# Patient Record
Sex: Male | Born: 1950 | Race: White | Hispanic: No | Marital: Married | Smoking: Former smoker
Health system: Southern US, Community
[De-identification: ages and names within clinical notes are randomized; demographics above are authoritative.]

## PROBLEM LIST (undated history)

## (undated) DIAGNOSIS — K4020 Bilateral inguinal hernia, without obstruction or gangrene, not specified as recurrent: Secondary | ICD-10-CM

## (undated) DIAGNOSIS — I10 Essential (primary) hypertension: Secondary | ICD-10-CM

## (undated) HISTORY — PX: APPENDECTOMY: SHX54

## (undated) HISTORY — PX: DENTAL SURGERY: SHX609

## (undated) HISTORY — DX: Essential (primary) hypertension: I10

---

## 2002-04-02 ENCOUNTER — Emergency Department (HOSPITAL_COMMUNITY): Admission: EM | Admit: 2002-04-02 | Discharge: 2002-04-02 | Payer: Self-pay | Admitting: Emergency Medicine

## 2006-08-28 ENCOUNTER — Ambulatory Visit: Payer: Self-pay | Admitting: Sports Medicine

## 2010-01-25 ENCOUNTER — Ambulatory Visit: Payer: Self-pay | Admitting: Sports Medicine

## 2010-01-25 DIAGNOSIS — K4020 Bilateral inguinal hernia, without obstruction or gangrene, not specified as recurrent: Secondary | ICD-10-CM

## 2010-01-25 DIAGNOSIS — N529 Male erectile dysfunction, unspecified: Secondary | ICD-10-CM

## 2010-10-10 NOTE — Letter (Signed)
Summary: Referral for Dr Ezzard Standing  Referral for Dr Ezzard Standing   Imported By: Marily Memos 01/26/2010 09:05:02  _____________________________________________________________________  External Attachment:    Type:   Image     Comment:   External Document

## 2010-10-10 NOTE — Letter (Signed)
Summary: *Referral Letter  [facility]  [address]   [place], [place] [address]   Phone: [phone]  Fax: [phone]    01/25/2010 Dr. Ovidio Kin [facility] fax: [phone]   Dear Onalee Hua:  Thank you in advance for agreeing to see my patient:  Bryan Harmon [address] [place], [place]  [address]  Phone: [phone]  Reason for Referral: pleasant gentleman with chronic bilateral inguinal hernias that are starting to bother him with exercise.  Runs marathons.  Procedures Requested: I think he is a good surgical candidate and wish your opinion.  Current Medical Problems: 1)  ERECTILE DYSFUNCTION, ORGANIC (ICD-607.84) 2)  INGUINAL HERNIA, RIGHT (ICD-550.90) 3)  INGUINAL HERNIA, LEFT (ICD-550.90)   Current Medications: 1)  VIAGRA 50 MG TABS (SILDENAFIL CITRATE) 1 by mouth   Thank you again for agreeing to see our patient; please contact us if you have any further questions or need additional information.  Sincerely,  Vincent Gros MD

## 2010-10-10 NOTE — Assessment & Plan Note (Signed)
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 for achilles tendon probs and did well  has had bilateral hernias at least 10 years hernias bother him w some activity running now less than in past - was over 50 mpw now 20 to 30 mpw still doing marathons  no pain w tennis  biking no real pain  also concerned about ED tuis has ben worse in recent years no medical problems that affect this no prescription meds  Allergies (verified): No Known Drug Allergies  Physical Exam  General:  Well-developed,well-nourished,in no acute distress; alert,appropriate and cooperative throughout examination Abdomen:  he has bilat inguinal hernias these bulge to level of inguinal ring they reduce spontaneously with lying down but are fairly prominent  testicles normal and no swelling into scrotum Msk:  Both Achilles tendons are now without nodules   Impression & Recommendations:  Problem # 1:  INGUINAL HERNIA, RIGHT (ICD-550.90) not painful but now problematic with increased running and sports activity  Problem # 2:  INGUINAL HERNIA, LEFT (ICD-550.90) this one is larger and more symptoms if too much activity  will refer for surgical eval to Dr Ezzard Standing  Problem # 3:  ERECTILE DYSFUNCTION, ORGANIC (ICD-607.84)  His updated medication list for this problem includes:    Viagra 50 Mg Tabs (Sildenafil citrate) .Marland Kitchen... 1 by mouth   I think this is consistent w aging probably not related to his hernia issues  OK to try viagra  Complete Medication List: 1)  Viagra 50 Mg Tabs (Sildenafil citrate) .Marland Kitchen.. 1 by mouth Prescriptions: VIAGRA 50 MG TABS (SILDENAFIL CITRATE) 1 by mouth  #6 x 11   Entered and Authorized  by:   Enid Baas MD   Signed by:   Enid Baas MD on 01/25/2010   Method used:   Electronically to        [facility] [address] (retail)       [address]       [place], [place]  [address]       Ph: [phone] or [phone]       Fax: [phone]   RxID:   [id number]   Appended Document: Lyndal Rainbow with Dr Ezzard Standing is 6/22 @ 10:55,must arrive by 10:30 [facility]  [phone] Mssg left for pt

## 2011-02-03 ENCOUNTER — Emergency Department (HOSPITAL_COMMUNITY)
Admission: EM | Admit: 2011-02-03 | Discharge: 2011-02-03 | Discharge status: Against Medical Advice | Payer: 59 | Attending: Emergency Medicine | Admitting: Emergency Medicine

## 2011-02-03 DIAGNOSIS — S81009A Unspecified open wound, unspecified knee, initial encounter: Secondary | ICD-10-CM | POA: Insufficient documentation

## 2011-02-03 DIAGNOSIS — W298XXA Contact with other powered powered hand tools and household machinery, initial encounter: Secondary | ICD-10-CM | POA: Insufficient documentation

## 2011-02-03 DIAGNOSIS — S91009A Unspecified open wound, unspecified ankle, initial encounter: Secondary | ICD-10-CM | POA: Insufficient documentation

## 2011-04-15 ENCOUNTER — Other Ambulatory Visit: Payer: Self-pay | Admitting: Sports Medicine

## 2012-01-23 ENCOUNTER — Other Ambulatory Visit: Payer: Self-pay | Admitting: Sports Medicine

## 2012-02-27 ENCOUNTER — Encounter: Payer: Self-pay | Admitting: Family Medicine

## 2012-02-27 ENCOUNTER — Ambulatory Visit (INDEPENDENT_AMBULATORY_CARE_PROVIDER_SITE_OTHER): Payer: 59 | Admitting: Family Medicine

## 2012-02-27 VITALS — BP 220/114 | HR 52 | Ht 70.5 in | Wt 198.0 lb

## 2012-02-27 DIAGNOSIS — I10 Essential (primary) hypertension: Secondary | ICD-10-CM

## 2012-02-27 DIAGNOSIS — K4090 Unilateral inguinal hernia, without obstruction or gangrene, not specified as recurrent: Secondary | ICD-10-CM

## 2012-02-27 DIAGNOSIS — N529 Male erectile dysfunction, unspecified: Secondary | ICD-10-CM

## 2012-02-27 MED ORDER — SILDENAFIL CITRATE 100 MG PO TABS: 50.0000 mg | ORAL_TABLET | ORAL | Status: DC | PRN

## 2012-02-27 NOTE — Progress Notes (Signed)
Chief Complaint  Patient presents with  . Groin Pain    patient states that he has an inguinal hernia and does not it repaired just looking for an alternative.   HPI: He was seeing Dr. Electa Sniff, diagnosed with bilateral inguinal hernias and was referred to Dr. Ezzard Standing.  Saw him about 2 years ago.  Surgery was recommended, but he declined due to the use of mesh. He doesn't want mesh in his body.  He continues to run, and plays tennis. He is an Midwife. Has some discomfort with standing, and after eating.  No pain during or after running.  Never has "pain" just discomfort. He denies nausea, vomiting, bowel changes, urinary complaints.  ED--trouble maintaining erections.  He has taken Viagra in the past, and is asking for a refill. He states that the 50mg  wasn't all that effective, and asks for the 100mg  tablet.  He has a BP monitor at home, recalls it being "a little high", 145 for average systolic blood pressure when he has checked it.  History reviewed. No pertinent past medical history.  Past Surgical History  Procedure Date  . Appendectomy age 61    History   Social History  . Marital Status: Married    Spouse Name: N/A    Number of Children: 1  . Years of Education: N/A   Occupational History  . physical chemist [employer]   Social History Main Topics  . Smoking status: Former Smoker    Quit date: 09/10/1996  . Smokeless tobacco: Never Used  . Alcohol Use: Yes     1 drink per week.  . Drug Use: No  . Sexually Active: Not on file   Other Topics Concern  . Not on file   Social History Narrative   Married.  Son lives in [place].    Family History  Problem Relation Age of Onset  . Hypertension Maternal Grandmother   . Cancer Neg Hx   . Heart disease Neg Hx     Current outpatient prescriptions:sildenafil (VIAGRA) 100 MG tablet, Take 0.5-1 tablets (50-100 mg total) by mouth as needed for erectile dysfunction., Disp: 6 tablet, Rfl: 5;  DISCONTD:  VIAGRA 50 MG tablet, TAKE ONE TABLET BY MOUTH AS NEEDED, Disp: 6 tablet, Rfl: 5  No Known Allergies  ROS:  Rare headaches.  Denies dizziness, URI symptoms, fever, cough, shortness of breath, edema, nausea, vomiting, diarrhea, skin rash.  Denies chest pain, palpitations.  PHYSICAL EXAM: BP 220/114  Pulse 52  Ht 5' 10.5" (1.791 m)  Wt 198 lb (89.812 kg)  BMI 28.01 kg/m2 220/114 on repeat by MD, RA Well developed, pleasant male, in no distress HEENT:  PERRL, EOMI, conjunctiva clear.   Neck: no lymphadenopathy, thyromegaly or mass.  No carotid bruit Heart: regular rate and rhythm without murmur Lungs: clear bilaterally Back: no spine or CVA tenderness Abdomen: soft, nontender, normal bowel sounds, no organomegaly or mass Extremities: no edema Skin: no rash GU exam: testicles normal, no mass.  In supine position, no swelling/mass. When standing, bilateral hernias, obvious with just standing, easily reducible when supine. Nontender  ASSESSMENT/PLAN: 1. Inguinal hernia without mention of obstruction or gangrene, unilateral or unspecified, (not specified as recurrent)    2. Essential hypertension, benign  CBC with Differential, Lipid panel, Comprehensive metabolic panel, PSA  3. ERECTILE DYSFUNCTION, ORGANIC  sildenafil (VIAGRA) 100 MG tablet   Hernias--discussed that surgery is elective at this point, given lack of strangulation or significant symptoms, but is recommended to be done.  He is concerned about the use of mesh--we discussed reasons for using, and need to discuss his concerns with surgeon.  Advised that there really is no alternative treatment.  Educated regarding signs/symptoms of emergency/strangulation.  High blood pressure. Much higher today than what he reports at home. Advised to check blood pressures at home, write down on a piece of paper, and bring paper AND blood pressure monitor to follow-up visit.   Low sodium diet handout given.  ED--discussed potential increase in  BP with physical/sexual activity, and to monitor his BP prior to use, avoid if BP is as high as it is today. May try at just 1/2 tablet, increasing to full tablet only if poor response to 50mg   Encouraged him to return in 2-3 weeks to f/u on blood pressure, and to get fasting labs prior.  It is recommended that he set up CPE, given his lack of medical care in recent years.

## 2012-02-27 NOTE — Patient Instructions (Addendum)
Check blood pressures at home, write down on a piece of paper, and bring paper AND blood pressure monitor to follow-up visit.   Low sodium diet  2 Gram Low Sodium Diet A 2 gram sodium diet restricts the amount of sodium in the diet to no more than 2 g or 2000 mg daily. Limiting the amount of sodium is often used to help lower blood pressure. It is important if you have heart, liver, or kidney problems. Many foods contain sodium for flavor and sometimes as a preservative. When the amount of sodium in a diet needs to be low, it is important to know what to look for when choosing foods and drinks. The following includes some information and guidelines to help make it easier for you to adapt to a low sodium diet. QUICK TIPS  Do not add salt to food.   Avoid convenience items and fast food.   Choose unsalted snack foods.   Buy lower sodium products, often labeled as "lower sodium" or "no salt added."   Check food labels to learn how much sodium is in 1 serving.   When eating at a restaurant, ask that your food be prepared with less salt or none, if possible.  READING FOOD LABELS FOR SODIUM INFORMATION The nutrition facts label is a good place to find how much sodium is in foods. Look for products with no more than 500 to 600 mg of sodium per meal and no more than 150 mg per serving. Remember that 2 g = 2000 mg. The food label may also list foods as:  Sodium-free: Less than 5 mg in a serving.   Very low sodium: 35 mg or less in a serving.   Low-sodium: 140 mg or less in a serving.   Light in sodium: 50% less sodium in a serving. For example, if a food that usually has 300 mg of sodium is changed to become light in sodium, it will have 150 mg of sodium.   Reduced sodium: 25% less sodium in a serving. For example, if a food that usually has 400 mg of sodium is changed to reduced sodium, it will have 300 mg of sodium.  CHOOSING FOODS Grains  Avoid: Salted crackers and snack items. Some  cereals, including instant hot cereals. Bread stuffing and biscuit mixes. Seasoned rice or pasta mixes.   Choose: Unsalted snack items. Low-sodium cereals, oats, puffed wheat and rice, shredded wheat. English muffins and bread. Pasta.  Meats  Avoid: Salted, canned, smoked, spiced, pickled meats, including fish and poultry. Bacon, ham, sausage, cold cuts, hot dogs, anchovies.   Choose: Low-sodium canned tuna and salmon. Fresh or frozen meat, poultry, and fish.  Dairy  Avoid: Processed cheese and spreads. Cottage cheese. Buttermilk and condensed milk. Regular cheese.   Choose: Milk. Low-sodium cottage cheese. Yogurt. Sour cream. Low-sodium cheese.  Fruits and Vegetables  Avoid: Regular canned vegetables. Regular canned tomato sauce and paste. Frozen vegetables in sauces. Olives. Rosita Fire. Relishes. Sauerkraut.   Choose: Low-sodium canned vegetables. Low-sodium tomato sauce and paste. Frozen or fresh vegetables. Fresh and frozen fruit.  Condiments  Avoid: Canned and packaged gravies. Worcestershire sauce. Tartar sauce. Barbecue sauce. Soy sauce. Steak sauce. Ketchup. Onion, garlic, and table salt. Meat flavorings and tenderizers.   Choose: Fresh and dried herbs and spices. Low-sodium varieties of mustard and ketchup. Lemon juice. Tabasco sauce. Horseradish.  SAMPLE 2 GRAM SODIUM MEAL PLAN Breakfast / Sodium (mg)  1 cup low-fat milk / 143 mg   2 slices whole-wheat  toast / 270 mg   1 tbs heart-healthy margarine / 153 mg   1 hard-boiled egg / 139 mg   1 small orange / 0 mg  Lunch / Sodium (mg)  1 cup raw carrots / 76 mg    cup hummus / 298 mg   1 cup low-fat milk / 143 mg    cup red grapes / 2 mg   1 whole-wheat pita bread / 356 mg  Dinner / Sodium (mg)  1 cup whole-wheat pasta / 2 mg   1 cup low-sodium tomato sauce / 73 mg   3 oz lean ground beef / 57 mg   1 small side salad (1 cup raw spinach leaves,  cup cucumber,  cup yellow bell pepper) with 1 tsp olive oil and  1 tsp red wine vinegar / 25 mg  Snack / Sodium (mg)  1 container low-fat vanilla yogurt / 107 mg   3 graham cracker squares / 127 mg  Nutrient Analysis  Calories: 2033   Protein: 77 g   Carbohydrate: 282 g   Fat: 72 g   Sodium: 1971 mg  Document Released: 08/27/2005 Document Revised: 08/16/2011 Document Reviewed: 11/28/2009 [facility] Patient Information 2012 [place], [place].  Do not use the Viagra if your blood pressure remains as high as it was at visit today--okay if 140's systolic.

## 2012-03-25 ENCOUNTER — Other Ambulatory Visit: Payer: 59

## 2012-03-25 DIAGNOSIS — I10 Essential (primary) hypertension: Secondary | ICD-10-CM

## 2012-03-26 LAB — COMPREHENSIVE METABOLIC PANEL
ALT: 11 U/L (ref 0–53)
BUN: 17 mg/dL (ref 6–23)
CO2: 28 mEq/L (ref 19–32)
Creat: 1.11 mg/dL (ref 0.50–1.35)
Glucose, Bld: 82 mg/dL (ref 70–99)
Total Bilirubin: 1.2 mg/dL (ref 0.3–1.2)

## 2012-03-26 LAB — CBC WITH DIFFERENTIAL/PLATELET
Basophils Absolute: 0 10*3/uL (ref 0.0–0.1)
HCT: 42.3 % (ref 39.0–52.0)
Hemoglobin: 14.4 g/dL (ref 13.0–17.0)
Lymphocytes Relative: 26 % (ref 12–46)
Monocytes Absolute: 0.3 10*3/uL (ref 0.1–1.0)
Neutro Abs: 2.8 10*3/uL (ref 1.7–7.7)
RDW: 13.4 % (ref 11.5–15.5)
WBC: 4.4 10*3/uL (ref 4.0–10.5)

## 2012-03-26 LAB — PSA: PSA: 0.52 ng/mL (ref <=4.00)

## 2012-03-26 LAB — LIPID PANEL
Cholesterol: 178 mg/dL (ref 0–200)
HDL: 69 mg/dL (ref >39)
LDL Cholesterol: 97 mg/dL (ref 0–99)
Triglycerides: 61 mg/dL (ref <150)

## 2012-04-02 ENCOUNTER — Ambulatory Visit (INDEPENDENT_AMBULATORY_CARE_PROVIDER_SITE_OTHER): Payer: 59 | Admitting: Family Medicine

## 2012-04-02 ENCOUNTER — Encounter: Payer: Self-pay | Admitting: Family Medicine

## 2012-04-02 VITALS — BP 180/116 | HR 60 | Ht 70.5 in | Wt 199.0 lb

## 2012-04-02 DIAGNOSIS — I10 Essential (primary) hypertension: Secondary | ICD-10-CM

## 2012-04-02 MED ORDER — AMLODIPINE BESYLATE 10 MG PO TABS: 10.0000 mg | ORAL_TABLET | Freq: Every day | ORAL | Status: DC

## 2012-04-02 NOTE — Progress Notes (Signed)
Chief Complaint  Patient presents with  . Follow-up    hypertension and labs results.   HPI:  Patient presents to follow up on his elevated blood pressure.  He has been monitoring, and finds that blood pressures will sometimes be "low" 145-150/95-100, after exercise--he runs 3 miles and plays tennis daily, and that's when the blood pressure is the lowest.  In the mornings, BP runs 175/110.  BP is even higher at work, or with any stress. Occasionally gets headaches.  Denies chest pain, pressure or shortness of breath. Denies edema or any neurologic symptoms.   Past Medical History  Diagnosis Date  . Inguinal hernia, left   . Hypertension    Past Surgical History  Procedure Date  . Appendectomy age 20   History   Social History  . Marital Status: Married    Spouse Name: N/A    Number of Children: 1  . Years of Education: N/A   Occupational History  . physical chemist [employer]   Social History Main Topics  . Smoking status: Former Smoker    Quit date: 09/10/1996  . Smokeless tobacco: Never Used  . Alcohol Use: Yes     1 drink per week.  . Drug Use: No  . Sexually Active: Not on file   Other Topics Concern  . Not on file   Social History Narrative   Married.  Son lives in [place]. Ultra-marathoner   Family History  Problem Relation Age of Onset  . Hypertension Maternal Grandmother   . Cancer Neg Hx   . Heart disease Neg Hx    Med:  Viagra prn  No Known Allergies  ROS:  Denies fevers, URI symptoms, edema, chest pain, palpitations, GI complaints, skin lesions, bleeding/bruising, dyspnea or other concerns  PHYSICAL EXAM: BP 180/116  Pulse 60  Ht 5' 10.5" (1.791 m)  Wt 199 lb (90.266 kg)  BMI 28.15 kg/m2 180/116 by MD on RA Well developed, pleasant male, in no distress Neck: no lymphadenopathy, thyromegaly or carotid bruit Heart: regular rate and rhythm without murmur Lungs: clear bilaterally Extremities: no edema, 2+ pulses Skin: no rash Neuro:  alert and oriented.  Cranial nerves grossly intact.  Normal strength, gait  Labs:   Chemistry      Component Value Date/Time   NA 141 03/25/2012 0840   K 4.4 03/25/2012 0840   CL 105 03/25/2012 0840   CO2 28 03/25/2012 0840   BUN 17 03/25/2012 0840   CREATININE 1.11 03/25/2012 0840      Component Value Date/Time   CALCIUM 9.2 03/25/2012 0840   ALKPHOS 60 03/25/2012 0840   AST 20 03/25/2012 0840   ALT 11 03/25/2012 0840   BILITOT 1.2 03/25/2012 0840     Lab Results  Component Value Date   CHOL 178 03/25/2012   HDL 69 03/25/2012   LDLCALC 97 03/25/2012   TRIG 61 03/25/2012   CHOLHDL 2.6 03/25/2012   Lab Results  Component Value Date   PSA 0.52 03/25/2012   Lab Results  Component Value Date   WBC 4.4 03/25/2012   HGB 14.4 03/25/2012   HCT 42.3 03/25/2012   MCV 96.4 03/25/2012   PLT 239 03/25/2012   EKG:  Sinus bradycardia at 54; LVH per voltage.  No acute abnormalities  ASSESSMENT/PLAN: 1. Essential hypertension, benign  amLODipine (NORVASC) 10 MG tablet, PR ELECTROCARDIOGRAM, COMPLETE   Patient was very hesitant about starting medications, and had a lot of questions. Discussed the pathophysiology of HTN and the complications. Discussed  different classes of anti-hypertensives.  I suggested ACEI and amlodipine as possible choices (avoiding beta blocker and other ca channel blockers due to slowing of pulse; diuretics also an option, but definitely won't be enough).  He didn't feel as comfortable about ACEI, so recommended starting with amlodipine.  Given his hesitancy, will start low, and increase (as expected will be needed) if not reaching goal on lower dose.  Start--Amlodipine 10 mg.  Start at 1/2 tablet for 1-2 weeks.  Increase to full tablet if BP remains greater than 140/90.  Continue low sodium diet, regular exercise.  25 minutes, more than 1/2 spent counseling  F/u in 4-6 weeks with BP log, sooner prn

## 2012-04-02 NOTE — Patient Instructions (Addendum)
Amlodipine 10 mg.  Start at 1/2 tablet for 1-2 weeks.  Increase to full tablet if BP remains greater than 140/90.  You will need to take this blood pressure medication daily, long-term.  Continue to follow low sodium diet and get regular exercise. Please keep a log/record of your blood pressure, pulse (and date checked), and any comments (ie--if after running, if stressed, if had a headache or any pain, etc.) and bring this paper to your next visit.  Hypertension As your heart beats, it forces blood through your arteries. This force is your blood pressure. If the pressure is too high, it is called hypertension (HTN) or high blood pressure. HTN is dangerous because you may have it and not know it. High blood pressure may mean that your heart has to work harder to pump blood. Your arteries may be narrow or stiff. The extra work puts you at risk for heart disease, stroke, and other problems.  Blood pressure consists of two numbers, a higher number over a lower, 110/72, for example. It is stated as "110 over 72." The ideal is below 120 for the top number (systolic) and under 80 for the bottom (diastolic). Write down your blood pressure today. You should pay close attention to your blood pressure if you have certain conditions such as:  Heart failure.   Prior heart attack.   Diabetes   Chronic kidney disease.   Prior stroke.   Multiple risk factors for heart disease.  To see if you have HTN, your blood pressure should be measured while you are seated with your arm held at the level of the heart. It should be measured at least twice. A one-time elevated blood pressure reading (especially in the Emergency Department) does not mean that you need treatment. There may be conditions in which the blood pressure is different between your right and left arms. It is important to see your caregiver soon for a recheck. Most people have essential hypertension which means that there is not a specific cause. This  type of high blood pressure may be lowered by changing lifestyle factors such as:  Stress.   Smoking.   Lack of exercise.   Excessive weight.   Drug/tobacco/alcohol use.   Eating less salt.  Most people do not have symptoms from high blood pressure until it has caused damage to the body. Effective treatment can often prevent, delay or reduce that damage. TREATMENT  When a cause has been identified, treatment for high blood pressure is directed at the cause. There are a large number of medications to treat HTN. These fall into several categories, and your caregiver will help you select the medicines that are best for you. Medications may have side effects. You should review side effects with your caregiver. If your blood pressure stays high after you have made lifestyle changes or started on medicines,   Your medication(s) may need to be changed.   Other problems may need to be addressed.   Be certain you understand your prescriptions, and know how and when to take your medicine.   Be sure to follow up with your caregiver within the time frame advised (usually within two weeks) to have your blood pressure rechecked and to review your medications.   If you are taking more than one medicine to lower your blood pressure, make sure you know how and at what times they should be taken. Taking two medicines at the same time can result in blood pressure that is too low.  SEEK IMMEDIATE MEDICAL CARE IF:  You develop a severe headache, blurred or changing vision, or confusion.   You have unusual weakness or numbness, or a faint feeling.   You have severe chest or abdominal pain, vomiting, or breathing problems.  MAKE SURE YOU:   Understand these instructions.   Will watch your condition.   Will get help right away if you are not doing well or get worse.  Document Released: 08/27/2005 Document Revised: 08/16/2011 Document Reviewed: 04/16/2008 [facility] Patient Information 2012  [place], [place].     Chemistry      Component Value Date/Time   NA 141 03/25/2012 0840   K 4.4 03/25/2012 0840   CL 105 03/25/2012 0840   CO2 28 03/25/2012 0840   BUN 17 03/25/2012 0840   CREATININE 1.11 03/25/2012 0840      Component Value Date/Time   CALCIUM 9.2 03/25/2012 0840   ALKPHOS 60 03/25/2012 0840   AST 20 03/25/2012 0840   ALT 11 03/25/2012 0840   BILITOT 1.2 03/25/2012 0840    Glucose (sugar) 82 (normal is <100)  Lab Results  Component Value Date   CHOL 178 03/25/2012   HDL 69 03/25/2012   LDLCALC 97 03/25/2012   TRIG 61 03/25/2012   CHOLHDL 2.6 03/25/2012   Lab Results  Component Value Date   PSA 0.52 03/25/2012   Lab Results  Component Value Date   WBC 4.4 03/25/2012   HGB 14.4 03/25/2012   HCT 42.3 03/25/2012   MCV 96.4 03/25/2012   PLT 239 03/25/2012

## 2012-10-08 ENCOUNTER — Emergency Department (HOSPITAL_COMMUNITY): Payer: 59

## 2012-10-08 ENCOUNTER — Encounter (HOSPITAL_COMMUNITY): Payer: Self-pay

## 2012-10-08 ENCOUNTER — Emergency Department (HOSPITAL_COMMUNITY)
Admission: EM | Admit: 2012-10-08 | Discharge: 2012-10-08 | Disposition: A | Discharge status: Home / Self Care | Payer: 59 | Attending: Emergency Medicine | Admitting: Emergency Medicine

## 2012-10-08 DIAGNOSIS — R040 Epistaxis: Secondary | ICD-10-CM

## 2012-10-08 DIAGNOSIS — Z8719 Personal history of other diseases of the digestive system: Secondary | ICD-10-CM | POA: Insufficient documentation

## 2012-10-08 DIAGNOSIS — R55 Syncope and collapse: Secondary | ICD-10-CM

## 2012-10-08 DIAGNOSIS — Z87891 Personal history of nicotine dependence: Secondary | ICD-10-CM | POA: Insufficient documentation

## 2012-10-08 DIAGNOSIS — I10 Essential (primary) hypertension: Secondary | ICD-10-CM

## 2012-10-08 LAB — PROTIME-INR
INR: 0.98 (ref 0.00–1.49)
Prothrombin Time: 12.9 seconds (ref 11.6–15.2)

## 2012-10-08 LAB — CBC WITH DIFFERENTIAL/PLATELET
Hemoglobin: 15.1 g/dL (ref 13.0–17.0)
Lymphocytes Relative: 21 % (ref 12–46)
Lymphs Abs: 1.4 10*3/uL (ref 0.7–4.0)
MCV: 93.3 fL (ref 78.0–100.0)
Neutrophils Relative %: 71 % (ref 43–77)
Platelets: 232 10*3/uL (ref 150–400)
RBC: 4.49 MIL/uL (ref 4.22–5.81)
WBC: 6.8 10*3/uL (ref 4.0–10.5)

## 2012-10-08 LAB — COMPREHENSIVE METABOLIC PANEL
Albumin: 3.9 g/dL (ref 3.5–5.2)
BUN: 20 mg/dL (ref 6–23)
Creatinine, Ser: 1.11 mg/dL (ref 0.50–1.35)
Total Protein: 7 g/dL (ref 6.0–8.3)

## 2012-10-08 LAB — TROPONIN I: Troponin I: <0.3 ng/mL (ref <0.30)

## 2012-10-08 MED ORDER — CEPHALEXIN 500 MG PO CAPS: 500.0000 mg | ORAL_CAPSULE | Freq: Four times a day (QID) | ORAL | Status: DC

## 2012-10-08 MED ORDER — PHENYLEPHRINE HCL 0.5 % NA SOLN
1.0000 [drp] | Freq: Once | NASAL | Status: DC
  Filled 2012-10-08: qty 15

## 2012-10-08 MED ORDER — METOPROLOL TARTRATE 1 MG/ML IV SOLN
10.0000 mg | Freq: Once | INTRAVENOUS | Status: AC
  Administered 2012-10-08: 5 mg via INTRAVENOUS
  Filled 2012-10-08: qty 10

## 2012-10-08 NOTE — ED Notes (Addendum)
Pt here for nose bleed for past two hours. Denies being on any blood thinners. States he thinks he sneezed too much. Pt states he dizzy. Has a hx of high BP

## 2012-10-08 NOTE — ED Notes (Signed)
Lopressor was not administered due to the patient's HR dropping below 60.

## 2012-10-08 NOTE — ED Provider Notes (Signed)
History     CSN: [id number]  Arrival date & time 10/08/12  1726   First MD Initiated Contact with Patient 10/08/12 1740      Chief Complaint  Patient presents with  . Epistaxis    (Consider location/radiation/quality/duration/timing/severity/associated sxs/prior treatment) The history is provided by the patient, medical records and the spouse.    Bryan Harmon is a 62 y.o. male  with a hx of hypertension presents to the Emergency Department complaining of gradual, persistent, progressively worsening epistaxis onset 2 hours prior to arrival.  Patient states his nose began bleeding approximately 2 hours before presenting to the emergency department and he could not get it to stop.  Patient has a history of hypertension for which he does not take medications. States he is a runner and in good health.  Associated symptoms include syncopal episode.  Nothing makes it better and nothing makes it worse.  Pt denies fever, chills, headache, neck pain, chest pain or shortness of breath, abdominal pain, nausea, vomiting, diarrhea, weakness, dizziness, dysuria, hematuria.  HPI limited slightly by pt acuity.     Past Medical History  Diagnosis Date  . Inguinal hernia, left   . Hypertension     Past Surgical History  Procedure Date  . Appendectomy age 89    Family History  Problem Relation Age of Onset  . Hypertension Maternal Grandmother   . Cancer Neg Hx   . Heart disease Neg Hx     History  Substance Use Topics  . Smoking status: Former Smoker    Quit date: 09/10/1996  . Smokeless tobacco: Never Used  . Alcohol Use: Yes     Comment: 1 drink per week.      Review of Systems  Constitutional: Negative for fever, diaphoresis, appetite change, fatigue and unexpected weight change.  HENT: Positive for nosebleeds. Negative for mouth sores and neck stiffness.   Eyes: Negative for visual disturbance.  Respiratory: Negative for cough, chest tightness, shortness of breath and  wheezing.   Cardiovascular: Negative for chest pain.  Gastrointestinal: Negative for nausea, vomiting, abdominal pain, diarrhea and constipation.  Genitourinary: Negative for dysuria, urgency, frequency and hematuria.  Musculoskeletal: Negative for back pain.  Skin: Negative for rash.  Neurological: Positive for syncope. Negative for light-headedness and headaches.  Hematological: Does not bruise/bleed easily.  Psychiatric/Behavioral: Negative for sleep disturbance. The patient is not nervous/anxious.   All other systems reviewed and are negative.    Allergies  Review of patient's allergies indicates no known allergies.  Home Medications   No current outpatient prescriptions on file.  BP 177/104  Pulse 55  Temp 97.7 F (36.5 C) (Oral)  Resp 14  SpO2 96%  Physical Exam  Nursing note and vitals reviewed. Constitutional: He is oriented to person, place, and time. He appears well-developed and well-nourished. He appears distressed.  HENT:  Head: Normocephalic and atraumatic.  Nose: Epistaxis is observed.  No foreign bodies. Right sinus exhibits no maxillary sinus tenderness and no frontal sinus tenderness. Left sinus exhibits no maxillary sinus tenderness and no frontal sinus tenderness.  Mouth/Throat: Uvula is midline, oropharynx is clear and moist and mucous membranes are normal. No oropharyngeal exudate.       Pt with R nare septal bleed, likely arterial in nature Posterior oropharynx with blood  Eyes: Conjunctivae normal and EOM are normal. Pupils are equal, round, and reactive to light. No scleral icterus.  Neck: Normal range of motion and full passive range of motion without pain. Neck supple. No spinous  process tenderness and no muscular tenderness present. Normal range of motion present.  Cardiovascular: Normal rate, normal heart sounds and intact distal pulses.  An irregular rhythm present.  Pulses:      Radial pulses are 2+ on the right side, and 2+ on the left side.        Dorsalis pedis pulses are 2+ on the right side, and 2+ on the left side.       Posterior tibial pulses are 2+ on the right side, and 2+ on the left side.  Pulmonary/Chest: Effort normal and breath sounds normal. No respiratory distress. He has no wheezes.  Abdominal: Soft. Bowel sounds are normal. He exhibits no mass. There is no tenderness. There is no rebound and no guarding.  Musculoskeletal: Normal range of motion. He exhibits no edema and no tenderness.  Lymphadenopathy:    He has no cervical adenopathy.  Neurological: He is alert and oriented to person, place, and time. He exhibits normal muscle tone. Coordination normal.       Speech is clear and goal oriented Moves extremities without ataxia  Skin: Skin is warm. He is diaphoretic. There is pallor.  Psychiatric: He has a normal mood and affect.    ED Course  EPISTAXIS MANAGEMENT Date/Time: 10/08/2012 6:00 PM Performed by: Dierdre Forth Authorized by: Dierdre Forth Consent: Verbal consent obtained. Risks and benefits: risks, benefits and alternatives were discussed Consent given by: patient Patient understanding: patient states understanding of the procedure being performed Patient consent: the patient's understanding of the procedure matches consent given Procedure consent: procedure consent matches procedure scheduled Relevant documents: relevant documents present and verified Test results: test results available and properly labeled Required items: required blood products, implants, devices, and special equipment available Patient identity confirmed: verbally with patient Time out: Immediately prior to procedure a "time out" was called to verify the correct patient, procedure, equipment, support staff and site/side marked as required. Preparation: Patient was prepped and draped in the usual sterile fashion. Patient sedated: no Treatment site: right anterior Repair method: anterior pack Post-procedure  assessment: bleeding stopped Treatment complexity: complex Recurrence of recent bleed: none. Patient tolerance: Patient tolerated the procedure well with no immediate complications. Comments: Hemostasis initially attempted by Dr Glynn Octave with silver nitrate.  When this was unsuccessful, A rapid rhino was inserted into the R nare and inflated.  Pt tolerated the procedure without immediate complications and no evidence of re-bleed.     (including critical care time)  Labs Reviewed  COMPREHENSIVE METABOLIC PANEL - Abnormal; Notable for the following:    Glucose, Bld 147 (*)     GFR calc non Af Amer 70 (*)     GFR calc Af Amer 82 (*)     All other components within normal limits  CBC WITH DIFFERENTIAL  TROPONIN I  PROTIME-INR   No results found.  ECG:  Date: 10/08/2012  Rate: 69  Rhythm: normal sinus rhythm  QRS Axis: left  Intervals: normal  ST/T Wave abnormalities: nonspecific ST changes  Conduction Disutrbances:none  Narrative Interpretation: 1mm ST elevation in V2, no other ST elevation, depression, or inverted T waves; no old ECG  Old EKG Reviewed: none available   1. Epistaxis   2. Syncopal episodes   3. HTN (hypertension)       MDM  Delia Chimes presents with epistaxis. While in triage patient had a witnessed full syncopal episode with bladder incontinence.  Pt moved into a room, ECG obtained. ECG nonischemic.  Patient with a right nare  septal bleed.  Nitrate attempted without control of bleeding. Rapid Rhino placed with control bleeding.  Patient with second vasovagal episode and near syncope with bradycardia and hypertension. Patient placed in Trendelenburg and blood pressure and heart rate recovered.  Pt Hypertensive to 190s/120s, but vagals to 70s/40s.  Attempted to give labetalol but pt became bradycardic before administration.  Concern for end organ damage 2/2 to HTN,but pt continues to deny CP, changes in vision, shortness of breath.    Troponin  negative, CMP unremarkable, PT/INR within normal limits and CBC unremarkable. Patient does not take any sort of anticoagulation or ASA.  ECG without ischemic changes. Repeat ECG after 2nd near syncope in the department is unchanged and remains without evidence of ST elevation or cardiac ischemia.   7:21PM Pt well appearing, VSS, no diaphoresis.  No further bleeding from the nare or into the pharynx.  I do not believe this is a posterior bleed. Pt remains hypertensive.  Pt requesting discharge home for f/u with PCP.  I discussed the risks of returning home including further syncopal episodes, myocardial infarctions and even death.  Patient is alert and oriented x3 in makes the decisions. Patient states he will followup with primary care physician tomorrow. We'll leave Rhino in place for 24 hours and will give prophylactic abx.  Patient discharged AMA.  Dr. Glynn Octave was consulted, evaluated this patient with me and agrees with the plan.     Date: 10/08/2012 Patient: Bryan Harmon Admitted: 10/08/2012  5:32 PM Attending Provider: Glynn Octave, MD  Delia Chimes or his authorized caregiver has made the decision for the patient to leave the emergency department against the advice of Glynn Octave, MD and [facility] Kierrah Kilbride PA-C.  He or his authorized caregiver has been informed and understands the inherent risks, including death.  He or his authorized caregiver has decided to accept the responsibility for this decision. Delia Chimes and all necessary parties have been advised that he may return for further evaluation or treatment. His condition at time of discharge was Stable.  Delia Chimes had current vital signs as follows:  Blood pressure 183/113, pulse 77, temperature 97.7 F (36.5 C), temperature source Oral, resp. rate 17, SpO2 97.00%.   Delia Chimes or his authorized caregiver has signed the Leaving Against Medical Advice form prior to leaving the  department.  Lavelle Berland, Dahlia Client 10/08/2012   1. Medications: keflex, usual home medications 2. Treatment: rest, drink plenty of fluids, take medications as prescribed 3. Follow Up: Please followup with your primary doctor for discussion of your diagnoses and further evaluation after today's visit; your Rhino will need to be taken out tomorrow as 24 hours. He may return here to the emergency department for this or see her primary care physician; regardless you must followup with your primary doctor about your blood pressure; return to the emergency department if you have another episode  Where you pass out or your nose begins to bleed again.    Dierdre Forth, PA-C 10/08/12 1944  Dierdre Forth, PA-C 10/08/12 2337

## 2012-10-08 NOTE — ED Provider Notes (Signed)
This chart was scribed for Glynn Octave, MD by Shari Heritage, ED Scribe. The patient was seen in room A04C/A04C. Patient's care was started at 1726.  Bryan Harmon is a 61 y.o. male complaining of epistaxis onset 3-4 hours ago. While being triaged, patient had a syncopal episode and bladder incontinence. Patient has a history of hypertension, but denies using anti-coagulants at this time.  Physical Exam: Dried blood bilateral nares. Oozing from R nasal septum. Blood in oropharynx.   Patient had syncopal episode in triage and again in the exam room with transieny bradycardia and hypotension.  Suspect vasovagal response.  EKG nonischemic. Epistaxis controlled with cautery and anterior packing.  Hemoglobin stable.  Blood pressure remains uncontrolled in ED.  Lopressor not given 2/2 bradycardia.  Given multiple syncopal episodes with bradycardia and uncontrolled blood pressure, admission recommended.  Patient refuses and insists on going home.  He is capable of making his own decisions. He understands risk of arrhythmia, MI, stroke, kidney failure, recurrent syncope and death. He will leave against medical advice.  I personally performed the services described in this documentation, which was scribed in my presence. The recorded information has been reviewed and is accurate.    Glynn Octave, MD 10/09/12 [phone]

## 2012-10-08 NOTE — ED Notes (Signed)
Patient became pale, diaphoretic and complained of feeling nauseated. Head of bed was lowered, cool rag applied and oxygen blow by with a NRB. Patient responded well.

## 2012-10-08 NOTE — ED Notes (Signed)
Pt's rhino rocket seemed more saturated than it did a few minutes ago.  I asked ED PA Dahlia Client to come evaluate and she okayed the pt to go home.  Instructed the pt to come back if blood starts dripping around the package.  Pt verbalized understanding.

## 2012-10-08 NOTE — ED Notes (Signed)
Pt sitting in triage chair stating "I'm going to pass out". Pressure applied to nose to stop bleeding and pt loses consciousness briefly. This RN stayed at patient's side to keep him from falling from the chair until other staff were available to assist. Stretcher brought from room for patient. Dr. Manus Gunning up to triage to briefly assess pt as well.

## 2012-10-08 NOTE — ED Notes (Signed)
Patient arrived in room from triage area on stretcher. Patient presented with a nose bleed that started spontaneously after he sneezed. He had a vagal response where he became diaphoretic, pale and passed out. He also became incontinent of urine. Patient was immediately placed on EKG monitor, BP and Spo2 applied. Patient is very hypertensive and he states he has very high BP and low HR but has never been seen by a Physician.

## 2012-10-09 ENCOUNTER — Emergency Department (HOSPITAL_COMMUNITY)
Admission: EM | Admit: 2012-10-09 | Discharge: 2012-10-09 | Disposition: A | Discharge status: Home / Self Care | Payer: 59 | Attending: Emergency Medicine | Admitting: Emergency Medicine

## 2012-10-09 ENCOUNTER — Encounter: Payer: Self-pay | Admitting: Family Medicine

## 2012-10-09 ENCOUNTER — Ambulatory Visit (INDEPENDENT_AMBULATORY_CARE_PROVIDER_SITE_OTHER): Payer: 59 | Admitting: Family Medicine

## 2012-10-09 ENCOUNTER — Encounter (HOSPITAL_COMMUNITY): Payer: Self-pay

## 2012-10-09 VITALS — BP 150/118 | HR 80 | Temp 98.4°F | Ht 70.5 in | Wt 196.0 lb

## 2012-10-09 DIAGNOSIS — R55 Syncope and collapse: Secondary | ICD-10-CM

## 2012-10-09 DIAGNOSIS — Z79899 Other long term (current) drug therapy: Secondary | ICD-10-CM | POA: Insufficient documentation

## 2012-10-09 DIAGNOSIS — Z8719 Personal history of other diseases of the digestive system: Secondary | ICD-10-CM | POA: Insufficient documentation

## 2012-10-09 DIAGNOSIS — R040 Epistaxis: Secondary | ICD-10-CM

## 2012-10-09 DIAGNOSIS — I10 Essential (primary) hypertension: Secondary | ICD-10-CM | POA: Insufficient documentation

## 2012-10-09 DIAGNOSIS — Z87891 Personal history of nicotine dependence: Secondary | ICD-10-CM | POA: Insufficient documentation

## 2012-10-09 LAB — POCT HEMOGLOBIN: Hemoglobin: 15 g/dL (ref 14.1–18.1)

## 2012-10-09 NOTE — ED Provider Notes (Signed)
Medical screening examination/treatment/procedure(s) were conducted as a shared visit with non-physician practitioner(s) and myself.  I personally evaluated the patient during the encounter  Patient left AMA. See my additional note.  Glynn Octave, MD 10/09/12 [phone]

## 2012-10-09 NOTE — ED Notes (Signed)
Pt presents for nasal packing to be removed.  Pt reports balloon was inserted here to R nare yesterday here.  Pt denies any bleeding since packing inserted, reports a grayish drainage from nose.

## 2012-10-09 NOTE — ED Notes (Signed)
No bleeding after Rhino rocket removed.

## 2012-10-09 NOTE — Progress Notes (Signed)
Chief Complaint  Patient presents with  . Follow-up    hospital follow up.   Didn't sleep at all due to discomfort of nasal packing.  He feels weak.  Denies feeling light-headed or dizzy.  Eating and drinking okay today.  Denies any further nasal bleeding. No nausea or vomiting.  Feels a little dehydrated.  Has a slight headache today related to dehydration, and not sleeping well.    BP 159/99 at home this morning.  Took amlodipine last night and this morning.  Originally rx'd amlodipine back in July .  Admits to taking only 1 pill when it was first prescribed, stopped because he was feeling fine, never had side effects.  Started it again after BP was very in in ER.  He had 2 syncopal events while in ER--ER doctors wanted to keep him for observation, but he left AMA.  He reports being anxious, and in distress related to being supine with blood draining down the back of his throat while in ER last night. He was told to f/u with PCP on his blood pressure and for removal of nasal packing.   Past Medical History  Diagnosis Date  . Inguinal hernia, left   . Hypertension    Past Surgical History  Procedure Date  . Appendectomy age 5   History   Social History  . Marital Status: Married    Spouse Name: N/A    Number of Children: 1  . Years of Education: N/A   Occupational History  . physical chemist [employer]   Social History Main Topics  . Smoking status: Former Smoker    Quit date: 09/10/1996  . Smokeless tobacco: Never Used  . Alcohol Use: Yes     Comment: 1 drink per week.  . Drug Use: No  . Sexually Active: Not on file   Other Topics Concern  . Not on file   Social History Narrative   Married.  Son lives in [place]. Ultra-marathoner   Meds: amlodipine x 2 doses (last night and this morning only)  No Known Allergies  ROS:  Denies fevers, sinus pain, sore throat.  No numbness/tingling/weakness, no fevers, no edema. +discomfort from nasal packing, feeling a little  weak.  No chest pain, neurologic symptoms, vertigo, lightheadedness.  No nausea or vomiting.  PHYSICAL EXAM: BP 150/118  Pulse 80  Temp 98.4 F (36.9 C) (Oral)  Ht 5' 10.5" (1.791 m)  Wt 196 lb (88.905 kg)  BMI 27.73 kg/m2 156/96 Well developed male, with packing in R nares, catheter taped to face.  He is in no distress, although needed to lie down a couple of times during the visit (not feeling well). HEENT:  Conjunctiva clear.  R nares with packing and catheter taped to face.  No bleeding noted. OP clear Neck: no lymphadenopathy or mass Heart: regular rate and rhythm without murmur Lungs: clear bilaterally Extremities: no edema  ASSESSMENT/PLAN:  1. Essential hypertension, benign  Ambulatory referral to Cardiology  2. Epistaxis  Hemoglobin  3. Syncope  Ambulatory referral to Cardiology    Refer to cardiology--echocardiogram recommended.  Expect BP to gradually come down with amlodipine over the  Next 1-2 weeks.  Reinforced need for compliance with medication.  Reviewed risks of untreated hypertension at length.  We called over to [facility] who reported they wouldn't remove the rhino packing before 5-7 days.  We also checked with Dr. Allayne Stack office, who wouldn't remove prior to 3 days.  I advised pt of these recommendations, and pt  was quite upset--wants it removed today.  We discussed reasons for leaving in for another few days, which would also allow his BP to come down some.  Discussed risks of rebleeding.  Advised that I would not remove packing, due the fact that I wouldn't be able to stop it if it started bleeding again.  Advised that if he isn't willing to wait to see ENT, he could go back to ER for packing removal. I suggested waiting another 2 days.    (he ended up going back to ER later in the day to have packing removed--see other note).  F/u here in 2 weeks on blood pressure

## 2012-10-09 NOTE — ED Provider Notes (Signed)
History     CSN: [id number]  Arrival date & time 10/09/12  1302   First MD Initiated Contact with Patient 10/09/12 1330      No chief complaint on file.   (Consider location/radiation/quality/duration/timing/severity/associated sxs/prior treatment) HPI Bryan Harmon is a 62 y.o. male who presents with a complaint of packing removal. States was seen yesterday for nose bleeds. States had packing placed. Was told needs to come out today. Pt states went to his PCP, was told that they cannot remove packing. Pt with elevated BP yesterday and what seems a vasovagal episode yesterday. Was asked to be admitted to obs but left AMA. Pt went and followed up with PCP today, BP better. Pt having no symptoms. Denies chest pain, SOB, headache.    Past Medical History  Diagnosis Date  . Inguinal hernia, left   . Hypertension     Past Surgical History  Procedure Date  . Appendectomy age 17    Family History  Problem Relation Age of Onset  . Hypertension Maternal Grandmother   . Cancer Neg Hx   . Heart disease Neg Hx     History  Substance Use Topics  . Smoking status: Former Smoker    Quit date: 09/10/1996  . Smokeless tobacco: Never Used  . Alcohol Use: Yes     Comment: 1 drink per week.      Review of Systems  Constitutional: Negative for fever and chills.  HENT: Positive for nosebleeds.   Respiratory: Negative.   Cardiovascular: Negative.   Gastrointestinal: Negative.   Neurological: Negative for dizziness, weakness, light-headedness and headaches.    Allergies  Review of patient's allergies indicates no known allergies.  Home Medications   Current Outpatient Rx  Name  Route  Sig  Dispense  Refill  . AMLODIPINE BESYLATE 10 MG PO TABS   Oral   Take 10 mg by mouth daily.         . CEPHALEXIN 500 MG PO CAPS   Oral   Take 1 capsule (500 mg total) by mouth 4 (four) times daily.   40 capsule   0     BP 148/96  Pulse 74  Temp 98.5 F (36.9 C) (Oral)   Resp 18  SpO2 100%  Physical Exam  Nursing note reviewed. Constitutional: He appears well-developed and well-nourished. No distress.  HENT:       Packing in right nostril. Hemostatic.   Eyes: Conjunctivae normal are normal.  Neck: Neck supple.  Cardiovascular: Normal rate, regular rhythm and normal heart sounds.   Pulmonary/Chest: Effort normal and breath sounds normal. No respiratory distress. He has no wheezes. He has no rales.  Abdominal: Bowel sounds are normal. He exhibits no distension. There is no tenderness. There is no rebound.  Musculoskeletal: He exhibits no edema.  Neurological: He is alert.  Skin: Skin is warm and dry.    ED Course  Procedures (including critical care time)  Labs Reviewed - No data to display Dg Chest Portable 1 View  10/08/2012  *RADIOLOGY REPORT*  Clinical Data: 62 year old male with syncope, epistaxis and hypertension.  PORTABLE CHEST - 1 VIEW  Comparison: None  Findings: Cardiomegaly is identified. This is a low-volume film. There is no evidence of focal airspace disease, pulmonary edema, suspicious pulmonary nodule/mass, pleural effusion, or pneumothorax. No acute bony abnormalities are identified.  IMPRESSION: Cardiomegaly without evidence of acute cardiopulmonary disease.   Original Report Authenticated By: Harmon Pier, M.D.      1. Nasal bleeding  MDM  Pt here for nasal packing removal. He has no other complaints. No headache. No dizziness. No pain anywhere. I removed nasal packing. Pt monitored for an hour. No bleeding. Pt d/c home with pcp and ENT follow up. He does not want any more work up for his htn or yesterday's syncopal episodes, and states it was because he saw blood. Pts bp today is 148/96, will follow up with pcp.         Lottie Mussel, PA 10/09/12 1558

## 2012-10-09 NOTE — Patient Instructions (Addendum)
Continue to take your amlodipine 10mg  every day, once daily. Please monitor your blood pressures at home, write down, and bring your monitor and your list of blood pressures to your next visit. Go to the ER if you develop chest pain, severe headache with any neurologic symptoms (trouble concentrating, talking, weakness) or any further fainting spells.  We are referring you to cardiology for further evaluation, and for echocardiogram.  Per ENT's I spoke with today, it is recommended that the packing stay in at least 3-5 days.  You can go to ER over the weekend (vs waiting to see ENT doctor next week) to have it removed.  That might also give time for the blood pressure medications to take effect before packing is removed.

## 2012-10-10 NOTE — ED Provider Notes (Signed)
Medical screening examination/treatment/procedure(s) were performed by non-physician practitioner and as supervising physician I was immediately available for consultation/collaboration.   Kaiyu Mirabal, MD 10/10/12 0909 

## 2012-10-20 ENCOUNTER — Ambulatory Visit: Payer: 59 | Admitting: Cardiology

## 2012-11-24 ENCOUNTER — Ambulatory Visit (INDEPENDENT_AMBULATORY_CARE_PROVIDER_SITE_OTHER): Payer: 59 | Admitting: Family Medicine

## 2012-11-24 ENCOUNTER — Encounter: Payer: Self-pay | Admitting: Family Medicine

## 2012-11-24 VITALS — BP 138/100 | HR 60 | Temp 97.9°F | Resp 16 | Ht 70.0 in | Wt 202.0 lb

## 2012-11-24 DIAGNOSIS — B351 Tinea unguium: Secondary | ICD-10-CM

## 2012-11-24 DIAGNOSIS — N529 Male erectile dysfunction, unspecified: Secondary | ICD-10-CM

## 2012-11-24 DIAGNOSIS — K4020 Bilateral inguinal hernia, without obstruction or gangrene, not specified as recurrent: Secondary | ICD-10-CM

## 2012-11-24 DIAGNOSIS — I10 Essential (primary) hypertension: Secondary | ICD-10-CM

## 2012-11-24 MED ORDER — SILDENAFIL CITRATE 100 MG PO TABS: 50.0000 mg | ORAL_TABLET | Freq: Every day | ORAL | Status: DC | PRN

## 2012-11-24 NOTE — Progress Notes (Signed)
A 62 year old man from [place] who comes in to review several of his concerns. For small he's been diagnosed with high blood pressure. He never took his prescribed medication however. He has a history of high salt intake and understands that this contributes his blood pressure. He does not want to take any medication at this time. He has had nosebleeds and headache in the past. We're long discussion about the risks of not treating his blood pressure even though he is a very active man and he still declines treatment.  He also has bilateral hernia diagnosed for over 10 years. He's been a long-distance runner has given this up and still plays tennis. He has discomfort when he moves but when he lies down flat the pain was away and the swelling as well. He does not want to have surgery and does have a truss which he has not been using. He wanted to know if there is anything else he could do for this. I explained that surgery was the only remedy that gave permanent resolution at that he could try the thoracic and to see how well at work.  He also complains about thickened crusty nails in his toes. This is been going on for over a year. I said that he would have to take medicine to eliminate the fungus it causes this and he declines to take any medication at this time.  He also complains about persistent gas in his stomach. I suggest he try beano.  In addition he should consider colonoscopy. He declines interventions at this time.  Finally patient has complaints about erectile dysfunction. He would like a refill on his Viagra which is work well for him.  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 He quit cigarettes over 15 years ago. He's very active playing tennis and hopes to resume his long distance running (over 30 miles at a time) in the future. 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  Objective: The pressure is 140/110 in the right arm. Chest:  Clear Heart: Regular no murmur Groin: Bilateral inguinal hernia which is reducible and nontender Extremities: Onychomycosis in large toenails  Assessment: BP 62-year-old man who is very reticent about medical care. The feeling is that I can develop a relationship with him over time the medial to make some progress on the blood pressure and the hernia. At this time is not rate entertain medical care.  Plan:  Erectile dysfunction - Plan: sildenafil (VIAGRA) 100 MG tablet  Follow up recommended.  Patient will consider making appointment.

## 2012-11-24 NOTE — Patient Instructions (Signed)
Ringworm, Nail A fungal infection of the nail (tinea unguium/onychomycosis) is common. It is common as the visible part of the nail is composed of dead cells which have no blood supply to help prevent infection. It occurs because fungi are everywhere and will pick any opportunity to grow on any dead material. Because nails are very slow growing they require up to 2 years of treatment with anti-fungal medications. The entire nail back to the base is infected. This includes approximately  of the nail which you cannot see. If your caregiver has prescribed a medication by mouth, take it every day and as directed. No progress will be seen for at least 6 to 9 months. Do not be disappointed! Because fungi live on dead cells with little or no exposure to blood supply, medication delivery to the infection is slow; thus the cure is slow. It is also why you can observe no progress in the first 6 months. The nail becoming cured is the base of the nail, as it has the blood supply. Topical medication such as creams and ointments are usually not effective. Important in successful treatment of nail fungus is closely following the medication regimen that your doctor prescribes. Sometimes you and your caregiver may elect to speed up this process by surgical removal of all the nails. Even this may still require 6 to 9 months of additional oral medications. See your caregiver as directed. Remember there will be no visible improvement for at least 6 months. See your caregiver sooner if other signs of infection (redness and swelling) develop. Document Released: 08/24/2000 Document Revised: 11/19/2011 Document Reviewed: 11/02/2008 [facility] Patient Information 2013 [place], [place]. Hernia A hernia occurs when an internal organ pushes out through a weak spot in the abdominal wall. Hernias most commonly occur in the groin and around the navel. Hernias often can be pushed back into place (reduced). Most hernias tend to get worse over  time. Some abdominal hernias can get stuck in the opening (irreducible or incarcerated hernia) and cannot be reduced. An irreducible abdominal hernia which is tightly squeezed into the opening is at risk for impaired blood supply (strangulated hernia). A strangulated hernia is a medical emergency. Because of the risk for an irreducible or strangulated hernia, surgery may be recommended to repair a hernia. CAUSES   Heavy lifting.  Prolonged coughing.  Straining to have a bowel movement.  A cut (incision) made during an abdominal surgery. HOME CARE INSTRUCTIONS   Bed rest is not required. You may continue your normal activities.  Avoid lifting more than 10 pounds (4.5 kg) or straining.  Cough gently. If you are a smoker it is best to stop. Even the best hernia repair can break down with the continual strain of coughing. Even if you do not have your hernia repaired, a cough will continue to aggravate the problem.  Do not wear anything tight over your hernia. Do not try to keep it in with an outside bandage or truss. These can damage abdominal contents if they are trapped within the hernia sac.  Eat a normal diet.  Avoid constipation. Straining over long periods of time will increase hernia size and encourage breakdown of repairs. If you cannot do this with diet alone, stool softeners may be used. SEEK IMMEDIATE MEDICAL CARE IF:   You have a fever.  You develop increasing abdominal pain.  You feel nauseous or vomit.  Your hernia is stuck outside the abdomen, looks discolored, feels hard, or is tender.  You have any  changes in your bowel habits or in the hernia that are unusual for you.  You have increased pain or swelling around the hernia.  You cannot push the hernia back in place by applying gentle pressure while lying down. MAKE SURE YOU:   Understand these instructions.  Will watch your condition.  Will get help right away if you are not doing well or get worse. Document  Released: 08/27/2005 Document Revised: 11/19/2011 Document Reviewed: 04/15/2008 [facility] Patient Information 2013 [place], [place]. Hypertension As your heart beats, it forces blood through your arteries. This force is your blood pressure. If the pressure is too high, it is called hypertension (HTN) or high blood pressure. HTN is dangerous because you may have it and not know it. High blood pressure may mean that your heart has to work harder to pump blood. Your arteries may be narrow or stiff. The extra work puts you at risk for heart disease, stroke, and other problems.  Blood pressure consists of two numbers, a higher number over a lower, 110/72, for example. It is stated as "110 over 72." The ideal is below 120 for the top number (systolic) and under 80 for the bottom (diastolic). Write down your blood pressure today. You should pay close attention to your blood pressure if you have certain conditions such as:  Heart failure.  Prior heart attack.  Diabetes  Chronic kidney disease.  Prior stroke.  Multiple risk factors for heart disease. To see if you have HTN, your blood pressure should be measured while you are seated with your arm held at the level of the heart. It should be measured at least twice. A one-time elevated blood pressure reading (especially in the Emergency Department) does not mean that you need treatment. There may be conditions in which the blood pressure is different between your right and left arms. It is important to see your caregiver soon for a recheck. Most people have essential hypertension which means that there is not a specific cause. This type of high blood pressure may be lowered by changing lifestyle factors such as:  Stress.  Smoking.  Lack of exercise.  Excessive weight.  Drug/tobacco/alcohol use.  Eating less salt. Most people do not have symptoms from high blood pressure until it has caused damage to the body. Effective treatment can often  prevent, delay or reduce that damage. TREATMENT  When a cause has been identified, treatment for high blood pressure is directed at the cause. There are a large number of medications to treat HTN. These fall into several categories, and your caregiver will help you select the medicines that are best for you. Medications may have side effects. You should review side effects with your caregiver. If your blood pressure stays high after you have made lifestyle changes or started on medicines,   Your medication(s) may need to be changed.  Other problems may need to be addressed.  Be certain you understand your prescriptions, and know how and when to take your medicine.  Be sure to follow up with your caregiver within the time frame advised (usually within two weeks) to have your blood pressure rechecked and to review your medications.  If you are taking more than one medicine to lower your blood pressure, make sure you know how and at what times they should be taken. Taking two medicines at the same time can result in blood pressure that is too low. SEEK IMMEDIATE MEDICAL CARE IF:  You develop a severe headache, blurred or changing vision,  or confusion.  You have unusual weakness or numbness, or a faint feeling.  You have severe chest or abdominal pain, vomiting, or breathing problems. MAKE SURE YOU:   Understand these instructions.  Will watch your condition.  Will get help right away if you are not doing well or get worse. Document Released: 08/27/2005 Document Revised: 11/19/2011 Document Reviewed: 04/16/2008 [facility] Patient Information 2013 [place], [place].

## 2013-01-15 ENCOUNTER — Telehealth: Payer: Self-pay

## 2013-01-15 NOTE — Telephone Encounter (Signed)
Pt stated was seen by Dr Milus Glazier a while back and given a prescription which he did not go and fill.  Pt now has went and no RX at pharmacy. Pt did not know name of prescription.  Please call pt back to discuss. [phone]

## 2013-01-16 ENCOUNTER — Other Ambulatory Visit: Payer: Self-pay | Admitting: Family Medicine

## 2013-01-16 DIAGNOSIS — N529 Male erectile dysfunction, unspecified: Secondary | ICD-10-CM

## 2013-01-16 MED ORDER — SILDENAFIL CITRATE 100 MG PO TABS: 50.0000 mg | ORAL_TABLET | Freq: Every day | ORAL | Status: DC | PRN

## 2013-01-16 NOTE — Telephone Encounter (Signed)
Perhaps he is referring to the Viagra. He is referring to this medication, wants you to send in for him, please advise.

## 2013-01-16 NOTE — Telephone Encounter (Signed)
Viagra Rx called in

## 2013-01-23 ENCOUNTER — Encounter (INDEPENDENT_AMBULATORY_CARE_PROVIDER_SITE_OTHER): Payer: Self-pay | Admitting: Surgery

## 2013-01-23 ENCOUNTER — Ambulatory Visit (INDEPENDENT_AMBULATORY_CARE_PROVIDER_SITE_OTHER): Payer: 59 | Admitting: Surgery

## 2013-01-23 VITALS — BP 136/86 | HR 59 | Temp 96.5°F | Ht 71.5 in | Wt 201.6 lb

## 2013-01-23 DIAGNOSIS — K4020 Bilateral inguinal hernia, without obstruction or gangrene, not specified as recurrent: Secondary | ICD-10-CM

## 2013-01-23 NOTE — Progress Notes (Signed)
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  He actually runs some but has had these easily visible inguinal hernias for several years. On exam that he would probably be direct hernias since they had not at this point descended into his scrotum. They're soft and reducible but easily visible. In addition he has a supraumbilical ventral hernia.  He has been talking with his sister and [place] and she was recommending a more conventional repair without mesh.  He apparently found me on the Internet and wanted me to be familiar with this case in case he needs to have repair. At the present I think he would like to watch these. I gave him booklets on open and laparoscopic inguinal hernia repairs. I would recommend a laparoscopic hernia repair for the direct defects and possibly a supraumbilical open repair for that hernia.  Past Medical History  Diagnosis Date  . Inguinal hernia, left   . Hypertension     Past Surgical History  Procedure Laterality Date  . Appendectomy  age 9    Current Outpatient Prescriptions  Medication Sig Dispense Refill  . sildenafil (VIAGRA) 100 MG tablet Take 0.5-1 tablets (50-100 mg total) by mouth daily as needed for erectile dysfunction.  10 tablet  6   No current facility-administered medications for this visit.   Review of patient's allergies indicates no known allergies. Family History  Problem Relation Age of Onset  . Hypertension Maternal Grandmother   . Cancer Neg Hx   . Heart disease Neg Hx    Social History:   reports that he quit smoking about 16 years ago. He has never used smokeless tobacco. He reports that  drinks alcohol. He reports that he does not use illicit drugs.   REVIEW OF SYSTEMS - PERTINENT POSITIVES ONLY: Noncontributory  Physical Exam:   Blood pressure 136/86, pulse  59, temperature 96.5 F (35.8 C), temperature source Temporal, height 5' 11.5" (1.816 m), weight 201 lb 9.6 oz (91.445 kg), SpO2 98.00%. Body mass index is 27.73 kg/(m^2).  Gen:  WDWN White male NAD  Neurological: Alert and oriented to person, place, and time. Motor and sensory function is grossly intact  Head: Normocephalic and atraumatic.  Eyes: Conjunctivae are normal. Pupils are equal, round, and reactive to light. No scleral icterus.  Neck: Normal range of motion. Neck supple. No tracheal deviation or thyromegaly present.  Abdomen:  Easily visible hernias as noted. Testes descended and unremarkable. Supraumbilical defect.  LABORATORY RESULTS: No results found for this or any previous visit (from the past 48 hour(s)).  RADIOLOGY RESULTS: No results found.  Problem List: Patient Active Problem List   Diagnosis Date Noted  . Essential hypertension, benign 02/27/2012  . Inguinal hernia without mention of obstruction or gangrene, unilateral or unspecified, (not specified as recurrent) 01/25/2010  . 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 Patient will want to observe these and will, back if they began bothering him more and he is more symptomatic.    Matt B. Daphine Deutscher, MD, [facility] [phone] beeper [phone]  01/23/2013 9:46 AM

## 2013-01-23 NOTE — Patient Instructions (Signed)

## 2013-02-18 ENCOUNTER — Telehealth: Payer: Self-pay

## 2013-02-18 NOTE — Telephone Encounter (Signed)
Patient states he has some questions about another provider. Patient would not get specific. [phone]

## 2013-02-19 NOTE — Telephone Encounter (Signed)
Left for message to return call.

## 2013-02-22 NOTE — Telephone Encounter (Signed)
Left message for return call.

## 2013-02-23 ENCOUNTER — Telehealth: Payer: Self-pay

## 2013-02-23 ENCOUNTER — Other Ambulatory Visit: Payer: Self-pay | Admitting: Family Medicine

## 2013-02-23 DIAGNOSIS — N529 Male erectile dysfunction, unspecified: Secondary | ICD-10-CM

## 2013-02-23 MED ORDER — SILDENAFIL CITRATE 100 MG PO TABS: 50.0000 mg | ORAL_TABLET | Freq: Every day | ORAL | Status: DC | PRN

## 2013-02-23 NOTE — Telephone Encounter (Signed)
Spoke with patient and he would like for you to call him regarding surgeons.  Apparently it is a continuation of the last conversation you had with him?

## 2013-02-23 NOTE — Telephone Encounter (Signed)
PATIENT WOULD LIKE FOR DR L TO CALL HIM I COULD NOT UNDERSTAND HIM SO I DONT KNOW WHY [phone]

## 2013-02-24 NOTE — Telephone Encounter (Signed)
Bryan Harmon contacted pt yesterday --- he had no further questions.

## 2014-04-30 ENCOUNTER — Ambulatory Visit (INDEPENDENT_AMBULATORY_CARE_PROVIDER_SITE_OTHER): Payer: 59 | Admitting: Surgery

## 2014-04-30 ENCOUNTER — Encounter (INDEPENDENT_AMBULATORY_CARE_PROVIDER_SITE_OTHER): Payer: Self-pay | Admitting: Surgery

## 2014-04-30 VITALS — BP 144/86 | HR 66 | Temp 97.6°F | Resp 16 | Ht 72.0 in | Wt 201.0 lb

## 2014-04-30 DIAGNOSIS — K4020 Bilateral inguinal hernia, without obstruction or gangrene, not specified as recurrent: Secondary | ICD-10-CM

## 2014-04-30 NOTE — Progress Notes (Signed)
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  He actually runs some but has had these easily visible inguinal hernias for several years. On exam that he would probably be direct hernias since they had not at this point descended into his scrotum. They're soft and reducible but easily visible. In addition he has a supraumbilical ventral hernia.  He has been talking with his sister and [place] and she was recommending a more conventional repair without mesh.  He apparently found me on the Internet and wanted me to be familiar with this case in case he needs to have repair. At the present I think he would like to watch these. I gave him booklets on open and laparoscopic inguinal hernia repairs. I would recommend a laparoscopic hernia repair for the direct defects and possibly a supraumbilical open repair for that hernia.  These have become more symtomatic and he would like to have them repaired.    Past Medical History  Diagnosis Date  . Inguinal hernia, left   . Hypertension     Past Surgical History  Procedure Laterality Date  . Appendectomy  age 32    Current Outpatient Prescriptions  Medication Sig Dispense Refill  . sildenafil (VIAGRA) 100 MG tablet Take 0.5-1 tablets (50-100 mg total) by mouth daily as needed for erectile dysfunction.  10 tablet  6   No current facility-administered medications for this visit.   Review of patient's allergies indicates no known allergies. Family History  Problem Relation Age of Onset  . Hypertension Maternal Grandmother   . Cancer Neg Hx   . Heart disease Neg Hx    Social History:   reports that he quit smoking about 17 years ago. He has never used smokeless tobacco. He reports that he drinks alcohol. He reports that he does not use illicit drugs.   REVIEW OF SYSTEMS - PERTINENT  POSITIVES ONLY: Noncontributory  Physical Exam:   Blood pressure 144/86, pulse 66, temperature 97.6 F (36.4 C), temperature source Temporal, resp. rate 16, height 6' (1.829 m), weight 201 lb (91.173 kg). Body mass index is 27.25 kg/(m^2).  Gen:  WDWN White male NAD  Neurological: Alert and oriented to person, place, and time. Motor and sensory function is grossly intact  Head: Normocephalic and atraumatic.  Eyes: Conjunctivae are normal. Pupils are equal, round, and reactive to light. No scleral icterus.  Neck: Normal range of motion. Neck supple. No tracheal deviation or thyromegaly present.  Abdomen:  Easily visible hernias as noted. Testes descended and unremarkable. Supraumbilical defect.  LABORATORY RESULTS: No results found for this or any previous visit (from the past 48 hour(s)).  RADIOLOGY RESULTS: No results found.  Problem List: Patient Active Problem List   Diagnosis Date Noted  . Essential hypertension, benign 02/27/2012  . Bilateral inguinal hernia 01/25/2010  . 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 Patient is now symptomatic and he wants them repaired. He had to cancel trip to [place] because he was having pain after playing tennis. I have explained laparoscopic and open inguinal hernia repair with him. He would also like a supraumbilical hernia repaired as well. We'll try to accomplish this laparoscopically. We'll go ahead and treat is scheduled   Matt B. Daphine Deutscher, MD, [facility] [phone] beeper [phone]  04/30/2014 3:27 PM

## 2014-06-10 ENCOUNTER — Ambulatory Visit (INDEPENDENT_AMBULATORY_CARE_PROVIDER_SITE_OTHER): Payer: 59 | Admitting: Surgery

## 2014-06-16 ENCOUNTER — Other Ambulatory Visit: Payer: Self-pay | Admitting: Family Medicine

## 2014-06-22 ENCOUNTER — Ambulatory Visit (INDEPENDENT_AMBULATORY_CARE_PROVIDER_SITE_OTHER): Payer: 59 | Admitting: Family Medicine

## 2014-06-22 VITALS — BP 138/86 | HR 63 | Temp 98.0°F | Resp 16 | Ht 71.25 in | Wt 198.2 lb

## 2014-06-22 DIAGNOSIS — K6289 Other specified diseases of anus and rectum: Secondary | ICD-10-CM

## 2014-06-22 DIAGNOSIS — N521 Erectile dysfunction due to diseases classified elsewhere: Secondary | ICD-10-CM

## 2014-06-22 DIAGNOSIS — K4020 Bilateral inguinal hernia, without obstruction or gangrene, not specified as recurrent: Secondary | ICD-10-CM

## 2014-06-22 MED ORDER — SILDENAFIL CITRATE 100 MG PO TABS: 50.0000 mg | ORAL_TABLET | Freq: Every day | ORAL | Status: DC | PRN

## 2014-06-22 NOTE — Patient Instructions (Signed)
Use Lotrimin for the itchy perianal area and call if this does not solve the problem.

## 2014-06-22 NOTE — Progress Notes (Signed)
This chart was scribed for Elvina SidleKurt Naiah Donahoe, MD by Marica OtterNusrat Rahman, ED Scribe at [facility]. This patient was seen in room Room 5 and the patient's care was started at 9:07 AM.  Patient ID: Bryan Harmon MRN: [id number], DOB: [birth year], 63 y.o. Date of Encounter: 06/22/2014, 9:06 AM  Primary Physician: Elvina SidleLAUENSTEIN,Ciclaly Mulcahey, MD  Chief Complaint  Patient presents with  . Surgery Discussion    Pt. states he wants to discuss his surgery for his hernia.      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 Pt reports he wants details regarding what to expect from his surgery. Pt reports that the last time he had surgery was when he was 63 years old and he had appendectomy with complications.    Med Refill: Pt also needs a refill of his sildenafil.   Rectal Itching: Pt also complains of rectal itching that is worse at night.   Past Medical History  Diagnosis Date  . Inguinal hernia, left   . Hypertension      Home Meds: Prior to Admission medications   Medication Sig Start Date End Date Taking? Authorizing Provider  sildenafil (VIAGRA) 100 MG tablet Take 0.5-1 tablets (50-100 mg total) by mouth daily as needed for erectile dysfunction. 02/23/13  Yes Elvina SidleKurt Shaylynn Nulty, MD    Allergies: No Known Allergies  History   Social History  . Marital Status: Married    Spouse Name: N/A    Number of Children: 1  . Years of Education: N/A   Occupational History  . physical chemist [employer]   Social History Main Topics  . Smoking status: Former Smoker    Quit date: 09/10/1996  . Smokeless tobacco: Never Used  . Alcohol Use: Yes     Comment: 1 drink per week.  . Drug Use: No  . Sexual Activity: Not on file   Other Topics Concern  . Not on file   Social History Narrative   Married.  [place] in [place]. Ultra-marathoner     Review  of Systems: Constitutional: negative for chills, fever, night sweats, weight changes, or fatigue  HEENT: negative for vision changes, hearing loss, congestion, rhinorrhea, ST, epistaxis, or sinus pressure Cardiovascular: negative for chest pain or palpitations Respiratory: negative for hemoptysis, wheezing, shortness of breath, or cough Abdominal: negative for abdominal pain, nausea, vomiting, diarrhea, or constipation Dermatological: negative for rash Neurologic: negative for headache, dizziness, or syncope Skin: Rectal itching  All other systems reviewed and are otherwise negative with the exception to those above and in the HPI.   Physical Exam: Blood pressure 138/86, pulse 63, temperature 98 F (36.7 C), temperature source Oral, resp. rate 16, height 5' 11.25" (1.81 m), weight 198 lb 3.2 oz (89.903 kg), SpO2 98.00%., Body mass index is 27.44 kg/(m^2). General: Well developed, well nourished, in no acute distress. Head: Normocephalic, atraumatic, eyes without discharge, sclera non-icteric, nares are without discharge. Bilateral auditory canals clear, TM's are without perforation, pearly grey and translucent with reflective cone of light bilaterally. Oral cavity moist, posterior pharynx without exudate, erythema, peritonsillar abscess, or post nasal drip.  Neck: Supple. No thyromegaly. Full ROM. No lymphadenopathy. Lungs: Clear bilaterally to auscultation without wheezes, rales, or rhonchi. Breathing is unlabored. Heart: RRR with S1 S2. No murmurs, rubs, or gallops appreciated. Abdomen: Soft, non-tender, non-distended with normoactive bowel sunds. No hepatomegaly. No rebound/guarding. No  obvious abdominal masses. Msk:  Strength and tone normal for age. Extremities/Skin: Warm and dry. No clubbing or cyanosis. No edema. No rashes or suspicious lesions. Neuro: Alert and oriented X 3. Moves all extremities spontaneously. Gait is normal. CNII-XII grossly in tact. Psych:  Responds to questions  appropriately with a normal affect.   Labs:   ASSESSMENT AND PLAN:  DIAGNOSTIC STUDIES: Oxygen Saturation is 98% on RA, nl by my interpretation.    COORDINATION OF CARE: 9:10 AM-Discussed treatment plan which includes meds refill and topical cream (Lotrimen) for the itching with pt at bedside and pt agreed to plan. Pt was offered cortisone as well, however, he states he does not wish to use any topical creams with hormones in them, including cortisone.    63 y.o. year old male with Erectile dysfunction due to diseases classified elsewhere - Plan: sildenafil (VIAGRA) 100 MG tablet  Bilateral inguinal hernia without obstruction or gangrene, recurrence not specified  Proctitis     Signed, Elvina Sidle, MD 06/22/2014 9:06 AM

## 2014-07-08 ENCOUNTER — Encounter (HOSPITAL_COMMUNITY): Payer: Self-pay | Admitting: Pharmacy Technician

## 2014-07-08 ENCOUNTER — Other Ambulatory Visit (HOSPITAL_COMMUNITY): Payer: Self-pay | Admitting: *Deleted

## 2014-07-08 NOTE — Patient Instructions (Addendum)
Bryan Harmon  07/08/2014                           YOUR PROCEDURE IS SCHEDULED ON: 07/21/14                ENTER FROM [address] - GO TO PARKING DECK               LOOK FOR [employer]  / [employer]                              FOLLOW  SIGNS TO SHORT STAY CENTER                 ARRIVE AT SHORT STAY AT:  6:30 am               CALL THIS NUMBER IF ANY PROBLEMS THE DAY OF SURGERY :               [phone]                                REMEMBER:   Do not eat food or drink liquids AFTER MIDNIGHT                  Take these medicines the morning of surgery with               A SIPS OF WATER :   NONE            USE FLEET ENEMA THE NIGHT BEFORE SURGERY            STOP ASPIRIN AND HERBAL MEDS 5 DAYS PREOP      Do not wear jewelry, make-up   Do not wear lotions, powders, or perfumes.   Do not shave legs or underarms 12 hrs. before surgery (men may shave face)  Do not bring valuables to the hospital.  Contacts, dentures or bridgework may not be worn into surgery.  Leave suitcase in the car. After surgery it may be brought to your room.  For patients admitted to the hospital more than one night, checkout time is            11:00 AM                                                       The day of discharge.   Patients discharged the day of surgery will not be allowed to drive home.            If going home same day of surgery, must have someone stay with you              FIRST 24 hrs at home and arrange for some one to drive you              home from hospital.   ________________________________________________________________________  [facility] - PREPARING FOR SURGERY  Before surgery, you can play an important role.  Because skin is not sterile, your skin needs to be as free of germs as possible.  You can reduce the number of germs on your skin by washing  with CHG (chlorahexidine gluconate) soap before surgery.  CHG is an antiseptic cleaner which kills germs and bonds with the skin to continue killing germs even after washing. Please DO NOT use if you have an allergy to CHG or antibacterial soaps.  If your skin becomes reddened/irritated stop using the CHG and inform your nurse when you arrive at Short Stay. Do not shave (including legs and underarms) for at least 48 hours prior to the first CHG shower.  You may shave your face. Please follow these instructions carefully:   1.  Shower with CHG Soap the night before surgery and the  morning of Surgery.   2.  If you choose to wash your hair, wash your hair first as usual with your  normal  Shampoo.   3.  After you shampoo, rinse your hair and body thoroughly to remove the  shampoo.                                         4.  Use CHG as you would any other liquid soap.  You can apply chg directly  to the skin and wash . Gently wash with scrungie or clean wascloth    5.  Apply the CHG Soap to your body ONLY FROM THE NECK DOWN.   Do not use on open                           Wound or open sores. Avoid contact with eyes, ears mouth and genitals (private parts).                        Genitals (private parts) with your normal soap.              6.  Wash thoroughly, paying special attention to the area where your surgery  will be performed.   7.  Thoroughly rinse your body with warm water from the neck down.   8.  DO NOT shower/wash with your normal soap after using and rinsing off  the CHG Soap .                9.  Pat yourself dry with a clean towel.             10.  Wear clean pajamas.             11.  Place clean sheets on your bed the night of your first shower and do not  sleep with pets.  Day of Surgery : Do not apply any lotions/deodorants the morning of surgery.  Please wear clean clothes to the hospital/surgery center.  FAILURE TO FOLLOW THESE INSTRUCTIONS MAY RESULT IN THE  CANCELLATION OF YOUR SURGERY    PATIENT SIGNATURE_________________________________  ______________________________________________________________________

## 2014-07-09 ENCOUNTER — Encounter (HOSPITAL_COMMUNITY)
Admission: RE | Admit: 2014-07-09 | Discharge: 2014-07-09 | Disposition: A | Discharge status: Home / Self Care | Payer: 59 | Source: Ambulatory Visit | Attending: Surgery | Admitting: Surgery

## 2014-07-09 ENCOUNTER — Encounter (HOSPITAL_COMMUNITY): Payer: Self-pay

## 2014-07-09 DIAGNOSIS — I10 Essential (primary) hypertension: Secondary | ICD-10-CM | POA: Insufficient documentation

## 2014-07-09 DIAGNOSIS — Z01810 Encounter for preprocedural cardiovascular examination: Secondary | ICD-10-CM | POA: Diagnosis present

## 2014-07-09 HISTORY — DX: Bilateral inguinal hernia, without obstruction or gangrene, not specified as recurrent: K40.20

## 2014-07-09 NOTE — Progress Notes (Signed)
PT arrived at PST - BP 193/120 - pt states BP has been elevated in past but he did not want to take any medications and at times BP is normal. BP rechecked after 40 min interview - 196/121 - Dr. Council Mechanic notified. Instructed to tell pt he needs evaluation by primary MD and will not be able to have surgery if BP is not better controlled. I spoke with Marcelino Duster at Dr. Norva Riffle office. She will make appointment for pt and notify him. Pt informed and did not want to have any further preop work up done until he talks with Dr. Daphine Deutscher about possibly not needing the surgery. Pt states he will refuse to take any medications to treat BP. Lab and EKG cancelled until definitive action is obtained concerning pt's surgery status.

## 2014-07-21 ENCOUNTER — Ambulatory Visit (HOSPITAL_COMMUNITY): Admission: RE | Admit: 2014-07-21 | Payer: 59 | Source: Ambulatory Visit | Admitting: Surgery

## 2014-07-21 ENCOUNTER — Encounter (HOSPITAL_COMMUNITY): Admission: RE | Payer: Self-pay | Source: Ambulatory Visit

## 2014-07-21 SURGERY — REPAIR, HERNIA, INGUINAL, BILATERAL, LAPAROSCOPIC
Anesthesia: General | Laterality: Bilateral

## 2014-08-20 ENCOUNTER — Telehealth: Payer: Self-pay

## 2014-08-20 NOTE — Telephone Encounter (Signed)
Dr.Lauenstein, Pt would like to have something called in for his blood pressure, he has not taken blood pressure medication before.  Best# [phone]

## 2014-08-20 NOTE — Telephone Encounter (Signed)
Pt advised to make an appt with Dr. Milus Glazier. Transferred pt to scheduling.

## 2014-08-26 ENCOUNTER — Ambulatory Visit (INDEPENDENT_AMBULATORY_CARE_PROVIDER_SITE_OTHER): Payer: 59 | Admitting: Family Medicine

## 2014-08-26 ENCOUNTER — Encounter: Payer: Self-pay | Admitting: Family Medicine

## 2014-08-26 VITALS — BP 182/106 | HR 63 | Temp 98.5°F | Resp 16 | Ht 69.5 in | Wt 195.0 lb

## 2014-08-26 DIAGNOSIS — K6289 Other specified diseases of anus and rectum: Secondary | ICD-10-CM

## 2014-08-26 DIAGNOSIS — N529 Male erectile dysfunction, unspecified: Secondary | ICD-10-CM

## 2014-08-26 DIAGNOSIS — K4020 Bilateral inguinal hernia, without obstruction or gangrene, not specified as recurrent: Secondary | ICD-10-CM

## 2014-08-26 DIAGNOSIS — I10 Essential (primary) hypertension: Secondary | ICD-10-CM

## 2014-08-26 MED ORDER — TADALAFIL 20 MG PO TABS: 10.0000 mg | ORAL_TABLET | ORAL | Status: DC | PRN

## 2014-08-26 MED ORDER — CLOTRIMAZOLE-BETAMETHASONE 1-0.05 % EX CREA: 1.0000 "application " | TOPICAL_CREAM | Freq: Two times a day (BID) | CUTANEOUS | Status: DC

## 2014-08-26 MED ORDER — AMLODIPINE BESYLATE 10 MG PO TABS: 10.0000 mg | ORAL_TABLET | Freq: Every day | ORAL | Status: DC

## 2014-08-26 NOTE — Progress Notes (Signed)
63 yo with lifelong hypertension, resistant to taking medications, wants bilateral hernia repair.  He was denied surgery because of hypertension.  I saw patient 05/04/2014 but did not charge him.  He came in complaining of hernia but did not want to pay.  I then referred him to the surgeon and recommended he not travel until hernia repaired.  Objective:  NAD Neck supple, no bruit Chest: clear Heart: reg without murmur gallop Ext:  No edema. Red, excoriated perianal area.  This chart was scribed in my presence and reviewed by me personally.    ICD-9-CM ICD-10-CM   1. Essential hypertension 401.9 I10 amLODipine (NORVASC) 10 MG tablet  2. Bilateral inguinal hernia without obstruction or gangrene, recurrence not specified 550.92 K40.20   3. Proctitis 569.49 K62.89 clotrimazole-betamethasone (LOTRISONE) cream  4. Erectile dysfunction, unspecified erectile dysfunction type 607.84 N52.9 tadalafil (CIALIS) 20 MG tablet     Signed, Elvina Sidle, MD

## 2015-02-25 ENCOUNTER — Other Ambulatory Visit: Payer: Self-pay | Admitting: Family Medicine

## 2015-02-27 ENCOUNTER — Other Ambulatory Visit: Payer: Self-pay | Admitting: Family Medicine

## 2015-02-28 NOTE — Telephone Encounter (Signed)
Can we refill? 

## 2015-09-21 ENCOUNTER — Telehealth: Payer: Self-pay | Admitting: Family Medicine

## 2015-09-21 NOTE — Telephone Encounter (Signed)
Spoke with patient and he stated that he will call after he sees his Careers adviser.  He stated that if he needs medicine, he just calls Dr. Milus Glazier and they call his medicine in.

## 2015-11-23 NOTE — Patient Instructions (Addendum)
Bryan Harmon  11/23/2015   Your procedure is scheduled on: 12/07/2015   Report to [facility] Main  Entrance take [place]  elevators to 3rd floor to  Short Stay Center at    0700 AM.  Call this number if you have problems the morning of surgery [phone]   Remember: ONLY 1 PERSON MAY GO WITH YOU TO SHORT STAY TO GET  READY MORNING OF YOUR SURGERY.  Do not eat food or drink liquids :After Midnight.     Take these medicines the morning of surgery with A SIP OF WATER:                                 You may not have any metal on your body including hair pins and              piercings  Do not wear jewelry,  lotions, powders or perfumes, deodorant                        Men may shave face and neck.   Do not bring valuables to the hospital. [facility] IS NOT             RESPONSIBLE   FOR VALUABLES.  Contacts, dentures or bridgework may not be worn into surgery.       Patients discharged the day of surgery will not be allowed to drive home.  Name and phone number of your driver:  Special Instructions: coughing and deep breathing exercises, leg exercises               Please read over the following fact sheets you were given: _____________________________________________________________________             [facility] - Preparing for Surgery Before surgery, you can play an important role.  Because skin is not sterile, your skin needs to be as free of germs as possible.  You can reduce the number of germs on your skin by washing with CHG (chlorahexidine gluconate) soap before surgery.  CHG is an antiseptic cleaner which kills germs and bonds with the skin to continue killing germs even after washing. Please DO NOT use if you have an allergy to CHG or antibacterial soaps.  If your skin becomes reddened/irritated stop using the CHG and inform your nurse when you arrive at Short Stay. Do not shave (including legs and underarms) for at least 48 hours prior to  the first CHG shower.  You may shave your face/neck. Please follow these instructions carefully:  1.  Shower with CHG Soap the night before surgery and the  morning of Surgery.  2.  If you choose to wash your hair, wash your hair first as usual with your  normal  shampoo.  3.  After you shampoo, rinse your hair and body thoroughly to remove the  shampoo.                           4.  Use CHG as you would any other liquid soap.  You can apply chg directly  to the skin and wash                       Gently with a scrungie or clean washcloth.  5.  Apply the CHG Soap to your body ONLY FROM THE NECK DOWN.   Do not use on face/ open                           Wound or open sores. Avoid contact with eyes, ears mouth and genitals (private parts).                       Wash face,  Genitals (private parts) with your normal soap.             6.  Wash thoroughly, paying special attention to the area where your surgery  will be performed.  7.  Thoroughly rinse your body with warm water from the neck down.  8.  DO NOT shower/wash with your normal soap after using and rinsing off  the CHG Soap.                9.  Pat yourself dry with a clean towel.            10.  Wear clean pajamas.            11.  Place clean sheets on your bed the night of your first shower and do not  sleep with pets. Day of Surgery : Do not apply any lotions/deodorants the morning of surgery.  Please wear clean clothes to the hospital/surgery center.  FAILURE TO FOLLOW THESE INSTRUCTIONS MAY RESULT IN THE CANCELLATION OF YOUR SURGERY PATIENT SIGNATURE_________________________________  NURSE SIGNATURE__________________________________  ________________________________________________________________________

## 2015-11-24 ENCOUNTER — Encounter (HOSPITAL_COMMUNITY)
Admission: RE | Admit: 2015-11-24 | Discharge: 2015-11-24 | Disposition: A | Discharge status: Home / Self Care | Payer: BLUE CROSS/BLUE SHIELD | Source: Ambulatory Visit | Attending: Surgery | Admitting: Surgery

## 2015-11-24 ENCOUNTER — Encounter (HOSPITAL_COMMUNITY): Payer: Self-pay

## 2015-11-24 DIAGNOSIS — I10 Essential (primary) hypertension: Secondary | ICD-10-CM | POA: Diagnosis not present

## 2015-11-24 DIAGNOSIS — Z01812 Encounter for preprocedural laboratory examination: Secondary | ICD-10-CM | POA: Insufficient documentation

## 2015-11-24 DIAGNOSIS — R9431 Abnormal electrocardiogram [ECG] [EKG]: Secondary | ICD-10-CM | POA: Diagnosis not present

## 2015-11-24 DIAGNOSIS — K4020 Bilateral inguinal hernia, without obstruction or gangrene, not specified as recurrent: Secondary | ICD-10-CM | POA: Insufficient documentation

## 2015-11-24 DIAGNOSIS — Z01818 Encounter for other preprocedural examination: Secondary | ICD-10-CM | POA: Insufficient documentation

## 2015-11-24 LAB — BASIC METABOLIC PANEL
Anion gap: 8 (ref 5–15)
BUN: 21 mg/dL — AB (ref 6–20)
CHLORIDE: 104 mmol/L (ref 101–111)
CO2: 28 mmol/L (ref 22–32)
CREATININE: 1.2 mg/dL (ref 0.61–1.24)
Calcium: 9 mg/dL (ref 8.9–10.3)
GFR calc Af Amer: >60 mL/min (ref >60)
GFR calc non Af Amer: >60 mL/min (ref >60)
Glucose, Bld: 102 mg/dL — ABNORMAL HIGH (ref 65–99)
Potassium: 4 mmol/L (ref 3.5–5.1)
SODIUM: 140 mmol/L (ref 135–145)

## 2015-11-24 LAB — CBC
HCT: 43.6 % (ref 39.0–52.0)
Hemoglobin: 14.9 g/dL (ref 13.0–17.0)
MCH: 33.1 pg (ref 26.0–34.0)
MCHC: 34.2 g/dL (ref 30.0–36.0)
MCV: 96.9 fL (ref 78.0–100.0)
PLATELETS: 232 10*3/uL (ref 150–400)
RBC: 4.5 MIL/uL (ref 4.22–5.81)
RDW: 12.3 % (ref 11.5–15.5)
WBC: 4.5 10*3/uL (ref 4.0–10.5)

## 2015-11-24 NOTE — Progress Notes (Signed)
Dr Daphine Deutscher called back and I reported information enclosed in progress note that Dr Eilene Ghazi ( anesthesia) stated and Dr Daphine Deutscher stated he would arrange for patient to see PCP regarding blood pressure issues.

## 2015-11-24 NOTE — Progress Notes (Signed)
Spoke with Arva Chafe, CMA at office ( Triage) and she printed off the progress note and will give to Dr Daphine Deutscher regarding patient's blood pressure .  Dr Daphine Deutscher is in office today.

## 2015-11-24 NOTE — Progress Notes (Signed)
BMP done 11/24/15 faxed via EPIC to Dr Daphine Deutscher.

## 2015-11-24 NOTE — Progress Notes (Signed)
At time of preop appointment blood pressure- 174/113  initially in left arm.  Patient states he does note take any medications.  He has been on blood pressure medication in the past but it made him feel bad.  He states he runs 50 miles per week and runs in marathons.  Voices no complaints.  Recheck of blood pressure at 0900am was in left arm- 182/122 and in right arm was 173/104.  Dr Eilene Ghazi ( anesthesia ) made aware and that EKG shows Normal Sinus Rhythm.  Rate of 64.  Dr Okey Dupre stated patient needs to be on blood pressure medication prior to surgery.  I told Dr Okey Dupre I would inform Dr Luretha Murphy.

## 2015-11-25 NOTE — Progress Notes (Signed)
Final EKG done 11/24/15- EPIC

## 2015-11-30 ENCOUNTER — Ambulatory Visit: Payer: Self-pay | Admitting: Surgery

## 2015-12-02 NOTE — Progress Notes (Signed)
Called patient to see if he had been placed on any blood pressure medications by PCP.  Patient stated that Dr Daphine Deutscher had placed him on hydrochlorothiazide 50 mg daily   On 11/24/2015.  Patient reported his blood pressure has improved to 130s systolic per patient report.  Called office of [facility] and spoke with Burnie ( Triage Nurse) at office and she reported that in noted on chart and confirmed that Dr Daphine Deutscher placed patient on hydrochlorothiazide 50 mg daily on 11/24/2015.  I put medication in medications in EPIC.  I instructed patient to not take am of surgery on 12/07/2015.  Patient voiced understanding.

## 2015-12-06 NOTE — Progress Notes (Signed)
Patient placed on blood pressure medication after preop appointment which is diuretic.  Spoke with Dr Leta Jungling and made him aware patient was not on any medications prior to preop.  Dr Leta Jungling gave order to recheck BMP on am of surgery.

## 2015-12-07 ENCOUNTER — Encounter (HOSPITAL_COMMUNITY): Payer: Self-pay | Admitting: *Deleted

## 2015-12-07 ENCOUNTER — Ambulatory Visit (HOSPITAL_COMMUNITY): Payer: BLUE CROSS/BLUE SHIELD | Admitting: Anesthesiology

## 2015-12-07 ENCOUNTER — Ambulatory Visit (HOSPITAL_COMMUNITY)
Admission: RE | Admit: 2015-12-07 | Discharge: 2015-12-07 | Disposition: A | Discharge status: Home / Self Care | Payer: BLUE CROSS/BLUE SHIELD | Source: Ambulatory Visit | Attending: Surgery | Admitting: Surgery

## 2015-12-07 ENCOUNTER — Encounter (HOSPITAL_COMMUNITY)
Admission: RE | Disposition: A | Discharge status: Home / Self Care | Payer: Self-pay | Source: Ambulatory Visit | Attending: Surgery

## 2015-12-07 DIAGNOSIS — I10 Essential (primary) hypertension: Secondary | ICD-10-CM | POA: Diagnosis not present

## 2015-12-07 DIAGNOSIS — K4020 Bilateral inguinal hernia, without obstruction or gangrene, not specified as recurrent: Secondary | ICD-10-CM | POA: Insufficient documentation

## 2015-12-07 DIAGNOSIS — Z9049 Acquired absence of other specified parts of digestive tract: Secondary | ICD-10-CM | POA: Diagnosis not present

## 2015-12-07 DIAGNOSIS — Z87891 Personal history of nicotine dependence: Secondary | ICD-10-CM | POA: Diagnosis not present

## 2015-12-07 DIAGNOSIS — Z79899 Other long term (current) drug therapy: Secondary | ICD-10-CM | POA: Insufficient documentation

## 2015-12-07 DIAGNOSIS — N529 Male erectile dysfunction, unspecified: Secondary | ICD-10-CM | POA: Diagnosis not present

## 2015-12-07 HISTORY — PX: INGUINAL HERNIA REPAIR: SHX194

## 2015-12-07 LAB — BASIC METABOLIC PANEL
Anion gap: 10 (ref 5–15)
BUN: 23 mg/dL — AB (ref 6–20)
CALCIUM: 9.4 mg/dL (ref 8.9–10.3)
CHLORIDE: 101 mmol/L (ref 101–111)
CO2: 28 mmol/L (ref 22–32)
CREATININE: 1.39 mg/dL — AB (ref 0.61–1.24)
GFR calc non Af Amer: 52 mL/min — ABNORMAL LOW (ref >60)
Glucose, Bld: 109 mg/dL — ABNORMAL HIGH (ref 65–99)
Potassium: 3.4 mmol/L — ABNORMAL LOW (ref 3.5–5.1)
SODIUM: 139 mmol/L (ref 135–145)

## 2015-12-07 SURGERY — REPAIR, HERNIA, INGUINAL, BILATERAL, LAPAROSCOPIC
Anesthesia: General | Laterality: Bilateral

## 2015-12-07 MED ORDER — LACTATED RINGERS IV SOLN: INTRAVENOUS | Status: DC

## 2015-12-07 MED ORDER — CEFAZOLIN SODIUM-DEXTROSE 2-3 GM-% IV SOLR
INTRAVENOUS | Status: AC
  Filled 2015-12-07: qty 50

## 2015-12-07 MED ORDER — EPHEDRINE SULFATE 50 MG/ML IJ SOLN
INTRAMUSCULAR | Status: DC | PRN
  Administered 2015-12-07 (×2): 5 mg via INTRAVENOUS

## 2015-12-07 MED ORDER — PROPOFOL 10 MG/ML IV BOLUS
INTRAVENOUS | Status: DC | PRN
  Administered 2015-12-07: 170 mg via INTRAVENOUS
  Administered 2015-12-07: 30 mg via INTRAVENOUS

## 2015-12-07 MED ORDER — HEPARIN SODIUM (PORCINE) 5000 UNIT/ML IJ SOLN
5000.0000 [IU] | Freq: Once | INTRAMUSCULAR | Status: AC
  Administered 2015-12-07: 5000 [IU] via SUBCUTANEOUS
  Filled 2015-12-07: qty 1

## 2015-12-07 MED ORDER — LIDOCAINE HCL (CARDIAC) 20 MG/ML IV SOLN
INTRAVENOUS | Status: AC
  Filled 2015-12-07: qty 5

## 2015-12-07 MED ORDER — SODIUM CHLORIDE 0.9 % IV SOLN: 250.0000 mL | INTRAVENOUS | Status: DC | PRN

## 2015-12-07 MED ORDER — SUGAMMADEX SODIUM 200 MG/2ML IV SOLN
INTRAVENOUS | Status: AC
  Filled 2015-12-07: qty 2

## 2015-12-07 MED ORDER — DEXAMETHASONE SODIUM PHOSPHATE 10 MG/ML IJ SOLN
INTRAMUSCULAR | Status: DC | PRN
  Administered 2015-12-07: 10 mg via INTRAVENOUS

## 2015-12-07 MED ORDER — CEFAZOLIN SODIUM-DEXTROSE 2-4 GM/100ML-% IV SOLN
2.0000 g | INTRAVENOUS | Status: AC
  Administered 2015-12-07: 2 g via INTRAVENOUS
  Filled 2015-12-07: qty 100

## 2015-12-07 MED ORDER — HYDROMORPHONE HCL 1 MG/ML IJ SOLN: 0.2500 mg | INTRAMUSCULAR | Status: DC | PRN

## 2015-12-07 MED ORDER — MIDAZOLAM HCL 2 MG/2ML IJ SOLN
INTRAMUSCULAR | Status: AC
  Filled 2015-12-07: qty 2

## 2015-12-07 MED ORDER — KETOROLAC TROMETHAMINE 15 MG/ML IJ SOLN
INTRAMUSCULAR | Status: AC
  Filled 2015-12-07: qty 1

## 2015-12-07 MED ORDER — ONDANSETRON HCL 4 MG/2ML IJ SOLN
INTRAMUSCULAR | Status: AC
  Filled 2015-12-07: qty 2

## 2015-12-07 MED ORDER — HYDROMORPHONE HCL 2 MG/ML IJ SOLN
INTRAMUSCULAR | Status: AC
  Filled 2015-12-07: qty 1

## 2015-12-07 MED ORDER — ACETAMINOPHEN 325 MG PO TABS: 650.0000 mg | ORAL_TABLET | ORAL | Status: DC | PRN

## 2015-12-07 MED ORDER — PROPOFOL 10 MG/ML IV BOLUS
INTRAVENOUS | Status: AC
  Filled 2015-12-07: qty 20

## 2015-12-07 MED ORDER — SUCCINYLCHOLINE CHLORIDE 20 MG/ML IJ SOLN
INTRAMUSCULAR | Status: DC | PRN
  Administered 2015-12-07: 100 mg via INTRAVENOUS

## 2015-12-07 MED ORDER — OXYCODONE HCL 5 MG PO TABS: 5.0000 mg | ORAL_TABLET | ORAL | Status: DC | PRN

## 2015-12-07 MED ORDER — HYDROMORPHONE HCL 1 MG/ML IJ SOLN
INTRAMUSCULAR | Status: DC | PRN
  Administered 2015-12-07 (×4): 0.5 mg via INTRAVENOUS

## 2015-12-07 MED ORDER — MIDAZOLAM HCL 5 MG/5ML IJ SOLN
INTRAMUSCULAR | Status: DC | PRN
  Administered 2015-12-07: 2 mg via INTRAVENOUS

## 2015-12-07 MED ORDER — LABETALOL HCL 5 MG/ML IV SOLN
5.0000 mg | INTRAVENOUS | Status: DC | PRN
  Administered 2015-12-07 (×2): 5 mg via INTRAVENOUS
  Filled 2015-12-07 (×3): qty 4

## 2015-12-07 MED ORDER — ROCURONIUM BROMIDE 100 MG/10ML IV SOLN
INTRAVENOUS | Status: DC | PRN
  Administered 2015-12-07: 5 mg via INTRAVENOUS
  Administered 2015-12-07: 20 mg via INTRAVENOUS
  Administered 2015-12-07 (×2): 10 mg via INTRAVENOUS
  Administered 2015-12-07: 45 mg via INTRAVENOUS

## 2015-12-07 MED ORDER — SODIUM CHLORIDE 0.9% FLUSH: 3.0000 mL | Freq: Two times a day (BID) | INTRAVENOUS | Status: DC

## 2015-12-07 MED ORDER — CHLORHEXIDINE GLUCONATE 4 % EX LIQD: 1.0000 "application " | Freq: Once | CUTANEOUS | Status: DC

## 2015-12-07 MED ORDER — FENTANYL CITRATE (PF) 100 MCG/2ML IJ SOLN
INTRAMUSCULAR | Status: DC | PRN
  Administered 2015-12-07 (×3): 50 ug via INTRAVENOUS
  Administered 2015-12-07: 100 ug via INTRAVENOUS

## 2015-12-07 MED ORDER — HYDROCODONE-ACETAMINOPHEN 5-325 MG PO TABS: 1.0000 | ORAL_TABLET | ORAL | Status: DC | PRN

## 2015-12-07 MED ORDER — SODIUM CHLORIDE 0.9% FLUSH: 3.0000 mL | INTRAVENOUS | Status: DC | PRN

## 2015-12-07 MED ORDER — BUPIVACAINE LIPOSOME 1.3 % IJ SUSP
INTRAMUSCULAR | Status: DC | PRN
  Administered 2015-12-07: 20 mL

## 2015-12-07 MED ORDER — ONDANSETRON HCL 4 MG/2ML IJ SOLN
INTRAMUSCULAR | Status: DC | PRN
  Administered 2015-12-07: 4 mg via INTRAVENOUS

## 2015-12-07 MED ORDER — FENTANYL CITRATE (PF) 250 MCG/5ML IJ SOLN
INTRAMUSCULAR | Status: AC
  Filled 2015-12-07: qty 5

## 2015-12-07 MED ORDER — ACETAMINOPHEN 650 MG RE SUPP
650.0000 mg | RECTAL | Status: DC | PRN
  Filled 2015-12-07: qty 1

## 2015-12-07 MED ORDER — KETOROLAC TROMETHAMINE 15 MG/ML IJ SOLN
15.0000 mg | Freq: Once | INTRAMUSCULAR | Status: AC
  Administered 2015-12-07: 15 mg via INTRAVENOUS

## 2015-12-07 MED ORDER — SUGAMMADEX SODIUM 200 MG/2ML IV SOLN
INTRAVENOUS | Status: DC | PRN
  Administered 2015-12-07: 180 mg via INTRAVENOUS

## 2015-12-07 MED ORDER — BUPIVACAINE LIPOSOME 1.3 % IJ SUSP
20.0000 mL | Freq: Once | INTRAMUSCULAR | Status: DC
  Filled 2015-12-07: qty 20

## 2015-12-07 MED ORDER — DEXAMETHASONE SODIUM PHOSPHATE 10 MG/ML IJ SOLN
INTRAMUSCULAR | Status: AC
  Filled 2015-12-07: qty 1

## 2015-12-07 MED ORDER — BUPIVACAINE-EPINEPHRINE (PF) 0.25% -1:200000 IJ SOLN
INTRAMUSCULAR | Status: AC
  Filled 2015-12-07: qty 30

## 2015-12-07 MED ORDER — EPHEDRINE SULFATE 50 MG/ML IJ SOLN
INTRAMUSCULAR | Status: AC
  Filled 2015-12-07: qty 1

## 2015-12-07 MED ORDER — LACTATED RINGERS IV SOLN
INTRAVENOUS | Status: DC | PRN
  Administered 2015-12-07 (×2): via INTRAVENOUS

## 2015-12-07 MED ORDER — ROCURONIUM BROMIDE 100 MG/10ML IV SOLN
INTRAVENOUS | Status: AC
  Filled 2015-12-07: qty 1

## 2015-12-07 SURGICAL SUPPLY — 42 items
APL SKNCLS STERI-STRIP NONHPOA (GAUZE/BANDAGES/DRESSINGS) ×1
BENZOIN TINCTURE PRP APPL 2/3 (GAUZE/BANDAGES/DRESSINGS) ×3 IMPLANT
CABLE HIGH FREQUENCY MONO STRZ (ELECTRODE) ×3 IMPLANT
CLOSURE WOUND 1/2 X4 (GAUZE/BANDAGES/DRESSINGS)
COVER SURGICAL LIGHT HANDLE (MISCELLANEOUS) ×2 IMPLANT
DECANTER SPIKE VIAL GLASS SM (MISCELLANEOUS) ×1 IMPLANT
DEVICE SECURE STRAP 25 ABSORB (INSTRUMENTS) ×2 IMPLANT
DISSECT BALLN SPACEMKR + OVL (BALLOONS) ×3
DISSECTOR BALLN SPACEMKR + OVL (BALLOONS) ×1 IMPLANT
DISSECTOR BLUNT TIP ENDO 5MM (MISCELLANEOUS) ×2 IMPLANT
DRAPE LAPAROSCOPIC ABDOMINAL (DRAPES) ×3 IMPLANT
ELECT PENCIL ROCKER SW 15FT (MISCELLANEOUS) ×1 IMPLANT
ELECT REM PT RETURN 9FT ADLT (ELECTROSURGICAL) ×3
ELECTRODE REM PT RTRN 9FT ADLT (ELECTROSURGICAL) ×1 IMPLANT
GLOVE BIOGEL M 8.0 STRL (GLOVE) ×19 IMPLANT
GOWN SPEC L4 XLG W/TWL (GOWN DISPOSABLE) ×3 IMPLANT
GOWN STRL REUS W/TWL XL LVL3 (GOWN DISPOSABLE) ×9 IMPLANT
KIT BASIN OR (CUSTOM PROCEDURE TRAY) ×3 IMPLANT
LIQUID BAND (GAUZE/BANDAGES/DRESSINGS) ×3 IMPLANT
MARKER SKIN DUAL TIP RULER LAB (MISCELLANEOUS) ×3 IMPLANT
MESH 3DMAX 4X6 LT LRG (Mesh General) ×2 IMPLANT
MESH 3DMAX 4X6 RT LRG (Mesh General) ×2 IMPLANT
PAD POSITIONING PINK XL (MISCELLANEOUS) IMPLANT
POSITIONER SURGICAL ARM (MISCELLANEOUS) IMPLANT
SCISSORS LAP 5X35 DISP (ENDOMECHANICALS) ×2 IMPLANT
SET IRRIG TUBING LAPAROSCOPIC (IRRIGATION / IRRIGATOR) IMPLANT
SLEEVE XCEL OPT CAN 5 100 (ENDOMECHANICALS) ×3 IMPLANT
SOLUTION ANTI FOG 6CC (MISCELLANEOUS) ×3 IMPLANT
STRIP CLOSURE SKIN 1/2X4 (GAUZE/BANDAGES/DRESSINGS) IMPLANT
SUT ETHILON 3 0 PS 1 (SUTURE) ×2 IMPLANT
SUT VIC AB 4-0 SH 18 (SUTURE) ×3 IMPLANT
TACKER 5MM HERNIA 3.5CML NAB (ENDOMECHANICALS) ×1 IMPLANT
TAPE CLOTH 4X10 WHT NS (GAUZE/BANDAGES/DRESSINGS) IMPLANT
TOWEL OR NON WOVEN STRL DISP B (DISPOSABLE) ×3 IMPLANT
TRAY FOLEY W/METER SILVER 14FR (SET/KITS/TRAYS/PACK) ×1 IMPLANT
TRAY FOLEY W/METER SILVER 16FR (SET/KITS/TRAYS/PACK) ×3 IMPLANT
TRAY LAPAROSCOPIC (CUSTOM PROCEDURE TRAY) ×3 IMPLANT
TROCAR BALLN 12MMX100 BLUNT (TROCAR) ×2 IMPLANT
TROCAR BLADELESS OPT 5 100 (ENDOMECHANICALS) ×2 IMPLANT
TROCAR BLADELESS OPT 5 75 (ENDOMECHANICALS) ×3 IMPLANT
TROCAR XCEL NON-BLD 11X100MML (ENDOMECHANICALS) ×2 IMPLANT
TUBING INSUF HEATED (TUBING) ×3 IMPLANT

## 2015-12-07 NOTE — Anesthesia Postprocedure Evaluation (Signed)
Anesthesia Post Note  Patient: Bryan Harmon  Procedure(s) Performed: Procedure(s) (LRB): LAPAROSCOPIC BILATERAL INGUINAL HERNIA REPAIR WITH MESH (Bilateral)  Patient location during evaluation: PACU Anesthesia Type: General Level of consciousness: awake and alert Pain management: pain level controlled Vital Signs Assessment: post-procedure vital signs reviewed and stable Respiratory status: spontaneous breathing, nonlabored ventilation, respiratory function stable and patient connected to nasal cannula oxygen Cardiovascular status: blood pressure returned to baseline and stable Postop Assessment: no signs of nausea or vomiting Anesthetic complications: no    Last Vitals:  Filed Vitals:   12/07/15 1301 12/07/15 1340  BP: 155/102 152/86  Pulse: 70 65  Temp: 36.5 C   Resp: 16     Last Pain:  Filed Vitals:   12/07/15 1341  PainSc: 0-No pain                 [name]

## 2015-12-07 NOTE — Anesthesia Procedure Notes (Signed)
Procedure Name: Intubation Date/Time: 12/07/2015 9:06 AM Performed by: Enriqueta Shutter D Pre-anesthesia Checklist: Patient identified, Emergency Drugs available, Suction available and Patient being monitored Patient Re-evaluated:Patient Re-evaluated prior to inductionOxygen Delivery Method: Circle System Utilized Preoxygenation: Pre-oxygenation with 100% oxygen Intubation Type: IV induction Ventilation: Mask ventilation without difficulty Laryngoscope Size: Mac and 4 Tube type: Oral Tube size: 7.5 mm Number of attempts: 1 Airway Equipment and Method: Stylet and Oral airway Placement Confirmation: ETT inserted through vocal cords under direct vision,  positive ETCO2 and breath sounds checked- equal and bilateral Secured at: 22 cm Tube secured with: Tape Dental Injury: Teeth and Oropharynx as per pre-operative assessment

## 2015-12-07 NOTE — Discharge Instructions (Signed)
Inguinal Hernia, Adult °Muscles help keep everything in the body in its proper place. But if a weak spot in the muscles develops, something can poke through. That is called a hernia. When this happens in the lower part of the belly (abdomen), it is called an inguinal hernia. (It takes its name from a part of the body in this region called the inguinal canal.) A weak spot in the wall of muscles lets some fat or part of the small intestine bulge through. An inguinal hernia can develop at any age. Men get them more often than women. °CAUSES  °In adults, an inguinal hernia develops over time. °· It can be triggered by: °¨ Suddenly straining the muscles of the lower abdomen. °¨ Lifting heavy objects. °¨ Straining to have a bowel movement. Difficult bowel movements (constipation) can lead to this. °¨ Constant coughing. This may be caused by smoking or lung disease. °¨ Being overweight. °¨ Being pregnant. °¨ Working at a job that requires long periods of standing or heavy lifting. °¨ Having had an inguinal hernia before. °One type can be an emergency situation. It is called a strangulated inguinal hernia. It develops if part of the small intestine slips through the weak spot and cannot get back into the abdomen. The blood supply can be cut off. If that happens, part of the intestine may die. This situation requires emergency surgery. °SYMPTOMS  °Often, a small inguinal hernia has no symptoms. It is found when a healthcare provider does a physical exam. Larger hernias usually have symptoms.  °· In adults, symptoms may include: °¨ A lump in the groin. This is easier to see when the person is standing. It might disappear when lying down. °¨ In men, a lump in the scrotum. °¨ Pain or burning in the groin. This occurs especially when lifting, straining or coughing. °¨ A dull ache or feeling of pressure in the groin. °· Signs of a strangulated hernia can include: °¨ A bulge in the groin that becomes very painful and tender to the  touch. °¨ A bulge that turns red or purple. °¨ Fever, nausea and vomiting. °¨ Inability to have a bowel movement or to pass gas. °DIAGNOSIS  °To decide if you have an inguinal hernia, a healthcare provider will probably do a physical examination. °· This will include asking questions about any symptoms you have noticed. °· The healthcare provider might feel the groin area and ask you to cough. If an inguinal hernia is felt, the healthcare provider may try to slide it back into the abdomen. °· Usually no other tests are needed. °TREATMENT  °Treatments can vary. The size of the hernia makes a difference. Options include: °· Watchful waiting. This is often suggested if the hernia is small and you have had no symptoms. °¨ No medical procedure will be done unless symptoms develop. °¨ You will need to watch closely for symptoms. If any occur, contact your healthcare provider right away. °· Surgery. This is used if the hernia is larger or you have symptoms. °¨ Open surgery. This is usually an outpatient procedure (you will not stay overnight in a hospital). An cut (incision) is made through the skin in the groin. The hernia is put back inside the abdomen. The weak area in the muscles is then repaired by herniorrhaphy or hernioplasty. Herniorrhaphy: in this type of surgery, the weak muscles are sewn back together. Hernioplasty: a patch or mesh is used to close the weak area in the abdominal wall. °¨ Laparoscopy.   In this procedure, a surgeon makes small incisions. A thin tube with a tiny video camera (called a laparoscope) is put into the abdomen. The surgeon repairs the hernia with mesh by looking with the video camera and using two long instruments. °HOME CARE INSTRUCTIONS  °· After surgery to repair an inguinal hernia: °¨ You will need to take pain medicine prescribed by your healthcare provider. Follow all directions carefully. °¨ You will need to take care of the wound from the incision. °¨ Your activity will be  restricted for awhile. This will probably include no heavy lifting for several weeks. You also should not do anything too active for a few weeks. When you can return to work will depend on the type of job that you have. °· During "watchful waiting" periods, you should: °¨ Maintain a healthy weight. °¨ Eat a diet high in fiber (fruits, vegetables and whole grains). °¨ Drink plenty of fluids to avoid constipation. This means drinking enough water and other liquids to keep your urine clear or pale yellow. °¨ Do not lift heavy objects. °¨ Do not stand for long periods of time. °¨ Quit smoking. This should keep you from developing a frequent cough. °SEEK MEDICAL CARE IF:  °· A bulge develops in your groin area. °· You feel pain, a burning sensation or pressure in the groin. This might be worse if you are lifting or straining. °· You develop a fever of more than 100.5° F (38.1° C). °SEEK IMMEDIATE MEDICAL CARE IF:  °· Pain in the groin increases suddenly. °· A bulge in the groin gets bigger suddenly and does not go down. °· For men, there is sudden pain in the scrotum. Or, the size of the scrotum increases. °· A bulge in the groin area becomes red or purple and is painful to touch. °· You have nausea or vomiting that does not go away. °· You feel your heart beating much faster than normal. °· You cannot have a bowel movement or pass gas. °· You develop a fever of more than 102.0° F (38.9° C). °  °This information is not intended to replace advice given to you by your health care provider. Make sure you discuss any questions you have with your health care provider. °  °Document Released: 01/13/2009 Document Revised: 11/19/2011 Document Reviewed: 02/28/2015 °Elsevier Interactive Patient Education ©2016 [employer] ° ° °General Anesthesia, Adult, Care After °Refer to this sheet in the next few weeks. These instructions provide you with information on caring for yourself after your procedure. Your health care provider may  also give you more specific instructions. Your treatment has been planned according to current medical practices, but problems sometimes occur. Call your health care provider if you have any problems or questions after your procedure. °WHAT TO EXPECT AFTER THE PROCEDURE °After the procedure, it is typical to experience: °· Sleepiness. °· Nausea and vomiting. °HOME CARE INSTRUCTIONS °· For the first 24 hours after general anesthesia: °¨ Have a responsible person with you. °¨ Do not drive a car. If you are alone, do not take public transportation. °¨ Do not drink alcohol. °¨ Do not take medicine that has not been prescribed by your health care provider. °¨ Do not sign important papers or make important decisions. °¨ You may resume a normal diet and activities as directed by your health care provider. °· Change bandages (dressings) as directed. °· If you have questions or problems that seem related to general anesthesia, call the hospital and ask for   the anesthetist or anesthesiologist on call. °SEEK MEDICAL CARE IF: °· You have nausea and vomiting that continue the day after anesthesia. °· You develop a rash. °SEEK IMMEDIATE MEDICAL CARE IF:  °· You have difficulty breathing. °· You have chest pain. °· You have any allergic problems. °  °This information is not intended to replace advice given to you by your health care provider. Make sure you discuss any questions you have with your health care provider. °  °Document Released: 12/03/2000 Document Revised: 09/17/2014 Document Reviewed: 12/26/2011 °Elsevier Interactive Patient Education ©2016 [employer] ° °

## 2015-12-07 NOTE — Anesthesia Preprocedure Evaluation (Addendum)
Anesthesia Evaluation  Patient identified by MRN, date of birth, ID band Patient awake    Reviewed: Allergy & Precautions, NPO status , Patient's Chart, lab work & pertinent test results  Airway Mallampati: II  TM Distance: >3 FB Neck ROM: Full    Dental  (+) Edentulous Upper, Edentulous Lower   Pulmonary former smoker,    Pulmonary exam normal breath sounds clear to auscultation       Cardiovascular Exercise Tolerance: Good hypertension, Pt. on medications Normal cardiovascular exam Rhythm:Regular Rate:Normal  Great exercise tolerance. Marathon runner and biker.   Neuro/Psych negative neurological ROS  negative psych ROS   GI/Hepatic negative GI ROS, Neg liver ROS,   Endo/Other  negative endocrine ROS  Renal/GU Cr 1.39 K 3.4  negative genitourinary   Musculoskeletal negative musculoskeletal ROS (+)   Abdominal   Peds negative pediatric ROS (+)  Hematology negative hematology ROS (+)   Anesthesia Other Findings   Reproductive/Obstetrics negative OB ROS                           Anesthesia Physical Anesthesia Plan  ASA: II  Anesthesia Plan: General   Post-op Pain Management:    Induction: Intravenous  Airway Management Planned: Oral ETT  Additional Equipment:   Intra-op Plan:   Post-operative Plan: Extubation in OR  Informed Consent: I have reviewed the patients History and Physical, chart, labs and discussed the procedure including the risks, benefits and alternatives for the proposed anesthesia with the patient or authorized representative who has indicated his/her understanding and acceptance.   Dental advisory given  Plan Discussed with: CRNA  Anesthesia Plan Comments:         Anesthesia Quick Evaluation

## 2015-12-07 NOTE — Interval H&P Note (Signed)
History and Physical Interval Note:  12/07/2015 8:28 AM  Bryan Harmon  has presented today for surgery, with the diagnosis of Bilateral inguinal ernia   The various methods of treatment have been discussed with the patient and family. After consideration of risks, benefits and other options for treatment, the patient has consented to  Procedure(s): LAPAROSCOPIC BILATERAL INGUINAL HERNIA REPAIR (Bilateral) as a surgical intervention .  The patient's history has been reviewed, patient examined, no change in status, stable for surgery.  I have reviewed the patient's chart and labs.  Questions were answered to the patient's satisfaction.     Tieisha Darden B

## 2015-12-07 NOTE — Transfer of Care (Signed)
Immediate Anesthesia Transfer of Care Note  Patient: Bryan Harmon  Procedure(s) Performed: Procedure(s): LAPAROSCOPIC BILATERAL INGUINAL HERNIA REPAIR WITH MESH (Bilateral)  Patient Location: PACU  Anesthesia Type:General  Level of Consciousness: awake, alert  and oriented  Airway & Oxygen Therapy: Patient Spontanous Breathing and Patient connected to face mask oxygen  Post-op Assessment: Report given to RN and Post -op Vital signs reviewed and stable  Post vital signs: Reviewed and stable  Last Vitals:  Filed Vitals:   12/07/15 0656  BP: 163/92  Pulse: 49  Temp: 36.4 C  Resp: 16    Complications: No apparent anesthesia complications

## 2015-12-07 NOTE — Brief Op Note (Signed)
12/07/2015  11:32 AM  PATIENT:  Bryan Harmon  65 y.o. male  PRE-OPERATIVE DIAGNOSIS:  Bilateral inguinal ernia   POST-OPERATIVE DIAGNOSIS:  bilateral inguinal hernia  PROCEDURE:  Procedure(s): LAPAROSCOPIC BILATERAL INGUINAL HERNIA REPAIR WITH MESH (Bilateral)  SURGEON:  Surgeon(s) and Role:    * Luretha Murphy, MD - Primary  PHYSICIAN ASSISTANT:   ASSISTANTS: Madelin Rear, MD,   ANESTHESIA:   general  EBL:  Total I/O In: 2000 [I.V.:2000] Out: 200 [Urine:150; Blood:50]  BLOOD ADMINISTERED:none  DRAINS: none   LOCAL MEDICATIONS USED:  BUPIVICAINE   SPECIMEN:  No Specimen  DISPOSITION OF SPECIMEN:  N/A  COUNTS:  YES  TOURNIQUET:  * No tourniquets in log *  DICTATION: .Other Dictation: Dictation Number [id number]  PLAN OF CARE: Discharge to home after PACU  PATIENT DISPOSITION:  PACU - hemodynamically stable.   Delay start of Pharmacological VTE agent (>24hrs) due to surgical blood loss or risk of bleeding: not applicable

## 2015-12-07 NOTE — H&P (Signed)
Chief Complaint:  Bilateral inguinal herniae  History of Present Illness:  Bryan Harmon is an 65 y.o. male who has been seen several times in the office most recently in [place] with prominent bilateral inguinal herniae.  He is aware of the procedure and risks and wants to proceed.  He was started on HCTZ because of his blood pressure which has been elevated in the past.    Past Medical History  Diagnosis Date  . Hypertension   . Inguinal hernia, bilateral     Past Surgical History  Procedure Laterality Date  . Appendectomy  age 79  . Dental surgery      No current facility-administered medications for this encounter.   Current Outpatient Prescriptions  Medication Sig Dispense Refill  . hydrochlorothiazide (HYDRODIURIL) 50 MG tablet Take 50 mg by mouth daily.    Marland Kitchen amLODipine (NORVASC) 10 MG tablet Take 1 tablet (10 mg total) by mouth daily. (Patient not taking: Reported on 11/24/2015) 90 tablet 3  . clotrimazole-betamethasone (LOTRISONE) cream APPLY 1 APPLICATION TOPICALLY 2 (TWO) TIMES DAILY. (Patient not taking: Reported on 11/24/2015) 30 g 2  . tadalafil (CIALIS) 20 MG tablet Take 0.5-1 tablets (10-20 mg total) by mouth every other day as needed for erectile dysfunction. (Patient not taking: Reported on 11/24/2015) 5 tablet 11   Review of patient's allergies indicates no known allergies. Family History  Problem Relation Age of Onset  . Hypertension Maternal Grandmother   . Cancer Neg Hx   . Heart disease Neg Hx    Social History:   reports that he quit smoking about 19 years ago. He has never used smokeless tobacco. He reports that he drinks alcohol. He reports that he does not use illicit drugs.   REVIEW OF SYSTEMS : Negative except for see problem list  Physical Exam:   There were no vitals taken for this visit. There is no weight on file to calculate BMI.  Gen:  WDWN WF NAD  Neurological: Alert and oriented to person, place, and time. Motor and sensory function  is grossly intact  Head: Normocephalic and atraumatic.  Eyes: Conjunctivae are normal. Pupils are equal, round, and reactive to light. No scleral icterus.  Neck: Normal range of motion. Neck supple. No tracheal deviation or thyromegaly present.  Cardiovascular:  SR without murmurs or gallops.  No carotid bruits Breast:  Not examined Respiratory: Effort normal.  No respiratory distress. No chest wall tenderness. Breath sounds normal.  No wheezes, rales or rhonchi.  Abdomen:  Soft and nontender GU:  Prominent bilateral inguinal herniae Musculoskeletal: Normal range of motion. Extremities are nontender. No cyanosis, edema or clubbing noted Lymphadenopathy: No cervical, preauricular, postauricular or axillary adenopathy is present Skin: Skin is warm and dry. No rash noted. No diaphoresis. No erythema. No pallor. Pscyh: Normal mood and affect. Behavior is normal. Judgment and thought content normal.   LABORATORY RESULTS: No results found for this or any previous visit (from the past 48 hour(s)).   RADIOLOGY RESULTS: No results found.  Problem List: Patient Active Problem List   Diagnosis Date Noted  . Essential hypertension, benign 02/27/2012  . Bilateral inguinal hernia 01/25/2010  . 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      Matt B. Daphine Deutscher, MD, [facility] [phone] beeper [phone]  12/07/2015 6:34 AM

## 2015-12-07 NOTE — Progress Notes (Signed)
Shirt stay room requested, room 6 assigned, pt ETA 15 minutes

## 2015-12-08 NOTE — Op Note (Signed)
NAMETABIUS, TORAIN NO.:  [id number]  MEDICAL RECORD NO.:  [id number]  LOCATION:  [facility]                         FACILITY:  [facility]  PHYSICIAN:  Thornton Park. Daphine Deutscher, MD  DATE OF BIRTH:  Mar 23, 1951  DATE OF PROCEDURE: DATE OF DISCHARGE:  12/07/2015                              OPERATIVE REPORT   PREOPERATIVE DIAGNOSIS:  Prominent bilateral inguinal hernias.  POSTOPERATIVE DIAGNOSIS:  Prominent bilateral direct inguinal hernias.  PROCEDURE:  Placement of TAPP balloon with conversion to TAPP, transabdominal bilateral inguinal hernia repairs with large 3DMax mesh placements and peritoneal closure.  SURGEON:  Thornton Park. Daphine Deutscher, MD.  ASSISTANT:  Roslyn Smiling.  ANESTHESIA:  General endotracheal.  DESCRIPTION OF PROCEDURE:  The patient was taken to room #6 and a Foley was placed and he was prepped with PCMX and draped sterilely after intubation.  Time-out was performed.  Transverse incision was made below the umbilicus and sliding off to the left of midline.  A pocket was made into the anterior rectus sheath along the midline with the muscles retracted laterally.  The balloon dissector was passed down to the pubis and with a 0-degree scope in place, insufflation occurred.  Deployment of the balloon appeared to be going well, although, we were able to fill it up and move it around little bit more.  We removed the balloon.  The balloon appeared to have enter the peritoneal cavity through the linea semilunaris creating a flap.  We elected at this point to convert the midline to a straight in 11-mm balloon tipped trocar and had two 5 mm placed laterally.  Flap creation was enhanced on the left side and carried to the midline and then the dissection was carried down to the pubis with the sac of a large direct hernia dissected free as well.  The epigastric vessels were seen and a small possible left indirect inguinal hernia was dissected free as well.  This exposed the  inguinal structures and the Cooper's ligament, pubis laterally.  On the right side, the flap was created incising the peritoneum and then again dissecting toward the pubis and dissecting out a fairly prominent right inguinal hernia, which was direct and again exposing the pubis and carrying this over to the midline.  When this flap had been adequately established laterally, 3DMax mesh was placed first on the left and secured with the Securestrap stapler medially.  The AM was placed there on the pubis on the left and tacked anteriorly.  The right side was placed as well and the medial portions identified and again were placed with the Securestrap.  The peritoneal flaps were then closed over the mesh with the Securestrap and good coverage was present.  We looked at this transabdominally as we had throughout the case and there was no bleeding noted and the mesh was adequately covered.  No mesh was exposed.  I surveyed the abdomen, I did not see anything amiss in the right or left upper quadrant, although the left lateral diaphragm was noted to maybe be a little attenuated.  Exparel was injected into the ports over the midline in the right side and then the abdomen was deflated.  The incisions were approximated with 4-0 Vicryl and  LiquiBand.  The patient tolerated the procedure well, was taken to the recovery room in satisfactory condition.  FINAL DIAGNOSIS:  Bilateral direct inguinal hernias, status post transabdominal preperitoneal procedure.     Thornton Park Daphine Deutscher, MD     MBM/MEDQ  D:  12/07/2015  T:  12/08/2015  Job:  [id number]

## 2016-06-18 ENCOUNTER — Ambulatory Visit: Payer: BLUE CROSS/BLUE SHIELD | Admitting: Physician Assistant

## 2016-06-20 ENCOUNTER — Telehealth: Payer: Self-pay | Admitting: Emergency Medicine

## 2016-06-20 NOTE — Telephone Encounter (Signed)
Pt called and asked if you would be willing to see him as a new patient? Please advise thanks.

## 2016-06-21 ENCOUNTER — Ambulatory Visit: Payer: Self-pay | Admitting: Family Medicine

## 2016-06-22 NOTE — Telephone Encounter (Signed)
Unfortunately, I'm not able to accept any more new patients at this time.  I'm sorry! Thank you!  

## 2016-06-25 ENCOUNTER — Ambulatory Visit: Payer: Self-pay

## 2016-06-25 NOTE — Telephone Encounter (Signed)
Called pt back and left VM letting him know Dr Macario Golds is not able to accept anymore new patients at this time. Thanks.

## 2016-06-28 ENCOUNTER — Ambulatory Visit: Payer: BLUE CROSS/BLUE SHIELD | Admitting: Cardiology

## 2016-07-02 ENCOUNTER — Ambulatory Visit (INDEPENDENT_AMBULATORY_CARE_PROVIDER_SITE_OTHER): Payer: BLUE CROSS/BLUE SHIELD | Admitting: Family Medicine

## 2016-07-02 ENCOUNTER — Emergency Department (HOSPITAL_COMMUNITY)
Admission: EM | Admit: 2016-07-02 | Discharge: 2016-07-03 | Disposition: A | Discharge status: Home / Self Care | Payer: BLUE CROSS/BLUE SHIELD | Attending: Dermatology | Admitting: Dermatology

## 2016-07-02 ENCOUNTER — Ambulatory Visit (INDEPENDENT_AMBULATORY_CARE_PROVIDER_SITE_OTHER): Payer: BLUE CROSS/BLUE SHIELD

## 2016-07-02 ENCOUNTER — Encounter (HOSPITAL_COMMUNITY): Payer: Self-pay

## 2016-07-02 VITALS — BP 118/70 | HR 86 | Temp 97.3°F | Resp 20 | Ht 72.0 in | Wt 194.0 lb

## 2016-07-02 DIAGNOSIS — Z87891 Personal history of nicotine dependence: Secondary | ICD-10-CM | POA: Diagnosis not present

## 2016-07-02 DIAGNOSIS — R0609 Other forms of dyspnea: Secondary | ICD-10-CM | POA: Diagnosis not present

## 2016-07-02 DIAGNOSIS — I498 Other specified cardiac arrhythmias: Secondary | ICD-10-CM

## 2016-07-02 DIAGNOSIS — Z9889 Other specified postprocedural states: Secondary | ICD-10-CM

## 2016-07-02 DIAGNOSIS — R0602 Shortness of breath: Secondary | ICD-10-CM | POA: Insufficient documentation

## 2016-07-02 DIAGNOSIS — R5383 Other fatigue: Secondary | ICD-10-CM

## 2016-07-02 DIAGNOSIS — Z8719 Personal history of other diseases of the digestive system: Secondary | ICD-10-CM | POA: Diagnosis not present

## 2016-07-02 DIAGNOSIS — Z5321 Procedure and treatment not carried out due to patient leaving prior to being seen by health care provider: Secondary | ICD-10-CM | POA: Diagnosis not present

## 2016-07-02 DIAGNOSIS — R609 Edema, unspecified: Secondary | ICD-10-CM

## 2016-07-02 DIAGNOSIS — I10 Essential (primary) hypertension: Secondary | ICD-10-CM | POA: Insufficient documentation

## 2016-07-02 DIAGNOSIS — R9431 Abnormal electrocardiogram [ECG] [EKG]: Secondary | ICD-10-CM

## 2016-07-02 LAB — POCT CBC
Granulocyte percent: 71.6 %G (ref 37–80)
HCT, POC: 40.5 % — AB (ref 43.5–53.7)
Hemoglobin: 14.4 g/dL (ref 14.1–18.1)
LYMPH, POC: 1.3 (ref 0.6–3.4)
MCH, POC: 33.8 pg — AB (ref 27–31.2)
MCHC: 35.7 g/dL — AB (ref 31.8–35.4)
MCV: 94.9 fL (ref 80–97)
MID (CBC): 0.4 (ref 0–0.9)
MPV: 7.1 fL (ref 0–99.8)
PLATELET COUNT, POC: 253 10*3/uL (ref 142–424)
POC Granulocyte: 4.2 (ref 2–6.9)
POC LYMPH %: 21.2 % (ref 10–50)
POC MID %: 7.2 %M (ref 0–12)
RBC: 4.26 M/uL — AB (ref 4.69–6.13)
RDW, POC: 12.9 %
WBC: 5.9 10*3/uL (ref 4.6–10.2)

## 2016-07-02 LAB — BRAIN NATRIURETIC PEPTIDE: B Natriuretic Peptide: 926.8 pg/mL — ABNORMAL HIGH (ref 0.0–100.0)

## 2016-07-02 LAB — CBC
HCT: 42.3 % (ref 39.0–52.0)
Hemoglobin: 14.6 g/dL (ref 13.0–17.0)
MCH: 32.7 pg (ref 26.0–34.0)
MCHC: 34.5 g/dL (ref 30.0–36.0)
MCV: 94.8 fL (ref 78.0–100.0)
PLATELETS: 292 10*3/uL (ref 150–400)
RBC: 4.46 MIL/uL (ref 4.22–5.81)
RDW: 12.6 % (ref 11.5–15.5)
WBC: 5.9 10*3/uL (ref 4.0–10.5)

## 2016-07-02 LAB — BASIC METABOLIC PANEL
Anion gap: 7 (ref 5–15)
BUN: 21 mg/dL — AB (ref 6–20)
CHLORIDE: 107 mmol/L (ref 101–111)
CO2: 25 mmol/L (ref 22–32)
CREATININE: 1.38 mg/dL — AB (ref 0.61–1.24)
Calcium: 9.3 mg/dL (ref 8.9–10.3)
GFR calc Af Amer: >60 mL/min (ref >60)
GFR calc non Af Amer: 53 mL/min — ABNORMAL LOW (ref >60)
Glucose, Bld: 109 mg/dL — ABNORMAL HIGH (ref 65–99)
Potassium: 4.1 mmol/L (ref 3.5–5.1)
Sodium: 139 mmol/L (ref 135–145)

## 2016-07-02 LAB — I-STAT TROPONIN, ED: Troponin i, poc: 0.04 ng/mL (ref 0.00–0.08)

## 2016-07-02 LAB — GLUCOSE, POCT (MANUAL RESULT ENTRY): POC Glucose: 95 mg/dl (ref 70–99)

## 2016-07-02 NOTE — ED Notes (Signed)
Pt walking around ER in waiting room and approached me twice about leaving, first time I told him he had been sent by his dr and  That he had heart issues and he could die, witnessed by Angeline registration, this time pt states he will go to his cards dr on wed, againg I  Strongly encouraged him to stay, pt wanted hid paper from dr office and states he is going to leave. Witnessed by [place]  [facility] sec

## 2016-07-02 NOTE — Progress Notes (Addendum)
Subjective:  By signing my name below, I, Stann Ore, attest that this documentation has been prepared under the direction and in the presence of Meredith Staggers, MD. Electronically Signed: Stann Ore, Scribe. 07/02/2016 , 10:27 AM .  Patient was seen in Room 4 .   Patient ID: Bryan Harmon, male    DOB: 1950/11/15, 65 y.o.   MRN: [id number] Chief Complaint  Patient presents with  . Breathing Problem    SOB x 3 months   HPI Bryan Harmon is a 65 y.o. male H/o HTN, previous patient of Dr. Milus Glazier. He was admitted in March of this year for inguinal hernia repair. Here for difficulty breathing for the past 3 months. He was an ultra marathon runner.   Patient complains that he feels fatigue and shortness of breath that started about 3 months ago. He informs having hernia repair about 6 months ago; last visit with surgeon was 3 weeks ago and informed him about the shortness of breath. He was referred to cardiologist, Dr. [name], visit in 2 days. He stopped taking BP medication after his surgery.   Prior to the shortness of breath, he denies much swelling in his legs. He took a long distance travel on Sept 6th to [place], and his shortness of breath worsened while in [place]. He isn't ale to run anymore, as going up stairs makes his short of breath. He mentions legs feeling weak after not being able to run. At night, he has a difficult time sleeping; best position is sleeping on his stomach. He denies history of asthma, or allergies. He denies any use of inhalers in the past. He denies chest pain or chest tightness. He does report having some palpitations recently. He mentions taking viagra for some relief.   Patient Active Problem List   Diagnosis Date Noted  . Essential hypertension, benign 02/27/2012  . Bilateral inguinal hernia 01/25/2010  . 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 Hypertension   . Inguinal hernia, bilateral      Past Surgical History:  Procedure Laterality Date  . APPENDECTOMY  age 61  . DENTAL SURGERY    . INGUINAL HERNIA REPAIR Bilateral 12/07/2015   Procedure: LAPAROSCOPIC BILATERAL INGUINAL HERNIA REPAIR WITH MESH;  Surgeon: Luretha Murphy, MD;  Location: [facility];  Service: General;  Laterality: Bilateral;   No Known Allergies Prior to Admission medications   Medication Sig Start Date End Date Taking? Authorizing Provider  amLODipine (NORVASC) 10 MG tablet Take 1 tablet (10 mg total) by mouth daily. Patient not taking: Reported on 07/02/2016 08/26/14   Elvina Sidle, MD  clotrimazole-betamethasone (LOTRISONE) cream APPLY 1 APPLICATION TOPICALLY 2 (TWO) TIMES DAILY. Patient not taking: Reported on 07/02/2016 02/28/15   Elvina Sidle, MD  hydrochlorothiazide (HYDRODIURIL) 50 MG tablet Take 50 mg by mouth daily. 11/24/15   Historical Provider, MD  HYDROcodone-acetaminophen (NORCO) 5-325 MG tablet Take 1 tablet by mouth every 4 (four) hours as needed for moderate pain. Patient not taking: Reported on 07/02/2016 12/07/15   Luretha Murphy, MD  tadalafil (CIALIS) 20 MG tablet Take 0.5-1 tablets (10-20 mg total) by mouth every other day as needed for erectile dysfunction. Patient not taking: Reported on 07/02/2016 08/26/14   Elvina Sidle, MD   Social History   Social History  . Marital status: Married    Spouse name: N/A  . Number of children: 1  . Years of education: N/A   Occupational History  . physical chemist [employer]  Social History Main Topics  . Smoking status: Former Smoker    Quit date: 09/10/1996  . Smokeless tobacco: Never Used  . Alcohol use Yes     Comment: occasional   . Drug use: No  . Sexual activity: Not on file   Other Topics Concern  . Not on file   Social History Narrative   Married.  Son lives in [place]. Ultra-marathoner   Review of Systems  Constitutional: Positive for fatigue. Negative for unexpected weight change.  Eyes: Negative for visual  disturbance.  Respiratory: Positive for shortness of breath and wheezing. Negative for cough and chest tightness.   Cardiovascular: Positive for palpitations. Negative for chest pain and leg swelling.  Gastrointestinal: Negative for abdominal pain and blood in stool.  Musculoskeletal: Positive for myalgias.  Allergic/Immunologic: Negative for environmental allergies.  Neurological: Negative for dizziness, light-headedness and headaches.       Objective:   Physical Exam  Constitutional: He is oriented to person, place, and time. He appears well-developed and well-nourished.  HENT:  Head: Normocephalic and atraumatic.  Eyes: EOM are normal. Pupils are equal, round, and reactive to light.  Neck: No JVD present. Carotid bruit is not present.  Cardiovascular: Normal heart sounds.  An irregularly irregular rhythm present. Tachycardia present.   No murmur heard. Slight tachycardia with episodes of irregular irregular rhythm, alternating with regular rhythm  Pulmonary/Chest: Effort normal. He has no wheezes. He has rales in the right lower field and the left lower field.  Musculoskeletal: He exhibits no edema.  Calves are non tender without swelling, negative homans   Neurological: He is alert and oriented to person, place, and time.  Skin: Skin is warm and dry.  Psychiatric: He has a normal mood and affect.  Vitals reviewed.   Vitals:   07/02/16 0917  BP: 118/70  Pulse: 86  Resp: 20  Temp: 97.3 F (36.3 C)  TempSrc: Oral  SpO2: 99%  Weight: 194 lb (88 kg)  Height: 6' (1.829 m)   Dg Chest 2 View  Result Date: 07/02/2016 CLINICAL DATA:  Worsening dyspnea on exertion for 3 months. Fatigue. Hypertension. EXAM: CHEST  2 VIEW COMPARISON:  10/08/2012 FINDINGS: Moderate cardiomegaly and ectasia the thoracic aorta are unchanged. New mild diffuse interstitial prominence with Kerley B-lines seen, suspicious for mild interstitial edema. No evidence of pulmonary consolidation or pleural  effusion. Mild pulmonary hyperinflation again demonstrated. IMPRESSION: Probable mild diffuse pulmonary interstitial edema. No evidence of pulmonary consolidation or pleural effusion. Stable cardiomegaly and aortic ectasia. Electronically Signed   By: Myles Rosenthal M.D.   On: 07/02/2016 10:40   EKG: appears to have a junctional rhythm, with frequent PVC's including trigeminy; alternating with bigeminy at times, he does have some T-wave inversion and ST depression on the lateral leads  Results for orders placed or performed in visit on 07/02/16  POCT CBC  Result Value Ref Range   WBC 5.9 4.6 - 10.2 K/uL   Lymph, poc 1.3 0.6 - 3.4   POC LYMPH PERCENT 21.2 10 - 50 %L   MID (cbc) 0.4 0 - 0.9   POC MID % 7.2 0 - 12 %M   POC Granulocyte 4.2 2 - 6.9   Granulocyte percent 71.6 37 - 80 %G   RBC 4.26 (A) 4.69 - 6.13 M/uL   Hemoglobin 14.4 14.1 - 18.1 g/dL   HCT, POC 16.1 (A) 09.6 - 53.7 %   MCV 94.9 80 - 97 fL   MCH, POC 33.8 (A) 27 - 31.2  pg   MCHC 35.7 (A) 31.8 - 35.4 g/dL   RDW, POC 16.112.9 %   Platelet Count, POC 253 142 - 424 K/uL   MPV 7.1 0 - 99.8 fL  POCT glucose (manual entry)  Result Value Ref Range   POC Glucose 95 70 - 99 mg/dl   Cardiologist was paged and discussed patient's EKG, chest x-ray and symptoms. Over 40 minutes total time with patient with coordination of care and history/planning.    Assessment & Plan:  Bryan Harmon is a 65 y.o. male Dyspnea on exertion - Plan: EKG 12-Lead, DG Chest 2 View, CANCELED: D-dimer, quantitative (not at [facility]), CANCELED: Brain natriuretic peptide  Other fatigue - Plan: POCT CBC, POCT glucose (manual entry), DG Chest 2 View  History of hernia surgery  Nonspecific abnormal electrocardiogram (ECG) (EKG)  Junctional rhythm  Edema, unspecified type  Previous endurance athlete with 3 month history of dyspnea even with walking. Episodic palpitations but denies chest pain. Significant change in his EKG versus previous reading with now  possible junctional rhythm versus A. fib and some ST/T-wave changes. At times appears to have bigeminal/trigeminal rhythm.   - Discussed with cardiologist, and based on his current symptoms as well as some signs of failure on chest x-ray, will have him evaluated through emergency room for probable admission.  - As his symptoms have been present for 3 months, and actually somewhat subjectively improved today, sent by private vehicle, but 911 precautions were discussed.     -Initially ordered BNP and d-dimer (recent plane travel and surgery 6 months ago, but did have shortness of breath prior to plane travel), but this will be checked in the hospital so canceled outpatient.  No orders of the defined types were placed in this encounter.  Patient Instructions    I discussed your EKG with a cardiologist, and based on the heart rhythms seen as well as your chest xray, recommend evaluation through the emergency room for probable admission. After leaving our office, go to [facility] emergency room. If you have any new or worsening symptoms prior to arrival to ER, pull over and call 911.   IF you received an x-ray today, you will receive an invoice from [facility]. Please contact [facility] at [phone] with questions or concerns regarding your invoice.   IF you received labwork today, you will receive an invoice from [facility]. Please contact [facility] at [phone] with questions or concerns regarding your invoice.   Our billing staff will not be able to assist you with questions regarding bills from these companies.  You will be contacted with the lab results as soon as they are available. The fastest way to get your results is to activate your My Chart account. Instructions are located on the last page of this paperwork. If you have not heard from us regarding the results in 2 weeks, please contact this office.        I personally performed the  services described in this documentation, which was scribed in my presence. The recorded information has been reviewed and considered, and addended by me as needed.   Signed,   Meredith StaggersJeffrey Sangita Zani, MD [facility].  07/02/16 11:24 AM

## 2016-07-02 NOTE — Patient Instructions (Addendum)
  I discussed your EKG with a cardiologist, and based on the heart rhythms seen as well as your chest xray, recommend evaluation through the emergency room for probable admission. After leaving our office, go to [facility] emergency room. If you have any new or worsening symptoms prior to arrival to ER, pull over and call 911.   IF you received an x-ray today, you will receive an invoice from [facility]. Please contact [facility] at [phone] with questions or concerns regarding your invoice.   IF you received labwork today, you will receive an invoice from [facility]. Please contact [facility] at [phone] with questions or concerns regarding your invoice.   Our billing staff will not be able to assist you with questions regarding bills from these companies.  You will be contacted with the lab results as soon as they are available. The fastest way to get your results is to activate your My Chart account. Instructions are located on the last page of this paperwork. If you have not heard from [place] regarding the results in 2 weeks, please contact this office.

## 2016-07-02 NOTE — Progress Notes (Signed)
95

## 2016-07-04 ENCOUNTER — Other Ambulatory Visit (HOSPITAL_COMMUNITY): Payer: Self-pay | Admitting: *Deleted

## 2016-07-04 ENCOUNTER — Encounter (HOSPITAL_COMMUNITY): Payer: Self-pay

## 2016-07-04 ENCOUNTER — Ambulatory Visit (HOSPITAL_BASED_OUTPATIENT_CLINIC_OR_DEPARTMENT_OTHER)
Admission: RE | Admit: 2016-07-04 | Discharge: 2016-07-04 | Disposition: A | Discharge status: Home / Self Care | Payer: BLUE CROSS/BLUE SHIELD | Source: Ambulatory Visit | Attending: Cardiology | Admitting: Cardiology

## 2016-07-04 ENCOUNTER — Ambulatory Visit (HOSPITAL_COMMUNITY)
Admission: RE | Admit: 2016-07-04 | Discharge: 2016-07-04 | Disposition: A | Discharge status: Home / Self Care | Payer: BLUE CROSS/BLUE SHIELD | Source: Ambulatory Visit | Attending: Cardiology | Admitting: Cardiology

## 2016-07-04 ENCOUNTER — Encounter (HOSPITAL_COMMUNITY): Payer: Self-pay | Admitting: *Deleted

## 2016-07-04 ENCOUNTER — Ambulatory Visit: Payer: BLUE CROSS/BLUE SHIELD | Admitting: Physician Assistant

## 2016-07-04 VITALS — BP 142/90 | HR 91 | Wt 195.0 lb

## 2016-07-04 DIAGNOSIS — R0600 Dyspnea, unspecified: Secondary | ICD-10-CM

## 2016-07-04 DIAGNOSIS — Z7982 Long term (current) use of aspirin: Secondary | ICD-10-CM | POA: Insufficient documentation

## 2016-07-04 DIAGNOSIS — I351 Nonrheumatic aortic (valve) insufficiency: Secondary | ICD-10-CM | POA: Diagnosis not present

## 2016-07-04 DIAGNOSIS — I5023 Acute on chronic systolic (congestive) heart failure: Secondary | ICD-10-CM | POA: Diagnosis not present

## 2016-07-04 DIAGNOSIS — Z87891 Personal history of nicotine dependence: Secondary | ICD-10-CM | POA: Insufficient documentation

## 2016-07-04 DIAGNOSIS — N529 Male erectile dysfunction, unspecified: Secondary | ICD-10-CM | POA: Insufficient documentation

## 2016-07-04 DIAGNOSIS — I493 Ventricular premature depolarization: Secondary | ICD-10-CM | POA: Insufficient documentation

## 2016-07-04 DIAGNOSIS — I5021 Acute systolic (congestive) heart failure: Secondary | ICD-10-CM | POA: Diagnosis not present

## 2016-07-04 DIAGNOSIS — I071 Rheumatic tricuspid insufficiency: Secondary | ICD-10-CM | POA: Diagnosis not present

## 2016-07-04 DIAGNOSIS — I429 Cardiomyopathy, unspecified: Secondary | ICD-10-CM | POA: Diagnosis not present

## 2016-07-04 DIAGNOSIS — I340 Nonrheumatic mitral (valve) insufficiency: Secondary | ICD-10-CM | POA: Insufficient documentation

## 2016-07-04 DIAGNOSIS — I110 Hypertensive heart disease with heart failure: Secondary | ICD-10-CM | POA: Diagnosis not present

## 2016-07-04 DIAGNOSIS — I272 Pulmonary hypertension, unspecified: Secondary | ICD-10-CM | POA: Diagnosis not present

## 2016-07-04 LAB — ECHOCARDIOGRAM COMPLETE
Ao-asc: 38 cm
E decel time: 137 ms
E/e' ratio: 18.6
FS: 5 % — AB (ref 28–44)
IVS/LV PW RATIO, ED: 1.15
LA ID, A-P, ES: 62 mm
LA diam end sys: 62 mm
LA diam index: 2.94 cm/m2
LA vol A4C: 184 mL
LA vol index: 82 mL/m2
LA vol: 173 mL
LV E/e' medial: 18.6
LV E/e'average: 18.6
LV PW d: 13 mm — AB (ref 0.6–1.1)
LVOT SV: 53 mL
LVOT VTI: 12.8 cm
LVOT area: 4.15 cm2
LVOT diameter: 23 mm
LVOT peak vel: 80.1 cm/s
Lateral S' vel: 10.1 cm/s
MV Dec: 137
MV Peak grad: 3 mmHg
MV pk E vel: 93 m/s
PISA EROA: 0.19 cm2
RV sys press: 76 mmHg
Reg peak vel: 389 cm/s
TAPSE: 18.6 mm
TDI e' medial: 5
TR max vel: 389 cm/s
VTI: 187 cm
Weight: 3120 [oz_av]

## 2016-07-04 LAB — D-DIMER, QUANTITATIVE: D-Dimer, Quant: 1.28 ug/mL-FEU — ABNORMAL HIGH (ref 0.00–0.50)

## 2016-07-04 MED ORDER — FUROSEMIDE 40 MG PO TABS: 40.0000 mg | ORAL_TABLET | Freq: Every day | ORAL | 3 refills | Status: DC

## 2016-07-04 MED ORDER — POTASSIUM CHLORIDE CRYS ER 20 MEQ PO TBCR: 20.0000 meq | EXTENDED_RELEASE_TABLET | Freq: Every day | ORAL | 3 refills | Status: DC

## 2016-07-04 MED ORDER — ASPIRIN EC 81 MG PO TBEC: 81.0000 mg | DELAYED_RELEASE_TABLET | Freq: Every day | ORAL | 3 refills | Status: DC

## 2016-07-04 MED ORDER — SACUBITRIL-VALSARTAN 24-26 MG PO TABS: 1.0000 | ORAL_TABLET | Freq: Two times a day (BID) | ORAL | 3 refills | Status: DC

## 2016-07-04 NOTE — Progress Notes (Signed)
  Echocardiogram 2D Echocardiogram has been performed.  Janalyn Harder 07/04/2016, 12:20 PM

## 2016-07-04 NOTE — Patient Instructions (Addendum)
Start Furosemide 40 mg daily  Start K-dur 20 meq daily  Start Aspirin 81 mg daily  Lab today  Your physician has recommended that you wear a holter monitor. Holter monitors are medical devices that record the heart's electrical activity. Doctors most often use these monitors to diagnose arrhythmias. Arrhythmias are problems with the speed or rhythm of the heartbeat. The monitor is a small, portable device. You can wear one while you do your normal daily activities. This is usually used to diagnose what is causing palpitations/syncope (passing out).  Your physician has requested that you have an echocardiogram. Echocardiography is a painless test that uses sound waves to create images of your heart. It provides your doctor with information about the size and shape of your heart and how well your heart's chambers and valves are working. This procedure takes approximately one hour. There are no restrictions for this procedure.  Your physician recommends that you schedule a follow-up appointment in: Friday

## 2016-07-04 NOTE — Progress Notes (Signed)
PCP: Dr. Neva Seat Cardiology: Dr [name]  65 yo with history of HTN presents for cardiology evaluation due to exertional dyspnea and PVCs.  Patient has no prior cardiac history.  He used to be an Museum/gallery curator until about 3 years ago, he stopped due to symptomatic inguinal hernias.  In 3/17, he had bilateral inguinal hernia repairs. Around 6/17, he began to develop exertional dyspnea.  There was no inciting event.  This has become gradually progressive and is now quite significant.  He has had a normal appetite but has early satiety.  He has actually lost about 10 lbs.  He took a trip to [place] in 9/17 and had a really hard time walking around and keeping up.  He has noted dyspnea walking up stairs and hills.  Much less stamina when he tries to run.  Now, rather than jogging, he has to jog a short distance then walk.  He is very short of breath with jogging.  He has to catch his breath between sentences.  No chest pain or pressure.  He has frequent PVCs by ECGs but does not feel palpitations.  No lightheadedness or syncope.   He saw his PCP yesterday and CXR showed pulmonary edema.  He was sent to the ER, but he left the ER it appears before full evaluation.  BNP elevated.   I had him do an echo right after today's visit.  I reviewed it, EF appears to be in the 15-20% range with global hypokinesis and moderate to possibly severe mitral regurgitation.  He had difficulty with lying flat for the echo.   I talked with him about hospital admission for workup and diuresis.  He does not want to come into the hospital today but is willing to come back for RHC/LHC tomorrow.   ECG: 2 ECGs done yesterday, both showed NSR with PVCs and LVH.   Labs (10/17): K 4.1, creatinine 1.38, hgb 14.4, BNP 927  PMH: 1. H/o inguinal hernia repair 3/17.  2. HTN 3. 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  Quit smoking 23  years ago.  Ultra-marathoner until 3 years ago.   FH: No heart problems that he knows of.   ROS: All systems reviewed and negative except as per HPI.   Current Outpatient Prescriptions  Medication Sig Dispense Refill  . acetaminophen (TYLENOL) 500 MG tablet Take 500 mg by mouth every 6 (six) hours as needed (for pain. (SELDOM)).    Marland Kitchen aspirin EC 81 MG tablet Take 1 tablet (81 mg total) by mouth daily. 90 tablet 3  . furosemide (LASIX) 40 MG tablet Take 1 tablet (40 mg total) by mouth daily. 30 tablet 3  . potassium chloride SA (K-DUR,KLOR-CON) 20 MEQ tablet Take 1 tablet (20 mEq total) by mouth daily. 30 tablet 3  . sacubitril-valsartan (ENTRESTO) 24-26 MG Take 1 tablet by mouth 2 (two) times daily. 60 tablet 3   No current facility-administered medications for this encounter.    BP (!) 142/90   Pulse 91   Wt 195 lb (88.5 kg)   SpO2 98%   BMI 26.45 kg/m  General: mildly short of breath with talking Neck: JVP 10-12 cm, no thyromegaly or thyroid nodule.  Lungs: Clear to auscultation bilaterally with normal respiratory effort. CV: Nondisplaced PMI.  Heart with occasional irregularity c/w PVCs, 2/6 HSM at apex.  1+ ankle edema.  No carotid bruit.  Normal pedal pulses.  Abdomen: Soft,  nontender, no hepatosplenomegaly, no distention.  Skin: Intact without lesions or rashes.  Neurologic: Alert and oriented x 3.  Psych: Normal affect. Extremities: No clubbing or cyanosis.  HEENT: Normal.   Assessment/Plan: 1. Acute on chronic systolic CHF: Echo was reviewed in the office today, EF appears to be in the 15-20% range with global hypokinesis and moderate to possibly severe MR.  He is volume overloaded on exam with NYHA class III symptoms.  Etiology of his CHF is not clear => no definite inciting event.  No exertional chest pain.  He needs diagnostic workup and diuresis.  I recommended admission to the hospital today but he wants to go home.  He agrees to start Lasix + Entresto and return  tomorrow for cath.  - I will plan on doing right and left heart cath tomorrow for diagnostics purposes (assess for CAD as cause of cardiomyopathy, assess filling pressures and cardiac output).  I discussed risks/benefits and he agrees to proceed. He agrees to come into the hospital if RHC or LHC findings suggest need for hospitalization.  - Start Lasix 40 mg daily with KCl 20 daily.  - Start Entresto 24/26 bid.  - Start ASA 81 mg daily as we have not ruled out CAD.  - Will not start beta blocker yet with significant volume overload/pulmonary edema. - If coronary angiography does not show coronary disease, he will need cardiac MRI to assess for infiltrative disease as well as workup with SPEP/UPEP, etc, for nonischemic cardiomyopathy.  Would also consider PVC-mediated CMP given frequent PVCs.  - Elevated D dimer noted => sent off before echo was done.  I think that his problem is acute systolic CHF rather than a PE.  2. PVCs: Frequent PVCs.  He does not feel them.  It is possible that he has a PVC-mediated CMP, but PVCs also could just be a product of the cardiomyopathy itself.  He needs to get on a beta blocker once his volume overload is controlled. He will eventually need 48 hour holter to quantify PVCs.   Anika Shore 07/04/2016     

## 2016-07-05 ENCOUNTER — Encounter (HOSPITAL_COMMUNITY)
Admission: RE | Disposition: A | Discharge status: Home / Self Care | Payer: Self-pay | Source: Ambulatory Visit | Attending: Cardiology

## 2016-07-05 ENCOUNTER — Ambulatory Visit (HOSPITAL_COMMUNITY)
Admission: RE | Admit: 2016-07-05 | Discharge: 2016-07-05 | Disposition: A | Discharge status: Home / Self Care | Payer: BLUE CROSS/BLUE SHIELD | Source: Ambulatory Visit | Attending: Cardiology | Admitting: Cardiology

## 2016-07-05 DIAGNOSIS — I493 Ventricular premature depolarization: Secondary | ICD-10-CM | POA: Diagnosis not present

## 2016-07-05 DIAGNOSIS — I428 Other cardiomyopathies: Secondary | ICD-10-CM | POA: Diagnosis not present

## 2016-07-05 DIAGNOSIS — Z87891 Personal history of nicotine dependence: Secondary | ICD-10-CM | POA: Diagnosis not present

## 2016-07-05 DIAGNOSIS — I110 Hypertensive heart disease with heart failure: Secondary | ICD-10-CM | POA: Diagnosis not present

## 2016-07-05 DIAGNOSIS — I2510 Atherosclerotic heart disease of native coronary artery without angina pectoris: Secondary | ICD-10-CM | POA: Diagnosis not present

## 2016-07-05 DIAGNOSIS — I5021 Acute systolic (congestive) heart failure: Secondary | ICD-10-CM

## 2016-07-05 DIAGNOSIS — I5023 Acute on chronic systolic (congestive) heart failure: Secondary | ICD-10-CM | POA: Diagnosis not present

## 2016-07-05 DIAGNOSIS — Z7982 Long term (current) use of aspirin: Secondary | ICD-10-CM | POA: Insufficient documentation

## 2016-07-05 DIAGNOSIS — I429 Cardiomyopathy, unspecified: Secondary | ICD-10-CM | POA: Diagnosis not present

## 2016-07-05 DIAGNOSIS — N529 Male erectile dysfunction, unspecified: Secondary | ICD-10-CM | POA: Insufficient documentation

## 2016-07-05 HISTORY — PX: CARDIAC CATHETERIZATION: SHX172

## 2016-07-05 LAB — POCT I-STAT 3, VENOUS BLOOD GAS (G3P V)
Acid-base deficit: 1 mmol/L (ref 0.0–2.0)
Acid-base deficit: 2 mmol/L (ref 0.0–2.0)
BICARBONATE: 22 mmol/L (ref 20.0–28.0)
BICARBONATE: 22.9 mmol/L (ref 20.0–28.0)
O2 SAT: 61 %
O2 Saturation: 63 %
PCO2 VEN: 35.6 mmHg — AB (ref 44.0–60.0)
TCO2: 23 mmol/L (ref 0–100)
TCO2: 24 mmol/L (ref 0–100)
pCO2, Ven: 33.8 mmHg — ABNORMAL LOW (ref 44.0–60.0)
pH, Ven: 7.417 (ref 7.250–7.430)
pH, Ven: 7.421 (ref 7.250–7.430)
pO2, Ven: 31 mmHg — CL (ref 32.0–45.0)
pO2, Ven: 32 mmHg (ref 32.0–45.0)

## 2016-07-05 LAB — BASIC METABOLIC PANEL
Anion gap: 8 (ref 5–15)
BUN: 15 mg/dL (ref 6–20)
CALCIUM: 8.8 mg/dL — AB (ref 8.9–10.3)
CO2: 25 mmol/L (ref 22–32)
CREATININE: 1.33 mg/dL — AB (ref 0.61–1.24)
Chloride: 106 mmol/L (ref 101–111)
GFR, EST NON AFRICAN AMERICAN: 55 mL/min — AB (ref >60)
GLUCOSE: 114 mg/dL — AB (ref 65–99)
Potassium: 3.5 mmol/L (ref 3.5–5.1)
SODIUM: 139 mmol/L (ref 135–145)

## 2016-07-05 LAB — CBC
HCT: 43.9 % (ref 39.0–52.0)
HEMOGLOBIN: 15.3 g/dL (ref 13.0–17.0)
MCH: 33 pg (ref 26.0–34.0)
MCHC: 34.9 g/dL (ref 30.0–36.0)
MCV: 94.8 fL (ref 78.0–100.0)
PLATELETS: 259 10*3/uL (ref 150–400)
RBC: 4.63 MIL/uL (ref 4.22–5.81)
RDW: 12.6 % (ref 11.5–15.5)
WBC: 6.1 10*3/uL (ref 4.0–10.5)

## 2016-07-05 LAB — PROTIME-INR
INR: 1.1
Prothrombin Time: 14.2 seconds (ref 11.4–15.2)

## 2016-07-05 SURGERY — RIGHT/LEFT HEART CATH AND CORONARY ANGIOGRAPHY

## 2016-07-05 MED ORDER — MIDAZOLAM HCL 2 MG/2ML IJ SOLN
INTRAMUSCULAR | Status: AC
  Filled 2016-07-05: qty 2

## 2016-07-05 MED ORDER — LIDOCAINE HCL (PF) 1 % IJ SOLN
INTRAMUSCULAR | Status: AC
  Filled 2016-07-05: qty 30

## 2016-07-05 MED ORDER — FENTANYL CITRATE (PF) 100 MCG/2ML IJ SOLN
INTRAMUSCULAR | Status: AC
  Filled 2016-07-05: qty 2

## 2016-07-05 MED ORDER — MIDAZOLAM HCL 2 MG/2ML IJ SOLN
INTRAMUSCULAR | Status: DC | PRN
  Administered 2016-07-05: 1 mg via INTRAVENOUS

## 2016-07-05 MED ORDER — IOPAMIDOL (ISOVUE-370) INJECTION 76%
INTRAVENOUS | Status: AC
  Filled 2016-07-05: qty 100

## 2016-07-05 MED ORDER — HEPARIN SODIUM (PORCINE) 1000 UNIT/ML IJ SOLN
INTRAMUSCULAR | Status: AC
  Filled 2016-07-05: qty 1

## 2016-07-05 MED ORDER — LIDOCAINE HCL (PF) 1 % IJ SOLN
INTRAMUSCULAR | Status: DC | PRN
  Administered 2016-07-05: 4 mL

## 2016-07-05 MED ORDER — HEPARIN (PORCINE) IN NACL 2-0.9 UNIT/ML-% IJ SOLN
INTRAMUSCULAR | Status: AC
Start: 2016-07-05 — End: 2016-07-05
  Filled 2016-07-05: qty 1000

## 2016-07-05 MED ORDER — ANGIOPLASTY BOOK
Freq: Once | Status: DC
  Filled 2016-07-05: qty 1

## 2016-07-05 MED ORDER — SODIUM CHLORIDE 0.9 % IV SOLN: 250.0000 mL | INTRAVENOUS | Status: DC | PRN

## 2016-07-05 MED ORDER — IOPAMIDOL (ISOVUE-370) INJECTION 76%
INTRAVENOUS | Status: AC
  Filled 2016-07-05: qty 50

## 2016-07-05 MED ORDER — HEPARIN (PORCINE) IN NACL 2-0.9 UNIT/ML-% IJ SOLN
INTRAMUSCULAR | Status: DC | PRN
  Administered 2016-07-05: 1000 mL

## 2016-07-05 MED ORDER — ONDANSETRON HCL 4 MG/2ML IJ SOLN: 4.0000 mg | Freq: Four times a day (QID) | INTRAMUSCULAR | Status: DC | PRN

## 2016-07-05 MED ORDER — SODIUM CHLORIDE 0.9% FLUSH: 3.0000 mL | INTRAVENOUS | Status: DC | PRN

## 2016-07-05 MED ORDER — ACETAMINOPHEN 325 MG PO TABS: 650.0000 mg | ORAL_TABLET | ORAL | Status: DC | PRN

## 2016-07-05 MED ORDER — VERAPAMIL HCL 2.5 MG/ML IV SOLN
INTRAVENOUS | Status: DC | PRN
  Administered 2016-07-05: 10 mL via INTRA_ARTERIAL

## 2016-07-05 MED ORDER — SODIUM CHLORIDE 0.9% FLUSH: 3.0000 mL | Freq: Two times a day (BID) | INTRAVENOUS | Status: DC

## 2016-07-05 MED ORDER — VERAPAMIL HCL 2.5 MG/ML IV SOLN
INTRAVENOUS | Status: AC
  Filled 2016-07-05: qty 2

## 2016-07-05 MED ORDER — ASPIRIN 81 MG PO CHEW: 81.0000 mg | CHEWABLE_TABLET | ORAL | Status: DC

## 2016-07-05 MED ORDER — SODIUM CHLORIDE 0.9 % WEIGHT BASED INFUSION: 1.0000 mL/kg/h | INTRAVENOUS | Status: AC

## 2016-07-05 MED ORDER — FENTANYL CITRATE (PF) 100 MCG/2ML IJ SOLN
INTRAMUSCULAR | Status: DC | PRN
  Administered 2016-07-05: 25 ug via INTRAVENOUS

## 2016-07-05 MED ORDER — SODIUM CHLORIDE 0.9 % IV SOLN: INTRAVENOUS | Status: DC

## 2016-07-05 MED ORDER — SODIUM CHLORIDE 0.9 % IV SOLN
INTRAVENOUS | Status: DC
  Administered 2016-07-05: 08:00:00 via INTRAVENOUS

## 2016-07-05 MED ORDER — HEPARIN SODIUM (PORCINE) 1000 UNIT/ML IJ SOLN
INTRAMUSCULAR | Status: DC | PRN
  Administered 2016-07-05: 4500 [IU] via INTRAVENOUS

## 2016-07-05 SURGICAL SUPPLY — 16 items
CATH BALLN WEDGE 5F 110CM (CATHETERS) ×2 IMPLANT
CATH INFINITI 5 FR 3DRC (CATHETERS) ×2 IMPLANT
CATH INFINITI 5 FR JL3.5 (CATHETERS) ×2 IMPLANT
CATH INFINITI 5FR AL1 (CATHETERS) ×2 IMPLANT
CATH INFINITI JR4 5F (CATHETERS) ×2 IMPLANT
CATH LAUNCHER 5F RADR (CATHETERS) IMPLANT
CATHETER LAUNCHER 5F RADR (CATHETERS) ×3
DEVICE RAD COMP TR BAND LRG (VASCULAR PRODUCTS) ×2 IMPLANT
GLIDESHEATH SLEND SS 6F .021 (SHEATH) ×2 IMPLANT
KIT HEART LEFT (KITS) ×3 IMPLANT
PACK CARDIAC CATHETERIZATION (CUSTOM PROCEDURE TRAY) ×3 IMPLANT
SHEATH FAST CATH BRACH 5F 5CM (SHEATH) ×2 IMPLANT
TRANSDUCER W/STOPCOCK (MISCELLANEOUS) ×4 IMPLANT
TUBING CIL FLEX 10 FLL-RA (TUBING) ×3 IMPLANT
WIRE HI TORQ VERSACORE-J 145CM (WIRE) ×2 IMPLANT
WIRE SAFE-T 1.5MM-J .035X260CM (WIRE) ×2 IMPLANT

## 2016-07-05 NOTE — Interval H&P Note (Signed)
History and Physical Interval Note:  07/05/2016 9:29 AM  Bryan Harmon  has presented today for surgery, with the diagnosis of HF  The various methods of treatment have been discussed with the patient and family. After consideration of risks, benefits and other options for treatment, the patient has consented to  Procedure(s): Right/Left Heart Cath and Coronary Angiography (N/A) as a surgical intervention .  The patient's history has been reviewed, patient examined, no change in status, stable for surgery.  I have reviewed the patient's chart and labs.  Questions were answered to the patient's satisfaction.     Stuart Guillen [employer]

## 2016-07-05 NOTE — H&P (View-Only) (Signed)
PCP: Dr. Neva Seat Cardiology: Dr [name]  65 yo with history of HTN presents for cardiology evaluation due to exertional dyspnea and PVCs.  Patient has no prior cardiac history.  He used to be an Museum/gallery curator until about 3 years ago, he stopped due to symptomatic inguinal hernias.  In 3/17, he had bilateral inguinal hernia repairs. Around 6/17, he began to develop exertional dyspnea.  There was no inciting event.  This has become gradually progressive and is now quite significant.  He has had a normal appetite but has early satiety.  He has actually lost about 10 lbs.  He took a trip to [place] in 9/17 and had a really hard time walking around and keeping up.  He has noted dyspnea walking up stairs and hills.  Much less stamina when he tries to run.  Now, rather than jogging, he has to jog a short distance then walk.  He is very short of breath with jogging.  He has to catch his breath between sentences.  No chest pain or pressure.  He has frequent PVCs by ECGs but does not feel palpitations.  No lightheadedness or syncope.   He saw his PCP yesterday and CXR showed pulmonary edema.  He was sent to the ER, but he left the ER it appears before full evaluation.  BNP elevated.   I had him do an echo right after today's visit.  I reviewed it, EF appears to be in the 15-20% range with global hypokinesis and moderate to possibly severe mitral regurgitation.  He had difficulty with lying flat for the echo.   I talked with him about hospital admission for workup and diuresis.  He does not want to come into the hospital today but is willing to come back for RHC/LHC tomorrow.   ECG: 2 ECGs done yesterday, both showed NSR with PVCs and LVH.   Labs (10/17): K 4.1, creatinine 1.38, hgb 14.4, BNP 927  PMH: 1. H/o inguinal hernia repair 3/17.  2. HTN 3. 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  Quit smoking 23  years ago.  Ultra-marathoner until 3 years ago.   FH: No heart problems that he knows of.   ROS: All systems reviewed and negative except as per HPI.   Current Outpatient Prescriptions  Medication Sig Dispense Refill  . acetaminophen (TYLENOL) 500 MG tablet Take 500 mg by mouth every 6 (six) hours as needed (for pain. (SELDOM)).    Marland Kitchen aspirin EC 81 MG tablet Take 1 tablet (81 mg total) by mouth daily. 90 tablet 3  . furosemide (LASIX) 40 MG tablet Take 1 tablet (40 mg total) by mouth daily. 30 tablet 3  . potassium chloride SA (K-DUR,KLOR-CON) 20 MEQ tablet Take 1 tablet (20 mEq total) by mouth daily. 30 tablet 3  . sacubitril-valsartan (ENTRESTO) 24-26 MG Take 1 tablet by mouth 2 (two) times daily. 60 tablet 3   No current facility-administered medications for this encounter.    BP (!) 142/90   Pulse 91   Wt 195 lb (88.5 kg)   SpO2 98%   BMI 26.45 kg/m  General: mildly short of breath with talking Neck: JVP 10-12 cm, no thyromegaly or thyroid nodule.  Lungs: Clear to auscultation bilaterally with normal respiratory effort. CV: Nondisplaced PMI.  Heart with occasional irregularity c/w PVCs, 2/6 HSM at apex.  1+ ankle edema.  No carotid bruit.  Normal pedal pulses.  Abdomen: Soft,  nontender, no hepatosplenomegaly, no distention.  Skin: Intact without lesions or rashes.  Neurologic: Alert and oriented x 3.  Psych: Normal affect. Extremities: No clubbing or cyanosis.  HEENT: Normal.   Assessment/Plan: 1. Acute on chronic systolic CHF: Echo was reviewed in the office today, EF appears to be in the 15-20% range with global hypokinesis and moderate to possibly severe MR.  He is volume overloaded on exam with NYHA class III symptoms.  Etiology of his CHF is not clear => no definite inciting event.  No exertional chest pain.  He needs diagnostic workup and diuresis.  I recommended admission to the hospital today but he wants to go home.  He agrees to start Lasix + Entresto and return  tomorrow for cath.  - I will plan on doing right and left heart cath tomorrow for diagnostics purposes (assess for CAD as cause of cardiomyopathy, assess filling pressures and cardiac output).  I discussed risks/benefits and he agrees to proceed. He agrees to come into the hospital if RHC or LHC findings suggest need for hospitalization.  - Start Lasix 40 mg daily with KCl 20 daily.  - Start Entresto 24/26 bid.  - Start ASA 81 mg daily as we have not ruled out CAD.  - Will not start beta blocker yet with significant volume overload/pulmonary edema. - If coronary angiography does not show coronary disease, he will need cardiac MRI to assess for infiltrative disease as well as workup with SPEP/UPEP, etc, for nonischemic cardiomyopathy.  Would also consider PVC-mediated CMP given frequent PVCs.  - Elevated D dimer noted => sent off before echo was done.  I think that his problem is acute systolic CHF rather than a PE.  2. PVCs: Frequent PVCs.  He does not feel them.  It is possible that he has a PVC-mediated CMP, but PVCs also could just be a product of the cardiomyopathy itself.  He needs to get on a beta blocker once his volume overload is controlled. He will eventually need 48 hour holter to quantify PVCs.   Marca AnconaDalton Tagen Brethauer 07/04/2016

## 2016-07-05 NOTE — Discharge Instructions (Signed)
Radial Site Care °Refer to this sheet in the next few weeks. These instructions provide you with information about caring for yourself after your procedure. Your health care provider may also give you more specific instructions. Your treatment has been planned according to current medical practices, but problems sometimes occur. Call your health care provider if you have any problems or questions after your procedure. °WHAT TO EXPECT AFTER THE PROCEDURE °After your procedure, it is typical to have the following: °· Bruising at the radial site that usually fades within 1-2 weeks. °· Blood collecting in the tissue (hematoma) that may be painful to the touch. It should usually decrease in size and tenderness within 1-2 weeks. °HOME CARE INSTRUCTIONS °· Take medicines only as directed by your health care provider. °· You may shower 24-48 hours after the procedure or as directed by your health care provider. Remove the bandage (dressing) and gently wash the site with plain soap and water. Pat the area dry with a clean towel. Do not rub the site, because this may cause bleeding. °· Do not take baths, swim, or use a hot tub until your health care provider approves. °· Check your insertion site every day for redness, swelling, or drainage. °· Do not apply powder or lotion to the site. °· Do not flex or bend the affected arm for 24 hours or as directed by your health care provider. °· Do not push or pull heavy objects with the affected arm for 24 hours or as directed by your health care provider. °· Do not lift over 10 lb (4.5 kg) for 5 days after your procedure or as directed by your health care provider. °· Ask your health care provider when it is okay to: °¨ Return to work or school. °¨ Resume usual physical activities or sports. °¨ Resume sexual activity. °· Do not drive home if you are discharged the same day as the procedure. Have someone else drive you. °· You may drive 24 hours after the procedure unless otherwise  instructed by your health care provider. °· Do not operate machinery or power tools for 24 hours after the procedure. °· If your procedure was done as an outpatient procedure, which means that you went home the same day as your procedure, a responsible adult should be with you for the first 24 hours after you arrive home. °· Keep all follow-up visits as directed by your health care provider. This is important. °SEEK MEDICAL CARE IF: °· You have a fever. °· You have chills. °· You have increased bleeding from the radial site. Hold pressure on the site. °SEEK IMMEDIATE MEDICAL CARE IF: °· You have unusual pain at the radial site. °· You have redness, warmth, or swelling at the radial site. °· You have drainage (other than a small amount of blood on the dressing) from the radial site. °· The radial site is bleeding, and the bleeding does not stop after 30 minutes of holding steady pressure on the site. °· Your arm or hand becomes pale, cool, tingly, or numb. °  °This information is not intended to replace advice given to you by your health care provider. Make sure you discuss any questions you have with your health care provider. °  °Document Released: 09/29/2010 Document Revised: 09/17/2014 Document Reviewed: 03/15/2014 °Elsevier Interactive Patient Education ©2016 [employer] Inc. ° °

## 2016-07-05 NOTE — Progress Notes (Signed)
Pt complained of being dizzy. Pt diaphoretic and pale.  Lips white.  BP 86/60.  Pt head down/feet up. Color improving. BP improving, Pt states he feels much better. See VS. Dr. [name] notified. IVF rate increase to 150cc hr.

## 2016-07-06 ENCOUNTER — Encounter (HOSPITAL_COMMUNITY): Payer: Self-pay | Admitting: Cardiology

## 2016-07-06 ENCOUNTER — Encounter (HOSPITAL_COMMUNITY): Payer: BLUE CROSS/BLUE SHIELD

## 2016-07-09 ENCOUNTER — Telehealth (HOSPITAL_COMMUNITY): Payer: Self-pay | Admitting: *Deleted

## 2016-07-09 NOTE — Telephone Encounter (Signed)
Pre cert with [employer] pending for [facility].  Requires peer to peer review with Dr.McLean [phone].  Provider must call to discuss exam.  Member ID #[id number]  Message routed to Dr.McLean

## 2016-07-11 ENCOUNTER — Encounter (HOSPITAL_COMMUNITY): Payer: Self-pay

## 2016-07-11 ENCOUNTER — Ambulatory Visit (HOSPITAL_COMMUNITY)
Admit: 2016-07-11 | Discharge: 2016-07-11 | Disposition: A | Discharge status: Home / Self Care | Payer: BLUE CROSS/BLUE SHIELD | Attending: Cardiology | Admitting: Cardiology

## 2016-07-11 VITALS — BP 138/92 | HR 76 | Wt 186.2 lb

## 2016-07-11 DIAGNOSIS — K4020 Bilateral inguinal hernia, without obstruction or gangrene, not specified as recurrent: Secondary | ICD-10-CM | POA: Diagnosis not present

## 2016-07-11 DIAGNOSIS — Z87891 Personal history of nicotine dependence: Secondary | ICD-10-CM | POA: Insufficient documentation

## 2016-07-11 DIAGNOSIS — I110 Hypertensive heart disease with heart failure: Secondary | ICD-10-CM | POA: Diagnosis not present

## 2016-07-11 DIAGNOSIS — I10 Essential (primary) hypertension: Secondary | ICD-10-CM

## 2016-07-11 DIAGNOSIS — I5022 Chronic systolic (congestive) heart failure: Secondary | ICD-10-CM | POA: Insufficient documentation

## 2016-07-11 DIAGNOSIS — I429 Cardiomyopathy, unspecified: Secondary | ICD-10-CM | POA: Insufficient documentation

## 2016-07-11 DIAGNOSIS — I493 Ventricular premature depolarization: Secondary | ICD-10-CM | POA: Diagnosis not present

## 2016-07-11 DIAGNOSIS — N529 Male erectile dysfunction, unspecified: Secondary | ICD-10-CM | POA: Insufficient documentation

## 2016-07-11 LAB — BASIC METABOLIC PANEL
Anion gap: 8 (ref 5–15)
BUN: 21 mg/dL — AB (ref 6–20)
CALCIUM: 9.2 mg/dL (ref 8.9–10.3)
CO2: 26 mmol/L (ref 22–32)
CREATININE: 1.35 mg/dL — AB (ref 0.61–1.24)
Chloride: 103 mmol/L (ref 101–111)
GFR calc Af Amer: >60 mL/min (ref >60)
GFR, EST NON AFRICAN AMERICAN: 54 mL/min — AB (ref >60)
GLUCOSE: 109 mg/dL — AB (ref 65–99)
Potassium: 4.5 mmol/L (ref 3.5–5.1)
Sodium: 137 mmol/L (ref 135–145)

## 2016-07-11 LAB — BRAIN NATRIURETIC PEPTIDE: B Natriuretic Peptide: 446.7 pg/mL — ABNORMAL HIGH (ref 0.0–100.0)

## 2016-07-11 LAB — DIGOXIN LEVEL: Digoxin Level: 0.3 ng/mL — ABNORMAL LOW (ref 0.8–2.0)

## 2016-07-11 MED ORDER — FUROSEMIDE 20 MG PO TABS: 20.0000 mg | ORAL_TABLET | Freq: Every day | ORAL | 3 refills | Status: DC

## 2016-07-11 MED ORDER — DIGOXIN 125 MCG PO TABS: 0.1250 mg | ORAL_TABLET | Freq: Every day | ORAL | 3 refills | Status: DC

## 2016-07-11 MED ORDER — SPIRONOLACTONE 25 MG PO TABS: 12.5000 mg | ORAL_TABLET | Freq: Every day | ORAL | 3 refills | Status: DC

## 2016-07-11 MED ORDER — CARVEDILOL 3.125 MG PO TABS: 3.1250 mg | ORAL_TABLET | Freq: Two times a day (BID) | ORAL | 3 refills | Status: DC

## 2016-07-11 MED ORDER — POTASSIUM CHLORIDE CRYS ER 10 MEQ PO TBCR: 10.0000 meq | EXTENDED_RELEASE_TABLET | Freq: Every day | ORAL | 3 refills | Status: DC

## 2016-07-11 NOTE — Patient Instructions (Signed)
TAKE Carvedilol 3.125mg  tablet twice daily.  TAKE Spironolactone 12.5 mg (1/2 tablet) daily.  DECREASE Lasix to 20mg  daily.  DECREASE Potassium to daily.  Routine lab work today. Will notify you of abnormal results  Repeat Labs (bmet) in 10 days.  Follow up with Dr.McLean in 1 month.

## 2016-07-11 NOTE — Telephone Encounter (Signed)
Pre cert approved by peer to peer review. Approval #[id number].  Message sent to [facility] to schedule appt.

## 2016-07-12 LAB — PROTEIN ELECTROPHORESIS, SERUM
A/G RATIO SPE: 1.4 (ref 0.7–1.7)
ALBUMIN ELP: 3.7 g/dL (ref 2.9–4.4)
ALPHA-1-GLOBULIN: 0.3 g/dL (ref 0.0–0.4)
ALPHA-2-GLOBULIN: 0.6 g/dL (ref 0.4–1.0)
Beta Globulin: 1.2 g/dL (ref 0.7–1.3)
GLOBULIN, TOTAL: 2.6 g/dL (ref 2.2–3.9)
Gamma Globulin: 0.5 g/dL (ref 0.4–1.8)
TOTAL PROTEIN ELP: 6.3 g/dL (ref 6.0–8.5)

## 2016-07-12 NOTE — Progress Notes (Signed)
PCP: Dr. Neva SeatGreene Cardiology: Dr Shirlee LatchMcLean  65 yo with history of HTN and nonischemic cardiomyopathy presents for cardiology followup.  Patient has no prior cardiac history.  He used to be an Museum/gallery curatorultra-marathoner until about 3 years ago, he stopped due to symptomatic inguinal hernias.  In 3/17, he had bilateral inguinal hernia repairs. Around 6/17, he began to develop exertional dyspnea.  There was no inciting event.  This became gradually progressive.  He had a normal appetite but has early satiety.  [place] took a [place] to [place] in 9[place]17 and had a really hard time walking around and keeping up.  He noted dyspnea walking up stairs and hills.  No chest pain or pressure.  He has frequent PVCs by ECGs but does not feel palpitations.  No lightheadedness or syncope.   I had him get an echocardiogram.  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  He then had LHC/RHC done, showing decreased cardiac output and no significant CAD.   With medication adjustment, he is feeling much better. No further dyspnea.  No lightheadedness.    Labs (10/17): K 4.1, creatinine 1.38 => 1.33, hgb 14.4, BNP 927  PMH: 1. H/o inguinal hernia repair 3/17.  2. HTN 3. Erectile dysfunction 4. Chronic systolic CHF: Nonischemic cardiomyopathy.  - Echo (10/17): EF 20-25%, increased apical trabeculations possibly consistent with noncompaction, diffuse hypokinesis, mildly dilated RV, moderate to severe MR.  - RHC/LHC (10/17): 20% distal LM, mean RA 1, PA 40/18, mean PCWP 18, CI 1.9, PVR 2.5 WU.  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  Quit smoking 23 years ago.  Ultra-marathoner until 3 years ago.   FH: No heart problems that he knows of.   ROS: All systems reviewed and negative except as per HPI.   Current Outpatient Prescriptions  Medication Sig Dispense Refill  . aspirin EC 81 MG tablet Take 1 tablet (81 mg total) by mouth daily.  90 tablet 3  . furosemide (LASIX) 20 MG tablet Take 1 tablet (20 mg total) by mouth daily. 30 tablet 3  . potassium chloride SA (K-DUR,KLOR-CON) 10 MEQ tablet Take 1 tablet (10 mEq total) by mouth daily. 30 tablet 3  . sacubitril-valsartan (ENTRESTO) 24-26 MG Take 1 tablet by mouth 2 (two) times daily. 60 tablet 3  . acetaminophen (TYLENOL) 500 MG tablet Take 500 mg by mouth every 6 (six) hours as needed (for pain. (SELDOM)).    . carvedilol (COREG) 3.125 MG tablet Take 1 tablet (3.125 mg total) by mouth 2 (two) times daily with a meal. 60 tablet 3  . digoxin (LANOXIN) 0.125 MG tablet Take 1 tablet (0.125 mg total) by mouth daily. 30 tablet 3  . spironolactone (ALDACTONE) 25 MG tablet Take 0.5 tablets (12.5 mg total) by mouth daily. 15 tablet 3   No current facility-administered medications for this encounter.    BP (!) 138/92   Pulse 76   Wt 186 lb 4 oz (84.5 kg)   SpO2 100%   BMI 25.26 kg/m  General: NAD Neck: JVP 7 cm, no thyromegaly or thyroid nodule.  Lungs: Clear to auscultation bilaterally with normal respiratory effort. CV: Nondisplaced PMI.  Heart regular S1S2, 1/6 HSM at apex.  No edema.  No carotid bruit.  Normal pedal pulses.  Abdomen: Soft, nontender, no hepatosplenomegaly, no distention.  Skin: Intact without lesions or rashes.  Neurologic: Alert and oriented x 3.  Psych: Normal affect. Extremities: No clubbing or cyanosis.  HEENT: Normal.   Assessment/Plan: 1. Chronic systolic CHF: Nonischemic cardiomyopathy. 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  Etiology of his CHF is not clear => no definite inciting event.  He has a history of HTN but doubt this was the only trigger.  Echo is somewhat suggestive of noncompaction.  This will need to be confirmed by cMRI.  Need to quantify PVCs. No family history of CMP.  Cannot rule out viral myocarditis. - Need 48 hour holter, SPEP, and cardiac MRI to work up  cardiomyopathy.  - Can decrease Lasix to 20 mg daily with KCl 10 daily.  - Continue Entresto 24/26 bid.  - Continue digoxin 0.125 mg daily.  Check level.  - Needs to take Coreg 3.125 mg bid rather than once daily.  - Add spironolactone 12.5 daily with BMET 10 days.   - BMET/BNP today.  - Repeat echo in 6 months (4/17) to look for recovery. QRS not wide enough for CRT benefit.  2. PVCs: Frequent PVCs.  He does not feel them.  It is possible that he has a PVC-mediated CMP, but PVCs also could just be a product of the cardiomyopathy itself. He will need 48 hour holter to quantify PVCs.   Followup in 1 month.   Marca Ancona 07/12/2016

## 2016-07-12 NOTE — Addendum Note (Signed)
Encounter addended by: Laurey Morale, MD on: 07/12/2016 12:28 AM<BR>    Actions taken: Sign clinical note, Visit diagnoses modified, LOS modified

## 2016-07-23 ENCOUNTER — Ambulatory Visit (HOSPITAL_COMMUNITY)
Admission: RE | Admit: 2016-07-23 | Discharge: 2016-07-23 | Disposition: A | Discharge status: Home / Self Care | Payer: BLUE CROSS/BLUE SHIELD | Source: Ambulatory Visit | Attending: Cardiology | Admitting: Cardiology

## 2016-07-23 DIAGNOSIS — I10 Essential (primary) hypertension: Secondary | ICD-10-CM | POA: Insufficient documentation

## 2016-07-23 LAB — BASIC METABOLIC PANEL
Anion gap: 6 (ref 5–15)
BUN: 17 mg/dL (ref 6–20)
CHLORIDE: 108 mmol/L (ref 101–111)
CO2: 24 mmol/L (ref 22–32)
CREATININE: 1.22 mg/dL (ref 0.61–1.24)
Calcium: 8.9 mg/dL (ref 8.9–10.3)
Glucose, Bld: 97 mg/dL (ref 65–99)
POTASSIUM: 4.2 mmol/L (ref 3.5–5.1)
SODIUM: 138 mmol/L (ref 135–145)

## 2016-07-30 ENCOUNTER — Ambulatory Visit (HOSPITAL_COMMUNITY): Admission: RE | Admit: 2016-07-30 | Payer: BLUE CROSS/BLUE SHIELD | Source: Ambulatory Visit

## 2016-08-20 ENCOUNTER — Telehealth (HOSPITAL_COMMUNITY): Payer: Self-pay | Admitting: Pharmacist

## 2016-08-20 ENCOUNTER — Encounter (HOSPITAL_COMMUNITY): Payer: Self-pay

## 2016-08-20 ENCOUNTER — Ambulatory Visit (HOSPITAL_COMMUNITY)
Admission: RE | Admit: 2016-08-20 | Discharge: 2016-08-20 | Disposition: A | Discharge status: Home / Self Care | Payer: BLUE CROSS/BLUE SHIELD | Source: Ambulatory Visit | Attending: Cardiology | Admitting: Cardiology

## 2016-08-20 VITALS — BP 166/100 | HR 62 | Wt 194.5 lb

## 2016-08-20 DIAGNOSIS — I429 Cardiomyopathy, unspecified: Secondary | ICD-10-CM | POA: Insufficient documentation

## 2016-08-20 DIAGNOSIS — I10 Essential (primary) hypertension: Secondary | ICD-10-CM

## 2016-08-20 DIAGNOSIS — I5022 Chronic systolic (congestive) heart failure: Secondary | ICD-10-CM | POA: Diagnosis not present

## 2016-08-20 DIAGNOSIS — Z7982 Long term (current) use of aspirin: Secondary | ICD-10-CM | POA: Insufficient documentation

## 2016-08-20 DIAGNOSIS — I493 Ventricular premature depolarization: Secondary | ICD-10-CM | POA: Diagnosis not present

## 2016-08-20 DIAGNOSIS — I110 Hypertensive heart disease with heart failure: Secondary | ICD-10-CM | POA: Insufficient documentation

## 2016-08-20 DIAGNOSIS — N529 Male erectile dysfunction, unspecified: Secondary | ICD-10-CM | POA: Insufficient documentation

## 2016-08-20 LAB — BASIC METABOLIC PANEL
Anion gap: 6 (ref 5–15)
BUN: 23 mg/dL — AB (ref 6–20)
CALCIUM: 9.4 mg/dL (ref 8.9–10.3)
CHLORIDE: 103 mmol/L (ref 101–111)
CO2: 28 mmol/L (ref 22–32)
CREATININE: 1.19 mg/dL (ref 0.61–1.24)
GFR calc non Af Amer: >60 mL/min (ref >60)
Glucose, Bld: 98 mg/dL (ref 65–99)
Potassium: 4.5 mmol/L (ref 3.5–5.1)
SODIUM: 137 mmol/L (ref 135–145)

## 2016-08-20 LAB — BRAIN NATRIURETIC PEPTIDE: B NATRIURETIC PEPTIDE 5: 310.7 pg/mL — AB (ref 0.0–100.0)

## 2016-08-20 MED ORDER — CARVEDILOL 6.25 MG PO TABS: 6.2500 mg | ORAL_TABLET | Freq: Two times a day (BID) | ORAL | 6 refills | Status: DC

## 2016-08-20 MED ORDER — SPIRONOLACTONE 25 MG PO TABS: 25.0000 mg | ORAL_TABLET | Freq: Every day | ORAL | 3 refills | Status: DC

## 2016-08-20 NOTE — Patient Instructions (Signed)
INCREASE Carvedilol (Coreg) to 6.25 mg tablet twice daily. Can "double up" on your current 3.125 mg tablets: Take 2 tablets twice daily. New Rx has been sent to your pharmacy electronically for 6.25 mg tablets: Take 1 tablet twice daily.  INCREASE Spironolactone 25 mg tablet: Take 1 whole tablet once daily.  Routine lab work today. Will notify you of abnormal results, otherwise no news is good news!  Follow up with Dr. [name] in 6 weeks.  Do the following things EVERYDAY: 1) Weigh yourself in the morning before breakfast. Write it down and keep it in a log. 2) Take your medicines as prescribed 3) Eat low salt foods-Limit salt (sodium) to 2000 mg per day.  4) Stay as active as you can everyday 5) Limit all fluids for the day to less than 2 liters

## 2016-08-20 NOTE — Telephone Encounter (Signed)
Entresto 24-26 mg BID PA approved by [employer].   Rigel Filsinger K. Bonnye Fava, PharmD, BCPS, CPP Clinical Pharmacist Pager: [phone] Phone: [phone] 08/20/2016 12:06 PM

## 2016-08-20 NOTE — Progress Notes (Signed)
PCP: Dr. Neva Seat Cardiology: Dr [name]  65 yo with history of HTN and nonischemic cardiomyopathy presents for cardiology followup.  Patient has no prior cardiac history.  He used to be an Museum/gallery curator until about 3 years ago, he stopped due to symptomatic inguinal hernias.  In 3/17, he had bilateral inguinal hernia repairs. Around 6/17, he began to develop exertional dyspnea.  There was no inciting event.  This became gradually progressive.  He took a trip to [place] in 9/17 and had a really hard time walking around and keeping up.  He noted dyspnea walking up stairs and hills.  No chest pain or pressure.  He had frequent PVCs by ECGs but does not feel palpitations.  No lightheadedness or syncope.   I had him get an echocardiogram.  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  He then had LHC/RHC done, showing decreased cardiac output and no significant CAD.   With medication adjustment, he is feeling much better. No further dyspnea.  No lightheadedness.  [place]  BP still running high.  Says it is usually around 140/90 when he checks at home.  He says he feels better with a "higher blood pressure."  He is going to be going out to [facility] for a month or so to visit grandchildren.   Labs (10/17): K 4.1, creatinine 1.38 => 1.33, hgb 14.4, BNP 927 Labs (11/17): K 4.2, creatinine 1.22, BNP 447, digoxin 0.3, SPEP negative.   PMH: 1. H/o inguinal hernia repair 3/17.  2. HTN 3. Erectile dysfunction 4. Chronic systolic CHF: Nonischemic cardiomyopathy.  - Echo (10/17): EF 20-25%, increased apical trabeculations possibly consistent with noncompaction, diffuse hypokinesis, mildly dilated RV, moderate to severe MR.  - RHC/LHC (10/17): 20% distal LM, mean RA 1, PA 40/18, mean PCWP 18, CI 1.9, PVR 2.5 WU. 5. PVCs: Frequent.   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  Quit smoking 23 years ago.  Ultra-marathoner until 3 years ago.   FH: No heart problems that he knows of.   ROS: All systems reviewed and negative except as per HPI.   Current Outpatient Prescriptions  Medication Sig Dispense Refill  . acetaminophen (TYLENOL) 500 MG tablet Take 500 mg by mouth every 6 (six) hours as needed (for pain. (SELDOM)).    Marland Kitchen aspirin EC 81 MG tablet Take 1 tablet (81 mg total) by mouth daily. 90 tablet 3  . carvedilol (COREG) 6.25 MG tablet Take 1 tablet (6.25 mg total) by mouth 2 (two) times daily with a meal. 60 tablet 6  . digoxin (LANOXIN) 0.125 MG tablet Take 1 tablet (0.125 mg total) by mouth daily. 30 tablet 3  . furosemide (LASIX) 20 MG tablet Take 1 tablet (20 mg total) by mouth daily. 30 tablet 3  . potassium chloride SA (K-DUR,KLOR-CON) 10 MEQ tablet Take 1 tablet (10 mEq total) by mouth daily. 30 tablet 3  . sacubitril-valsartan (ENTRESTO) 24-26 MG Take 1 tablet by mouth 2 (two) times daily. 60 tablet 3  . spironolactone (ALDACTONE) 25 MG tablet Take 1 tablet (25 mg total) by mouth daily. 30 tablet 3   No current facility-administered medications for this encounter.    BP (!) 166/100   Pulse 62   Wt 194 lb 8 oz (88.2 kg)   SpO2 100%   BMI 26.38 kg/m  General: NAD Neck: JVP 7 cm, no thyromegaly  or thyroid nodule.  Lungs: Clear to auscultation bilaterally with normal respiratory effort. CV: Nondisplaced PMI.  Heart regular S1S2, 1/6 HSM at apex.  No edema.  No carotid bruit.  Normal pedal pulses.  Abdomen: Soft, nontender, no hepatosplenomegaly, no distention.  Skin: Intact without lesions or rashes.  Neurologic: Alert and oriented x 3.  Psych: Normal affect. Extremities: No clubbing or cyanosis.  HEENT: Normal.   Assessment/Plan: 1. Chronic systolic CHF: Nonischemic cardiomyopathy. 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  Etiology of his CHF is not clear =>  no definite inciting event.  He has a history of HTN but doubt this was the only trigger.  Echo is somewhat suggestive of noncompaction.  This will need to be confirmed by cMRI.  Need to quantify PVCs. No family history of CMP.  Cannot rule out viral myocarditis. SPEP negative.  Currently, NYHA class II symptoms and not volume overloaded.  Unfortunately, now that he feels better he is more reluctant to titrate his cardiac medications or do further testing.  - I want him to have 48 hour holter to quantify PVCs but he does not want to do this before his trip to [place].  - I would like him to get a cardiac MRI to assess for infiltrative disease.  Will try to arrange this after he gets back from [place].  - Continue current Lasix and KCl.  BMET/BNP today.   - Continue Entresto 24/26 bid.  - Continue digoxin 0.125 mg daily, recent level ok.   - Increase Coreg to 6.25 mg bid.   - Increase spironolactone to 25 mg daily.    - Repeat echo in 6 months (4/17) to look for recovery. QRS not wide enough for CRT benefit.  2. PVCs: Frequent PVCs.  He does not feel them.  It is possible that he has a PVC-mediated CMP, but PVCs also could just be a product of the cardiomyopathy itself. He will need 48 hour holter to quantify PVC, he does not want to wear at this time.  3. HTN: BP high.  Tried to convince him of the risk of running high blood pressure.  He feels better when BP > 140/90 and reluctant to lower.  He did agree to a mild increase in Coreg and increase in spironolactone.   Followup in 6 wks.   Marca AnconaDalton Davaris Youtsey 08/20/2016

## 2016-08-22 ENCOUNTER — Telehealth: Payer: Self-pay

## 2016-08-22 NOTE — Telephone Encounter (Signed)
Prior [place] for [employer] 24-26 obtained from [place]. Local pharmacy notified.

## 2016-08-30 ENCOUNTER — Telehealth: Payer: Self-pay | Admitting: Physician Assistant

## 2016-08-30 ENCOUNTER — Telehealth (HOSPITAL_COMMUNITY): Payer: Self-pay | Admitting: *Deleted

## 2016-08-30 NOTE — Telephone Encounter (Signed)
Patient called to see if he should continue his cardiac medications because he has a fever of 101.0. Nurse advised patient that he needed to continue all of his medications as prescribed unless a provider or nurse tells him to stop them. Patient advised that he needed to contact his PCP about his fever. Patient verbalized understanding of plan.

## 2016-08-30 NOTE — Telephone Encounter (Signed)
Patient called in saying he is running a temp of 101.  I instructed to him to call him PCP regarding temp.  No further questions at this time.

## 2016-08-30 NOTE — Telephone Encounter (Signed)
Pt has question about his medication asking to speak with a nurse

## 2016-10-01 ENCOUNTER — Encounter (HOSPITAL_COMMUNITY): Payer: Self-pay

## 2016-10-01 ENCOUNTER — Ambulatory Visit (HOSPITAL_COMMUNITY)
Admission: RE | Admit: 2016-10-01 | Discharge: 2016-10-01 | Disposition: A | Discharge status: Home / Self Care | Payer: BLUE CROSS/BLUE SHIELD | Source: Ambulatory Visit | Attending: Cardiology | Admitting: Cardiology

## 2016-10-01 VITALS — BP 150/88 | HR 74 | Wt 197.5 lb

## 2016-10-01 DIAGNOSIS — I5022 Chronic systolic (congestive) heart failure: Secondary | ICD-10-CM | POA: Diagnosis not present

## 2016-10-01 DIAGNOSIS — I110 Hypertensive heart disease with heart failure: Secondary | ICD-10-CM | POA: Insufficient documentation

## 2016-10-01 DIAGNOSIS — I255 Ischemic cardiomyopathy: Secondary | ICD-10-CM | POA: Diagnosis present

## 2016-10-01 DIAGNOSIS — I493 Ventricular premature depolarization: Secondary | ICD-10-CM

## 2016-10-01 DIAGNOSIS — I429 Cardiomyopathy, unspecified: Secondary | ICD-10-CM | POA: Diagnosis present

## 2016-10-01 NOTE — Patient Instructions (Signed)
Follow up in March with Echo

## 2016-10-01 NOTE — Progress Notes (Signed)
PCP: Dr. Neva Seat Cardiology: Dr [name]  66 yo with history of HTN and nonischemic cardiomyopathy presents for cardiology followup.  Patient has no prior cardiac history.  He used to be an Museum/gallery curator until about 2014, he stopped due to symptomatic inguinal hernias.  In 3/17, he had bilateral inguinal hernia repairs. Around 6/17, he began to develop exertional dyspnea.  There was no inciting event.  This became gradually progressive.  He took a trip to [place] in 9/17 and had a really hard time walking around and keeping up.  He noted dyspnea walking up stairs and hills.  No chest pain or pressure.  He had frequent PVCs by ECGs but does not feel palpitations.  No lightheadedness or syncope.   I had him get an echocardiogram.  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  He then had LHC/RHC done, showing decreased cardiac output and no significant CAD.   He was started on cardiac meds and Lasix.  He felt much better with medical treatment.  He recently took a trip to [place] to visit his grandchildren.  While he was there, he had the flu.  He had an episode of lightheadedness/presyncope that he attributed to his BP-active meds. He also felt like the medications were making him feel anxious.  Therefore, about 1 month ago, he stopped all his medications except for aspirin 81 daily and he continues to take Lasix 20 mg every other day.  He is back to jogging 3-10 miles daily, jogs relatively slowly but denies any exertional dyspnea. No chest pain, no orthopnea/PND.  No lightheadedness since stopping all his meds.  No palpitations.  Weight has been stable.   Labs (10/17): K 4.1, creatinine 1.38 => 1.33, hgb 14.4, BNP 927 Labs (11/17): K 4.2, creatinine 1.22, BNP 447, digoxin 0.3, SPEP negative.  Labs (12/17): K 4.5, creatinine 1.19, BNP 311  PMH: 1. H/o inguinal hernia repair 3/17.  2. HTN 3. Erectile dysfunction 4. Chronic systolic CHF: Nonischemic cardiomyopathy.  - Echo  (10/17): EF 20-25%, increased apical trabeculations possibly consistent with noncompaction, diffuse hypokinesis, mildly dilated RV, moderate to severe MR.  - RHC/LHC (10/17): 20% distal LM, mean RA 1, PA 40/18, mean PCWP 18, CI 1.9, PVR 2.5 WU. 5. PVCs: Frequent.   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  Quit smoking 23 years ago.  Ultra-marathoner until 3 years ago.   FH: No heart problems that he knows of.   ROS: All systems reviewed and negative except as per HPI.   Current Outpatient Prescriptions  Medication Sig Dispense Refill  . acetaminophen (TYLENOL) 500 MG tablet Take 500 mg by mouth every 6 (six) hours as needed (for pain. (SELDOM)).    Marland Kitchen aspirin EC 81 MG tablet Take 1 tablet (81 mg total) by mouth daily. 90 tablet 3  . carvedilol (COREG) 6.25 MG tablet Take 1 tablet (6.25 mg total) by mouth 2 (two) times daily with a meal. 60 tablet 6  . digoxin (LANOXIN) 0.125 MG tablet Take 1 tablet (0.125 mg total) by mouth daily. 30 tablet 3  . furosemide (LASIX) 20 MG tablet Take 1 tablet (20 mg total) by mouth daily. 30 tablet 3  . potassium chloride SA (K-DUR,KLOR-CON) 10 MEQ tablet Take 1 tablet (10 mEq total) by mouth daily. 30 tablet 3  . sacubitril-valsartan (ENTRESTO) 24-26 MG Take 1 tablet by mouth 2 (two) times daily. 60 tablet 3  . spironolactone (ALDACTONE) 25 MG  tablet Take 1 tablet (25 mg total) by mouth daily. 30 tablet 3   No current facility-administered medications for this encounter.    BP (!) 150/88   Pulse 74   Wt 197 lb 8 oz (89.6 kg)   SpO2 100%   BMI 26.79 kg/m  General: NAD Neck: JVP 7 cm, no thyromegaly or thyroid nodule.  Lungs: Clear to auscultation bilaterally with normal respiratory effort. CV: Nondisplaced PMI.  Heart regular S1S2, no murmur.  No edema.  No carotid bruit.  Normal pedal pulses.  Abdomen: Soft, nontender, no hepatosplenomegaly, no distention.  Skin: Intact  without lesions or rashes.  Neurologic: Alert and oriented x 3.  Psych: Normal affect. Extremities: No clubbing or cyanosis.  HEENT: Normal.   Assessment/Plan: 1. Chronic systolic CHF: Nonischemic cardiomyopathy. 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  Etiology of his CHF is not clear => no definite inciting event.  He has a history of HTN but doubt this was the only trigger.  Echo is somewhat suggestive of noncompaction.  This would ideally be confirmed by cMRI, but he does not want an MRI (concerned about side effects).  Would ideally quantify his PVCs to see how likely it is that these are contributing to his cardiomyopathy, but he does not want to wear a holter. No family history of CMP.  Cannot rule out viral myocarditis. SPEP negative.  Currently, NYHA class I-II symptoms and not volume overloaded.  He is back to jogging on most days and feels good. Unfortunately, he has stopped all his cardiac meds except ASA 81 and Lasix 20 mg every other day. He looks euvolemic.  - Continue Lasix 20 mg every other day.  - We had a long discussion about his cardiac meds today, he is very resistant to taking medications.  His baseline BP is high.  We discussed that though he may be well-compensated today, if his EF remains low and he does not take evidence-based medications, his chances of death and CHF decompensation are considerably higher.  He has agreed to a repeat echo, if this shows persistently low EF, I will recommend again that he restart his cardiac meds and he says that he would be more inclined to do so if he sees a persistently low EF.  - I want him to have 48 hour holter to quantify PVCs ideally but he wants to hold off.  - I recommended a cardiac MRI to assess for infiltrative disease/myocarditis.  He is leery about getting an MRI done so will do an echo again as above. - QRS not wide enough for CRT benefit.  2. PVCs:  Frequent PVCs.  He does not feel them.  It is possible that he has a PVC-mediated CMP, but PVCs also could just be a product of the cardiomyopathy itself. He will need 48 hour holter to quantify PVCs, he does not want to wear at this time.  3. HTN: BP high.  Tried to convince him of the risk of running high blood pressure.  He says that he feels better when BP > 140/90 and is reluctant to lower it.  As above, I think he will be more amenable to restarting cardiac meds if we demonstrate by echo that EF remains depressed.   He will followup in early March with echo (after he returns from another trip to [place]).   Marca Ancona 10/01/2016

## 2016-11-26 ENCOUNTER — Telehealth (HOSPITAL_COMMUNITY): Payer: Self-pay

## 2016-11-26 NOTE — Telephone Encounter (Signed)
Patient called CHF clinic triage to cancel his apt with our office tomorrow afternoon as he is still out of town. Was due for echo and apt with Dr. [name]. Will cancel apt and advised patient to call to reschedule once he is back in town. Made patient aware of cancellation policy and advised that Dr. [name] is booked and out of town next month.  Patient has no complaints at this time and feels well.  Ave Filter, RN

## 2016-11-27 ENCOUNTER — Ambulatory Visit (HOSPITAL_COMMUNITY): Payer: BLUE CROSS/BLUE SHIELD

## 2016-11-27 ENCOUNTER — Inpatient Hospital Stay (HOSPITAL_COMMUNITY): Admission: RE | Admit: 2016-11-27 | Payer: BLUE CROSS/BLUE SHIELD | Source: Ambulatory Visit

## 2016-12-03 ENCOUNTER — Encounter: Payer: Self-pay | Admitting: Family Medicine

## 2016-12-03 ENCOUNTER — Ambulatory Visit (INDEPENDENT_AMBULATORY_CARE_PROVIDER_SITE_OTHER): Payer: BLUE CROSS/BLUE SHIELD | Admitting: Family Medicine

## 2016-12-03 VITALS — BP 142/94 | HR 98 | Temp 98.5°F | Resp 16 | Ht 69.5 in | Wt 207.6 lb

## 2016-12-03 DIAGNOSIS — R109 Unspecified abdominal pain: Secondary | ICD-10-CM | POA: Diagnosis not present

## 2016-12-03 DIAGNOSIS — Z8711 Personal history of peptic ulcer disease: Secondary | ICD-10-CM | POA: Diagnosis not present

## 2016-12-03 DIAGNOSIS — R000 Tachycardia, unspecified: Secondary | ICD-10-CM

## 2016-12-03 DIAGNOSIS — Z9119 Patient's noncompliance with other medical treatment and regimen: Secondary | ICD-10-CM | POA: Diagnosis not present

## 2016-12-03 DIAGNOSIS — I499 Cardiac arrhythmia, unspecified: Secondary | ICD-10-CM

## 2016-12-03 DIAGNOSIS — Z91199 Patient's noncompliance with other medical treatment and regimen due to unspecified reason: Secondary | ICD-10-CM

## 2016-12-03 DIAGNOSIS — R5383 Other fatigue: Secondary | ICD-10-CM | POA: Diagnosis not present

## 2016-12-03 LAB — CBC WITH DIFFERENTIAL/PLATELET
BASOS ABS: 0.1 10*3/uL (ref 0.0–0.2)
Basos: 1 %
EOS (ABSOLUTE): 0.2 10*3/uL (ref 0.0–0.4)
Eos: 3 %
HEMATOCRIT: 41.2 % (ref 37.5–51.0)
HEMOGLOBIN: 14.5 g/dL (ref 13.0–17.7)
LYMPHS: 15 %
Lymphocytes Absolute: 1.1 10*3/uL (ref 0.7–3.1)
MCH: 33.2 pg — ABNORMAL HIGH (ref 26.6–33.0)
MCHC: 35.2 g/dL (ref 31.5–35.7)
MCV: 94 fL (ref 79–97)
MONOCYTES: 7 %
Monocytes Absolute: 0.5 10*3/uL (ref 0.1–0.9)
NEUTROS ABS: 5.3 10*3/uL (ref 1.4–7.0)
NEUTROS PCT: 74 %
Platelets: 298 10*3/uL (ref 150–379)
RBC: 4.37 x10E6/uL (ref 4.14–5.80)
RDW: 12.7 % (ref 12.3–15.4)
WBC: 7.2 10*3/uL (ref 3.4–10.8)

## 2016-12-03 LAB — IFOBT (OCCULT BLOOD): IFOBT: NEGATIVE

## 2016-12-03 NOTE — Progress Notes (Signed)
By signing my name below, I, Mesha Guinyard, attest that this documentation has been prepared under the direction and in the presence of Meredith Staggers, MD.  Electronically Signed: Arvilla Market, Medical Scribe. 12/03/16. 12:48 PM.  Subjective:    Patient ID: Bryan Harmon, male    DOB: 1951-02-24, 66 y.o.   MRN: [id number]  HPI Chief Complaint  Patient presents with  . Stomach pain    x 2 wks, "pressure in stomach"    HPI Comments: Bryan Harmon is a 66 y.o. male with a PMHx of  HTN, appendectomy as a child and inguinal hernia repairs who presents to the Primary Care at [facility] and [facility] complaining of abdominal pain onset 1 month. He has also been evaluated by Dr. [name] with cardiology in Nov, Dec 2017 Jan 2018 for exertional dyspnea. Echo EF 20-25% with moderate to severe mitral regurg. He had a heart catheterization with decreased carsdiac output, without significant CAD, chronic systolic CHF - nonischemic. 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 He was recommended a cardiac MRI and holter monitor Jan 22nd, 2018 but that was declined. BP was 140/90 at that visit. Per cardiology reluctant to start medications at that Jan visit.  Describes pain as a radiating dull burning sensation (heat sensation inside that radiates from this lower abdominal to his chest) with pressure and associated sxs of weakness/fatigue (onset 1 month), fatigue that feels like heaviness in his shoulders and when he climbs the stairs "it goes to his head".  He has also had possible dark stool a week ago, but not recently. Pt had a bleeding stomach ulcer 32 years ago that caused blood in his stool and he mentions his sxs feel similar. Pt took lasix a few time a couple of days ago without relief of his sxs. Pt discontinued ASA, and all of his heart medications for 3 months ago now. Pt started taking Vitmin B6 500 mg 3x for 1 day, 4 days ago.  Pt's last alcoholic drink was a week ago, and he used to drink 1-2 beers a day. Pt does not smoke. Denies SOB, chest pain, N/V, and palpitations.  Pt notes he is nervous now and that is reason for fast heartbeat.  Pt would like to get his EKG done at his cardiologist. He doesn't want to get sent to the ED since he's done that in the past and reportedly didn't do anything for the 4 hours he was there and left him exhausted.  Denies chest pains, but does later in visit state the discomfort in his upper stomach moves up his chest to shoulders, then head.   Wt Readings from Last 3 Encounters:  12/03/16 207 lb 9.6 oz (94.2 kg)  10/01/16 197 lb 8 oz (89.6 kg)  08/20/16 194 lb 8 oz (88.2 kg)    Patient Active Problem List   Diagnosis Date Noted  . Essential hypertension, benign 02/27/2012  . Bilateral inguinal hernia 01/25/2010  . 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 Hypertension   . Inguinal hernia, bilateral    Past Surgical History:  Procedure Laterality Date  . APPENDECTOMY  age 89  . CARDIAC CATHETERIZATION N/A 07/05/2016   Procedure: Right/Left Heart Cath and Coronary Angiography;  Surgeon: Laurey Morale, MD;  Location: [facility] INVASIVE CV LAB;  Service: Cardiovascular;  Laterality: N/A;  . DENTAL SURGERY    . INGUINAL HERNIA REPAIR Bilateral 12/07/2015  Procedure: LAPAROSCOPIC BILATERAL INGUINAL HERNIA REPAIR WITH MESH;  Surgeon: Luretha Murphy, MD;  Location: [facility] ORS;  Service: General;  Laterality: Bilateral;   No Known Allergies Prior to Admission medications   Medication Sig Start Date End Date Taking? Authorizing Provider  acetaminophen (TYLENOL) 500 MG tablet Take 500 mg by mouth every 6 (six) hours as needed (for pain. (SELDOM)).    Historical Provider, MD  aspirin EC 81 MG tablet Take 1 tablet (81 mg total) by mouth daily. 07/04/16   Laurey Morale, MD  carvedilol (COREG) 6.25 MG tablet Take 1 tablet (6.25 mg total) by mouth 2  (two) times daily with a meal. 08/20/16   Laurey Morale, MD  digoxin (LANOXIN) 0.125 MG tablet Take 1 tablet (0.125 mg total) by mouth daily. 07/11/16   Laurey Morale, MD  furosemide (LASIX) 20 MG tablet Take 1 tablet (20 mg total) by mouth daily. 07/11/16 10/09/16  Laurey Morale, MD  potassium chloride SA (K-DUR,KLOR-CON) 10 MEQ tablet Take 1 tablet (10 mEq total) by mouth daily. 07/11/16   Laurey Morale, MD  sacubitril-valsartan (ENTRESTO) 24-26 MG Take 1 tablet by mouth 2 (two) times daily. 07/04/16   Laurey Morale, MD  spironolactone (ALDACTONE) 25 MG tablet Take 1 tablet (25 mg total) by mouth daily. 08/20/16   Laurey Morale, MD   Social History   Social History  . Marital status: Married    Spouse name: N/A  . Number of children: 1  . Years of education: N/A   Occupational History  . physical chemist [employer]   Social History Main Topics  . Smoking status: Former Smoker    Quit date: 09/10/1996  . Smokeless tobacco: Never Used  . Alcohol use Yes     Comment: occasional   . Drug use: No  . Sexual activity: Not on file   Other Topics Concern  . Not on file   Social History Narrative   Married.  Son lives in [place]. Ultra-marathoner   Review of Systems  Constitutional: Positive for fatigue.  Respiratory: Negative for shortness of breath.   Cardiovascular: Negative for chest pain and palpitations.  Gastrointestinal: Positive for abdominal pain and blood in stool. Negative for nausea and vomiting.  Neurological: Positive for weakness.   Objective:  Physical Exam  Constitutional: He appears well-developed and well-nourished. No distress.  HENT:  Head: Normocephalic and atraumatic.  Eyes: Conjunctivae are normal.  Neck: Neck supple.  Cardiovascular: An irregular rhythm present. Tachycardia present.  Exam reveals no gallop and no friction rub.   No murmur heard. Pulmonary/Chest: Effort normal and breath sounds normal. No respiratory distress. He has no  wheezes. He has no rales.  Abdominal: There is tenderness (discomfort, but no upper abdominal pain) in the epigastric area.  Genitourinary:  Genitourinary Comments: Brown stool, nl tone  Musculoskeletal: He exhibits edema (pedal, 2+ extending to the mid tibia bilaterally).  Neurological: He is alert.  Skin: Skin is warm and dry.  Psychiatric: He has a normal mood and affect. His behavior is normal.  Nursing note and vitals reviewed.   During exam he appears to speak in shortened sentences but decline SOB and states it's from discussing his sxs  Vitals:   12/03/16 1151 12/03/16 1155 12/03/16 1235  BP: (!) 126/105 (!) 130/100 (!) 142/94  Pulse: 98    Resp: 16    Temp: 98.5 F (36.9 C)    TempSrc: Oral    SpO2: 97%    Weight: 207  lb 9.6 oz (94.2 kg)    Height: 5' 9.5" (1.765 m)     Body mass index is 30.22 kg/m.   Results for orders placed or performed in visit on 12/03/16  CBC with Differential/Platelet  Result Value Ref Range   WBC 7.2 3.4 - 10.8 x10E3/uL   RBC 4.37 4.14 - 5.80 x10E6/uL   Hemoglobin 14.5 13.0 - 17.7 g/dL   Hematocrit 16.1 09.6 - 51.0 %   MCV 94 79 - 97 fL   MCH 33.2 (H) 26.6 - 33.0 pg   MCHC 35.2 31.5 - 35.7 g/dL   RDW 04.5 40.9 - 81.1 %   Platelets 298 150 - 379 x10E3/uL   Neutrophils 74 Not Estab. %   Lymphs 15 Not Estab. %   Monocytes 7 Not Estab. %   Eos 3 Not Estab. %   Basos 1 Not Estab. %   Neutrophils Absolute 5.3 1.4 - 7.0 x10E3/uL   Lymphocytes Absolute 1.1 0.7 - 3.1 x10E3/uL   Monocytes Absolute 0.5 0.1 - 0.9 x10E3/uL   EOS (ABSOLUTE) 0.2 0.0 - 0.4 x10E3/uL   Basophils Absolute 0.1 0.0 - 0.2 x10E3/uL  IFOBT POC (occult bld, rslt in office)  Result Value Ref Range   IFOBT Negative     Assessment & Plan:  Bryan Harmon is a 66 y.o. male Abdominal pain, unspecified abdominal location - Plan: CBC with Differential/Platelet, IFOBT POC (occult bld, rslt in office), CANCELED: POCT CBC, CANCELED: CBC with  Differential/Platelet  Irregular heart beat - Plan: CANCELED: POCT CBC  Tachycardia - Plan: CANCELED: POCT CBC  Other fatigue - Plan: CANCELED: POCT CBC  History of peptic ulcer - Plan: IFOBT POC (occult bld, rslt in office)  History of nonadherence to medical treatment  1 month history of abdominal discomfort with associated proximal radiation and fatigue. Denies chest pain, denies dyspnea, but does appear to speak in shortened sentences during office visit.  O2 sat and blood pressure were ok, with elevated BP on a few readings.   - fast heart rate with irregular irregular rhythm - concerning for possible Afib, vs frequent PVC's. Movement of sensation through chest to shoulders -concerning for possible cardiac source. EKG, further workup was refused in office.   - abdominal exam with only minimal epigastric discomfort.   -fatigue, heart exam and perceived dyspnea concerning with his prior history of CHF and now off meds for 3 months. Risks of undiagnosed afib or other possible unstable arhythmia were discussed, including possible complications of potential CVA, ischemic colitis, syncope, death. understanding expressed. Patient signed AMA paperwork regarding these risks and expressed understanding of risks and potential bad outcomes. He would like to meet with cardiologist in 3 days.   8:41 PM -reassuring CBC and heme negative stool on stat labs.  Discussed with patient at home. Less likley peptic ulcer disease, but can start PPI (prilosec otc qd) for now. Recheck in next week if abdominal symptoms not improved.   -ER/911/RTC precautions discussed if any worsening symptoms or if he changes his mind about workup such as EKG. Understanding expressed.      No orders of the defined types were placed in this encounter.  Patient Instructions   I will check your blood count and will have that resolved later this afternoon. I will also check to see if there is blood in the stool, that can be seen  sometimes with ulcers. If blood count or blood in the stool is abnormal, would recommend further evaluation at that time. I will  call you with those results.  As we discussed, I am very concerned about your fast heart rate and irregular heart rate as well as fatigue as those symptoms may be form your heart, not stomach. I am not able to tell you about heart rhythms further without an EKG. If you are having a heart arrhythmia such as atrial fibrillation, that can cause your blood pressure to drop so low that you pass out or possible death. Additionally it can cause stroke from blood clots to the brain, and even possible lack of blood flow to other areas, including intestines. I would recommend EKG and other evaluation of fatigue and heart rate, so if you change your mind, please return here, emergency room, or any medical provider. Also please follow up with your cardiologist as soon as possible, as I am concerned your congestive heart failure may also be contributing to your symptoms, especially off the medications.  Return to the clinic, call 911, or go to the nearest emergency room if any of your symptoms worsen or new symptoms occur.   IF you received an x-ray today, you will receive an invoice from [facility] Radiology. Please contact [facility] Radiology at [phone] with questions or concerns regarding your invoice.   IF you received labwork today, you will receive an invoice from [facility]. Please contact [facility] at [phone] with questions or concerns regarding your invoice.   Our billing staff will not be able to assist you with questions regarding bills from these companies.  You will be contacted with the lab results as soon as they are available. The fastest way to get your results is to activate your My Chart account. Instructions are located on the last page of this paperwork. If you have not heard from [place] regarding the results in 2 weeks, please contact this office.      I  personally performed the services described in this documentation, which was scribed in my presence. The recorded information has been reviewed and considered for accuracy and completeness, addended by me as needed, and agree with information above.  Signed,   Meredith Staggers, MD Primary Care at [facility].  12/03/16 8:33 PM

## 2016-12-03 NOTE — Patient Instructions (Addendum)
I will check your blood count and will have that resolved later this afternoon. I will also check to see if there is blood in the stool, that can be seen sometimes with ulcers. If blood count or blood in the stool is abnormal, would recommend further evaluation at that time. I will call you with those results.  As we discussed, I am very concerned about your fast heart rate and irregular heart rate as well as fatigue as those symptoms may be form your heart, not stomach. I am not able to tell you about heart rhythms further without an EKG. If you are having a heart arrhythmia such as atrial fibrillation, that can cause your blood pressure to drop so low that you pass out or possible death. Additionally it can cause stroke from blood clots to the brain, and even possible lack of blood flow to other areas, including intestines. I would recommend EKG and other evaluation of fatigue and heart rate, so if you change your mind, please return here, emergency room, or any medical provider. Also please follow up with your cardiologist as soon as possible, as I am concerned your congestive heart failure may also be contributing to your symptoms, especially off the medications.  Return to the clinic, call 911, or go to the nearest emergency room if any of your symptoms worsen or new symptoms occur.   IF you received an x-ray today, you will receive an invoice from [facility] Radiology. Please contact [facility] Radiology at [phone] with questions or concerns regarding your invoice.   IF you received labwork today, you will receive an invoice from [facility]. Please contact [facility] at [phone] with questions or concerns regarding your invoice.   Our billing staff will not be able to assist you with questions regarding bills from these companies.  You will be contacted with the lab results as soon as they are available. The fastest way to get your results is to activate your My Chart account. Instructions  are located on the last page of this paperwork. If you have not heard from [place] regarding the results in 2 weeks, please contact this office.

## 2016-12-05 ENCOUNTER — Encounter (HOSPITAL_COMMUNITY): Payer: Self-pay | Admitting: *Deleted

## 2016-12-05 ENCOUNTER — Other Ambulatory Visit (HOSPITAL_COMMUNITY): Payer: Self-pay | Admitting: *Deleted

## 2016-12-05 ENCOUNTER — Encounter (HOSPITAL_COMMUNITY): Payer: Self-pay

## 2016-12-05 ENCOUNTER — Ambulatory Visit (HOSPITAL_COMMUNITY)
Admission: RE | Admit: 2016-12-05 | Discharge: 2016-12-05 | Disposition: A | Discharge status: Home / Self Care | Payer: BLUE CROSS/BLUE SHIELD | Source: Ambulatory Visit | Attending: Cardiology | Admitting: Cardiology

## 2016-12-05 VITALS — BP 140/110 | HR 69 | Wt 209.2 lb

## 2016-12-05 DIAGNOSIS — I10 Essential (primary) hypertension: Secondary | ICD-10-CM | POA: Diagnosis not present

## 2016-12-05 DIAGNOSIS — I493 Ventricular premature depolarization: Secondary | ICD-10-CM | POA: Diagnosis not present

## 2016-12-05 DIAGNOSIS — I4891 Unspecified atrial fibrillation: Secondary | ICD-10-CM | POA: Diagnosis present

## 2016-12-05 DIAGNOSIS — N529 Male erectile dysfunction, unspecified: Secondary | ICD-10-CM | POA: Diagnosis not present

## 2016-12-05 DIAGNOSIS — I5021 Acute systolic (congestive) heart failure: Secondary | ICD-10-CM | POA: Diagnosis not present

## 2016-12-05 DIAGNOSIS — I110 Hypertensive heart disease with heart failure: Secondary | ICD-10-CM | POA: Diagnosis present

## 2016-12-05 DIAGNOSIS — I5022 Chronic systolic (congestive) heart failure: Secondary | ICD-10-CM

## 2016-12-05 DIAGNOSIS — I5023 Acute on chronic systolic (congestive) heart failure: Secondary | ICD-10-CM | POA: Insufficient documentation

## 2016-12-05 DIAGNOSIS — I499 Cardiac arrhythmia, unspecified: Secondary | ICD-10-CM

## 2016-12-05 DIAGNOSIS — I429 Cardiomyopathy, unspecified: Secondary | ICD-10-CM | POA: Insufficient documentation

## 2016-12-05 LAB — BASIC METABOLIC PANEL
ANION GAP: 8 (ref 5–15)
BUN: 19 mg/dL (ref 6–20)
CALCIUM: 8.7 mg/dL — AB (ref 8.9–10.3)
CHLORIDE: 105 mmol/L (ref 101–111)
CO2: 24 mmol/L (ref 22–32)
Creatinine, Ser: 1.49 mg/dL — ABNORMAL HIGH (ref 0.61–1.24)
GFR, EST AFRICAN AMERICAN: 55 mL/min — AB (ref >60)
GFR, EST NON AFRICAN AMERICAN: 48 mL/min — AB (ref >60)
Glucose, Bld: 117 mg/dL — ABNORMAL HIGH (ref 65–99)
POTASSIUM: 4.2 mmol/L (ref 3.5–5.1)
Sodium: 137 mmol/L (ref 135–145)

## 2016-12-05 LAB — CBC
HCT: 40.8 % (ref 39.0–52.0)
Hemoglobin: 13.8 g/dL (ref 13.0–17.0)
MCH: 33.3 pg (ref 26.0–34.0)
MCHC: 33.8 g/dL (ref 30.0–36.0)
MCV: 98.3 fL (ref 78.0–100.0)
PLATELETS: 283 10*3/uL (ref 150–400)
RBC: 4.15 MIL/uL — AB (ref 4.22–5.81)
RDW: 12.8 % (ref 11.5–15.5)
WBC: 6.3 10*3/uL (ref 4.0–10.5)

## 2016-12-05 LAB — BRAIN NATRIURETIC PEPTIDE: B NATRIURETIC PEPTIDE 5: 544.5 pg/mL — AB (ref 0.0–100.0)

## 2016-12-05 MED ORDER — APIXABAN 5 MG PO TABS: 5.0000 mg | ORAL_TABLET | Freq: Two times a day (BID) | ORAL | 6 refills | Status: DC

## 2016-12-05 MED ORDER — POTASSIUM CHLORIDE CRYS ER 10 MEQ PO TBCR
20.0000 meq | EXTENDED_RELEASE_TABLET | Freq: Every day | ORAL | 3 refills | Status: DC
Start: 2016-12-05 — End: 2016-12-20

## 2016-12-05 MED ORDER — FUROSEMIDE 20 MG PO TABS: 40.0000 mg | ORAL_TABLET | Freq: Every day | ORAL | 3 refills | Status: DC

## 2016-12-05 MED ORDER — DIGOXIN 125 MCG PO TABS: 0.1250 mg | ORAL_TABLET | Freq: Every day | ORAL | 3 refills | Status: DC

## 2016-12-05 MED ORDER — CARVEDILOL 6.25 MG PO TABS: 3.1250 mg | ORAL_TABLET | Freq: Two times a day (BID) | ORAL | 6 refills | Status: DC

## 2016-12-05 MED ORDER — SACUBITRIL-VALSARTAN 24-26 MG PO TABS: 1.0000 | ORAL_TABLET | Freq: Two times a day (BID) | ORAL | 3 refills | Status: DC

## 2016-12-05 NOTE — Progress Notes (Signed)
PCP: Dr. Neva Seat Cardiology: Dr [name]  66 yo with history of HTN and nonischemic cardiomyopathy presents for cardiology followup.  Patient has no prior cardiac history.  He used to be an Museum/gallery curator until about 2014, he stopped due to symptomatic inguinal hernias.  In 3/17, he had bilateral inguinal hernia repairs. Around 6/17, he began to develop exertional dyspnea.  There was no inciting event.  This became gradually progressive.  He took a trip to [place] in 9/17 and had a really hard time walking around and keeping up.  He noted dyspnea walking up stairs and hills.  No chest pain or pressure.  He had frequent PVCs by ECGs but does not feel palpitations.  No lightheadedness or syncope. I had him get an echocardiogram.  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  He then had LHC/RHC done, showing decreased cardiac output and no significant CAD. He was started on cardiac meds and Lasix.  He felt much better with medical treatment.  His functional status returned to normal and he was doing some jogging as well.  After feeling better, he decided to stop all his meds. I saw him in 1/18 and encouraged him to restart his meds, but he wanted to stay off them.  He agreed to get an echo and restart his meds if EF remained down, but he canceled his echo.   For the last 2-3 weeks, he has been markedly fatigued and short of breath. Stairs are very difficult (fatigue and dyspnea). He cannot jog.  He is very fatigued when he tries to walk more than a few minutes.  He is very anxious.  He has developed lower extremity edema.  He feels like he felt back in the fall when his cardiomyopathy was diagnosed.  He restarted taking Lasix 20 mg daily but not his other meds.  He has developed orthopnea.  He was found today to be in atrial fibrillation with RVR, he has not felt palpitations, so not definitely sure how long this has been going on. BP is high.   ECG (Personally reviewed): atrial  fibrillation with RVR at 137, LVH with repolarization abnormality.   Labs (10/17): K 4.1, creatinine 1.38 => 1.33, hgb 14.4, BNP 927 Labs (11/17): K 4.2, creatinine 1.22, BNP 447, digoxin 0.3, SPEP negative.  Labs (12/17): K 4.5, creatinine 1.19, BNP 311  PMH: 1. H/o inguinal hernia repair 3/17.  2. HTN 3. Erectile dysfunction 4. Chronic systolic CHF: Nonischemic cardiomyopathy.  - Echo (10/17): EF 20-25%, increased apical trabeculations possibly consistent with noncompaction, diffuse hypokinesis, mildly dilated RV, moderate to severe MR.  - RHC/LHC (10/17): 20% distal LM, mean RA 1, PA 40/18, mean PCWP 18, CI 1.9, PVR 2.5 WU. 5. PVCs: Frequent.  6. Atrial fibrillation: Noted for the first time in 3/18.  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  Quit smoking 23 years ago.  Ultra-marathoner until 3 years ago.   FH: No heart problems that he knows of.   ROS: All systems reviewed and negative except as per HPI.   Current Outpatient Prescriptions  Medication Sig Dispense Refill  . furosemide (LASIX) 20 MG tablet Take 2 tablets (40 mg total) by mouth daily. 60 tablet 3  . acetaminophen (TYLENOL) 500 MG tablet Take 500 mg by mouth every 6 (six) hours as needed (for pain. (SELDOM)).    Marland Kitchen apixaban (ELIQUIS) 5 MG TABS tablet Take 1 tablet (5 mg total) by mouth  2 (two) times daily. 60 tablet 6  . carvedilol (COREG) 6.25 MG tablet Take 0.5 tablets (3.125 mg total) by mouth 2 (two) times daily with a meal. 60 tablet 6  . digoxin (LANOXIN) 0.125 MG tablet Take 1 tablet (0.125 mg total) by mouth daily. 30 tablet 3  . potassium chloride (K-DUR,KLOR-CON) 10 MEQ tablet Take 2 tablets (20 mEq total) by mouth daily. 60 tablet 3  . sacubitril-valsartan (ENTRESTO) 24-26 MG Take 1 tablet by mouth 2 (two) times daily. 60 tablet 3   No current facility-administered medications for this encounter.    BP (!) 140/110   Pulse 69    Wt 209 lb 4 oz (94.9 kg)   SpO2 99%   BMI 30.46 kg/m  General: mildly tachypneic Neck: JVP 14 cm, no thyromegaly or thyroid nodule.  Lungs: Clear to auscultation bilaterally with normal respiratory effort. CV: Nondisplaced PMI.  Heart mildly tachy, irregular S1S2, no murmur.  2+ edema to knees bilaterally.  No carotid bruit.  Normal pedal pulses.  Abdomen: Soft, nontender, no hepatosplenomegaly, no distention.  Skin: Intact without lesions or rashes.  Neurologic: Alert and oriented x 3.  Psych: Anxious Extremities: No clubbing or cyanosis.  HEENT: Normal.   Assessment/Plan: 1. Acute on chronic systolic CHF: Nonischemic cardiomyopathy. 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  Etiology of his CHF is not clear => no definite inciting event.  He has a history of HTN but doubt this was the only trigger.  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  This would ideally be confirmed by cMRI, but he has not wanted an MRI (concerned about side effects).  Would ideally quantify his PVCs to see how likely it is that these are contributing to his cardiomyopathy, but he has not wanted to wear a holter. No family history of CMP.  Cannot rule out viral myocarditis. SPEP negative.  With medical management, he initially felt a lot better.  Unfortunately, he stopped all his medications and is now back with volume overload, NYHA class IIIb symptoms, and atrial fibrillation with RVR.  - Need to get him back in NSR (see below).  - We had a long discussion about his need to take medications for CHF.   - Increase Lasix to 40 mg bid and add KCl 20 daily.  - Add back Coreg 3.125 mg bid to help with rate control.  Also restart digoxin 0.125 daily (follow levels).  - Restart Entresto 24/26 bid.  - In the past, I recommended a cardiac MRI to assess for infiltrative disease/myocarditis.  He is leery about getting an MRI done. - QRS not wide enough  for CRT benefit.  - BMET/BNP today. 2. PVCs: Frequent PVCs in the past.  He does not feel them.  It is possible that he has a PVC-mediated CMP, but PVCs also could just be a product of the cardiomyopathy itself. He will need 48 hour holter to quantify PVCs, will readdress this once we have him back in NSR.  3. HTN: BP high.  We are going to get him back on his cardiac meds, hopefully this will help. 4. Atrial fibrillation: With RVR.  New diagnosis.  Suspect this is worsening CHF.  We need to get him back in NSR.  - Start Eliquis 5 mg bid.  - After 5 doses of Eliquis (Friday afternoon), plan TEE-guided DCCV.   - For now, given no prior atrial fibrillation, will hold off on anti-arrhythmic and see  if he will go back into NSR long-term with DCCV alone.   He will followup with PA next week and me in 2 wks.    Marca Ancona 12/05/2016

## 2016-12-05 NOTE — Patient Instructions (Signed)
Start Eliquis 5 mg Twice daily TAKE 1 DOSE NOW AND 2ND DOSE TONIGHT THEN TAKE Twice daily   Restart Entresto 24/26 mg Twice daily   Restart Digoxin 0.125 mg daily  Restart Carvedilol at 3.125 mg (1/2 tab) Twice daily   Restart Furosemide at 40 mg (2 tabs) Twice daily   Restart Potassium (k-dur) at 20 meq (2 tabs) daily  Labs today  Your physician has requested that you have a TEE/Cardioversion. During a TEE, sound waves are used to create images of your heart. It provides your doctor with information about the size and shape of your heart and how well your heart's chambers and valves are working. In this test, a transducer is attached to the end of a flexible tube that is guided down you throat and into your esophagus (the tube leading from your mouth to your stomach) to get a more detailed image of your heart. Once the TEE has determined that a blood clot is not present, the cardioversion begins. Electrical Cardioversion uses a jolt of electricity to your heart either through paddles or wired patches attached to your chest. This is a controlled, usually prescheduled, procedure. This procedure is done at the hospital and you are not awake during the procedure. You usually go home the day of the procedure. Please see the instruction sheet given to you today for more information.  Your physician recommends that you schedule a follow-up appointment in: 1 week with Otilio Saber, PA  Your physician recommends that you schedule a follow-up appointment in: 2 weeks with Dr [name]

## 2016-12-06 ENCOUNTER — Encounter (HOSPITAL_COMMUNITY): Payer: BLUE CROSS/BLUE SHIELD

## 2016-12-07 ENCOUNTER — Ambulatory Visit (HOSPITAL_COMMUNITY)
Admission: RE | Admit: 2016-12-07 | Discharge: 2016-12-07 | Disposition: A | Discharge status: Home / Self Care | Payer: BLUE CROSS/BLUE SHIELD | Source: Ambulatory Visit | Attending: Cardiology | Admitting: Cardiology

## 2016-12-07 ENCOUNTER — Encounter (HOSPITAL_COMMUNITY)
Admission: RE | Disposition: A | Discharge status: Home / Self Care | Payer: Self-pay | Source: Ambulatory Visit | Attending: Cardiology

## 2016-12-07 ENCOUNTER — Ambulatory Visit (HOSPITAL_COMMUNITY): Payer: BLUE CROSS/BLUE SHIELD | Admitting: Anesthesiology

## 2016-12-07 ENCOUNTER — Ambulatory Visit (HOSPITAL_BASED_OUTPATIENT_CLINIC_OR_DEPARTMENT_OTHER): Payer: BLUE CROSS/BLUE SHIELD

## 2016-12-07 ENCOUNTER — Encounter (HOSPITAL_COMMUNITY): Payer: Self-pay | Admitting: *Deleted

## 2016-12-07 DIAGNOSIS — N529 Male erectile dysfunction, unspecified: Secondary | ICD-10-CM | POA: Diagnosis not present

## 2016-12-07 DIAGNOSIS — Z7901 Long term (current) use of anticoagulants: Secondary | ICD-10-CM | POA: Diagnosis not present

## 2016-12-07 DIAGNOSIS — I4891 Unspecified atrial fibrillation: Secondary | ICD-10-CM | POA: Insufficient documentation

## 2016-12-07 DIAGNOSIS — Z87891 Personal history of nicotine dependence: Secondary | ICD-10-CM | POA: Diagnosis not present

## 2016-12-07 DIAGNOSIS — I340 Nonrheumatic mitral (valve) insufficiency: Secondary | ICD-10-CM

## 2016-12-07 DIAGNOSIS — I5022 Chronic systolic (congestive) heart failure: Secondary | ICD-10-CM | POA: Insufficient documentation

## 2016-12-07 DIAGNOSIS — I428 Other cardiomyopathies: Secondary | ICD-10-CM | POA: Diagnosis not present

## 2016-12-07 DIAGNOSIS — I110 Hypertensive heart disease with heart failure: Secondary | ICD-10-CM | POA: Diagnosis not present

## 2016-12-07 HISTORY — PX: TEE WITHOUT CARDIOVERSION: SHX5443

## 2016-12-07 HISTORY — PX: CARDIOVERSION: SHX1299

## 2016-12-07 SURGERY — ECHOCARDIOGRAM, TRANSESOPHAGEAL
Anesthesia: Monitor Anesthesia Care

## 2016-12-07 MED ORDER — PROPOFOL 500 MG/50ML IV EMUL
INTRAVENOUS | Status: DC | PRN
  Administered 2016-12-07: 50 ug/kg/min via INTRAVENOUS

## 2016-12-07 MED ORDER — SODIUM CHLORIDE 0.9 % IV SOLN: INTRAVENOUS | Status: DC

## 2016-12-07 MED ORDER — PROPOFOL 10 MG/ML IV BOLUS
INTRAVENOUS | Status: DC | PRN
  Administered 2016-12-07: 20 mg via INTRAVENOUS

## 2016-12-07 MED ORDER — FUROSEMIDE 20 MG PO TABS: 40.0000 mg | ORAL_TABLET | Freq: Two times a day (BID) | ORAL | 3 refills | Status: DC

## 2016-12-07 MED ORDER — SODIUM CHLORIDE 0.9 % IV SOLN
INTRAVENOUS | Status: DC | PRN
  Administered 2016-12-07: 13:00:00 via INTRAVENOUS

## 2016-12-07 MED ORDER — LIDOCAINE 2% (20 MG/ML) 5 ML SYRINGE
INTRAMUSCULAR | Status: DC | PRN
Start: 2016-12-07 — End: 2016-12-07
  Administered 2016-12-07: 60 mg via INTRAVENOUS

## 2016-12-07 MED ORDER — BUTAMBEN-TETRACAINE-BENZOCAINE 2-2-14 % EX AERO
INHALATION_SPRAY | CUTANEOUS | Status: DC | PRN
  Administered 2016-12-07: 2 via TOPICAL

## 2016-12-07 NOTE — Discharge Instructions (Signed)
Electrical Cardioversion, Care After °This sheet gives you information about how to care for yourself after your procedure. Your health care provider may also give you more specific instructions. If you have problems or questions, contact your health care provider. °What can I expect after the procedure? °After the procedure, it is common to have: °· Some redness on the skin where the shocks were given. °Follow these instructions at home: °· Do not drive for 24 hours if you were given a medicine to help you relax (sedative). °· Take over-the-counter and prescription medicines only as told by your health care provider. °· Ask your health care provider how to check your pulse. Check it often. °· Rest for 48 hours after the procedure or as told by your health care provider. °· Avoid or limit your caffeine use as told by your health care provider. °Contact a health care provider if: °· You feel like your heart is beating too quickly or your pulse is not regular. °· You have a serious muscle cramp that does not go away. °Get help right away if: °· You have discomfort in your chest. °· You are dizzy or you feel faint. °· You have trouble breathing or you are short of breath. °· Your speech is slurred. °· You have trouble moving an arm or leg on one side of your body. °· Your fingers or toes turn cold or blue. °This information is not intended to replace advice given to you by your health care provider. Make sure you discuss any questions you have with your health care provider. °Document Released: 06/17/2013 Document Revised: 03/30/2016 Document Reviewed: 03/02/2016 °Elsevier Interactive Patient Education © 2017 [employer] ° °

## 2016-12-07 NOTE — Transfer of Care (Signed)
Immediate Anesthesia Transfer of Care Note  Patient: Bryan Harmon  Procedure(s) Performed: Procedure(s): TRANSESOPHAGEAL ECHOCARDIOGRAM (TEE) (N/A) CARDIOVERSION (N/A)  Patient Location: Endoscopy Unit  Anesthesia Type:MAC  Level of Consciousness: awake, alert , oriented and patient cooperative  Airway & Oxygen Therapy: Patient Spontanous Breathing and Patient connected to nasal cannula oxygen  Post-op Assessment: Report given to RN, Post -op Vital signs reviewed and stable and Patient moving all extremities X 4  Post vital signs: Reviewed and stable  Last Vitals:  Vitals:   12/07/16 1143  BP: (!) 127/110  Resp: 17  Temp: 36.4 C    Last Pain:  Vitals:   12/07/16 1143  TempSrc: Oral         Complications: No apparent anesthesia complications

## 2016-12-07 NOTE — H&P (View-Only) (Signed)
PCP: Dr. Neva Seat Cardiology: Dr [name]  66 yo with history of HTN and nonischemic cardiomyopathy presents for cardiology followup.  Patient has no prior cardiac history.  He used to be an Museum/gallery curator until about 2014, he stopped due to symptomatic inguinal hernias.  In 3/17, he had bilateral inguinal hernia repairs. Around 6/17, he began to develop exertional dyspnea.  There was no inciting event.  This became gradually progressive.  He took a trip to [place] in 9/17 and had a really hard time walking around and keeping up.  He noted dyspnea walking up stairs and hills.  No chest pain or pressure.  He had frequent PVCs by ECGs but does not feel palpitations.  No lightheadedness or syncope. I had him get an echocardiogram.  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  He then had LHC/RHC done, showing decreased cardiac output and no significant CAD. He was started on cardiac meds and Lasix.  He felt much better with medical treatment.  His functional status returned to normal and he was doing some jogging as well.  After feeling better, he decided to stop all his meds. I saw him in 1/18 and encouraged him to restart his meds, but he wanted to stay off them.  He agreed to get an echo and restart his meds if EF remained down, but he canceled his echo.   For the last 2-3 weeks, he has been markedly fatigued and short of breath. Stairs are very difficult (fatigue and dyspnea). He cannot jog.  He is very fatigued when he tries to walk more than a few minutes.  He is very anxious.  He has developed lower extremity edema.  He feels like he felt back in the fall when his cardiomyopathy was diagnosed.  He restarted taking Lasix 20 mg daily but not his other meds.  He has developed orthopnea.  He was found today to be in atrial fibrillation with RVR, he has not felt palpitations, so not definitely sure how long this has been going on. BP is high.   ECG (Personally reviewed): atrial  fibrillation with RVR at 137, LVH with repolarization abnormality.   Labs (10/17): K 4.1, creatinine 1.38 => 1.33, hgb 14.4, BNP 927 Labs (11/17): K 4.2, creatinine 1.22, BNP 447, digoxin 0.3, SPEP negative.  Labs (12/17): K 4.5, creatinine 1.19, BNP 311  PMH: 1. H/o inguinal hernia repair 3/17.  2. HTN 3. Erectile dysfunction 4. Chronic systolic CHF: Nonischemic cardiomyopathy.  - Echo (10/17): EF 20-25%, increased apical trabeculations possibly consistent with noncompaction, diffuse hypokinesis, mildly dilated RV, moderate to severe MR.  - RHC/LHC (10/17): 20% distal LM, mean RA 1, PA 40/18, mean PCWP 18, CI 1.9, PVR 2.5 WU. 5. PVCs: Frequent.  6. Atrial fibrillation: Noted for the first time in 3/18.  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  Quit smoking 23 years ago.  Ultra-marathoner until 3 years ago.   FH: No heart problems that he knows of.   ROS: All systems reviewed and negative except as per HPI.   Current Outpatient Prescriptions  Medication Sig Dispense Refill  . furosemide (LASIX) 20 MG tablet Take 2 tablets (40 mg total) by mouth daily. 60 tablet 3  . acetaminophen (TYLENOL) 500 MG tablet Take 500 mg by mouth every 6 (six) hours as needed (for pain. (SELDOM)).    Marland Kitchen apixaban (ELIQUIS) 5 MG TABS tablet Take 1 tablet (5 mg total) by mouth  2 (two) times daily. 60 tablet 6  . carvedilol (COREG) 6.25 MG tablet Take 0.5 tablets (3.125 mg total) by mouth 2 (two) times daily with a meal. 60 tablet 6  . digoxin (LANOXIN) 0.125 MG tablet Take 1 tablet (0.125 mg total) by mouth daily. 30 tablet 3  . potassium chloride (K-DUR,KLOR-CON) 10 MEQ tablet Take 2 tablets (20 mEq total) by mouth daily. 60 tablet 3  . sacubitril-valsartan (ENTRESTO) 24-26 MG Take 1 tablet by mouth 2 (two) times daily. 60 tablet 3   No current facility-administered medications for this encounter.    BP (!) 140/110   Pulse 69    Wt 209 lb 4 oz (94.9 kg)   SpO2 99%   BMI 30.46 kg/m  General: mildly tachypneic Neck: JVP 14 cm, no thyromegaly or thyroid nodule.  Lungs: Clear to auscultation bilaterally with normal respiratory effort. CV: Nondisplaced PMI.  Heart mildly tachy, irregular S1S2, no murmur.  2+ edema to knees bilaterally.  No carotid bruit.  Normal pedal pulses.  Abdomen: Soft, nontender, no hepatosplenomegaly, no distention.  Skin: Intact without lesions or rashes.  Neurologic: Alert and oriented x 3.  Psych: Anxious Extremities: No clubbing or cyanosis.  HEENT: Normal.   Assessment/Plan: 1. Acute on chronic systolic CHF: Nonischemic cardiomyopathy. 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  Etiology of his CHF is not clear => no definite inciting event.  He has a history of HTN but doubt this was the only trigger.  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  This would ideally be confirmed by cMRI, but he has not wanted an MRI (concerned about side effects).  Would ideally quantify his PVCs to see how likely it is that these are contributing to his cardiomyopathy, but he has not wanted to wear a holter. No family history of CMP.  Cannot rule out viral myocarditis. SPEP negative.  With medical management, he initially felt a lot better.  Unfortunately, he stopped all his medications and is now back with volume overload, NYHA class IIIb symptoms, and atrial fibrillation with RVR.  - Need to get him back in NSR (see below).  - We had a long discussion about his need to take medications for CHF.   - Increase Lasix to 40 mg bid and add KCl 20 daily.  - Add back Coreg 3.125 mg bid to help with rate control.  Also restart digoxin 0.125 daily (follow levels).  - Restart Entresto 24/26 bid.  - In the past, I recommended a cardiac MRI to assess for infiltrative disease/myocarditis.  He is leery about getting an MRI done. - QRS not wide enough  for CRT benefit.  - BMET/BNP today. 2. PVCs: Frequent PVCs in the past.  He does not feel them.  It is possible that he has a PVC-mediated CMP, but PVCs also could just be a product of the cardiomyopathy itself. He will need 48 hour holter to quantify PVCs, will readdress this once we have him back in NSR.  3. HTN: BP high.  We are going to get him back on his cardiac meds, hopefully this will help. 4. Atrial fibrillation: With RVR.  New diagnosis.  Suspect this is worsening CHF.  We need to get him back in NSR.  - Start Eliquis 5 mg bid.  - After 5 doses of Eliquis (Friday afternoon), plan TEE-guided DCCV.   - For now, given no prior atrial fibrillation, will hold off on anti-arrhythmic and see  if he will go back into NSR long-term with DCCV alone.   He will followup with PA next week and me in 2 wks.    Marca Ancona 12/05/2016

## 2016-12-07 NOTE — Interval H&P Note (Signed)
History and Physical Interval Note:  12/07/2016 1:20 PM  Bryan Harmon  has presented today for surgery, with the diagnosis of AFIB  The various methods of treatment have been discussed with the patient and family. After consideration of risks, benefits and other options for treatment, the patient has consented to  Procedure(s): TRANSESOPHAGEAL ECHOCARDIOGRAM (TEE) (N/A) CARDIOVERSION (N/A) as a surgical intervention .  The patient's history has been reviewed, patient examined, no change in status, stable for surgery.  I have reviewed the patient's chart and labs.  Questions were answered to the patient's satisfaction.     Dalton [employer]

## 2016-12-07 NOTE — Anesthesia Preprocedure Evaluation (Addendum)
Anesthesia Evaluation  Patient identified by MRN, date of birth, ID band Patient awake    Reviewed: Allergy & Precautions, NPO status , Patient's Chart, lab work & pertinent test results  Airway Mallampati: II  TM Distance: >3 FB Neck ROM: Full    Dental no notable dental hx. (+) Edentulous Upper   Pulmonary former smoker,    breath sounds clear to auscultation       Cardiovascular hypertension, + dysrhythmias  Rhythm:Irregular Rate:Tachycardia     Neuro/Psych    GI/Hepatic negative GI ROS, Neg liver ROS,   Endo/Other  negative endocrine ROS  Renal/GU negative Renal ROS     Musculoskeletal   Abdominal   Peds  Hematology negative hematology ROS (+)   Anesthesia Other Findings   Reproductive/Obstetrics                            Anesthesia Physical Anesthesia Plan  ASA: III  Anesthesia Plan: MAC   Post-op Pain Management:    Induction: Intravenous  Airway Management Planned: Nasal Cannula and Natural Airway  Additional Equipment:   Intra-op Plan:   Post-operative Plan:   Informed Consent: I have reviewed the patients History and Physical, chart, labs and discussed the procedure including the risks, benefits and alternatives for the proposed anesthesia with the patient or authorized representative who has indicated his/her understanding and acceptance.     Plan Discussed with: CRNA  Anesthesia Plan Comments:         Anesthesia Quick Evaluation

## 2016-12-07 NOTE — Procedures (Signed)
Electrical Cardioversion Procedure Note AZARION NORBY [id number] 1951/08/19  Procedure: Electrical Cardioversion Indications:  Atrial Fibrillation.  He has been on Eliquis x 5 doses, no LA appendage thrombus on TEE.   Procedure Details Consent: Risks of procedure as well as the alternatives and risks of each were explained to the (patient/caregiver).  Consent for procedure obtained. Time Out: Verified patient identification, verified procedure, site/side was marked, verified correct patient position, special equipment/implants available, medications/allergies/relevent history reviewed, required imaging and test results available.  Performed  Patient placed on cardiac monitor, pulse oximetry, supplemental oxygen as necessary.  Sedation given: Propofol per anesthesiology Pacer pads placed anterior and posterior chest.  Cardioverted 1 time(s).  Cardioverted at 200J.  Evaluation Findings: Post procedure EKG shows: NSR.  There were frequent PACs and PVCs.  Complications: None Patient did tolerate procedure well.  His rhythm did not look very stable after DCCV with lots of ectopy. If he goes back into atrial fibrillation, will need to load amiodarone and try again with cardioversion.   Marca Ancona 12/07/2016, 1:47 PM

## 2016-12-07 NOTE — CV Procedure (Signed)
Procedure: TEE  Indication: Atrial fibrillation.   Sedation: Per anesthesiology.   Findings: Please see echo section for full report.  The patient was in atrial fibrillation with RVR.  Normal LV size and wall thickness.  EF 25%, diffuse hypokinesis.  Normal RV size with mildly decreased systolic function.  Normal RA size.  Mild LA enlargement.  No LA appendage thrombus.   Mild TR.  Mild mitral regurgitation.  Trileaflet aortic valve with no stenosis or regurgitation.  No evidence for PFO or ASD by color doppler.  Normal caliber aorta with mild plaque in descending thoracic aorta.   Impression: No LA thrombus, may proceed to DCCV.   Marca Ancona 12/07/2016 1:47 PM

## 2016-12-10 ENCOUNTER — Encounter (HOSPITAL_COMMUNITY): Payer: Self-pay | Admitting: Cardiology

## 2016-12-10 NOTE — Anesthesia Postprocedure Evaluation (Addendum)
Anesthesia Post Note  Patient: Bryan Harmon  Procedure(s) Performed: Procedure(s) (LRB): TRANSESOPHAGEAL ECHOCARDIOGRAM (TEE) (N/A) CARDIOVERSION (N/A)  Patient location during evaluation: Endoscopy Anesthesia Type: MAC Level of consciousness: awake and alert Pain management: pain level controlled Vital Signs Assessment: post-procedure vital signs reviewed and stable Respiratory status: spontaneous breathing, nonlabored ventilation, respiratory function stable and patient connected to nasal cannula oxygen Cardiovascular status: stable and blood pressure returned to baseline Anesthetic complications: no       Last Vitals:  Vitals:   12/07/16 1435 12/07/16 1445  BP: (!) 124/92 121/85  Pulse: 69 65  Resp: 19 (!) 21  Temp:      Last Pain:  Vitals:   12/07/16 1356  TempSrc: Oral                 Michaiah Holsopple,JAMES TERRILL

## 2016-12-13 ENCOUNTER — Encounter (HOSPITAL_COMMUNITY): Payer: Self-pay

## 2016-12-13 ENCOUNTER — Ambulatory Visit (HOSPITAL_COMMUNITY)
Admission: RE | Admit: 2016-12-13 | Discharge: 2016-12-13 | Disposition: A | Discharge status: Home / Self Care | Payer: BLUE CROSS/BLUE SHIELD | Source: Ambulatory Visit | Attending: Internal Medicine | Admitting: Internal Medicine

## 2016-12-13 VITALS — BP 144/82 | HR 77 | Wt 194.0 lb

## 2016-12-13 DIAGNOSIS — N529 Male erectile dysfunction, unspecified: Secondary | ICD-10-CM | POA: Diagnosis not present

## 2016-12-13 DIAGNOSIS — I5022 Chronic systolic (congestive) heart failure: Secondary | ICD-10-CM | POA: Insufficient documentation

## 2016-12-13 DIAGNOSIS — I517 Cardiomegaly: Secondary | ICD-10-CM | POA: Insufficient documentation

## 2016-12-13 DIAGNOSIS — K4020 Bilateral inguinal hernia, without obstruction or gangrene, not specified as recurrent: Secondary | ICD-10-CM | POA: Diagnosis not present

## 2016-12-13 DIAGNOSIS — I493 Ventricular premature depolarization: Secondary | ICD-10-CM | POA: Insufficient documentation

## 2016-12-13 DIAGNOSIS — Z9889 Other specified postprocedural states: Secondary | ICD-10-CM | POA: Diagnosis not present

## 2016-12-13 DIAGNOSIS — I480 Paroxysmal atrial fibrillation: Secondary | ICD-10-CM | POA: Insufficient documentation

## 2016-12-13 DIAGNOSIS — I4891 Unspecified atrial fibrillation: Secondary | ICD-10-CM | POA: Insufficient documentation

## 2016-12-13 DIAGNOSIS — I110 Hypertensive heart disease with heart failure: Secondary | ICD-10-CM | POA: Insufficient documentation

## 2016-12-13 DIAGNOSIS — Z7901 Long term (current) use of anticoagulants: Secondary | ICD-10-CM | POA: Diagnosis not present

## 2016-12-13 DIAGNOSIS — R002 Palpitations: Secondary | ICD-10-CM | POA: Diagnosis not present

## 2016-12-13 DIAGNOSIS — I10 Essential (primary) hypertension: Secondary | ICD-10-CM

## 2016-12-13 LAB — CBC
HCT: 43.9 % (ref 39.0–52.0)
Hemoglobin: 15 g/dL (ref 13.0–17.0)
MCH: 32.7 pg (ref 26.0–34.0)
MCHC: 34.2 g/dL (ref 30.0–36.0)
MCV: 95.6 fL (ref 78.0–100.0)
PLATELETS: 276 10*3/uL (ref 150–400)
RBC: 4.59 MIL/uL (ref 4.22–5.81)
RDW: 12.3 % (ref 11.5–15.5)
WBC: 5.4 10*3/uL (ref 4.0–10.5)

## 2016-12-13 LAB — BASIC METABOLIC PANEL
Anion gap: 7 (ref 5–15)
BUN: 17 mg/dL (ref 6–20)
CO2: 24 mmol/L (ref 22–32)
Calcium: 9 mg/dL (ref 8.9–10.3)
Chloride: 104 mmol/L (ref 101–111)
Creatinine, Ser: 1.25 mg/dL — ABNORMAL HIGH (ref 0.61–1.24)
GFR calc Af Amer: >60 mL/min (ref >60)
GFR calc non Af Amer: 59 mL/min — ABNORMAL LOW (ref >60)
Glucose, Bld: 111 mg/dL — ABNORMAL HIGH (ref 65–99)
Potassium: 4.5 mmol/L (ref 3.5–5.1)
Sodium: 135 mmol/L (ref 135–145)

## 2016-12-13 LAB — BRAIN NATRIURETIC PEPTIDE: B Natriuretic Peptide: 369.2 pg/mL — ABNORMAL HIGH (ref 0.0–100.0)

## 2016-12-13 NOTE — Patient Instructions (Signed)
Routine lab work today. Will notify you of abnormal results, otherwise no news is good news!  Will schedule you for a holter monitor (heart monitor) to be placed at [facility] office. Address: [address] ([address] Floor), [place], [place] [address]  Phone: [phone]  _____________________________________________________________________  _____________________________________________________________________  Follow up as scheduled.  Do the following things EVERYDAY: 1) Weigh yourself in the morning before breakfast. Write it down and keep it in a log. 2) Take your medicines as prescribed 3) Eat low salt foods-Limit salt (sodium) to 2000 mg per day.  4) Stay as active as you can everyday 5) Limit all fluids for the day to less than 2 liters

## 2016-12-13 NOTE — Progress Notes (Signed)
PCP: Dr. 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 male with paroxysmal A fib s/p DCCV 12/07/16 on Eliquis, Chronic systolic CHF from unclear entiology, but thought to be NICM, and HTN.   He used to be an Museum/gallery curator until about 2014, he stopped due to symptomatic inguinal hernias.  In 3/17, he had bilateral inguinal hernia repairs. Around 6/17, he began to develop exertional dyspnea.  There was no inciting event.  This became gradually progressive.  He took a trip to [place] in 9/17 and had a really hard time walking around and keeping up.  He noted dyspnea walking up stairs and hills.  No chest pain or pressure.  He had frequent PVCs by ECGs but does not feel palpitations.  No lightheadedness or syncope. I had him get an echocardiogram.  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  He then had LHC/RHC done, showing decreased cardiac output and no significant CAD. He was started on cardiac meds and Lasix.  He felt much better with medical treatment.  His functional status returned to normal and he was doing some jogging as well.  Encouraged to restart his meds in 09/2016, but pt wished to remain off.   Pt underwent DCCV 12/07/16 for new Afib with RVR at last appointment.   Pt presents today for post DCCV follow up.  Is in NSR by EKG. Lasix increased to 40 mg BID at last appointment. Pt weight shows down 15 lbs. Feeling much better. Feels like lasix is drying him out. Having dry eyes and legs "skinny". Denies lightheadedness or dizziness. No CP or palpitations.  Able to walk flat ground or stairs without SOB. No longer orthopneic. No bendopnea.   ECG: Sinus rhythm 72 bpm with occasional PVC. Reviewed personally.   Review of systems complete and found to be negative unless listed in HPI.    Labs (10/17): K 4.1, creatinine 1.38 => 1.33, hgb 14.4, BNP 927 Labs (11/17): K 4.2, creatinine 1.22, BNP 447, digoxin 0.3, SPEP negative.  Labs (12/17): K 4.5,  creatinine 1.19, BNP 311  PMH: 1. H/o inguinal hernia repair 3/17.  2. HTN 3. Erectile dysfunction 4. Chronic systolic CHF: Nonischemic cardiomyopathy.  - Echo (10/17): EF 20-25%, increased apical trabeculations possibly consistent with noncompaction, diffuse hypokinesis, mildly dilated RV, moderate to severe MR.  - RHC/LHC (10/17): 20% distal LM, mean RA 1, PA 40/18, mean PCWP 18, CI 1.9, PVR 2.5 WU. 5. PVCs: Frequent.  6. Atrial fibrillation: Noted for the first time in 3/18.  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  Quit smoking 23 years ago.  Ultra-marathoner until 3 years ago.   FH: No heart problems that he knows of.   Current Outpatient Prescriptions  Medication Sig Dispense Refill  . acetaminophen (TYLENOL) 500 MG tablet Take 500 mg by mouth every 6 (six) hours as needed (for pain. (SELDOM)).    Marland Kitchen apixaban (ELIQUIS) 5 MG TABS tablet Take 1 tablet (5 mg total) by mouth 2 (two) times daily. 60 tablet 6  . carvedilol (COREG) 6.25 MG tablet Take 0.5 tablets (3.125 mg total) by mouth 2 (two) times daily with a meal. 60 tablet 6  . digoxin (LANOXIN) 0.125 MG tablet Take 1 tablet (0.125 mg total) by mouth daily. 30 tablet 3  . furosemide (LASIX) 20 MG tablet Take 2 tablets (40 mg total) by mouth 2 (two) times daily. 60 tablet 3  . potassium  chloride (K-DUR,KLOR-CON) 10 MEQ tablet Take 2 tablets (20 mEq total) by mouth daily. 60 tablet 3  . sacubitril-valsartan (ENTRESTO) 24-26 MG Take 1 tablet by mouth 2 (two) times daily. 60 tablet 3   No current facility-administered medications for this encounter.    BP (!) 144/82 (BP Location: Left Arm, Patient Position: Sitting, Cuff Size: Normal)   Pulse 77   Wt 194 lb (88 kg)   SpO2 96%   BMI 28.24 kg/m    Wt Readings from Last 3 Encounters:  12/13/16 194 lb (88 kg)  12/07/16 209 lb (94.8 kg)  12/05/16 209 lb 4 oz (94.9 kg)     General: Elderly appearing  caucasian male in NAD.  Neck: Supple. JVP flat. Carotids 2+ bilat; no bruits. No thyromegaly or nodule noted.    Lungs: CTAB, normal effort.  CV: PMI non displaced. Regular rate and rhythm with occasional ectopy.  No peripheral edema. Normal pedal pulses. No carotid bruit.  Abdomen: soft, NT, ND, no HSM. No bruits or masses. +BS  Skin: Intact without lesions or rashes.  Neurologic: Alert & oriented x 3. Cranial nerves grossly intact. Moves all 4 extremities w/o difficulty. Affect flat but appropriate.  Psych: Anxious  Extremities: No clubbing or cyanosis. No edema as above.   HEENT: Normal.  Assessment/Plan: 1. Chronic systolic CHF: Nonischemic cardiomyopathy.  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  Etiology of his CHF is not clear => no definite inciting event.  He has a history of HTN but doubt this was the only trigger.  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  This would ideally be confirmed by cMRI, but he has not wanted an MRI (concerned about side effects).  Would ideally quantify his PVCs to see how likely it is that these are contributing to his cardiomyopathy, but he has not wanted to wear a holter. No family history of CMP.  Cannot rule out viral myocarditis. SPEP negative.  With medical management, he initially felt a lot better.   - TEE last week with EF 25%, mild MR, mild LAE, RV mild/mod reduced. No evidence of thrombus.  - NYHA Class II symptoms back in NSR and with volume down.  - Volume status stable. Continue lasix Lasix to 40 mg bid and KCl 20 meq daily.  - Continue coreg 3.125 mg BID. Refuses up-titration.  - Continue digoxin 0.125 mg daily. Check level at next visit.  - Continue Entresto 24/26 bid. BMET today.  - Ideally needs cMRI to r/o infiltrative disease/myocarditis and to further investigate for LV non-complaction.  He agrees to CONSIDER.  - QRS not wide enough for CRT benefit.  -  Reinforced fluid restriction to < 2 L daily, sodium restriction to less than 2000 mg daily, and the importance of daily weights.   2. PVCs: Frequent PVCs in the past. Occasional noted on EKG - Agrees to 48 hr holter to quantify now in NSR.   3. HTN:  - Mildly elevated but refuses up-titration today.  Consider careful up-titration at next visit.  4. Atrial fibrillation: With RVR.  New diagnosis.  - Remains in NSR s/p TEE DCCV last week - Continue Eliquis 5 mg bid. No bleeding.   Keep follow up with Dr. [name] next week. Labs today and will order 48 holter to quantify PVCs now that he is in NSR. Pt refuses med up-titration today.   Graciella Freer, PA-C  12/13/2016  Greater than 50% of  the 25 minute visit was spent in counseling/coordination of care regarding disease state education, risks and benefits of MRI, medication reconciliation, and discussion of his medical regimen in person with on site Pharm-D.

## 2016-12-20 ENCOUNTER — Encounter (HOSPITAL_COMMUNITY): Payer: Self-pay

## 2016-12-20 ENCOUNTER — Ambulatory Visit (HOSPITAL_COMMUNITY)
Admission: RE | Admit: 2016-12-20 | Discharge: 2016-12-20 | Disposition: A | Discharge status: Home / Self Care | Payer: BLUE CROSS/BLUE SHIELD | Source: Ambulatory Visit | Attending: Cardiology | Admitting: Cardiology

## 2016-12-20 VITALS — BP 140/92 | HR 68 | Wt 191.0 lb

## 2016-12-20 DIAGNOSIS — I5022 Chronic systolic (congestive) heart failure: Secondary | ICD-10-CM | POA: Insufficient documentation

## 2016-12-20 DIAGNOSIS — I493 Ventricular premature depolarization: Secondary | ICD-10-CM | POA: Insufficient documentation

## 2016-12-20 DIAGNOSIS — I110 Hypertensive heart disease with heart failure: Secondary | ICD-10-CM | POA: Diagnosis present

## 2016-12-20 DIAGNOSIS — Z87891 Personal history of nicotine dependence: Secondary | ICD-10-CM | POA: Diagnosis not present

## 2016-12-20 DIAGNOSIS — R001 Bradycardia, unspecified: Secondary | ICD-10-CM | POA: Insufficient documentation

## 2016-12-20 DIAGNOSIS — I480 Paroxysmal atrial fibrillation: Secondary | ICD-10-CM | POA: Diagnosis not present

## 2016-12-20 DIAGNOSIS — N529 Male erectile dysfunction, unspecified: Secondary | ICD-10-CM | POA: Insufficient documentation

## 2016-12-20 DIAGNOSIS — Z7901 Long term (current) use of anticoagulants: Secondary | ICD-10-CM | POA: Diagnosis not present

## 2016-12-20 LAB — BASIC METABOLIC PANEL
Anion gap: 7 (ref 5–15)
BUN: 18 mg/dL (ref 6–20)
CALCIUM: 9 mg/dL (ref 8.9–10.3)
CO2: 27 mmol/L (ref 22–32)
CREATININE: 1.18 mg/dL (ref 0.61–1.24)
Chloride: 107 mmol/L (ref 101–111)
GFR calc non Af Amer: >60 mL/min (ref >60)
Glucose, Bld: 95 mg/dL (ref 65–99)
Potassium: 4.6 mmol/L (ref 3.5–5.1)
SODIUM: 141 mmol/L (ref 135–145)

## 2016-12-20 LAB — DIGOXIN LEVEL: DIGOXIN LVL: 1 ng/mL (ref 0.8–2.0)

## 2016-12-20 MED ORDER — SPIRONOLACTONE 25 MG PO TABS: 25.0000 mg | ORAL_TABLET | Freq: Every day | ORAL | 6 refills | Status: AC

## 2016-12-20 MED ORDER — FUROSEMIDE 20 MG PO TABS: 40.0000 mg | ORAL_TABLET | Freq: Every day | ORAL | 6 refills | Status: DC

## 2016-12-20 NOTE — Patient Instructions (Signed)
STOP Potassium.  DECREASE Lasix to 40 mg (2 tabs) once daily.  START Spironolactone 25 mg (1 tab) once daily.  Routine lab work today. Will notify you of abnormal results, otherwise no news is good news!  Follow up next week with Dr. [name].  __________________________________________________________ [place] Code: 6000  Do the following things EVERYDAY: 1) Weigh yourself in the morning before breakfast. Write it down and keep it in a log. 2) Take your medicines as prescribed 3) Eat low salt foods-Limit salt (sodium) to 2000 mg per day.  4) Stay as active as you can everyday 5) Limit all fluids for the day to less than 2 liters

## 2016-12-21 ENCOUNTER — Ambulatory Visit (INDEPENDENT_AMBULATORY_CARE_PROVIDER_SITE_OTHER): Payer: BLUE CROSS/BLUE SHIELD

## 2016-12-21 DIAGNOSIS — R002 Palpitations: Secondary | ICD-10-CM | POA: Diagnosis not present

## 2016-12-21 NOTE — Progress Notes (Signed)
PCP: Dr. Neva Harmon Cardiology: Dr Bryan Harmon is a 66 y.o. male with paroxysmal A fib s/p DCCV 12/07/16 on Eliquis, Chronic systolic CHF from unclear entiology, but thought to be NICM, and HTN.   He used to be an Museum/gallery curator until about 2014, he stopped due to symptomatic inguinal hernias.  In 3/17, he had bilateral inguinal hernia repairs. Around 6/17, he began to develop exertional dyspnea.  There was no inciting event.  This became gradually progressive.  He took a trip to [place] in 9/17 and had a really hard time walking around and keeping up.  He noted dyspnea walking up stairs and hills.  No chest pain or pressure.  He had frequent PVCs by ECGs but does not feel palpitations.  No lightheadedness or syncope. I had him get an echocardiogram.  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  He then had LHC/RHC done, showing decreased cardiac output and no significant CAD. He was started on cardiac meds and Lasix.  He felt much better with medical treatment.  His functional status returned to normal and he was doing some jogging as well.  Encouraged to restart his meds in 09/2016, but pt wished to remain off.   Patient was seen in 3/18 with recurrent NYHA class III symptoms and volume overload.  He was noted to be in atrial fibrillation.  He was restarted on his cardiac meds and Lasix was increased.  Pt underwent DCCV 12/07/16 with resumption of NSR.     He remains in NSR today.  Symptomatically, he is much improved.  Weight is down another 3 lbs.  He feels back to baseline.  No exertional dyspnea.  He has been walking for exercise and doing yardwork.  No orthopnea/PND.  No lightheadedness or syncope. He does not feel palpitations though has history of frequent PVCs.   Review of systems complete and found to be negative unless listed in HPI.    Labs (10/17): K 4.1, creatinine 1.38 => 1.33, hgb 14.4, BNP 927 Labs (11/17): K 4.2, creatinine 1.22, BNP 447,  digoxin 0.3, SPEP negative.  Labs (12/17): K 4.5, creatinine 1.19, BNP 311 Labs (4/18): K 4.8, creatinine 1.25, BNP 369, hgb 15  ECG (personally reviewed): NSR, PVC and PACs  PMH: 1. H/o inguinal hernia repair 3/17.  2. HTN 3. Erectile dysfunction 4. Chronic systolic CHF: Nonischemic cardiomyopathy.  - Echo (10/17): EF 20-25%, increased apical trabeculations possibly consistent with noncompaction, diffuse hypokinesis, mildly dilated RV, moderate to severe MR.  - RHC/LHC (10/17): 20% distal LM, mean RA 1, PA 40/18, mean PCWP 18, CI 1.9, PVR 2.5 WU. - TEE (3/18): EF 25%, mild to moderately decreased RV systolic function.  5. PVCs: Frequent.  6. Atrial fibrillation: Noted for the first time in 3/18. DCCV to NSR 3/18.   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  Quit smoking 23 years ago.  Ultra-marathoner until 3 years ago.   FH: No heart problems that he knows of.   Current Outpatient Prescriptions  Medication Sig Dispense Refill  . apixaban (ELIQUIS) 5 MG TABS tablet Take 1 tablet (5 mg total) by mouth 2 (two) times daily. 60 tablet 6  . carvedilol (COREG) 6.25 MG tablet Take 0.5 tablets (3.125 mg total) by mouth 2 (two) times daily with a meal. 60 tablet 6  . digoxin (LANOXIN) 0.125 MG tablet Take 1 tablet (0.125 mg total) by mouth daily. 30 tablet 3  .  furosemide (LASIX) 20 MG tablet Take 2 tablets (40 mg total) by mouth daily. 60 tablet 6  . sacubitril-valsartan (ENTRESTO) 24-26 MG Take 1 tablet by mouth 2 (two) times daily. 60 tablet 3  . spironolactone (ALDACTONE) 25 MG tablet Take 1 tablet (25 mg total) by mouth daily. 30 tablet 6   No current facility-administered medications for this encounter.    BP (!) 140/92 (BP Location: Right Arm, Patient Position: Sitting, Cuff Size: Normal)   Pulse 68   Wt 191 lb (86.6 kg)   SpO2 100%   BMI 27.80 kg/m    Wt Readings from Last 3 Encounters:  12/20/16 191  lb (86.6 kg)  12/13/16 194 lb (88 kg)  12/07/16 209 lb (94.8 kg)     General: NAD  Neck: Supple. JVP not elevated. Carotids 2+ bilat; no bruits. No thyromegaly or nodule noted.    Lungs: Clear to auscultation bilaterally.   CV: PMI non displaced. Regular rate and rhythm with occasional ectopy.  No ankle edema. Normal pedal pulses. No carotid bruit.  Abdomen: soft, NT, ND, no HSM. No bruits or masses. +BS  Skin: Intact without lesions or rashes.  Neurologic: Alert & oriented x 3. Cranial nerves grossly intact. Moves all 4 extremities w/o difficulty.  Psych: Normal affect Extremities: No clubbing or cyanosis. No edema as above.   HEENT: Normal.  Assessment/Plan: 1. Chronic systolic CHF: Nonischemic cardiomyopathy. 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  Etiology of his CHF is not clear => no definite inciting event.  He has a history of HTN but doubt this was the only trigger.  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  This would ideally be confirmed by cMRI, but he has not wanted an MRI (concerned about side effects).  He also has frequent PVCs, but these have not yet been quantified. No family history of CMP.  Cannot rule out viral myocarditis. SPEP negative.  With medical management, he initially felt a lot better.  However, he quit all his meds in early 2018 with recurrence of NYHA III symptoms and onset of atrial fibrillation.  He is now back on his meds with resolution of volume overload.  He has had DCCV and is in NSR. TEE in 3/18 showed that EF remains 25%.  Today, NYHA class II symptoms without volume overload.  - He can decrease Lasix back to 40 mg daily.  - Continue current Coreg and Entresto.  BMET today.  - Continue digoxin 0.125 mg daily. Check level today.  - Start spironolactone 25 mg daily and stop KCl.  BMET 10 days.   - Ideally needs cMRI to r/o infiltrative disease/myocarditis and to further  investigate for LV non-complaction.  He has not wanted an MRI but will ask him again when he is due for a followup EF evaluation => would like to reassess LV function in about 6 months (with medication compliance).  If EF is low at that point, would recommend ICD.  QRS not wide enough for CRT benefit.  2. PVCs: History of frequent PVCs (could these contribute to cardiomyopathy?) - 48 hour holter to quantify.    3. HTN: BP elevated.  Adding spironolactone.   4. Atrial fibrillation: Paroxysmal.  Back in NSR after 3/18 DCCV.    - Continue Eliquis 5 mg bid. No bleeding.   Followup in 7-10 days.  I strongly encouraged ongoing medication compliance.   Bryan Harmon 12/21/2016

## 2016-12-27 ENCOUNTER — Ambulatory Visit (HOSPITAL_COMMUNITY)
Admission: RE | Admit: 2016-12-27 | Discharge: 2016-12-27 | Disposition: A | Discharge status: Home / Self Care | Payer: BLUE CROSS/BLUE SHIELD | Source: Ambulatory Visit | Attending: Internal Medicine | Admitting: Internal Medicine

## 2016-12-27 ENCOUNTER — Encounter (HOSPITAL_COMMUNITY): Payer: Self-pay

## 2016-12-27 VITALS — BP 108/58 | HR 38 | Wt 190.4 lb

## 2016-12-27 DIAGNOSIS — I517 Cardiomegaly: Secondary | ICD-10-CM | POA: Insufficient documentation

## 2016-12-27 DIAGNOSIS — I493 Ventricular premature depolarization: Secondary | ICD-10-CM | POA: Diagnosis not present

## 2016-12-27 DIAGNOSIS — I5022 Chronic systolic (congestive) heart failure: Secondary | ICD-10-CM | POA: Diagnosis not present

## 2016-12-27 DIAGNOSIS — Z87891 Personal history of nicotine dependence: Secondary | ICD-10-CM | POA: Insufficient documentation

## 2016-12-27 DIAGNOSIS — I110 Hypertensive heart disease with heart failure: Secondary | ICD-10-CM | POA: Diagnosis not present

## 2016-12-27 DIAGNOSIS — I480 Paroxysmal atrial fibrillation: Secondary | ICD-10-CM | POA: Insufficient documentation

## 2016-12-27 DIAGNOSIS — Z7901 Long term (current) use of anticoagulants: Secondary | ICD-10-CM | POA: Diagnosis not present

## 2016-12-27 DIAGNOSIS — I10 Essential (primary) hypertension: Secondary | ICD-10-CM

## 2016-12-27 DIAGNOSIS — N529 Male erectile dysfunction, unspecified: Secondary | ICD-10-CM | POA: Insufficient documentation

## 2016-12-27 NOTE — Progress Notes (Signed)
Advanced Heart Failure Clinic Note   PCP: Dr. 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 male with paroxysmal A fib s/p DCCV 12/07/16 on Eliquis, Chronic systolic CHF from unclear entiology, but thought to be NICM, and HTN.   He used to be an Museum/gallery curator until about 2014, he stopped due to symptomatic inguinal hernias.  In 3/17, he had bilateral inguinal hernia repairs. Around 6/17, he began to develop exertional dyspnea.  There was no inciting event.  This became gradually progressive.  He took a trip to [place] in 9/17 and had a really hard time walking around and keeping up.  He noted dyspnea walking up stairs and hills.  No chest pain or pressure.  He had frequent PVCs by ECGs but does not feel palpitations.  No lightheadedness or syncope. I had him get an echocardiogram.  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  He then had LHC/RHC done, showing decreased cardiac output and no significant CAD. He was started on cardiac meds and Lasix.  He felt much better with medical treatment.  His functional status returned to normal and he was doing some jogging as well.  Encouraged to restart his meds in 09/2016, but pt wished to remain off.   Patient was seen in 3/18 with recurrent NYHA class III symptoms and volume overload.  He was noted to be in atrial fibrillation.  He was restarted on his cardiac meds and Lasix was increased.  Pt underwent DCCV 12/07/16 with resumption of NSR.     He presents today for regular follow up. Last visit started on spironolactone and lasix backed down. Pt concerned over his BP. Runs in 100-110s. He is asymptomatic.  Denies lightheadedness or dizziness.  He is not SOB with walking on flat ground or doing yard work.  No orthopnea/PND. He does not feel his PVCs, no CP. Taking all medication as directed. He is frustrated his Holter has not been completed yet.  He is going to [facility] for a month on May 3.   Review of  systems complete and found to be negative unless listed in HPI.    Labs (10/17): K 4.1, creatinine 1.38 => 1.33, hgb 14.4, BNP 927 Labs (11/17): K 4.2, creatinine 1.22, BNP 447, digoxin 0.3, SPEP negative.  Labs (12/17): K 4.5, creatinine 1.19, BNP 311 Labs (4/18): K 4.8, creatinine 1.25, BNP 369, hgb 15  PMH: 1. H/o inguinal hernia repair 3/17.  2. HTN 3. Erectile dysfunction 4. Chronic systolic CHF: Nonischemic cardiomyopathy.  - Echo (10/17): EF 20-25%, increased apical trabeculations possibly consistent with noncompaction, diffuse hypokinesis, mildly dilated RV, moderate to severe MR.  - RHC/LHC (10/17): 20% distal LM, mean RA 1, PA 40/18, mean PCWP 18, CI 1.9, PVR 2.5 WU. - TEE (3/18): EF 25%, mild to moderately decreased RV systolic function.  5. PVCs: Frequent.  6. Atrial fibrillation: Noted for the first time in 3/18. DCCV to NSR 3/18.   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  Quit smoking 23 years ago.  Ultra-marathoner until 3 years ago.   FH: No heart problems that he knows of.   Current Outpatient Prescriptions  Medication Sig Dispense Refill  . apixaban (ELIQUIS) 5 MG TABS tablet Take 1 tablet (5 mg total) by mouth 2 (two) times daily. 60 tablet 6  . carvedilol (COREG) 6.25 MG tablet Take 0.5 tablets (3.125 mg total) by mouth 2 (two) times daily  with a meal. 60 tablet 6  . digoxin (LANOXIN) 0.125 MG tablet Take 1 tablet (0.125 mg total) by mouth daily. 30 tablet 3  . furosemide (LASIX) 20 MG tablet Take 2 tablets (40 mg total) by mouth daily. 60 tablet 6  . sacubitril-valsartan (ENTRESTO) 24-26 MG Take 1 tablet by mouth 2 (two) times daily. 60 tablet 3  . spironolactone (ALDACTONE) 25 MG tablet Take 1 tablet (25 mg total) by mouth daily. 30 tablet 6   No current facility-administered medications for this encounter.    BP (!) 108/58 (BP Location: Left Arm, Patient Position: Sitting, Cuff Size:  Normal)   Pulse (!) 38   Wt 190 lb 6.4 oz (86.4 kg)   BMI 27.71 kg/m    Both EKGs reviewed personally by Graciella Freer on this day, 12/27/16  EKG NSR 64 bpm EKG #2 NSR with frequent PVCs (Bigeminy)   Wt Readings from Last 3 Encounters:  12/27/16 190 lb 6.4 oz (86.4 kg)  12/20/16 191 lb (86.6 kg)  12/13/16 194 lb (88 kg)   General: Well appearing. No resp difficulty. HEENT: Normal Neck: Supple. JVD 5-6. Carotids 2+ bilat; no bruits. No thyromegaly or nodule noted. Cor: PMI nondisplaced. RRR, No M/G/R noted Lungs: Clear, normal effort.  Abdomen: soft, non-tender, distended, no HSM. No bruits or masses. +BS  Extremities: no cyanosis, clubbing, rash, R and LLE no edema.  Neuro: alert & orientedx3, cranial nerves grossly intact. moves all 4 extremities w/o difficulty. Affect pleasant   Assessment/Plan: 1. Chronic systolic CHF: Nonischemic cardiomyopathy. 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  Etiology of his CHF is not clear => no definite inciting event.  He has a history of HTN but doubt this was the only trigger.  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  This would ideally be confirmed by cMRI, but he has not wanted an MRI (concerned about side effects).  He also has frequent PVCs, but these have not yet been quantified. No family history of CMP.  Cannot rule out viral myocarditis. SPEP negative.  With medical management, he initially felt a lot better.  However, he quit all his meds in early 2018 with recurrence of NYHA III symptoms and onset of atrial fibrillation.  He is now back on his meds with resolution of volume overload.  He has had DCCV and is in NSR. TEE in 3/18 showed that EF remains 25%.   - NYHA class II symptoms. Volume status looks stable on lasix 40 mg daily.  - Continue coreg 3.125 mg BID - Continue Entresto 24-26 mg BID.  - Continue digoxin 0.125 mg daily.  - He can decrease  Lasix back to 40 mg daily.  - Continue current Coreg and Entresto.  BMET today.  - Continue digoxin 0.125 mg daily. Level 1.0 on 12/20/16 but not trough so no changes made.  - Continue spironolactone 25 mg daily.  - Ideally needs cMRI to r/o infiltrative disease/myocarditis and to further investigate for LV non-complaction.  He has not wanted an MRI but will ask him again when he is due for a followup EF evaluation => would like to reassess LV function in about 6 months (with medication compliance).  If EF is low at that point, would recommend ICD.  QRS not wide enough for CRT benefit. No change.  2. PVCs: History of frequent PVCs (could these contribute to cardiomyopathy?) - 48hr holter pending.  3. HTN:  - Stable on current  meds. Refuses up-titration.  4. Atrial fibrillation: Paroxysmal.  Back in NSR after 3/18 DCCV.    - Continue Eliquis 5 mg BID.    Graciella Freer, PA-C  12/27/2016

## 2016-12-27 NOTE — Patient Instructions (Signed)
No changes to medication at this time.  No lab work today.  Follow up 6-8 weeks with Dr. [name].  Do the following things EVERYDAY: 1) Weigh yourself in the morning before breakfast. Write it down and keep it in a log. 2) Take your medicines as prescribed 3) Eat low salt foods-Limit salt (sodium) to 2000 mg per day.  4) Stay as active as you can everyday 5) Limit all fluids for the day to less than 2 liters

## 2016-12-28 ENCOUNTER — Other Ambulatory Visit: Payer: Self-pay | Admitting: Cardiology

## 2017-02-08 NOTE — Addendum Note (Signed)
Addendum  created 02/08/17 1311 by Clotine Heiner, MD   Sign clinical note    

## 2017-02-11 ENCOUNTER — Encounter (HOSPITAL_COMMUNITY): Payer: Self-pay

## 2017-02-11 ENCOUNTER — Ambulatory Visit (HOSPITAL_COMMUNITY)
Admission: RE | Admit: 2017-02-11 | Discharge: 2017-02-11 | Disposition: A | Discharge status: Home / Self Care | Payer: Medicare Other | Source: Ambulatory Visit | Attending: Cardiology | Admitting: Cardiology

## 2017-02-11 VITALS — BP 158/116 | HR 62 | Wt 188.5 lb

## 2017-02-11 DIAGNOSIS — I4891 Unspecified atrial fibrillation: Secondary | ICD-10-CM | POA: Diagnosis not present

## 2017-02-11 DIAGNOSIS — I493 Ventricular premature depolarization: Secondary | ICD-10-CM | POA: Diagnosis not present

## 2017-02-11 DIAGNOSIS — I5022 Chronic systolic (congestive) heart failure: Secondary | ICD-10-CM | POA: Diagnosis not present

## 2017-02-11 DIAGNOSIS — N529 Male erectile dysfunction, unspecified: Secondary | ICD-10-CM | POA: Insufficient documentation

## 2017-02-11 DIAGNOSIS — Z87891 Personal history of nicotine dependence: Secondary | ICD-10-CM | POA: Diagnosis not present

## 2017-02-11 DIAGNOSIS — I110 Hypertensive heart disease with heart failure: Secondary | ICD-10-CM | POA: Insufficient documentation

## 2017-02-11 DIAGNOSIS — Z79899 Other long term (current) drug therapy: Secondary | ICD-10-CM | POA: Diagnosis not present

## 2017-02-11 DIAGNOSIS — I480 Paroxysmal atrial fibrillation: Secondary | ICD-10-CM | POA: Insufficient documentation

## 2017-02-11 DIAGNOSIS — I429 Cardiomyopathy, unspecified: Secondary | ICD-10-CM | POA: Diagnosis not present

## 2017-02-11 LAB — BASIC METABOLIC PANEL
ANION GAP: 9 (ref 5–15)
BUN: 13 mg/dL (ref 6–20)
CO2: 28 mmol/L (ref 22–32)
Calcium: 9.3 mg/dL (ref 8.9–10.3)
Chloride: 102 mmol/L (ref 101–111)
Creatinine, Ser: 1.08 mg/dL (ref 0.61–1.24)
GFR calc non Af Amer: >60 mL/min (ref >60)
Glucose, Bld: 108 mg/dL — ABNORMAL HIGH (ref 65–99)
Potassium: 4.1 mmol/L (ref 3.5–5.1)
SODIUM: 139 mmol/L (ref 135–145)

## 2017-02-11 LAB — CBC
HCT: 42.7 % (ref 39.0–52.0)
HEMOGLOBIN: 14.9 g/dL (ref 13.0–17.0)
MCH: 33.6 pg (ref 26.0–34.0)
MCHC: 34.9 g/dL (ref 30.0–36.0)
MCV: 96.2 fL (ref 78.0–100.0)
Platelets: 255 10*3/uL (ref 150–400)
RBC: 4.44 MIL/uL (ref 4.22–5.81)
RDW: 14 % (ref 11.5–15.5)
WBC: 8.3 10*3/uL (ref 4.0–10.5)

## 2017-02-11 LAB — BRAIN NATRIURETIC PEPTIDE: B NATRIURETIC PEPTIDE 5: 562.7 pg/mL — AB (ref 0.0–100.0)

## 2017-02-11 MED ORDER — FUROSEMIDE 20 MG PO TABS: 40.0000 mg | ORAL_TABLET | Freq: Every day | ORAL | 6 refills | Status: AC

## 2017-02-11 MED ORDER — APIXABAN 5 MG PO TABS: 5.0000 mg | ORAL_TABLET | Freq: Two times a day (BID) | ORAL | 6 refills | Status: AC

## 2017-02-11 MED ORDER — SACUBITRIL-VALSARTAN 24-26 MG PO TABS: 1.0000 | ORAL_TABLET | Freq: Two times a day (BID) | ORAL | 3 refills | Status: AC

## 2017-02-11 MED ORDER — CARVEDILOL 6.25 MG PO TABS: 3.1250 mg | ORAL_TABLET | Freq: Two times a day (BID) | ORAL | 6 refills | Status: AC

## 2017-02-11 NOTE — Patient Instructions (Addendum)
Stop Digoxin  PLEASE TAKE ALL MEDICATIONS AS DIRECTED:  ELIQUIS  Twice daily   ENTRESTO Twice daily   CARVEDILOL Twice daily   Labs today  Your physician has recommended that you wear a holter monitor. Holter monitors are medical devices that record the heart's electrical activity. Doctors most often use these monitors to diagnose arrhythmias. Arrhythmias are problems with the speed or rhythm of the heartbeat. The monitor is a small, portable device. You can wear one while you do your normal daily activities. This is usually used to diagnose what is causing palpitations/syncope (passing out).  Your physician recommends that you schedule a follow-up appointment in: September with an echocardiogram

## 2017-02-11 NOTE — Progress Notes (Signed)
Advanced Heart Failure Clinic Note   PCP: Dr. 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 male with paroxysmal A fib s/p DCCV 12/07/16 on Eliquis, Chronic systolic CHF from unclear entiology, but thought to be NICM, and HTN.   He used to be an Museum/gallery curator until about 2014, he stopped due to symptomatic inguinal hernias.  In 3/17, he had bilateral inguinal hernia repairs. Around 6/17, he began to develop exertional dyspnea.  There was no inciting event.  This became gradually progressive.  He took a trip to [place] in 9/17 and had a really hard time walking around and keeping up.  He noted dyspnea walking up stairs and hills.  No chest pain or pressure.  He had frequent PVCs by ECGs but does not feel palpitations.  No lightheadedness or syncope. I had him get an echocardiogram.  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  He then had LHC/RHC done, showing decreased cardiac output and no significant CAD. He was started on cardiac meds and Lasix.  He felt much better with medical treatment.  His functional status returned to normal and he was doing some jogging as well.  Encouraged to restart his meds in 09/2016, but pt wished to remain off.   Patient was seen in 3/18 with recurrent NYHA class III symptoms and volume overload.  He was noted to be in atrial fibrillation.  He was restarted on his cardiac meds and Lasix was increased.  Pt underwent DCCV 12/07/16 with resumption of NSR.   Holter monitor in 4/18 showed 21% PVCs.   He returns for followup today.  He is very reticent to take cardiac meds and wants to know when he can stop them.  He is currently taking Eliquis, Entresto, and Coreg only once a day.  He did this in order to try to wean himself off his meds.  He was recently in [place] taking care of grand-kids and had no problems.  No palpitations, he is in NSR today.  No orthopnea/PND. No significant exertional dyspnea or chest pain. Weight down  2 lbs.  BP is high today.   ECG: Personally reviewed, NSR, LVH with repolarization abnormality.   Review of systems complete and found to be negative unless listed in HPI.    Labs (10/17): K 4.1, creatinine 1.38 => 1.33, hgb 14.4, BNP 927 Labs (11/17): K 4.2, creatinine 1.22, BNP 447, digoxin 0.3, SPEP negative.  Labs (12/17): K 4.5, creatinine 1.19, BNP 311 Labs (4/18): K 4.8, creatinine 1.25 => 1.18, BNP 369, hgb 15, digoxin 1.0  PMH: 1. H/o inguinal hernia repair 3/17.  2. HTN 3. Erectile dysfunction 4. Chronic systolic CHF: Nonischemic cardiomyopathy. ?PVC mediated.  - Echo (10/17): EF 20-25%, increased apical trabeculations possibly consistent with noncompaction, diffuse hypokinesis, mildly dilated RV, moderate to severe MR.  - RHC/LHC (10/17): 20% distal LM, mean RA 1, PA 40/18, mean PCWP 18, CI 1.9, PVR 2.5 WU. - TEE (3/18): EF 25%, mild to moderately decreased RV systolic function.  5. PVCs: Frequent.  - Holter (4/18): 21% PVCs, 7 beat run NSVT, no atrial fibrillation.  6. Atrial fibrillation: Noted for the first time in 3/18. DCCV to NSR 3/18.   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  Quit smoking 23 years ago.  Ultra-marathoner until 3 years ago.   FH: No heart problems that he knows of.   Current Outpatient Prescriptions  Medication Sig Dispense  Refill  . apixaban (ELIQUIS) 5 MG TABS tablet Take 1 tablet (5 mg total) by mouth 2 (two) times daily. 60 tablet 6  . carvedilol (COREG) 6.25 MG tablet Take 0.5 tablets (3.125 mg total) by mouth 2 (two) times daily with a meal. 60 tablet 6  . furosemide (LASIX) 20 MG tablet Take 2 tablets (40 mg total) by mouth daily. 60 tablet 6  . sacubitril-valsartan (ENTRESTO) 24-26 MG Take 1 tablet by mouth 2 (two) times daily. 60 tablet 3  . spironolactone (ALDACTONE) 25 MG tablet Take 1 tablet (25 mg total) by mouth daily. 30 tablet 6   No current  facility-administered medications for this encounter.    BP (!) 158/116   Pulse 62   Wt 188 lb 8 oz (85.5 kg)   SpO2 99%   BMI 27.44 kg/m    Wt Readings from Last 3 Encounters:  02/11/17 188 lb 8 oz (85.5 kg)  12/27/16 190 lb 6.4 oz (86.4 kg)  12/20/16 191 lb (86.6 kg)   General: Well appearing. No resp difficulty. HEENT: Normal Neck: Supple. JVD 7. Carotids 2+ bilat; no bruits. No thyromegaly or nodule noted. Cor: PMI nondisplaced. RRR, 1/6 HSM apex.  Lungs: Clear, normal effort.  Abdomen: soft, non-tender, distended, no HSM. No bruits or masses. +BS  Extremities: no cyanosis, clubbing, rash, Trace edema.  Neuro: alert & orientedx3, cranial nerves grossly intact. moves all 4 extremities w/o difficulty. Affect pleasant   Assessment/Plan: 1. Chronic systolic CHF: Nonischemic cardiomyopathy. 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  Etiology of his CHF is not clear => no definite inciting event.  He has a history of HTN but doubt this was the only trigger.  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  This would ideally be confirmed by cMRI, but he has not wanted an MRI (concerned about side effects).  He also has frequent PVCs, 21% total on last holter in 4/18 which is a risk for fall in EF.   No family history of CMP.  Cannot rule out viral myocarditis. SPEP negative.  With medical management, he initially felt a lot better.  However, he quit all his meds in early 2018 with recurrence of NYHA III symptoms and onset of atrial fibrillation.  He has had DCCV and is in NSR. TEE in 3/18 showed that EF remains 25%.   He is again trying to cut back on his meds.  Symptoms remain minimal and he is not volume overloaded on exam. NYHA class II.  - Continue Lasix 40 mg daily.  BMET/BNP today.  - Increase Coreg and Entresto back to bid. I talked to him about the pharmacokinetics of his meds.  - He wants to stop something.  I  will let him try coming off digoxin.  He knows to restart it if he feels significantly worse.  - Continue spironolactone 25 mg daily.  - Ideally needs cMRI to r/o infiltrative disease/myocarditis and to further investigate for LV non-complaction.  He still refuses MRI.  I will repeat echo in 3 months.  If EF is low at that point, would recommend ICD though think he would be unlikely to agree to this.  QRS not wide enough for CRT benefit.  2. 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  It is possible that the PVCs contribute to his cardiomyopathy.  He does not want to start an antiarrhythmic or see EP.  - Increase Coreg to 3.125 mg bid.  -  Repeat Holter in 9/18.   3. HTN: He agrees to increase his Entresto and Coreg back to bid but does not want to titrate any higher.   4. Atrial fibrillation: Paroxysmal.  3/18 DCCV.   NSR today.  - Needs to take Eliquis bid rather than qd.  CBC today.   Compliance remains a significant issue.    Marca Ancona, MD  02/11/2017

## 2017-05-22 ENCOUNTER — Other Ambulatory Visit (HOSPITAL_COMMUNITY): Payer: Medicare Other

## 2017-07-25 ENCOUNTER — Other Ambulatory Visit (HOSPITAL_COMMUNITY): Payer: Medicare Other

## 2017-08-13 ENCOUNTER — Encounter: Payer: Self-pay | Admitting: Cardiology

## 2017-09-11 ENCOUNTER — Telehealth (HOSPITAL_COMMUNITY): Payer: Self-pay | Admitting: *Deleted

## 2017-09-11 NOTE — Telephone Encounter (Signed)
Per chart pt has cancelled holter and echo twice.  Will contact pt to sch f/u and echo w/Dr [name]

## 2017-09-11 NOTE — Telephone Encounter (Signed)
-----   Message from Royann Shivers sent at 09/09/2017  2:43 PM EST ----- Regarding: HOLTER MONITOR Hi.  This patient was supposed to get a Holter Monitor.  The appointment has been scheduled twice and he has canceled each one.  A letter was sent to patient the first week of December to remind them to schedule. Order was canceled per Glenna C.

## 2017-09-12 ENCOUNTER — Telehealth (HOSPITAL_COMMUNITY): Payer: Self-pay | Admitting: Cardiology

## 2017-09-12 NOTE — Telephone Encounter (Signed)
I called and spoke with pt.  He said he is on the [facility] right now, that is why all his appts have been cancelled. I offered to go ahead and schedule his appt for f/u with Dr. [name] and Echo even in March; pt declined.  He said he wanted to see a Pulmonologist before he comes back to see Dr. [name]. Patient stated he would call after he returns to [facility] and has seen a pulmonary dr. Haematologist message was sent to Meredith Staggers, RN.

## 2017-09-12 NOTE — Telephone Encounter (Signed)
Called patient to schedule him to see Dr. [name] with an Echo for next available appt per staff message from Meredith Staggers, RN.  Patient's VM is full and I am unable to leave a message.  Will try to reach patient at a later time.

## 2017-09-23 ENCOUNTER — Ambulatory Visit: Payer: Self-pay

## 2017-09-23 NOTE — Telephone Encounter (Signed)
rec'd phone call from pt.; reported he is in [place].  Requested to have an antibiotic called in by Dr. Neva Seat "for the stomach flu."  Reported he started having diarrhea about 10 days ago.  Reported his symptoms progressed to cough, fever, and lack of energy.  Reported he has had fever of 101-102.  Stated he does not have fever today.  Stated his cough is persistent and is coughing up a "clear phlegm, when I see it."  Has been using "Licorice and Bronchial Wellness Syrup over the counter."  Advised with his symptoms, the protocol recommends to see MD within 24 hrs.  The pt. stated "I am not interested in any protocol; I just want advice on an antibiotic I can take OTC?"  Advised that an oral antibiotic cannot be obtained without a prescription.  Care Advice given per protocol.  Encouraged the pt. to seek evaluation at an UC, in [place].  Verb. Understanding.      Reason for Disposition . Fever present > 3 days (72 hours)  Answer Assessment - Initial Assessment Questions 1. WORST SYMPTOM: "What is your worst symptom?" (e.g., cough, runny nose, muscle aches, headache, sore throat, fever)      Diarrhea was the 1st symptom; then progressed to cough, fever, lack of energy   2. ONSET: "When did your flu symptoms start?"      About 10 days ago 3. COUGH: "How bad is the cough?"       persistent cough 4. RESPIRATORY DISTRESS: "Describe your breathing."      Some shortness of breath  5. FEVER: "Do you have a fever?" If so, ask: "What is your temperature, how was it measured, and when did it start?"     101-102 degrees, usually at night;  Today the temp. Is normal 6. EXPOSURE: "Were you exposed to someone with influenza?"      Was exposed to the flu 7. FLU VACCINE: "Did you get a flu shot this year?"     No  8. HIGH RISK DISEASE: "Do you any chronic medical problems?" (e.g., heart or lung disease, asthma, weak immune system, or other HIGH RISK conditions)     None of the above 9.  PREGNANCY: "Is there any chance you are pregnant?" "When was your last menstrual period?"     N/a  10. OTHER SYMPTOMS: "Do you have any other symptoms?"  (e.g., runny nose, muscle aches, headache, sore throat)       Cough, fever, diarrhea, muscle aches, headache minor, lack of energy  Protocols used: INFLUENZA - SEASONAL-A-AH

## 2017-09-30 ENCOUNTER — Other Ambulatory Visit: Payer: Self-pay

## 2017-09-30 ENCOUNTER — Inpatient Hospital Stay (HOSPITAL_COMMUNITY)
Admission: EM | Admit: 2017-09-30 | Discharge: 2017-11-08 | DRG: 215 | Disposition: E | Payer: Medicare Other | Attending: Cardiology | Admitting: Cardiology

## 2017-09-30 ENCOUNTER — Emergency Department (HOSPITAL_COMMUNITY): Payer: Medicare Other

## 2017-09-30 ENCOUNTER — Telehealth (HOSPITAL_COMMUNITY): Payer: Self-pay | Admitting: *Deleted

## 2017-09-30 ENCOUNTER — Encounter (HOSPITAL_COMMUNITY): Payer: Self-pay | Admitting: Emergency Medicine

## 2017-09-30 ENCOUNTER — Ambulatory Visit (INDEPENDENT_AMBULATORY_CARE_PROVIDER_SITE_OTHER)
Admission: EM | Admit: 2017-09-30 | Discharge: 2017-09-30 | Disposition: A | Discharge status: Home / Self Care | Payer: Medicare Other | Source: Home / Self Care | Attending: Internal Medicine | Admitting: Internal Medicine

## 2017-09-30 DIAGNOSIS — R739 Hyperglycemia, unspecified: Secondary | ICD-10-CM | POA: Diagnosis not present

## 2017-09-30 DIAGNOSIS — D696 Thrombocytopenia, unspecified: Secondary | ICD-10-CM | POA: Diagnosis present

## 2017-09-30 DIAGNOSIS — N179 Acute kidney failure, unspecified: Secondary | ICD-10-CM | POA: Diagnosis present

## 2017-09-30 DIAGNOSIS — Z9289 Personal history of other medical treatment: Secondary | ICD-10-CM

## 2017-09-30 DIAGNOSIS — Z885 Allergy status to narcotic agent status: Secondary | ICD-10-CM | POA: Diagnosis not present

## 2017-09-30 DIAGNOSIS — Z23 Encounter for immunization: Secondary | ICD-10-CM

## 2017-09-30 DIAGNOSIS — I272 Pulmonary hypertension, unspecified: Secondary | ICD-10-CM | POA: Diagnosis present

## 2017-09-30 DIAGNOSIS — E440 Moderate protein-calorie malnutrition: Secondary | ICD-10-CM | POA: Diagnosis not present

## 2017-09-30 DIAGNOSIS — R5381 Other malaise: Secondary | ICD-10-CM | POA: Diagnosis not present

## 2017-09-30 DIAGNOSIS — E876 Hypokalemia: Secondary | ICD-10-CM | POA: Diagnosis not present

## 2017-09-30 DIAGNOSIS — Z8701 Personal history of pneumonia (recurrent): Secondary | ICD-10-CM | POA: Diagnosis not present

## 2017-09-30 DIAGNOSIS — R579 Shock, unspecified: Secondary | ICD-10-CM

## 2017-09-30 DIAGNOSIS — B9689 Other specified bacterial agents as the cause of diseases classified elsewhere: Secondary | ICD-10-CM | POA: Diagnosis not present

## 2017-09-30 DIAGNOSIS — R570 Cardiogenic shock: Secondary | ICD-10-CM | POA: Diagnosis present

## 2017-09-30 DIAGNOSIS — I330 Acute and subacute infective endocarditis: Secondary | ICD-10-CM | POA: Diagnosis present

## 2017-09-30 DIAGNOSIS — J939 Pneumothorax, unspecified: Secondary | ICD-10-CM

## 2017-09-30 DIAGNOSIS — T465X6A Underdosing of other antihypertensive drugs, initial encounter: Secondary | ICD-10-CM | POA: Diagnosis present

## 2017-09-30 DIAGNOSIS — Y838 Other surgical procedures as the cause of abnormal reaction of the patient, or of later complication, without mention of misadventure at the time of the procedure: Secondary | ICD-10-CM | POA: Diagnosis not present

## 2017-09-30 DIAGNOSIS — R7881 Bacteremia: Secondary | ICD-10-CM | POA: Diagnosis present

## 2017-09-30 DIAGNOSIS — Z9689 Presence of other specified functional implants: Secondary | ICD-10-CM | POA: Diagnosis not present

## 2017-09-30 DIAGNOSIS — I5023 Acute on chronic systolic (congestive) heart failure: Secondary | ICD-10-CM | POA: Diagnosis present

## 2017-09-30 DIAGNOSIS — T80219A Unspecified infection due to central venous catheter, initial encounter: Secondary | ICD-10-CM

## 2017-09-30 DIAGNOSIS — E872 Acidosis: Secondary | ICD-10-CM | POA: Diagnosis present

## 2017-09-30 DIAGNOSIS — I081 Rheumatic disorders of both mitral and tricuspid valves: Secondary | ICD-10-CM | POA: Diagnosis present

## 2017-09-30 DIAGNOSIS — Z66 Do not resuscitate: Secondary | ICD-10-CM | POA: Diagnosis not present

## 2017-09-30 DIAGNOSIS — R509 Fever, unspecified: Secondary | ICD-10-CM | POA: Diagnosis not present

## 2017-09-30 DIAGNOSIS — F419 Anxiety disorder, unspecified: Secondary | ICD-10-CM | POA: Diagnosis present

## 2017-09-30 DIAGNOSIS — J189 Pneumonia, unspecified organism: Secondary | ICD-10-CM | POA: Diagnosis not present

## 2017-09-30 DIAGNOSIS — E870 Hyperosmolality and hypernatremia: Secondary | ICD-10-CM | POA: Diagnosis present

## 2017-09-30 DIAGNOSIS — L89159 Pressure ulcer of sacral region, unspecified stage: Secondary | ICD-10-CM | POA: Diagnosis not present

## 2017-09-30 DIAGNOSIS — I472 Ventricular tachycardia: Secondary | ICD-10-CM | POA: Diagnosis present

## 2017-09-30 DIAGNOSIS — Z930 Tracheostomy status: Secondary | ICD-10-CM | POA: Diagnosis not present

## 2017-09-30 DIAGNOSIS — I481 Persistent atrial fibrillation: Secondary | ICD-10-CM | POA: Diagnosis present

## 2017-09-30 DIAGNOSIS — R011 Cardiac murmur, unspecified: Secondary | ICD-10-CM | POA: Diagnosis not present

## 2017-09-30 DIAGNOSIS — J9601 Acute respiratory failure with hypoxia: Secondary | ICD-10-CM | POA: Diagnosis not present

## 2017-09-30 DIAGNOSIS — R000 Tachycardia, unspecified: Secondary | ICD-10-CM | POA: Diagnosis not present

## 2017-09-30 DIAGNOSIS — T82598A Other mechanical complication of other cardiac and vascular devices and implants, initial encounter: Secondary | ICD-10-CM | POA: Diagnosis present

## 2017-09-30 DIAGNOSIS — I4891 Unspecified atrial fibrillation: Secondary | ICD-10-CM

## 2017-09-30 DIAGNOSIS — J181 Lobar pneumonia, unspecified organism: Secondary | ICD-10-CM

## 2017-09-30 DIAGNOSIS — J13 Pneumonia due to Streptococcus pneumoniae: Secondary | ICD-10-CM | POA: Diagnosis present

## 2017-09-30 DIAGNOSIS — Z87891 Personal history of nicotine dependence: Secondary | ICD-10-CM

## 2017-09-30 DIAGNOSIS — Z7189 Other specified counseling: Secondary | ICD-10-CM

## 2017-09-30 DIAGNOSIS — R0600 Dyspnea, unspecified: Secondary | ICD-10-CM | POA: Diagnosis not present

## 2017-09-30 DIAGNOSIS — J942 Hemothorax: Secondary | ICD-10-CM | POA: Diagnosis not present

## 2017-09-30 DIAGNOSIS — R9401 Abnormal electroencephalogram [EEG]: Secondary | ICD-10-CM | POA: Diagnosis not present

## 2017-09-30 DIAGNOSIS — Z515 Encounter for palliative care: Secondary | ICD-10-CM | POA: Diagnosis not present

## 2017-09-30 DIAGNOSIS — L8990 Pressure ulcer of unspecified site, unspecified stage: Secondary | ICD-10-CM | POA: Diagnosis not present

## 2017-09-30 DIAGNOSIS — N170 Acute kidney failure with tubular necrosis: Secondary | ICD-10-CM | POA: Diagnosis present

## 2017-09-30 DIAGNOSIS — B351 Tinea unguium: Secondary | ICD-10-CM | POA: Diagnosis present

## 2017-09-30 DIAGNOSIS — T82538A Leakage of other cardiac and vascular devices and implants, initial encounter: Secondary | ICD-10-CM

## 2017-09-30 DIAGNOSIS — R4189 Other symptoms and signs involving cognitive functions and awareness: Secondary | ICD-10-CM

## 2017-09-30 DIAGNOSIS — R51 Headache: Secondary | ICD-10-CM | POA: Diagnosis not present

## 2017-09-30 DIAGNOSIS — J9600 Acute respiratory failure, unspecified whether with hypoxia or hypercapnia: Secondary | ICD-10-CM | POA: Diagnosis not present

## 2017-09-30 DIAGNOSIS — J869 Pyothorax without fistula: Secondary | ICD-10-CM

## 2017-09-30 DIAGNOSIS — Z978 Presence of other specified devices: Secondary | ICD-10-CM | POA: Diagnosis not present

## 2017-09-30 DIAGNOSIS — J9690 Respiratory failure, unspecified, unspecified whether with hypoxia or hypercapnia: Secondary | ICD-10-CM

## 2017-09-30 DIAGNOSIS — I43 Cardiomyopathy in diseases classified elsewhere: Secondary | ICD-10-CM | POA: Diagnosis present

## 2017-09-30 DIAGNOSIS — R238 Other skin changes: Secondary | ICD-10-CM | POA: Diagnosis not present

## 2017-09-30 DIAGNOSIS — I509 Heart failure, unspecified: Secondary | ICD-10-CM

## 2017-09-30 DIAGNOSIS — I38 Endocarditis, valve unspecified: Secondary | ICD-10-CM | POA: Diagnosis not present

## 2017-09-30 DIAGNOSIS — I110 Hypertensive heart disease with heart failure: Principal | ICD-10-CM | POA: Diagnosis present

## 2017-09-30 DIAGNOSIS — Z01818 Encounter for other preprocedural examination: Secondary | ICD-10-CM

## 2017-09-30 DIAGNOSIS — R601 Generalized edema: Secondary | ICD-10-CM | POA: Diagnosis not present

## 2017-09-30 DIAGNOSIS — E875 Hyperkalemia: Secondary | ICD-10-CM | POA: Diagnosis present

## 2017-09-30 DIAGNOSIS — Z006 Encounter for examination for normal comparison and control in clinical research program: Secondary | ICD-10-CM

## 2017-09-30 DIAGNOSIS — I340 Nonrheumatic mitral (valve) insufficiency: Secondary | ICD-10-CM | POA: Diagnosis not present

## 2017-09-30 DIAGNOSIS — R6521 Severe sepsis with septic shock: Secondary | ICD-10-CM | POA: Diagnosis not present

## 2017-09-30 DIAGNOSIS — J850 Gangrene and necrosis of lung: Secondary | ICD-10-CM | POA: Diagnosis present

## 2017-09-30 DIAGNOSIS — J810 Acute pulmonary edema: Secondary | ICD-10-CM | POA: Diagnosis not present

## 2017-09-30 DIAGNOSIS — R58 Hemorrhage, not elsewhere classified: Secondary | ICD-10-CM | POA: Diagnosis not present

## 2017-09-30 DIAGNOSIS — J122 Parainfluenza virus pneumonia: Secondary | ICD-10-CM | POA: Diagnosis present

## 2017-09-30 DIAGNOSIS — Z9889 Other specified postprocedural states: Secondary | ICD-10-CM | POA: Diagnosis not present

## 2017-09-30 DIAGNOSIS — I255 Ischemic cardiomyopathy: Secondary | ICD-10-CM | POA: Diagnosis present

## 2017-09-30 DIAGNOSIS — I482 Chronic atrial fibrillation: Secondary | ICD-10-CM | POA: Diagnosis not present

## 2017-09-30 DIAGNOSIS — Z91128 Patient's intentional underdosing of medication regimen for other reason: Secondary | ICD-10-CM

## 2017-09-30 DIAGNOSIS — T45516A Underdosing of anticoagulants, initial encounter: Secondary | ICD-10-CM | POA: Diagnosis present

## 2017-09-30 DIAGNOSIS — E871 Hypo-osmolality and hyponatremia: Secondary | ICD-10-CM | POA: Diagnosis not present

## 2017-09-30 DIAGNOSIS — A419 Sepsis, unspecified organism: Secondary | ICD-10-CM | POA: Diagnosis not present

## 2017-09-30 DIAGNOSIS — M7989 Other specified soft tissue disorders: Secondary | ICD-10-CM | POA: Diagnosis not present

## 2017-09-30 DIAGNOSIS — Z95811 Presence of heart assist device: Secondary | ICD-10-CM | POA: Diagnosis not present

## 2017-09-30 DIAGNOSIS — I240 Acute coronary thrombosis not resulting in myocardial infarction: Secondary | ICD-10-CM | POA: Diagnosis present

## 2017-09-30 DIAGNOSIS — Z9911 Dependence on respirator [ventilator] status: Secondary | ICD-10-CM | POA: Diagnosis not present

## 2017-09-30 DIAGNOSIS — B9562 Methicillin resistant Staphylococcus aureus infection as the cause of diseases classified elsewhere: Secondary | ICD-10-CM | POA: Diagnosis not present

## 2017-09-30 DIAGNOSIS — R0689 Other abnormalities of breathing: Secondary | ICD-10-CM

## 2017-09-30 DIAGNOSIS — I5020 Unspecified systolic (congestive) heart failure: Secondary | ICD-10-CM | POA: Diagnosis not present

## 2017-09-30 DIAGNOSIS — Z95828 Presence of other vascular implants and grafts: Secondary | ICD-10-CM | POA: Diagnosis not present

## 2017-09-30 DIAGNOSIS — T500X6A Underdosing of mineralocorticoids and their antagonists, initial encounter: Secondary | ICD-10-CM | POA: Diagnosis present

## 2017-09-30 DIAGNOSIS — J90 Pleural effusion, not elsewhere classified: Secondary | ICD-10-CM | POA: Diagnosis not present

## 2017-09-30 DIAGNOSIS — Z4659 Encounter for fitting and adjustment of other gastrointestinal appliance and device: Secondary | ICD-10-CM

## 2017-09-30 DIAGNOSIS — R6511 Systemic inflammatory response syndrome (SIRS) of non-infectious origin with acute organ dysfunction: Secondary | ICD-10-CM | POA: Diagnosis not present

## 2017-09-30 DIAGNOSIS — I493 Ventricular premature depolarization: Secondary | ICD-10-CM | POA: Diagnosis present

## 2017-09-30 DIAGNOSIS — Z8619 Personal history of other infectious and parasitic diseases: Secondary | ICD-10-CM | POA: Diagnosis not present

## 2017-09-30 DIAGNOSIS — I5082 Biventricular heart failure: Secondary | ICD-10-CM | POA: Diagnosis present

## 2017-09-30 DIAGNOSIS — Z933 Colostomy status: Secondary | ICD-10-CM | POA: Diagnosis not present

## 2017-09-30 DIAGNOSIS — R569 Unspecified convulsions: Secondary | ICD-10-CM | POA: Diagnosis not present

## 2017-09-30 DIAGNOSIS — Z8249 Family history of ischemic heart disease and other diseases of the circulatory system: Secondary | ICD-10-CM | POA: Diagnosis not present

## 2017-09-30 DIAGNOSIS — M109 Gout, unspecified: Secondary | ICD-10-CM | POA: Diagnosis not present

## 2017-09-30 DIAGNOSIS — Z452 Encounter for adjustment and management of vascular access device: Secondary | ICD-10-CM | POA: Diagnosis not present

## 2017-09-30 DIAGNOSIS — T447X6A Underdosing of beta-adrenoreceptor antagonists, initial encounter: Secondary | ICD-10-CM | POA: Diagnosis present

## 2017-09-30 LAB — I-STAT CG4 LACTIC ACID, ED: Lactic Acid, Venous: 5.74 mmol/L (ref 0.5–1.9)

## 2017-09-30 LAB — CBC
HCT: 43.1 % (ref 39.0–52.0)
Hemoglobin: 14.9 g/dL (ref 13.0–17.0)
MCH: 33.5 pg (ref 26.0–34.0)
MCHC: 34.6 g/dL (ref 30.0–36.0)
MCV: 96.9 fL (ref 78.0–100.0)
Platelets: 472 10*3/uL — ABNORMAL HIGH (ref 150–400)
RBC: 4.45 MIL/uL (ref 4.22–5.81)
RDW: 14.1 % (ref 11.5–15.5)
WBC: 17.6 10*3/uL — AB (ref 4.0–10.5)

## 2017-09-30 LAB — RESPIRATORY PANEL BY PCR
Adenovirus: NOT DETECTED
Bordetella pertussis: NOT DETECTED
CHLAMYDOPHILA PNEUMONIAE-RVPPCR: NOT DETECTED
CORONAVIRUS NL63-RVPPCR: NOT DETECTED
Coronavirus 229E: NOT DETECTED
Coronavirus HKU1: NOT DETECTED
Coronavirus OC43: NOT DETECTED
INFLUENZA A-RVPPCR: NOT DETECTED
Influenza B: NOT DETECTED
Metapneumovirus: NOT DETECTED
Mycoplasma pneumoniae: NOT DETECTED
PARAINFLUENZA VIRUS 3-RVPPCR: DETECTED — AB
PARAINFLUENZA VIRUS 4-RVPPCR: NOT DETECTED
Parainfluenza Virus 1: NOT DETECTED
Parainfluenza Virus 2: NOT DETECTED
RHINOVIRUS / ENTEROVIRUS - RVPPCR: NOT DETECTED
Respiratory Syncytial Virus: NOT DETECTED

## 2017-09-30 LAB — I-STAT TROPONIN, ED: TROPONIN I, POC: 0.14 ng/mL — AB (ref 0.00–0.08)

## 2017-09-30 LAB — BASIC METABOLIC PANEL
ANION GAP: 23 — AB (ref 5–15)
Anion gap: 21 — ABNORMAL HIGH (ref 5–15)
BUN: 122 mg/dL — AB (ref 6–20)
BUN: 130 mg/dL — ABNORMAL HIGH (ref 6–20)
CO2: 12 mmol/L — ABNORMAL LOW (ref 22–32)
CO2: 12 mmol/L — AB (ref 22–32)
CREATININE: 2.52 mg/dL — AB (ref 0.61–1.24)
Calcium: 8.4 mg/dL — ABNORMAL LOW (ref 8.9–10.3)
Calcium: 8.3 mg/dL — ABNORMAL LOW (ref 8.9–10.3)
Chloride: 93 mmol/L — ABNORMAL LOW (ref 101–111)
Chloride: 94 mmol/L — ABNORMAL LOW (ref 101–111)
Creatinine, Ser: 2.5 mg/dL — ABNORMAL HIGH (ref 0.61–1.24)
GFR calc Af Amer: 29 mL/min — ABNORMAL LOW (ref >60)
GFR, EST AFRICAN AMERICAN: 29 mL/min — AB (ref >60)
GFR, EST NON AFRICAN AMERICAN: 25 mL/min — AB (ref >60)
GFR, EST NON AFRICAN AMERICAN: 25 mL/min — AB (ref >60)
GLUCOSE: 141 mg/dL — AB (ref 65–99)
GLUCOSE: 153 mg/dL — AB (ref 65–99)
POTASSIUM: 6.2 mmol/L — AB (ref 3.5–5.1)
POTASSIUM: 6.5 mmol/L — AB (ref 3.5–5.1)
SODIUM: 126 mmol/L — AB (ref 135–145)
Sodium: 129 mmol/L — ABNORMAL LOW (ref 135–145)

## 2017-09-30 LAB — POCT I-STAT 3, ART BLOOD GAS (G3+)
Acid-base deficit: 8 mmol/L — ABNORMAL HIGH (ref 0.0–2.0)
Bicarbonate: 15.7 mmol/L — ABNORMAL LOW (ref 20.0–28.0)
O2 SAT: 98 %
PCO2 ART: 28.4 mmHg — AB (ref 32.0–48.0)
TCO2: 17 mmol/L — ABNORMAL LOW (ref 22–32)
pH, Arterial: 7.352 (ref 7.350–7.450)
pO2, Arterial: 101 mmHg (ref 83.0–108.0)

## 2017-09-30 LAB — HEPATIC FUNCTION PANEL
ALBUMIN: 2.8 g/dL — AB (ref 3.5–5.0)
ALT: 1157 U/L — AB (ref 17–63)
AST: 1400 U/L — ABNORMAL HIGH (ref 15–41)
Alkaline Phosphatase: 136 U/L — ABNORMAL HIGH (ref 38–126)
Bilirubin, Direct: 1.3 mg/dL — ABNORMAL HIGH (ref 0.1–0.5)
Indirect Bilirubin: 1.9 mg/dL — ABNORMAL HIGH (ref 0.3–0.9)
TOTAL PROTEIN: 6.2 g/dL — AB (ref 6.5–8.1)
Total Bilirubin: 3.2 mg/dL — ABNORMAL HIGH (ref 0.3–1.2)

## 2017-09-30 LAB — BRAIN NATRIURETIC PEPTIDE: B Natriuretic Peptide: 2485.9 pg/mL — ABNORMAL HIGH (ref 0.0–100.0)

## 2017-09-30 LAB — TSH: TSH: 2.407 u[IU]/mL (ref 0.350–4.500)

## 2017-09-30 LAB — MAGNESIUM: Magnesium: 3.3 mg/dL — ABNORMAL HIGH (ref 1.7–2.4)

## 2017-09-30 LAB — PROCALCITONIN: Procalcitonin: 3.38 ng/mL

## 2017-09-30 LAB — LACTIC ACID, PLASMA: Lactic Acid, Venous: 5.1 mmol/L (ref 0.5–1.9)

## 2017-09-30 LAB — MRSA PCR SCREENING: MRSA by PCR: NEGATIVE

## 2017-09-30 MED ORDER — DEXTROSE 5 % IV SOLN
1.0000 g | INTRAVENOUS | Status: DC
  Administered 2017-09-30: 1 g via INTRAVENOUS
  Filled 2017-09-30: qty 10

## 2017-09-30 MED ORDER — DEXTROSE 5 % IV SOLN
500.0000 mg | Freq: Once | INTRAVENOUS | Status: AC
  Administered 2017-09-30: 500 mg via INTRAVENOUS
  Filled 2017-09-30: qty 500

## 2017-09-30 MED ORDER — DILTIAZEM LOAD VIA INFUSION
10.0000 mg | Freq: Once | INTRAVENOUS | Status: DC
  Filled 2017-09-30: qty 10

## 2017-09-30 MED ORDER — ACETAMINOPHEN 325 MG PO TABS
650.0000 mg | ORAL_TABLET | ORAL | Status: DC | PRN
  Administered 2017-10-05: 650 mg via ORAL
  Filled 2017-09-30: qty 2

## 2017-09-30 MED ORDER — DIGOXIN 125 MCG PO TABS: 0.1250 mg | ORAL_TABLET | Freq: Every day | ORAL | Status: DC

## 2017-09-30 MED ORDER — AMIODARONE HCL IN DEXTROSE 360-4.14 MG/200ML-% IV SOLN
60.0000 mg/h | INTRAVENOUS | Status: AC
  Administered 2017-09-30: 60 mg/h via INTRAVENOUS
  Filled 2017-09-30: qty 200

## 2017-09-30 MED ORDER — DIGOXIN 0.25 MG/ML IJ SOLN: 0.1250 mg | Freq: Once | INTRAMUSCULAR | Status: DC

## 2017-09-30 MED ORDER — FUROSEMIDE 10 MG/ML IJ SOLN
80.0000 mg | Freq: Three times a day (TID) | INTRAMUSCULAR | Status: DC
  Administered 2017-09-30 – 2017-10-01 (×2): 80 mg via INTRAVENOUS
  Filled 2017-09-30 (×2): qty 8

## 2017-09-30 MED ORDER — INSULIN ASPART 100 UNIT/ML IV SOLN
10.0000 [IU] | Freq: Once | INTRAVENOUS | Status: AC
  Administered 2017-09-30: 10 [IU] via INTRAVENOUS

## 2017-09-30 MED ORDER — ACETAMINOPHEN 325 MG PO TABS
650.0000 mg | ORAL_TABLET | Freq: Once | ORAL | Status: AC
  Administered 2017-09-30: 650 mg via ORAL
  Filled 2017-09-30: qty 2

## 2017-09-30 MED ORDER — SODIUM CHLORIDE 0.9 % IV SOLN
1.0000 g | Freq: Once | INTRAVENOUS | Status: AC
  Administered 2017-09-30: 1 g via INTRAVENOUS
  Filled 2017-09-30: qty 10

## 2017-09-30 MED ORDER — SODIUM CHLORIDE 0.9 % IV SOLN: 1.0000 g | Freq: Once | INTRAVENOUS | Status: DC

## 2017-09-30 MED ORDER — APIXABAN 5 MG PO TABS
5.0000 mg | ORAL_TABLET | ORAL | Status: DC
  Filled 2017-09-30: qty 1

## 2017-09-30 MED ORDER — SODIUM POLYSTYRENE SULFONATE 15 GM/60ML PO SUSP
30.0000 g | Freq: Once | ORAL | Status: DC
  Filled 2017-09-30: qty 120

## 2017-09-30 MED ORDER — SODIUM CHLORIDE 0.9% FLUSH
3.0000 mL | Freq: Two times a day (BID) | INTRAVENOUS | Status: DC
  Administered 2017-09-30 – 2017-10-18 (×22): 3 mL via INTRAVENOUS
  Administered 2017-10-19: 5 mL via INTRAVENOUS
  Administered 2017-10-20 – 2017-10-27 (×5): 3 mL via INTRAVENOUS

## 2017-09-30 MED ORDER — AMIODARONE HCL IN DEXTROSE 360-4.14 MG/200ML-% IV SOLN
60.0000 mg/h | INTRAVENOUS | Status: DC
  Administered 2017-09-30 (×2): 60 mg/h via INTRAVENOUS
  Administered 2017-10-01: 30 mg/h via INTRAVENOUS
  Administered 2017-10-01 – 2017-10-09 (×30): 60 mg/h via INTRAVENOUS
  Filled 2017-09-30 (×33): qty 200

## 2017-09-30 MED ORDER — HEPARIN (PORCINE) IN NACL 100-0.45 UNIT/ML-% IJ SOLN
1350.0000 [IU]/h | INTRAMUSCULAR | Status: DC
  Administered 2017-09-30: 1150 [IU]/h via INTRAVENOUS
  Filled 2017-09-30: qty 250

## 2017-09-30 MED ORDER — SODIUM CHLORIDE 0.9 % IV SOLN
250.0000 mL | INTRAVENOUS | Status: DC | PRN
  Administered 2017-10-03 – 2017-10-10 (×4): 250 mL via INTRAVENOUS

## 2017-09-30 MED ORDER — AMIODARONE LOAD VIA INFUSION
150.0000 mg | Freq: Once | INTRAVENOUS | Status: AC
  Administered 2017-09-30: 150 mg via INTRAVENOUS
  Filled 2017-09-30: qty 83.34

## 2017-09-30 MED ORDER — FUROSEMIDE 10 MG/ML IJ SOLN
80.0000 mg | Freq: Once | INTRAMUSCULAR | Status: AC
  Administered 2017-09-30: 80 mg via INTRAVENOUS
  Filled 2017-09-30: qty 8

## 2017-09-30 MED ORDER — DOXYCYCLINE HYCLATE 100 MG PO TABS
100.0000 mg | ORAL_TABLET | Freq: Two times a day (BID) | ORAL | Status: DC
  Administered 2017-09-30 – 2017-10-02 (×5): 100 mg via ORAL
  Filled 2017-09-30 (×5): qty 1

## 2017-09-30 MED ORDER — SPIRONOLACTONE 12.5 MG HALF TABLET
12.5000 mg | ORAL_TABLET | Freq: Every day | ORAL | Status: DC
  Filled 2017-09-30: qty 1

## 2017-09-30 MED ORDER — DEXTROSE 50 % IV SOLN
1.0000 | Freq: Once | INTRAVENOUS | Status: AC
  Administered 2017-09-30: 50 mL via INTRAVENOUS
  Filled 2017-09-30: qty 50

## 2017-09-30 MED ORDER — AMIODARONE HCL IN DEXTROSE 360-4.14 MG/200ML-% IV SOLN
30.0000 mg/h | INTRAVENOUS | Status: DC
  Filled 2017-09-30: qty 200

## 2017-09-30 MED ORDER — HEPARIN (PORCINE) IN NACL 100-0.45 UNIT/ML-% IJ SOLN
1150.0000 [IU]/h | INTRAMUSCULAR | Status: DC
  Filled 2017-09-30: qty 250

## 2017-09-30 MED ORDER — DILTIAZEM HCL-DEXTROSE 100-5 MG/100ML-% IV SOLN (PREMIX): 5.0000 mg/h | INTRAVENOUS | Status: DC

## 2017-09-30 MED ORDER — SODIUM POLYSTYRENE SULFONATE PO POWD
30.0000 g | Freq: Once | ORAL | Status: AC
  Administered 2017-09-30: 30 g via ORAL
  Filled 2017-09-30: qty 30

## 2017-09-30 MED ORDER — APIXABAN 5 MG PO TABS: 5.0000 mg | ORAL_TABLET | Freq: Two times a day (BID) | ORAL | Status: DC

## 2017-09-30 MED ORDER — SODIUM CHLORIDE 0.9% FLUSH: 3.0000 mL | INTRAVENOUS | Status: DC | PRN

## 2017-09-30 MED ORDER — SODIUM BICARBONATE 8.4 % IV SOLN
INTRAVENOUS | Status: DC
  Administered 2017-09-30 – 2017-10-01 (×2): via INTRAVENOUS
  Filled 2017-09-30 (×2): qty 150

## 2017-09-30 MED ORDER — ALBUTEROL SULFATE (2.5 MG/3ML) 0.083% IN NEBU: 2.5000 mg | INHALATION_SOLUTION | Freq: Four times a day (QID) | RESPIRATORY_TRACT | Status: DC | PRN

## 2017-09-30 MED ORDER — SODIUM POLYSTYRENE SULFONATE 15 GM/60ML PO SUSP
30.0000 g | Freq: Once | ORAL | Status: AC
  Administered 2017-09-30: 30 g via ORAL
  Filled 2017-09-30: qty 120

## 2017-09-30 MED ORDER — DIGOXIN 125 MCG PO TABS: 0.0625 mg | ORAL_TABLET | Freq: Every day | ORAL | Status: DC

## 2017-09-30 MED ORDER — ONDANSETRON HCL 4 MG/2ML IJ SOLN
4.0000 mg | Freq: Four times a day (QID) | INTRAMUSCULAR | Status: DC | PRN
  Administered 2017-10-02 – 2017-10-03 (×2): 4 mg via INTRAVENOUS
  Filled 2017-09-30 (×2): qty 2

## 2017-09-30 NOTE — ED Triage Notes (Signed)
Pt in from UC where RN reports pt declined EMS transport, pt appears SOB and pale, pt reported to have a fib and HR 160s, pt taken to POD E for EKG and evaluation

## 2017-09-30 NOTE — ED Notes (Signed)
EKG given to Dr. Sheryle Hail. MD at bedside speaking with patient.

## 2017-09-30 NOTE — ED Notes (Signed)
I Stat Trop I results shown to Dr. Charm Barges

## 2017-09-30 NOTE — ED Provider Notes (Signed)
[facility] EMERGENCY DEPARTMENT Provider Note   CSN: [id number] Arrival date & time: 09/15/2017  1138     History   Chief Complaint Chief Complaint  Patient presents with  . Atrial Fibrillation    HPI  Bryan Harmon is a 67 y.o. Male with a history of A. fib, heart failure, and hypertension, who presents to the ED from urgent care with a complaint of 1 week of generalized weakness and fatigue.  At urgent care patient was found to be in A. fib with RVR with heart rate in the 150s-160s.  Patient reports he has not taken any of his heart medications including Coreg, Eliquis or Entresto for the last 6 months.  Patient denies any chest pain, shortness of breath, orthopnea, cough, fevers or weight gain.  Patient denies shortness of breath, he does exhibit some tachypnea and increased work of breathing.  Patient denies lower extremity swelling or pain.  Patient denies any palpitations, lightheadedness or syncope.  No nausea, vomiting or diaphoresis.  Patient reports he has been having diarrhea after eating frequently over the past few weeks and has had decreased appetite, but denies focal abdominal pain, reports the smell of food frequently makes him nauseous.  Pt is followed by Dr. [name] with cardiology, he tried to get an appointment with him in the office, but today Dr. [name] is here in the hospital rounding.  Patient initially did not want to come into the ED today, but was convinced by urgent care doctor.      Past Medical History:  Diagnosis Date  . Hypertension   . Inguinal hernia, bilateral     Patient Active Problem List   Diagnosis Date Noted  . Chronic systolic heart failure (HCC) 12/13/2016  . PVC (premature ventricular contraction) 12/13/2016  . A-fib (HCC) 12/13/2016  . Essential hypertension, benign 02/27/2012  . Bilateral inguinal hernia 01/25/2010  . 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 APPENDECTOMY  age 11  . CARDIAC CATHETERIZATION N/A 07/05/2016   Procedure: Right/Left Heart Cath and Coronary Angiography;  Surgeon: Laurey Morale, MD;  Location: [facility] INVASIVE CV LAB;  Service: Cardiovascular;  Laterality: N/A;  . CARDIOVERSION N/A 12/07/2016   Procedure: CARDIOVERSION;  Surgeon: Laurey Morale, MD;  Location: [facility] ENDOSCOPY;  Service: Cardiovascular;  Laterality: N/A;  . DENTAL SURGERY    . INGUINAL HERNIA REPAIR Bilateral 12/07/2015   Procedure: LAPAROSCOPIC BILATERAL INGUINAL HERNIA REPAIR WITH MESH;  Surgeon: Luretha Murphy, MD;  Location: [facility] ORS;  Service: General;  Laterality: Bilateral;  . TEE WITHOUT CARDIOVERSION N/A 12/07/2016   Procedure: TRANSESOPHAGEAL ECHOCARDIOGRAM (TEE);  Surgeon: Laurey Morale, MD;  Location: [facility] ENDOSCOPY;  Service: Cardiovascular;  Laterality: N/A;       Home Medications    Prior to Admission medications   Medication Sig Start Date End Date Taking? Authorizing Provider  apixaban (ELIQUIS) 5 MG TABS tablet Take 1 tablet (5 mg total) by mouth 2 (two) times daily. Patient not taking: Reported on 09/20/2017 02/11/17   Laurey Morale, MD  carvedilol (COREG) 6.25 MG tablet Take 0.5 tablets (3.125 mg total) by mouth 2 (two) times daily with a meal. Patient not taking: Reported on 09/22/2017 02/11/17   Laurey Morale, MD  furosemide (LASIX) 20 MG tablet Take 2 tablets (40 mg total) by mouth daily. Patient not taking: Reported on 10/02/2017 02/11/17 05/12/17  Laurey Morale, MD  sacubitril-valsartan (ENTRESTO) 24-26 MG Take 1 tablet  by mouth 2 (two) times daily. Patient not taking: Reported on 09/13/2017 02/11/17   Laurey Morale, MD  spironolactone (ALDACTONE) 25 MG tablet Take 1 tablet (25 mg total) by mouth daily. Patient not taking: Reported on 09/12/2017 12/20/16 03/20/17  Laurey Morale, MD    Family History Family History  Problem Relation Age of Onset  . Hypertension Maternal Grandmother   . Cancer Neg Hx   . Heart disease Neg  Hx     Social History Social History   Tobacco Use  . Smoking status: Former Smoker    Last attempt to quit: 09/10/1996    Years since quitting: 21.0  . Smokeless tobacco: Never Used  Substance Use Topics  . Alcohol use: Yes    Comment: occasional   . Drug use: No     Allergies   Patient has no known allergies.   Review of Systems Review of Systems  Constitutional: Positive for fatigue. Negative for chills and fever.  HENT: Negative for congestion, rhinorrhea and sore throat.   Eyes: Negative for photophobia and visual disturbance.  Respiratory: Negative for cough, chest tightness, shortness of breath and wheezing.   Cardiovascular: Negative for chest pain, palpitations and leg swelling.  Gastrointestinal: Negative for abdominal pain, nausea and vomiting.  Genitourinary: Negative for dysuria and frequency.  Musculoskeletal: Negative for arthralgias and myalgias.  Skin: Negative for rash.  Neurological: Positive for weakness. Negative for dizziness, syncope, facial asymmetry, light-headedness, numbness and headaches.     Physical Exam Updated Vital Signs BP (!) 122/102 (BP Location: Left Arm)   Pulse (!) 132   Resp (!) 29   SpO2 97%   Physical Exam  Constitutional: He appears well-developed and well-nourished. No distress.  HENT:  Head: Normocephalic and atraumatic.  Mouth/Throat: Oropharynx is clear and moist.  Eyes: EOM are normal. Pupils are equal, round, and reactive to light. Right eye exhibits no discharge. Left eye exhibits no discharge.  Neck: Neck supple.  Cardiovascular: Intact distal pulses.  Tachycardia and rhythm is irregularly irregular  Pulmonary/Chest:  Pt is tachypneic with mild increased work of breathing, pt able to speak in short sentences, lungs are clear to auscultation  Abdominal: Soft. Bowel sounds are normal. He exhibits no distension and no mass. There is no tenderness. There is no guarding.  Musculoskeletal: He exhibits no edema or  deformity.  Neurological: He is alert. Coordination normal.  Speech is clear, able to follow commands CN III-XII intact Normal strength in upper and lower extremities bilaterally including dorsiflexion and plantar flexion, strong and equal grip strength Sensation normal to light and sharp touch Moves extremities without ataxia, coordination intact  Skin: Skin is warm and dry. Capillary refill takes less than 2 seconds. He is not diaphoretic.  Nursing note and vitals reviewed.    ED Treatments / Results  Labs (all labs ordered are listed, but only abnormal results are displayed) Labs Reviewed  BASIC METABOLIC PANEL - Abnormal; Notable for the following components:      Result Value   Sodium 126 (*)    Potassium 6.2 (*)    Chloride 93 (*)    CO2 12 (*)    Glucose, Bld 141 (*)    BUN 122 (*)    Creatinine, Ser 2.52 (*)    Calcium 8.4 (*)    GFR calc non Af Amer 25 (*)    GFR calc Af Amer 29 (*)    Anion gap 21 (*)    All other components within normal limits  CBC - Abnormal; Notable for the following components:   WBC 17.6 (*)    Platelets 472 (*)    All other components within normal limits  I-STAT TROPONIN, ED - Abnormal; Notable for the following components:   Troponin i, poc 0.14 (*)    All other components within normal limits  RESPIRATORY PANEL BY PCR  CULTURE, BLOOD (ROUTINE X 2)  CULTURE, BLOOD (ROUTINE X 2)  MAGNESIUM  TSH  BRAIN NATRIURETIC PEPTIDE  HEPARIN LEVEL (UNFRACTIONATED)    EKG  EKG Interpretation  Date/Time:  Monday September 30 2017 11:47:15 EST Ventricular Rate:  144 PR Interval:    QRS Duration: 112 QT Interval:  327 QTC Calculation: 507 R Axis:   -36 Text Interpretation:  atrial tachycardia, likely afib with RVR LVH with IVCD, LAD and secondary repol abnrm Prolonged QT interval st depression anterior/lateral  with t inversions Confirmed by Meridee Score [phone]) on 10/06/2017 12:02:53 PM       Radiology Dg Chest Portable 1  View  Result Date: 10/02/2017 CLINICAL DATA:  Shortness of breath, tachycardia, nonproductive cough, and generalized weakness. EXAM: PORTABLE CHEST 1 VIEW COMPARISON:  07/02/2016 FINDINGS: The cardiac silhouette is moderately to prominently enlarged, accentuated compared to the prior study due to AP technique on the current examination. The thoracic aorta is tortuous. There is a hazy density in the right lung base which is new from the prior study. Prominence of the interstitial markings has decreased from the prior study. No pleural effusion or pneumothorax is identified. No acute osseous abnormality is seen. IMPRESSION: 1. Chronic cardiomegaly. Mild diffuse interstitial prominence, less than on the prior study though which may reflect very mild interstitial edema. 2. Focal hazy opacity in the right lung base which could reflect early pneumonia or alveolar edema. Electronically Signed   By: Sebastian Ache M.D.   On: 09/19/2017 12:19    Procedures .Critical Care Performed by: Dartha Lodge, PA-C Authorized by: Dartha Lodge, PA-C   Critical care provider statement:    Critical care time (minutes):  35   Critical care start time:  09/26/2017 12:25 PM   Critical care end time:  09/26/2017 1:00 PM   Critical care time was exclusive of:  Separately billable procedures and treating other patients   Critical care was necessary to treat or prevent imminent or life-threatening deterioration of the following conditions:  Cardiac failure, circulatory failure and renal failure   Critical care was time spent personally by me on the following activities:  Development of treatment plan with patient or surrogate, discussions with consultants, obtaining history from patient or surrogate, ordering and review of laboratory studies and pulse oximetry   I assumed direction of critical care for this patient from another provider in my specialty: no      (including critical care time)  Medications Ordered in  ED Medications  amiodarone (NEXTERONE PREMIX) 360-4.14 MG/200ML-% (1.8 mg/mL) IV infusion (60 mg/hr Intravenous New Bag/Given 09/26/2017 1244)    Followed by  amiodarone (NEXTERONE PREMIX) 360-4.14 MG/200ML-% (1.8 mg/mL) IV infusion (not administered)  furosemide (LASIX) injection 80 mg (not administered)  furosemide (LASIX) injection 80 mg (not administered)  doxycycline (VIBRA-TABS) tablet 100 mg (not administered)  cefTRIAXone (ROCEPHIN) 1 g in dextrose 5 % 50 mL IVPB (not administered)  calcium gluconate 1 g in sodium chloride 0.9 % 100 mL IVPB (not administered)  sodium polystyrene (KAYEXALATE) 15 GM/60ML suspension 30 g (not administered)  heparin ADULT infusion 100 units/mL (25000 units/248mL sodium chloride 0.45%) (not administered)  amiodarone (NEXTERONE) 1.8 mg/mL load via infusion 150 mg (150 mg Intravenous Bolus from Bag 10/10/2017 1242)     Initial Impression / Assessment and Plan / ED Course  I have reviewed the triage vital signs and the nursing notes.  Pertinent labs & imaging results that were available during my care of the patient were reviewed by me and considered in my medical decision making (see chart for details).  Clinical Course as of Sep 30 1314  Mon Sep 30, 2017  [id number] 67 year old male with prior history of paroxysmal A. fib presents as a transfer from clinic with rapid A. fib.  He is felt more fatigued over the past week or so but did not notice that his heart rate was elevated.  He is also been not taking his meds for 6 months or greater.  Currently he is tachycardic but alert in no distress.  His abdomen is soft without masses guarding or rebound.  We are starting some rate control for him and will consult cardiology.  [MB]    Clinical Course User Index [MB] Terrilee Files, MD   Presents from urgent care in A. fib with RVR, heart rate in the 150s.  Has been off of all cardiac medications including anticoagulation for the past 6 months.  Patient is followed by  Dr. [name] with cardiology.  Cardiac to the 150s or 140s and is tachypneic, vitals otherwise stable.  Patient has mild increased work of breathing but is able to speak in complete sentences, lungs are clear to auscultation.  Given tachypnea and increased work of breathing, patient started on 2 L nasal cannula with improvement.  Abdomen is nontender, without evidence of peritonitis.  EKG shows A. fib with RVR.  Labs and chest x-ray are pending.  Plan to start patient on diltiazem drip, will monitor blood pressure closely.  Page cardiology for Dr. Sherlie Ban and cardiology team to come and evaluate patient.  Dr. [name] and cardiology team at bedside to evaluate the patient, recommend amiodarone rather than diltiazem given low EF.  Chest x-ray shows cardiomegaly, there is a hazy opacification in the right lower lobe that could be early pneumonia versus pulmonary edema.  Initial troponin came back at 0.14, this is likely demand ischemia.  Labs also significant for leukocytosis of 17.6, potentially related to pneumonia, antibiotics will be initiated.  BMP and BNP are pending.    Spoke with Dr. Sherlie Ban and Tonye Becket NP at bedside cardiology plans to admit to cardiology service, with plans for pt to go to stepdown, blood cultures, lactic acid and respiratory viral panel pending.  After BMP returned patient found to be hyperkalemic at 6.2 with newly elevated creatinine at 2.52, as well as hyponatremia, discussed these results with cardiology team, then will initiate calcium gluconate and Kayexalate, patient will be admitted to the ICU for further management.  Patient discussed with Dr. Charm Barges, who saw patient as well and agrees with plan.   Final Clinical Impressions(s) / ED Diagnoses   Final diagnoses:  Atrial fibrillation with RVR (HCC)  Community acquired pneumonia of right lower lobe of lung [facility])    ED Discharge Orders        Ordered    Amb referral to AFIB Clinic     10/09/2017 1206       Legrand Rams 10/04/2017 1349    Terrilee Files, MD 09/24/2017 1924

## 2017-09-30 NOTE — ED Notes (Signed)
Pt complaining of back pain, very uncomfortable with position. Attending to be notified.

## 2017-09-30 NOTE — ED Notes (Signed)
Report attempted per [facility], Charge has not assigned RN.

## 2017-09-30 NOTE — Consult Note (Signed)
PULMONARY / CRITICAL CARE MEDICINE   Name: Bryan Harmon MRN: [id number] DOB: [birth year]    ADMISSION DATE:  09/15/2017  CHIEF COMPLAINT: extreme fatigue  HISTORY OF PRESENT ILLNESS:        This is a 67 year old with a nonischemic cardiomyopathy and a last known ejection fraction of 20-25% associated with significant mitral regurgitation.  He also has a history of intermittent atrial fibrillation.  He presented this morning with complaints of extreme fatigue and he was found to be in atrial fibrillation with a rapid ventricular response.  In addition there is a subtle suggestion of a right lower lobe infiltrate on his chest x-ray.  Of note he is not aware when he is in atrial fibrillation.  He has not had any sense of palpitations or syncope.  He traces the beginning of his fatigue to approximately 8 days ago when he was suffering from watery diarrhea and intermittent subjective fever.  There was no blood in the stool and there was no associated abdominal pain.  He has not been taking any of his medications.  He does not feel as though his weight has increased or his dependent edema is greater than usual.  He is not suffering from more than usual orthopnea.  Pertinent to the subtle right lower lobe infiltrate he has not had any cough. Laboratory studies have shown an elevated lactic acid and elevated white count.  He also has had a significant rise in his creatinine from baseline and is hyperkalemic with a bicarbonate of only 12  Treatment in the department of emergency medicine has included an amiodarone infusion, doxycycline, Rocephin, Kayexalate, and a heparin infusion  PAST MEDICAL HISTORY :  He  has a past medical history of Hypertension and Inguinal hernia, bilateral.  PAST SURGICAL HISTORY: He  has a past surgical history that includes Appendectomy (age 72); Dental surgery; Inguinal hernia repair (Bilateral, 12/07/2015); Cardiac catheterization (N/A, 07/05/2016); TEE without  cardioversion (N/A, 12/07/2016); and Cardioversion (N/A, 12/07/2016).  No Known Allergies  No current facility-administered medications on file prior to encounter.    Current Outpatient Medications on File Prior to Encounter  Medication Sig  . apixaban (ELIQUIS) 5 MG TABS tablet Take 1 tablet (5 mg total) by mouth 2 (two) times daily. (Patient not taking: Reported on 09/29/2017)  . carvedilol (COREG) 6.25 MG tablet Take 0.5 tablets (3.125 mg total) by mouth 2 (two) times daily with a meal. (Patient not taking: Reported on 10/04/2017)  . furosemide (LASIX) 20 MG tablet Take 2 tablets (40 mg total) by mouth daily. (Patient not taking: Reported on 09/27/2017)  . sacubitril-valsartan (ENTRESTO) 24-26 MG Take 1 tablet by mouth 2 (two) times daily. (Patient not taking: Reported on 09/24/2017)  . spironolactone (ALDACTONE) 25 MG tablet Take 1 tablet (25 mg total) by mouth daily. (Patient not taking: Reported on 09/23/2017)    FAMILY HISTORY:  His indicated that his mother is alive. He indicated that his father is deceased. He indicated that his sister is alive. He indicated that the status of his maternal grandmother is unknown. He indicated that his son is alive. He indicated that the status of his neg hx is unknown.   SOCIAL HISTORY: He  reports that he quit smoking about 21 years ago. he has never used smokeless tobacco. He reports that he drinks alcohol. He reports that he does not use drugs.  REVIEW OF SYSTEMS:   10 system review of systems shows that despite his cardiomyopathy at his baseline health he can walk  ad lib. and climb a flight of stairs.  He has never suffered from a stroke seizure or transient neurological deficit.  He does not have any baseline respiratory illness.  He is never had a syncopal episode.  He has not had any chest pain in the recent past or throughout his history of heart disease.  He has had very little urine production for the last week.  He has no known history of diabetes  or thyroid disease. SUBJECTIVE:  As above  VITAL SIGNS: BP (!) 120/103   Pulse 91   Resp (!) 37   Wt 180 lb (81.6 kg)   SpO2 100%   BMI 26.20 kg/m   HEMODYNAMICS:    VENTILATOR SETTINGS:    INTAKE / OUTPUT: No intake/output data recorded.  PHYSICAL EXAMINATION: General: He is thin, chronically ill-appearing, and somewhat tachypneic. Neuro: He is entirely appropriate and using all fours. Cardiovascular: S1 and S2 are irregularly irregular without murmur rub or gallop He has 2+ lower extremity edema and JVD.  Limbs are cool. Lungs: He is somewhat tachypneic but not overtly labored, there is symmetric air movement with good air movement throughout and no wheezes.  There is some scattered rhonchi Abdomen: The abdomen is flat and soft without any organomegaly masses or tenderness.  He is anicteric.  LABS:  BMET Recent Labs  Lab Oct 15, 2017 1149  NA 126*  K 6.2*  CL 93*  CO2 12*  BUN 122*  CREATININE 2.52*  GLUCOSE 141*    Electrolytes Recent Labs  Lab Oct 15, 2017 1149 15-Oct-2017 1206  CALCIUM 8.4*  --   MG  --  3.3*    CBC Recent Labs  Lab 10/15/17 1149  WBC 17.6*  HGB 14.9  HCT 43.1  PLT 472*    Coag's No results for input(s): APTT, INR in the last 168 hours.  Sepsis Markers Recent Labs  Lab 15-Oct-2017 1330  LATICACIDVEN 5.74*    ABG No results for input(s): PHART, PCO2ART, PO2ART in the last 168 hours.  Liver Enzymes No results for input(s): AST, ALT, ALKPHOS, BILITOT, ALBUMIN in the last 168 hours.  Cardiac Enzymes No results for input(s): TROPONINI, PROBNP in the last 168 hours.  Glucose No results for input(s): GLUCAP in the last 168 hours.  Imaging Dg Chest Portable 1 View  Result Date: 2017/10/15 CLINICAL DATA:  Shortness of breath, tachycardia, nonproductive cough, and generalized weakness. EXAM: PORTABLE CHEST 1 VIEW COMPARISON:  07/02/2016 FINDINGS: The cardiac silhouette is moderately to prominently enlarged, accentuated compared to  the prior study due to AP technique on the current examination. The thoracic aorta is tortuous. There is a hazy density in the right lung base which is new from the prior study. Prominence of the interstitial markings has decreased from the prior study. No pleural effusion or pneumothorax is identified. No acute osseous abnormality is seen. IMPRESSION: 1. Chronic cardiomegaly. Mild diffuse interstitial prominence, less than on the prior study though which may reflect very mild interstitial edema. 2. Focal hazy opacity in the right lung base which could reflect early pneumonia or alveolar edema. Electronically Signed   By: Sebastian Ache M.D.   On: 2017/10/15 12:19       DISCUSSION:      This is a 68 year old with a cardiomyopathy who presents with extreme fatigue.  He is found to be in atrial fibrillation with a rapid ventricular response.  He reports a recent history of watery diarrhea and has had a bump in his creatinine and developed a non-gap  acidosis with an elevated potassium.  He also has a significantly elevated lactic acid.  If not for his leukocytosis, I would attribute his constellation of findings entirely to a low cardiac output state related to dehydration from his diarrhea and atrial fibrillation with a rapid ventricular response.   ASSESSMENT / PLAN:  PULMONARY chest x-ray findings of an infiltrate at the right base are subtle at best.  He has been empirically covered with a combination of Rocephin and doxycycline and a respiratory pathogen panel has been ordered.  Symptomatically and to decrease the work of breathing in a patient with limited cardiac output I will treat him with BiPAP should his dyspnea worsen.  He does not wish to be intubated.  CARDIOVASCULAR A: I am most suspicious that his lactic acidosis and fatigue are on the basis of low cardiac output.  Hopefully this will improve as his rate is controlled and he has more effective filling.  I will be repeating his lactic acid  later in the day to test this hypothesis.  I am aware of cardiology is concerned about fluid overload and have started him on a low volume infusion of bicarbonate to correct his substantial metabolic acidosis and hyperkalemia.  RENAL A: His creatinine is more than doubled since it was last checked and found to be 1.08.  He has a metabolic acidosis and hyperkalemia.  I suspect on the basis of volume loss with his diarrhea.  I have gently initiated a bicarbonate infusion to correct his electrolyte abnormalities while respecting cardiology desire to limit volume.   INFECTIOUS A: He has been appropriately covered with a combination of Rocephin and doxycycline to treat a potential right lower lobe infiltrate as the provocation for his leukocytosis.  Based on his current history and exam I do not suspect bowel ischemia as the provocation for his leukocytosis and diarrhea.   FAMILY   His wife was present during my examination and is aware of the plan of care.  She is also aware that her husband does not wish to be intubated.  32 minutes was spent in the care of this patient today.  Penny Pia, MD Pulmonary and Critical Care Medicine [facility] Pager: [phone]  Oct 26, 2017, 2:59 PM

## 2017-09-30 NOTE — ED Notes (Signed)
Per Dr. Sheryle Hail, pt wife okay to wheel patient down to ER. Pt sent to ER with wife.

## 2017-09-30 NOTE — ED Notes (Signed)
ED Provider at bedside. 

## 2017-09-30 NOTE — ED Triage Notes (Signed)
Per family, pt c/o extreme fatigue. Three weeks ago he had a fever and some diarrhea. Pt now has fatigue, with poor appetite. Pt family states he hasn't been eating anything just drinking water.

## 2017-09-30 NOTE — Progress Notes (Signed)
ANTICOAGULATION CONSULT NOTE - Initial Consult  Pharmacy Consult for Heparin  Indication: atrial fibrillation  No Known Allergies  Patient Measurements:  Total Body Weight 81kg   Vital Signs: Temp: 98.3 F (36.8 C) (01/21 1039) Temp Source: Temporal (01/21 1039) BP: 107/87 (01/21 1300) Pulse Rate: 130 (01/21 1300)  Labs: Recent Labs    09/13/2017 1149  HGB 14.9  HCT 43.1  PLT 472*  CREATININE 2.52*    CrCl cannot be calculated (Unknown ideal weight.).   Medical History: Past Medical History:  Diagnosis Date  . Hypertension   . Inguinal hernia, bilateral       Assessment: 66yom with EF 25% known medication non-compliance.  Seen in ED acutely SOB volume overloaded > furosemide iv and Afib RVR start amiodarone drip.  Complicated with AKI Cr 2.5 K 6.2 plan to place central line and begin heparin drip after line placed.   WBC up 17 CXR RLL opacity > start doxy and rocephin.  Goal of Therapy:  Heparin level 0.3-0.7 units/ml Monitor platelets by anticoagulation protocol: Yes   Plan:  After Central Line placed Start heparin drip 1150 uts/hr  - no bolus Heparin level 6hr after start  Daily HL, CBC  Leota Sauers Pharm.D. CPP, BCPS Clinical Pharmacist [phone] 09/28/2017 1:23 PM

## 2017-09-30 NOTE — ED Notes (Signed)
I Stat Lactic Acid results shown to [facility].

## 2017-09-30 NOTE — ED Notes (Signed)
Heparin order appears to be d/c. Order will be verified with Attending.   Bedside report given to Tray, RN    Nephrologist at bedside.

## 2017-09-30 NOTE — ED Notes (Addendum)
Heparin verified with Apolonio Schneiders, RN at bedside

## 2017-09-30 NOTE — Consult Note (Addendum)
Advanced Heart Failure Team H& P  Primary Physician: Primary Cardiologist:  Dr Gala Romney  Reason for Consultation: Heart Failure   HPI:    Bryan Harmon is seen today for evaluation of heart failure at the request of Dr Sheryle Hail .   [name] is a 67 year old with a history of PAF S/P DC-CV 11/2016, s/p bilateral inguinal hernia repair 11/2017, PVCs, HTN, NICM, chronic systolic heart failure.  He has been followed by Dr [name] in the community and was last seen June 2018.  At that he was taking some HF medications but asking to stop something. Digoxin was stopped.  Today he says he hasnt had any medications since April 2018 (thought he had restarted his meds in 6/18 but apparently he did not). About 3 weeks ago he had a viral illness with diarrhea. Wife wanted him hospitalized in [place] where they had been visiting grandchildren for several months but he wanted to come back to [place].  Just returned [place] on Friday and has been very short of breath.    Earlier today he was sent from Urgent Care with A fib RVR, tachypneic. He was reluctant to come the ED but agreed to be evaluated by Dr [name]. Had not been taking cardiac meds for some time. CXR concerning for RLL? Pneumonia versus edema.  Pertinent labs included: troponin 0.14, WBC 17.6. SOB at rest.   Cardiac Test  TEE 11/2016  EF 25% No LA thrombus   Echo 06/2016 EF 20-25% Recommendations for MRI to assess for non-compaction.    LHC/RHC  07/2016  20% distal LM stenosis RA mean 1 RV 27/2 PA 40/18, mean 28 PCWP mean 18 LV 115/12 AO 118/73 Oxygen saturations: PA 62% AO 96% Cardiac Output (Fick) 3.97  Cardiac Index (Fick) 1.9 PVR 2.5 WU  Review of Systems: [y] = yes, [ ]  = no   General: Weight gain [ ] ; Weight loss [ ] ; Anorexia [ ] ; Fatigue [ Y]; Fever [ ] ; Chills [ ] ; Weakness [Y ]  Cardiac: Chest pain/pressure [ ] ; Resting SOB [Y ]; Exertional SOB [ Y]; Orthopnea [place]ienne ]; Pedal Edema [Y ]; Palpitations [ ] ;  Syncope [ ] ; Presyncope [ ] ; Paroxysmal nocturnal dyspnea[ ]   Pulmonary: Cough [ Y]; Wheezing[ ] ; Hemoptysis[ ] ; Sputum [ ] ; Snoring [ ]   GI: Vomiting[ ] ; Dysphagia[ ] ; Melena[ ] ; Hematochezia [ ] ; Heartburn[ ] ; Abdominal pain [ ] ; Constipation [ ] ; Diarrhea [ ] ; BRBPR [ ]   GU: Hematuria[ ] ; Dysuria [ ] ; Nocturia[ ]   Vascular: Pain in legs with walking [ ] ; Pain in feet with lying flat [ ] ; Non-healing sores [ ] ; Stroke [ ] ; TIA [ ] ; Slurred speech [ ] ;  Neuro: Headaches[ ] ; Vertigo[ ] ; Seizures[ ] ; Paresthesias[ ] ;Blurred vision [ ] ; Diplopia [ ] ; Vision changes [ ]   Ortho/Skin: Arthritis [ ] ; Joint pain [ Y]; Muscle pain [ ] ; Joint swelling [ ] ; Back Pain [ ] ; Rash [ ]   Psych: Depression[ ] ; Anxiety[ ]   Heme: Bleeding problems [ ] ; Clotting disorders [ ] ; Anemia [ ]   Endocrine: Diabetes [ ] ; Thyroid dysfunction[ ]   Home Medications Prior to Admission medications   Medication Sig Start Date End Date Taking? Authorizing Provider  apixaban (ELIQUIS) 5 MG TABS tablet Take 1 tablet (5 mg total) by mouth 2 (two) times daily. Patient not taking: Reported on 09/21/2017 02/11/17   Laurey Morale, MD  carvedilol (COREG) 6.25 MG tablet Take 0.5 tablets (3.125 mg total) by mouth 2 (  two) times daily with a meal. Patient not taking: Reported on 09/12/2017 02/11/17   Laurey Morale, MD  furosemide (LASIX) 20 MG tablet Take 2 tablets (40 mg total) by mouth daily. Patient not taking: Reported on 09-Oct-2017 02/11/17 05/12/17  Laurey Morale, MD  sacubitril-valsartan (ENTRESTO) 24-26 MG Take 1 tablet by mouth 2 (two) times daily. Patient not taking: Reported on 09/16/2017 02/11/17   Laurey Morale, MD  spironolactone (ALDACTONE) 25 MG tablet Take 1 tablet (25 mg total) by mouth daily. Patient not taking: Reported on Oct 09, 2017 12/20/16 03/20/17  Laurey Morale, MD    Past Medical History: Past Medical History:  Diagnosis Date  . Hypertension   . Inguinal hernia, bilateral     Past Surgical History: Past  Surgical History:  Procedure Laterality Date  . APPENDECTOMY  age 66  . CARDIAC CATHETERIZATION N/A 07/05/2016   Procedure: Right/Left Heart Cath and Coronary Angiography;  Surgeon: Laurey Morale, MD;  Location: [facility] INVASIVE CV LAB;  Service: Cardiovascular;  Laterality: N/A;  . CARDIOVERSION N/A 12/07/2016   Procedure: CARDIOVERSION;  Surgeon: Laurey Morale, MD;  Location: [facility] ENDOSCOPY;  Service: Cardiovascular;  Laterality: N/A;  . DENTAL SURGERY    . INGUINAL HERNIA REPAIR Bilateral 12/07/2015   Procedure: LAPAROSCOPIC BILATERAL INGUINAL HERNIA REPAIR WITH MESH;  Surgeon: Luretha Murphy, MD;  Location: [facility] ORS;  Service: General;  Laterality: Bilateral;  . TEE WITHOUT CARDIOVERSION N/A 12/07/2016   Procedure: TRANSESOPHAGEAL ECHOCARDIOGRAM (TEE);  Surgeon: Laurey Morale, MD;  Location: [facility] ENDOSCOPY;  Service: Cardiovascular;  Laterality: N/A;    Family History: Family History  Problem Relation Age of Onset  . Hypertension Maternal Grandmother   . Cancer Neg Hx   . Heart disease Neg Hx     Social History: Social History   Socioeconomic History  . Marital status: Married    Spouse name: Not on file  . Number of children: 1  . Years of education: Not on file  . Highest education level: Not on file  Social Needs  . Financial resource strain: Not on file  . Food insecurity - worry: Not on file  . Food insecurity - inability: Not on file  . Transportation needs - medical: Not on file  . Transportation needs - non-medical: Not on file  Occupational History  . 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 Smoking status: Former Smoker    Last attempt to quit: 09/10/1996    Years since quitting: 21.0  . Smokeless tobacco: Never Used  Substance and Sexual Activity  . Alcohol use: Yes    Comment: occasional   . Drug use: No  . Sexual activity: Not on file  Other Topics Concern  . Not on file  Social History Narrative   Married.  Son lives in  [place]. Ultra-marathoner    Allergies:  No Known Allergies  Objective:    Vital Signs:   Pulse Rate:  [132] 132 (01/21 1150) Resp:  [29] 29 (01/21 1150) BP: (122)/(102) 122/102 (01/21 1150) SpO2:  [97 %] 97 % (01/21 1150)    Weight change: There were no vitals filed for this visit.  Intake/Output:  No intake or output data in the 24 hours ending 10/04/2017 1233    Physical Exam    General: Dyspneic at rest    HEENT: normal Neck: supple. JVP to jaw. Carotids 2+ bilat; no bruits. No lymphadenopathy or thyromegaly appreciated. Cor: PMI nondisplaced. Tachy Irregular rate & rhythm.  No rubs, gallops or murmurs. Lungs: Decreased RLL on 3 liters oxygen. Abdomen: soft, nontender, nondistended. No hepatosplenomegaly. No bruits or masses. Good bowel sounds. Extremities: no cyanosis, clubbing, rash, R and LLE 1+ edema Neuro: alert & orientedx3, cranial nerves grossly intact. moves all 4 extremities w/o difficulty. Affect  flat   Telemetry  A fib 140s  EKG    A fib RVR 140s   Labs   Basic Metabolic Panel: No results for input(s): NA, K, CL, CO2, GLUCOSE, BUN, CREATININE, CALCIUM, MG, PHOS in the last 168 hours.  Liver Function Tests: No results for input(s): AST, ALT, ALKPHOS, BILITOT, PROT, ALBUMIN in the last 168 hours. No results for input(s): LIPASE, AMYLASE in the last 168 hours. No results for input(s): AMMONIA in the last 168 hours.  CBC: Recent Labs  Lab 10/05/2017 1149  WBC 17.6*  HGB 14.9  HCT 43.1  MCV 96.9  PLT 472*    Cardiac Enzymes: No results for input(s): CKTOTAL, CKMB, CKMBINDEX, TROPONINI in the last 168 hours.  BNP: BNP (last 3 results) Recent Labs    12/05/16 1015 12/13/16 0949 02/11/17 1049  BNP 544.5* 369.2* 562.7*    ProBNP (last 3 results) No results for input(s): PROBNP in the last 8760 hours.   CBG: No results for input(s): GLUCAP in the last 168 hours.  Coagulation Studies: No results for input(s): LABPROT, INR in the last  72 hours.   Imaging   Dg Chest Portable 1 View  Result Date: 10/01/2017 CLINICAL DATA:  Shortness of breath, tachycardia, nonproductive cough, and generalized weakness. EXAM: PORTABLE CHEST 1 VIEW COMPARISON:  07/02/2016 FINDINGS: The cardiac silhouette is moderately to prominently enlarged, accentuated compared to the prior study due to AP technique on the current examination. The thoracic aorta is tortuous. There is a hazy density in the right lung base which is new from the prior study. Prominence of the interstitial markings has decreased from the prior study. No pleural effusion or pneumothorax is identified. No acute osseous abnormality is seen. IMPRESSION: 1. Chronic cardiomegaly. Mild diffuse interstitial prominence, less than on the prior study though which may reflect very mild interstitial edema. 2. Focal hazy opacity in the right lung base which could reflect early pneumonia or alveolar edema. Electronically Signed   By: Sebastian Ache M.D.   On: 10/03/2017 12:19      Medications:     Current Medications: . amiodarone  150 mg Intravenous Once     Infusions: . amiodarone     Followed by  . amiodarone         Patient Profile    [name] is a 67 year old with a history of PAF S/P DC-CV 11/2016, s/p bilateral inguinal hernia repair 11/2017, PVCs, HTN, NICM, chronic systolic heart failure.   Sent from Urgent Care with A fib RVR. Acutely SOB on arrival   Assessment/Plan   1. A fib RVR Start amio drip now .  Start heparin drip.  2. ID  WBC 17.6  CXR possible RLL pneumonia Lactic Acid Now   Blood CX  Now.  Start doxycycline   3. Acute/Chronic Systolic Heart Failure  Repeat ECHO Volume overloaded on exam. Start 80 mg IV lasix twice daily with first dose now.  No spiro,dig, arb for now with hyperkalemia. Marland Kitchen   4. Hyperkalemia Give Kayxylate and calcium gluconate now.   5. AKI  Creatinine on admit 2.5.   Admit ICU. CCM consulted for central  access   Medication concerns reviewed with patient  and pharmacy team. Barriers identified: noncompliance   Length of Stay: 0  Tonye Becket, NP  09/28/2017, 12:33 PM  Advanced Heart Failure Team Pager [phone] (M-F; 7a - 4p)  Please contact [facility] for night-coverage after hours (4p -7a ) and weekends on [email or web address]  Patient seen with NP, agree with the above note.  He has been "sick" for at least 3 weeks, started out with a flu-like illness and then became tachycardic and more and more short of breath.  He now tells me that he has not taken any of his cardiac meds sinc 4/18.  He is tachypneic with JVP 16 cm, heart tachy/irregular, 1+ edema 1/2 to knees bilaterally.  CXR shows mild pulmonary edema + possible right base PNA.  Labs show WBCs 17, K 6.2, BUN 122, creatinine 2.5, CO2 12.  Renal function was normal when last checked in 6/18.  We had trouble convincing him to stay in the hospital, he wants to go home.   1. Acute hypoxemic respiratory failure: Possible RLL PNA with elevated WBCs.  Also acute/chronic systolic CHF.  - Send blood/sputum cultures, start treatment for community-acquired PNA with ceftriaxone/doxycycline.  - Lasix 80 mg IV now then every 8 hrs, follow response.  1. Acute on chronic systolic CHF: Nonischemic cardiomyopathy. 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  Etiology of his CHF is not clear => no definite inciting event.  He has a history of HTN but doubt this was the only trigger.  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  This would ideally be confirmed by cMRI, but he has not wanted an MRI (concerned about side effects).  He also has frequent PVCs, 21% total on last holter in 4/18 which is a risk for fall in EF.   No family history of CMP.  Cannot rule out viral myocarditis. SPEP negative.  With medical management, he initially felt a lot better.  However, he quit all his meds in early 2018  with recurrence of NYHA III symptoms and onset of atrial fibrillation.  He had TEE-DCCV in 3/18. TEE showed that EF remained 25%.   He quit his meds again in 4/18 and apparently did not restart them when I asked him to in 6/18.  No meds probably since 4/18.  On exam, he is markedly volume overloaded and tachypneic. BP is stable. He has AKI with BUN/creatinine markedly above baseline, possibly cardiorenal (not sure how long he has been in marked RVR with his atrial fibrillation).  - Will place central line with volume overload, AKI.  Will send serum lactate.  - Will try to diurese with Lasix 80 mg IV every 8 hrs.  - Slow HR with amiodarone gtt.  - Ideally needs cMRI to r/o infiltrative disease/myocarditis and to further investigate for LV non-complaction.  Will readdress this if we can get his renal function back down.  3. 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  It is possible that the PVCs contribute to his cardiomyopathy.   - Starting amiodarone.  4. Atrial fibrillation: Persistent, now with RVR.  HR up to 140s.  Not sure how long he has been in atrial fibrillation with marked RVR, but this may have triggered his CHF and AKI due to worsening of cardiac output.  - Need to slow HR, start amiodarone gtt.  - Start heparin gtt for anticoagulation with AKI.  - Will need eventual TEE-guided DCCV. 5. AKI: Creatinine 1.08 in 6/18, he has  not been on any meds.  Suspect cardiorenal syndrome with afib/RVR and fall in cardiac output. He is hyperkalemic and acidotic.   - Treat K with calcium chloride, Lasix, and Kayexalate.  Repeat BMET later today.   - Clearly volume overloaded, will try to slow HR and diurese.  - With hyperkalemia, markedly high BUN, will ask nephrology to follow.  6. ID: WBCs 17.  Suspect PNA on CXR.  - Ceftriaxone/doxycycline   Care in the past has been severely limited by noncompliance and resistance to medical recommendations.    He will need to be in CCU.   Marca Ancona 10/05/2017 1:50 PM

## 2017-09-30 NOTE — ED Provider Notes (Signed)
[facility]    CSN: [id number] Arrival date & time: 09/28/2017  1005     History   Chief Complaint Chief Complaint  Patient presents with  . Fatigue    HPI Bryan Harmon is a 67 y.o. male.   This is a 67 year old male with past medical history of systolic CHF with EF of 20-25%, moderate to severe mitral regurg, and paroxysmal atrial fibrillation who presents to clinic with fatigue times 3 weeks.  The patient does not take any of his medications for congestive heart failure.  He has elected to do this in an effort to acclimate to his cardiac function (the patient used to be a marathon runner).  He denies orthopnea, cough or weight gain.  He also denies lightheadedness or palpitations.  Denies chest pain, vomiting or diaphoresis.  However, the patient did have a diarrheal illness 3 weeks ago from which he feels he has become more dehydrated.  He has not been eating much during this time and also states that even the smell of food makes him nauseated.      Past Medical History:  Diagnosis Date  . Hypertension   . Inguinal hernia, bilateral     Patient Active Problem List   Diagnosis Date Noted  . Chronic systolic heart failure (HCC) 12/13/2016  . PVC (premature ventricular contraction) 12/13/2016  . A-fib (HCC) 12/13/2016  . Essential hypertension, benign 02/27/2012  . Bilateral inguinal hernia 01/25/2010  . 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 APPENDECTOMY  age 68  . CARDIAC CATHETERIZATION N/A 07/05/2016   Procedure: Right/Left Heart Cath and Coronary Angiography;  Surgeon: Laurey Morale, MD;  Location: [facility] INVASIVE CV LAB;  Service: Cardiovascular;  Laterality: N/A;  . CARDIOVERSION N/A 12/07/2016   Procedure: CARDIOVERSION;  Surgeon: Laurey Morale, MD;  Location: [facility] ENDOSCOPY;  Service: Cardiovascular;  Laterality: N/A;  . DENTAL SURGERY    . INGUINAL HERNIA REPAIR Bilateral 12/07/2015   Procedure: LAPAROSCOPIC BILATERAL INGUINAL HERNIA REPAIR WITH MESH;  Surgeon: Luretha Murphy, MD;  Location: [facility] ORS;  Service: General;  Laterality: Bilateral;  . TEE WITHOUT CARDIOVERSION N/A 12/07/2016   Procedure: TRANSESOPHAGEAL ECHOCARDIOGRAM (TEE);  Surgeon: Laurey Morale, MD;  Location: [facility] ENDOSCOPY;  Service: Cardiovascular;  Laterality: N/A;       Home Medications    Prior to Admission medications   Medication Sig Start Date End Date Taking? Authorizing Provider  apixaban (ELIQUIS) 5 MG TABS tablet Take 1 tablet (5 mg total) by mouth 2 (two) times daily. Patient not taking: Reported on 09/18/2017 02/11/17   Laurey Morale, MD  carvedilol (COREG) 6.25 MG tablet Take 0.5 tablets (3.125 mg total) by mouth 2 (two) times daily with a meal. Patient not taking: Reported on 09/12/2017 02/11/17   Laurey Morale, MD  furosemide (LASIX) 20 MG tablet Take 2 tablets (40 mg total) by mouth daily. Patient not taking: Reported on 10/10/2017 02/11/17 05/12/17  Laurey Morale, MD  sacubitril-valsartan (ENTRESTO) 24-26 MG Take 1 tablet by mouth 2 (two) times daily. Patient not taking: Reported on 09/12/2017 02/11/17   Laurey Morale, MD  spironolactone (ALDACTONE) 25 MG tablet Take 1 tablet (25 mg total) by mouth daily. Patient not taking: Reported on 09/26/2017 12/20/16 03/20/17  Laurey Morale, MD    Family History Family History  Problem Relation Age of Onset  . Hypertension Maternal Grandmother   . Cancer Neg Hx   . Heart disease  Neg Hx     Social History Social History   Tobacco Use  . Smoking status: Former Smoker    Last attempt to quit: 09/10/1996    Years since quitting: 21.0  . Smokeless tobacco: Never Used  Substance Use Topics  . Alcohol use: Yes    Comment: occasional   . Drug use: No     Allergies   Patient has no known allergies.   Review of Systems Review of Systems   Physical Exam Triage Vital Signs ED Triage Vitals [30-Oct-2017 1039]  Enc Vitals Group     BP  (!) 128/109     Pulse Rate (!) 142     Resp (!) 28     Temp 98.3 F (36.8 C)     Temp Source Temporal     SpO2 96 %     Weight      Height      Head Circumference      Peak Flow      Pain Score      Pain Loc      Pain Edu?      Excl. in GC?    No data found.  Updated Vital Signs BP (!) 128/109 (BP Location: Left Arm) Comment: Notified Tina  Pulse (!) 142 Comment: Notified Tina  Temp 98.3 F (36.8 C) (Temporal)   Resp (!) 28 Comment: Notified Tina  SpO2 96%   Visual Acuity Right Eye Distance:   Left Eye Distance:   Bilateral Distance:    Right Eye Near:   Left Eye Near:    Bilateral Near:     Physical Exam  Constitutional: He is oriented to person, place, and time. He appears well-developed and well-nourished. No distress.  HENT:  Head: Normocephalic and atraumatic.  Mouth/Throat: Oropharynx is clear and moist.  Eyes: Conjunctivae and EOM are normal. Pupils are equal, round, and reactive to light. No scleral icterus.  Neck: Normal range of motion. Neck supple. No JVD present. No tracheal deviation present. No thyromegaly present.  Cardiovascular: Normal heart sounds. An irregular rhythm present. Tachycardia present. Exam reveals no gallop and no friction rub.  No murmur heard. Pulmonary/Chest: Effort normal and breath sounds normal. No respiratory distress.  Abdominal: Soft. Bowel sounds are normal. He exhibits no distension. There is no tenderness.  Musculoskeletal: Normal range of motion. He exhibits no edema.  Lymphadenopathy:    He has no cervical adenopathy.  Neurological: He is alert and oriented to person, place, and time. No cranial nerve deficit.  Skin: Skin is warm and dry. No rash noted. No erythema.  Psychiatric: He has a normal mood and affect. His behavior is normal. Judgment and thought content normal.     UC Treatments / Results  Labs (all labs ordered are listed, but only abnormal results are displayed) Labs Reviewed - No data to  display  EKG  EKG Interpretation None       Radiology No results found.  Procedures Procedures (including critical care time)  Medications Ordered in UC Medications - No data to display   Initial Impression / Assessment and Plan / UC Course  I have reviewed the triage vital signs and the nursing notes.  Pertinent labs & imaging results that were available during my care of the patient were reviewed by me and considered in my medical decision making (see chart for details).     EKG shows atrial fibrillation with rapid ventricular rate.  Initially the patient was reluctant to go to the hospital  but I have convinced him to seek evaluation in the emergency department so that he can have IV antiarrhythmic medications.  I have also informed him that his cardiologist, Dr. Jearld Pies is inpatient rounding today and will be paged when the patient is in the emergency department.  Final Clinical Impressions(s) / UC Diagnoses   Final diagnoses:  None    ED Discharge Orders    None       Controlled Substance Prescriptions [place] Controlled Substance Registry consulted? Not Applicable   Arnaldo Natal, MD 10/27/2017 1136

## 2017-09-30 NOTE — Progress Notes (Signed)
Rt called by RN regarding pt's RR. Pt has BIPAP PRN order. RT attempted to place pt on bipap and pt refused. RT tried to tell pt it would help with his shortness of breath, pt still refused. RT made RN aware. RT will continue to monitor.

## 2017-09-30 NOTE — ED Notes (Signed)
Pt placed on bedpan

## 2017-09-30 NOTE — Telephone Encounter (Signed)
Patient showed up this morning in clinic asking for Dr. [name] to see him today.  Patient did not have an appointment.  Patient presented with flu-like symptoms that included diarrhea and vomiting.  Also complained of SOB with cough.  Patient was not SOB while we were talking.   I explained that Dr. [name] was not in clinic today and we can schedule a follow up with him but it may be a month before Dr. [name] will have an available appointment.  I offered for him to see our NP or PA but he wanted to see Dr. [name].  I advised him with his flu-like symptoms he needed to go to an urgent Care or the ER.  Patient was hesitant to go but his wife said she would try and get him to go.

## 2017-09-30 NOTE — ED Notes (Signed)
Cardiology at the bedside. IV attempt x2

## 2017-09-30 NOTE — Consult Note (Signed)
[facility] Renal Consultation Note  Requesting MD: Aundra Dubin Indication for Consultation: AKI  HPI:  Bryan Harmon is a 67 y.o. male with past medical history significant for nonischemic cardiomyopathy, ejection fraction of 20-25% and also intermittent atrial fibrillation status post cardioversion in the past.  He admits that he stopped taking all prescription medications in April 2018, he was seen in June 2018 and urged to start them but he never did.  His creatinine was 1.08 at that time.  Patient was traveling over the Christmas holiday, began to feel poorly just over 2 weeks ago.  He finally presented today to medical attention at the urging of his family with complaints of fatigue and shortness of breath.  He was found to be in A. fib with RVR.  Other evaluations found a possible pneumonia.  Labs showed creatinine of 2.5, BUN of 122, serum bicarb of 12 and potassium of 6.2.  Troponin was 0.14, BNP is 2500 and lactic acid is 5.7.  Hemoglobin 14.9 with white blood count of 17.6.  He is currently struggling to breathe.  However, he says that he feels better and wants to go home.  I confirmed with him that he was not taking any prescription medications.  He also denies any use of NSAIDs.  He says that there is been no change in his urine output but thinks he has not passed urine since he has been in the emergency department.  There is less than 100 cc of dark colored urine in his condom cath Foley bag right now after Lasix administration  Creat  Date/Time Value Ref Range Status  03/25/2012 08:40 AM 1.11 0.50 - 1.35 mg/dL Final   Creatinine, Ser  Date/Time Value Ref Range Status  10/04/2017 11:49 AM 2.52 (H) 0.61 - 1.24 mg/dL Final  02/11/2017 10:49 AM 1.08 0.61 - 1.24 mg/dL Final  12/20/2016 11:04 AM 1.18 0.61 - 1.24 mg/dL Final  12/13/2016 09:49 AM 1.25 (H) 0.61 - 1.24 mg/dL Final  12/05/2016 10:15 AM 1.49 (H) 0.61 - 1.24 mg/dL Final  08/20/2016 03:15 PM 1.19 0.61 - 1.24  mg/dL Final  07/23/2016 09:34 AM 1.22 0.61 - 1.24 mg/dL Final  07/11/2016 12:19 PM 1.35 (H) 0.61 - 1.24 mg/dL Final  07/05/2016 08:00 AM 1.33 (H) 0.61 - 1.24 mg/dL Final  07/02/2016 12:42 PM 1.38 (H) 0.61 - 1.24 mg/dL Final  12/07/2015 07:12 AM 1.39 (H) 0.61 - 1.24 mg/dL Final  11/24/2015 09:10 AM 1.20 0.61 - 1.24 mg/dL Final  10/08/2012 05:39 PM 1.11 0.50 - 1.35 mg/dL Final     PMHx:   Past Medical History:  Diagnosis Date  . Hypertension   . Inguinal hernia, bilateral     Past Surgical History:  Procedure Laterality Date  . APPENDECTOMY  age 66  . CARDIAC CATHETERIZATION N/A 07/05/2016   Procedure: Right/Left Heart Cath and Coronary Angiography;  Surgeon: Larey Dresser, MD;  Location: [facility];  Service: Cardiovascular;  Laterality: N/A;  . CARDIOVERSION N/A 12/07/2016   Procedure: CARDIOVERSION;  Surgeon: Larey Dresser, MD;  Location: [facility];  Service: Cardiovascular;  Laterality: N/A;  . DENTAL SURGERY    . INGUINAL HERNIA REPAIR Bilateral 12/07/2015   Procedure: LAPAROSCOPIC BILATERAL INGUINAL HERNIA REPAIR WITH MESH;  Surgeon: Johnathan Hausen, MD;  Location: [facility];  Service: General;  Laterality: Bilateral;  . TEE WITHOUT CARDIOVERSION N/A 12/07/2016   Procedure: TRANSESOPHAGEAL ECHOCARDIOGRAM (TEE);  Surgeon: Larey Dresser, MD;  Location: [facility];  Service: Cardiovascular;  Laterality: N/A;  Family Hx:  Family History  Problem Relation Age of Onset  . Hypertension Maternal Grandmother   . Cancer Neg Hx   . Heart disease Neg Hx     Social History:  reports that he quit smoking about 21 years ago. he has never used smokeless tobacco. He reports that he drinks alcohol. He reports that he does not use drugs.  Allergies: No Known Allergies  Medications: Prior to Admission medications   Medication Sig Start Date End Date Taking? Authorizing Provider  B Complex-C (B-COMPLEX WITH VITAMIN C) tablet Take 1 tablet by mouth daily.   Yes [provider]  Omega-3 Fatty Acids (FISH OIL) 1000 MG CAPS Take 1,000 mg by mouth daily.   Yes [provider]  apixaban (ELIQUIS) 5 MG TABS tablet Take 1 tablet (5 mg total) by mouth 2 (two) times daily. Patient not taking: Reported on 09/23/2017 02/11/17   Larey Dresser, MD  carvedilol (COREG) 6.25 MG tablet Take 0.5 tablets (3.125 mg total) by mouth 2 (two) times daily with a meal. Patient not taking: Reported on 10/03/2017 02/11/17   Larey Dresser, MD  furosemide (LASIX) 20 MG tablet Take 2 tablets (40 mg total) by mouth daily. Patient not taking: Reported on 09/16/2017 02/11/17 05/12/17  Larey Dresser, MD  sacubitril-valsartan (ENTRESTO) 24-26 MG Take 1 tablet by mouth 2 (two) times daily. Patient not taking: Reported on 09/16/2017 02/11/17   Larey Dresser, MD  spironolactone (ALDACTONE) 25 MG tablet Take 1 tablet (25 mg total) by mouth daily. Patient not taking: Reported on 09/10/2017 12/20/16 03/20/17  Larey Dresser, MD    I have reviewed the patient's current medications.  Labs:  Results for orders placed or performed during the hospital encounter of 09/25/2017 (from the past 48 hour(s))  Basic metabolic panel     Status: Abnormal   Collection Time: 10/10/2017 11:49 AM  Result Value Ref Range   Sodium 126 (L) 135 - 145 mmol/L   Potassium 6.2 (H) 3.5 - 5.1 mmol/L   Chloride 93 (L) 101 - 111 mmol/L   CO2 12 (L) 22 - 32 mmol/L   Glucose, Bld 141 (H) 65 - 99 mg/dL   BUN 122 (H) 6 - 20 mg/dL   Creatinine, Ser 2.52 (H) 0.61 - 1.24 mg/dL   Calcium 8.4 (L) 8.9 - 10.3 mg/dL   GFR calc non Af Amer 25 (L) >60 mL/min   GFR calc Af Amer 29 (L) >60 mL/min    Comment: (NOTE) The eGFR has been calculated using the CKD EPI equation. This calculation has not been validated in all clinical situations. eGFR's persistently <60 mL/min signify possible Chronic Kidney Disease.    Anion gap 21 (H) 5 - 15  CBC     Status: Abnormal   Collection Time: 09/19/2017 11:49 AM  Result Value Ref Range    WBC 17.6 (H) 4.0 - 10.5 K/uL   RBC 4.45 4.22 - 5.81 MIL/uL   Hemoglobin 14.9 13.0 - 17.0 g/dL   HCT 43.1 39.0 - 52.0 %   MCV 96.9 78.0 - 100.0 fL   MCH 33.5 26.0 - 34.0 pg   MCHC 34.6 30.0 - 36.0 g/dL   RDW 14.1 11.5 - 15.5 %   Platelets 472 (H) 150 - 400 K/uL  I-stat troponin, ED     Status: Abnormal   Collection Time: 10/06/2017 12:02 PM  Result Value Ref Range   Troponin i, poc 0.14 (HH) 0.00 - 0.08 ng/mL   Comment  NOTIFIED PHYSICIAN    Comment 3            Comment: Due to the release kinetics of cTnI, a negative result within the first hours of the onset of symptoms does not rule out myocardial infarction with certainty. If myocardial infarction is still suspected, repeat the test at appropriate intervals.   Magnesium     Status: Abnormal   Collection Time: 09/12/2017 12:06 PM  Result Value Ref Range   Magnesium 3.3 (H) 1.7 - 2.4 mg/dL  TSH     Status: None   Collection Time: 09/22/2017 12:18 PM  Result Value Ref Range   TSH 2.407 0.350 - 4.500 uIU/mL    Comment: Performed by a 3rd Generation assay with a functional sensitivity of <=0.01 uIU/mL.  Brain natriuretic peptide     Status: Abnormal   Collection Time: 09/21/2017 12:18 PM  Result Value Ref Range   B Natriuretic Peptide 2,485.9 (H) 0.0 - 100.0 pg/mL  I-Stat CG4 Lactic Acid, ED     Status: Abnormal   Collection Time: 09/16/2017  1:30 PM  Result Value Ref Range   Lactic Acid, Venous 5.74 (HH) 0.5 - 1.9 mmol/L   Comment NOTIFIED PHYSICIAN      ROS:  A comprehensive review of systems was negative except for: Respiratory: positive for dyspnea on exertion Cardiovascular: positive for chest pressure/discomfort, dyspnea and lower extremity edema  Physical Exam: Vitals:   09/12/2017 1415 09/15/2017 1518  BP: (!) 120/103 (!) 123/95  Pulse:  (!) 131  Resp: (!) 37 (!) 35  SpO2: 100% 100%     General: Thin white male.  Increased respiratory rate and in some distress HEENT: Pupils are equally round and reactive to light,  extraocular motions are intact, mucous membranes are moist Neck: Positive for JVD Heart: Tachycardic Lungs: Poor effort, decreased breath sounds at the bases Abdomen: Soft, nontender, nondistended Extremities: 2+ pitting edema bilaterally Skin: Warm and dry Neuro: For the most part oriented.  When he said that he wanted to go home I said not unless you want to meet your maker he said not yet  Assessment/Plan: 67 year old white male with cardiomyopathy on no medications due to noncompliance -he now presents with A. fib and RVR in the setting of probable pneumonitis with decompensated heart failure and acute kidney injury 1.Renal-normal creatinine 7 months ago.  Now with acute kidney injury with BUN out of proportion to creatinine.  I would suspect is secondary to a hemodynamic cause-prolonged A. fib with decreased renal perfusion.  Efforts underway to slow heart rate and improve kidney perfusion.  We will check a renal ultrasound to rule out obstruction.  He currently has a condom catheter in place with minimal urine output.  There are not absolute indications for dialysis but we are close.  I explained this to the patient.  I would support him in the short-term with dialysis-but past behavior would indicate that he is a very poor candidate for chronic dialysis therapy.  Much discussion would need to be held before that was undertaken 2. Hypertension/volume  -he is volume overloaded.  Decompensated heart failure secondary to A. fib.  Cardiology on board and efforts underway to slow heart rate down 3.  Uremia-unclear why BUN is so high out of proportion to the creatinine.  This normally signifies a GI bleed but his hemoglobin is high.  I am hopeful with just medicinal therapy and increased renal perfusion this will improve 4. Anemia  -not an issue at the moment 5.  Hyperkalemia-has been given calcium, Kayexalate and low-dose bicarb drip at 50 cc an hour was started.  Serial potassiums 6.  Metabolic  acidosis-low-dose bicarbonate drip  Thank you for this consult.  I will continue to follow with you   Derak Schurman A 09/15/2017, 3:56 PM

## 2017-09-30 NOTE — ED Notes (Signed)
Lab at bedside

## 2017-09-30 NOTE — Progress Notes (Signed)
Paged [facility] about K 6.5. Waiting on reply

## 2017-09-30 NOTE — ED Notes (Signed)
Report attempted x2 on hold for 10 mins.

## 2017-09-30 NOTE — ED Notes (Signed)
Report given to Emer on 2H.

## 2017-10-01 ENCOUNTER — Other Ambulatory Visit (HOSPITAL_COMMUNITY): Payer: Medicare Other

## 2017-10-01 ENCOUNTER — Inpatient Hospital Stay (HOSPITAL_COMMUNITY): Payer: Medicare Other

## 2017-10-01 DIAGNOSIS — Z452 Encounter for adjustment and management of vascular access device: Secondary | ICD-10-CM

## 2017-10-01 DIAGNOSIS — J9601 Acute respiratory failure with hypoxia: Secondary | ICD-10-CM

## 2017-10-01 DIAGNOSIS — J810 Acute pulmonary edema: Secondary | ICD-10-CM

## 2017-10-01 DIAGNOSIS — I5023 Acute on chronic systolic (congestive) heart failure: Secondary | ICD-10-CM

## 2017-10-01 DIAGNOSIS — J189 Pneumonia, unspecified organism: Secondary | ICD-10-CM

## 2017-10-01 DIAGNOSIS — R579 Shock, unspecified: Secondary | ICD-10-CM

## 2017-10-01 LAB — URINALYSIS, ROUTINE W REFLEX MICROSCOPIC
BILIRUBIN URINE: NEGATIVE
Glucose, UA: NEGATIVE mg/dL
Ketones, ur: NEGATIVE mg/dL
LEUKOCYTES UA: NEGATIVE
Nitrite: NEGATIVE
PH: 5 (ref 5.0–8.0)
Protein, ur: NEGATIVE mg/dL
SPECIFIC GRAVITY, URINE: 1.008 (ref 1.005–1.030)
SQUAMOUS EPITHELIAL / LPF: NONE SEEN

## 2017-10-01 LAB — RENAL FUNCTION PANEL
ALBUMIN: 2.8 g/dL — AB (ref 3.5–5.0)
ALBUMIN: 2.8 g/dL — AB (ref 3.5–5.0)
ANION GAP: 23 — AB (ref 5–15)
ANION GAP: 22 — AB (ref 5–15)
Albumin: 2.6 g/dL — ABNORMAL LOW (ref 3.5–5.0)
Anion gap: 24 — ABNORMAL HIGH (ref 5–15)
BUN: 130 mg/dL — AB (ref 6–20)
BUN: 133 mg/dL — AB (ref 6–20)
BUN: 132 mg/dL — ABNORMAL HIGH (ref 6–20)
CALCIUM: 8.2 mg/dL — AB (ref 8.9–10.3)
CALCIUM: 8 mg/dL — AB (ref 8.9–10.3)
CALCIUM: 7.7 mg/dL — AB (ref 8.9–10.3)
CHLORIDE: 90 mmol/L — AB (ref 101–111)
CO2: 14 mmol/L — ABNORMAL LOW (ref 22–32)
CO2: 14 mmol/L — ABNORMAL LOW (ref 22–32)
CO2: 18 mmol/L — ABNORMAL LOW (ref 22–32)
CREATININE: 2.8 mg/dL — AB (ref 0.61–1.24)
CREATININE: 2.8 mg/dL — AB (ref 0.61–1.24)
Chloride: 91 mmol/L — ABNORMAL LOW (ref 101–111)
Chloride: 88 mmol/L — ABNORMAL LOW (ref 101–111)
Creatinine, Ser: 2.77 mg/dL — ABNORMAL HIGH (ref 0.61–1.24)
GFR calc Af Amer: 26 mL/min — ABNORMAL LOW (ref >60)
GFR calc non Af Amer: 22 mL/min — ABNORMAL LOW (ref >60)
GFR, EST AFRICAN AMERICAN: 26 mL/min — AB (ref >60)
GFR, EST AFRICAN AMERICAN: 26 mL/min — AB (ref >60)
GFR, EST NON AFRICAN AMERICAN: 22 mL/min — AB (ref >60)
GFR, EST NON AFRICAN AMERICAN: 22 mL/min — AB (ref >60)
GLUCOSE: 198 mg/dL — AB (ref 65–99)
Glucose, Bld: 186 mg/dL — ABNORMAL HIGH (ref 65–99)
Glucose, Bld: 193 mg/dL — ABNORMAL HIGH (ref 65–99)
PHOSPHORUS: 7.9 mg/dL — AB (ref 2.5–4.6)
PHOSPHORUS: 8.2 mg/dL — AB (ref 2.5–4.6)
PHOSPHORUS: 7.5 mg/dL — AB (ref 2.5–4.6)
Potassium: 5.7 mmol/L — ABNORMAL HIGH (ref 3.5–5.1)
Potassium: 5.7 mmol/L — ABNORMAL HIGH (ref 3.5–5.1)
Potassium: 4.2 mmol/L (ref 3.5–5.1)
SODIUM: 128 mmol/L — AB (ref 135–145)
SODIUM: 128 mmol/L — AB (ref 135–145)
SODIUM: 128 mmol/L — AB (ref 135–145)

## 2017-10-01 LAB — BLOOD CULTURE ID PANEL (REFLEXED)
ACINETOBACTER BAUMANNII: NOT DETECTED
CANDIDA ALBICANS: NOT DETECTED
CANDIDA GLABRATA: NOT DETECTED
CANDIDA PARAPSILOSIS: NOT DETECTED
CANDIDA TROPICALIS: NOT DETECTED
Candida krusei: NOT DETECTED
ENTEROBACTER CLOACAE COMPLEX: NOT DETECTED
ENTEROBACTERIACEAE SPECIES: NOT DETECTED
Enterococcus species: NOT DETECTED
Escherichia coli: NOT DETECTED
HAEMOPHILUS INFLUENZAE: NOT DETECTED
KLEBSIELLA OXYTOCA: NOT DETECTED
KLEBSIELLA PNEUMONIAE: NOT DETECTED
Listeria monocytogenes: NOT DETECTED
Neisseria meningitidis: NOT DETECTED
PROTEUS SPECIES: NOT DETECTED
Pseudomonas aeruginosa: NOT DETECTED
STAPHYLOCOCCUS SPECIES: NOT DETECTED
Serratia marcescens: NOT DETECTED
Staphylococcus aureus (BCID): NOT DETECTED
Streptococcus agalactiae: NOT DETECTED
Streptococcus pneumoniae: DETECTED — AB
Streptococcus pyogenes: NOT DETECTED
Streptococcus species: DETECTED — AB

## 2017-10-01 LAB — HEPATIC FUNCTION PANEL
ALBUMIN: 2.6 g/dL — AB (ref 3.5–5.0)
ALT: 1251 U/L — ABNORMAL HIGH (ref 17–63)
AST: 1378 U/L — ABNORMAL HIGH (ref 15–41)
Alkaline Phosphatase: 123 U/L (ref 38–126)
Bilirubin, Direct: 1.3 mg/dL — ABNORMAL HIGH (ref 0.1–0.5)
Indirect Bilirubin: 1.2 mg/dL — ABNORMAL HIGH (ref 0.3–0.9)
TOTAL PROTEIN: 5.9 g/dL — AB (ref 6.5–8.1)
Total Bilirubin: 2.5 mg/dL — ABNORMAL HIGH (ref 0.3–1.2)

## 2017-10-01 LAB — ECHOCARDIOGRAM COMPLETE
AOASC: 37 cm
AVPHT: 732 ms
Area-P 1/2: 6.29 cm2
CHL CUP MV DEC (S): 141
CHL CUP RV SYS PRESS: 51 mmHg
EWDT: 141 ms
FS: 2 % — AB (ref 28–44)
HEIGHTINCHES: 70 in
IV/PV OW: 0.81
LA diam index: 2.78 cm/m2
LA vol index: 115.5 mL/m2
LA vol: 237 mL
LASIZE: 57 mm
LAVOLA4C: 256 mL
LEFT ATRIUM END SYS DIAM: 57 mm
LV SIMPSON'S DISK: 2
LV sys vol index: 128 mL/m2
LV sys vol: 262 mL — AB (ref 21–61)
LVDIAVOL: 268 mL — AB (ref 62–150)
LVDIAVOLIN: 131 mL/m2
LVOT VTI: 5.64 cm
LVOT area: 4.15 cm2
LVOT diameter: 23 mm
LVOT peak grad rest: 1 mmHg
LVOT peak vel: 48.5 cm/s
LVOTSV: 23 mL
MRPISAEROA: 0.33 cm2
MV Peak grad: 3 mmHg
MVPKAVEL: 53.6 m/s
MVPKEVEL: 81.7 m/s
P 1/2 time: 41 ms
PW: 16 mm — AB (ref 0.6–1.1)
Reg peak vel: 301 cm/s
Stroke v: 6 ml
TAPSE: 11.3 mm
TRMAXVEL: 301 cm/s
VTI: 124 cm
Weight: 2966.51 oz

## 2017-10-01 LAB — COOXEMETRY PANEL
Carboxyhemoglobin: 0.8 % (ref 0.5–1.5)
Carboxyhemoglobin: 1.3 % (ref 0.5–1.5)
METHEMOGLOBIN: 0.9 % (ref 0.0–1.5)
Methemoglobin: 1 % (ref 0.0–1.5)
O2 Saturation: 53.8 %
O2 Saturation: 58.3 %
TOTAL HEMOGLOBIN: 12.9 g/dL (ref 12.0–16.0)
Total hemoglobin: 13.2 g/dL (ref 12.0–16.0)

## 2017-10-01 LAB — BASIC METABOLIC PANEL
ANION GAP: 24 — AB (ref 5–15)
BUN: 128 mg/dL — ABNORMAL HIGH (ref 6–20)
CHLORIDE: 88 mmol/L — AB (ref 101–111)
CO2: 17 mmol/L — ABNORMAL LOW (ref 22–32)
CREATININE: 2.73 mg/dL — AB (ref 0.61–1.24)
Calcium: 7.7 mg/dL — ABNORMAL LOW (ref 8.9–10.3)
GFR calc Af Amer: 26 mL/min — ABNORMAL LOW (ref >60)
GFR calc non Af Amer: 23 mL/min — ABNORMAL LOW (ref >60)
Glucose, Bld: 174 mg/dL — ABNORMAL HIGH (ref 65–99)
POTASSIUM: 4.3 mmol/L (ref 3.5–5.1)
SODIUM: 129 mmol/L — AB (ref 135–145)

## 2017-10-01 LAB — STREP PNEUMONIAE URINARY ANTIGEN: Strep Pneumo Urinary Antigen: NEGATIVE

## 2017-10-01 LAB — CBC
HCT: 38.6 % — ABNORMAL LOW (ref 39.0–52.0)
Hemoglobin: 13.2 g/dL (ref 13.0–17.0)
MCH: 32.1 pg (ref 26.0–34.0)
MCHC: 34.2 g/dL (ref 30.0–36.0)
MCV: 93.9 fL (ref 78.0–100.0)
PLATELETS: 402 10*3/uL — AB (ref 150–400)
RBC: 4.11 MIL/uL — AB (ref 4.22–5.81)
RDW: 13.9 % (ref 11.5–15.5)
WBC: 15.1 10*3/uL — AB (ref 4.0–10.5)

## 2017-10-01 LAB — HEPARIN LEVEL (UNFRACTIONATED): Heparin Unfractionated: <0.1 IU/mL — ABNORMAL LOW (ref 0.30–0.70)

## 2017-10-01 LAB — HIV ANTIBODY (ROUTINE TESTING W REFLEX): HIV Screen 4th Generation wRfx: NONREACTIVE

## 2017-10-01 LAB — PROCALCITONIN: PROCALCITONIN: 3.7 ng/mL

## 2017-10-01 MED ORDER — ALPRAZOLAM 0.25 MG PO TABS
0.2500 mg | ORAL_TABLET | Freq: Two times a day (BID) | ORAL | Status: DC | PRN
  Administered 2017-10-01 – 2017-10-05 (×3): 0.25 mg via ORAL
  Filled 2017-10-01 (×3): qty 1

## 2017-10-01 MED ORDER — HEPARIN (PORCINE) IN NACL 100-0.45 UNIT/ML-% IJ SOLN
1350.0000 [IU]/h | INTRAMUSCULAR | Status: DC
  Administered 2017-10-01: 1350 [IU]/h via INTRAVENOUS

## 2017-10-01 MED ORDER — SODIUM POLYSTYRENE SULFONATE 15 GM/60ML PO SUSP
60.0000 g | Freq: Once | ORAL | Status: DC
  Filled 2017-10-01: qty 240

## 2017-10-01 MED ORDER — FUROSEMIDE 10 MG/ML IJ SOLN
120.0000 mg | Freq: Three times a day (TID) | INTRAVENOUS | Status: DC
  Administered 2017-10-01 – 2017-10-02 (×3): 120 mg via INTRAVENOUS
  Filled 2017-10-01: qty 12
  Filled 2017-10-01: qty 10
  Filled 2017-10-01: qty 2
  Filled 2017-10-01: qty 10

## 2017-10-01 MED ORDER — NOREPINEPHRINE BITARTRATE 1 MG/ML IV SOLN
0.0000 ug/min | INTRAVENOUS | Status: DC
  Filled 2017-10-01: qty 4

## 2017-10-01 MED ORDER — MILRINONE LACTATE IN DEXTROSE 20-5 MG/100ML-% IV SOLN
0.5000 ug/kg/min | INTRAVENOUS | Status: DC
  Administered 2017-10-01 – 2017-10-03 (×3): 0.125 ug/kg/min via INTRAVENOUS
  Administered 2017-10-04: 0.25 ug/kg/min via INTRAVENOUS
  Administered 2017-10-05 – 2017-10-07 (×7): 0.375 ug/kg/min via INTRAVENOUS
  Administered 2017-10-08: 0.5 ug/kg/min via INTRAVENOUS
  Administered 2017-10-08: 0.375 ug/kg/min via INTRAVENOUS
  Filled 2017-10-01: qty 200
  Filled 2017-10-01 (×15): qty 100

## 2017-10-01 MED ORDER — SODIUM POLYSTYRENE SULFONATE 15 GM/60ML PO SUSP: 15.0000 g | Freq: Once | ORAL | Status: DC

## 2017-10-01 MED ORDER — HEPARIN (PORCINE) IN NACL 100-0.45 UNIT/ML-% IJ SOLN
2100.0000 [IU]/h | INTRAMUSCULAR | Status: DC
  Administered 2017-10-01: 1550 [IU]/h via INTRAVENOUS
  Administered 2017-10-02: 1750 [IU]/h via INTRAVENOUS
  Administered 2017-10-02 – 2017-10-03 (×2): 1950 [IU]/h via INTRAVENOUS
  Administered 2017-10-04: 1800 [IU]/h via INTRAVENOUS
  Administered 2017-10-04: 1950 [IU]/h via INTRAVENOUS
  Administered 2017-10-05: 1800 [IU]/h via INTRAVENOUS
  Administered 2017-10-05: 1900 [IU]/h via INTRAVENOUS
  Administered 2017-10-06 – 2017-10-07 (×2): 1950 [IU]/h via INTRAVENOUS
  Administered 2017-10-07 – 2017-10-08 (×2): 2100 [IU]/h via INTRAVENOUS
  Filled 2017-10-01 (×11): qty 250

## 2017-10-01 MED ORDER — HEPARIN (PORCINE) IN NACL 100-0.45 UNIT/ML-% IJ SOLN: 1350.0000 [IU]/h | INTRAMUSCULAR | Status: DC

## 2017-10-01 MED ORDER — ORAL CARE MOUTH RINSE
15.0000 mL | Freq: Two times a day (BID) | OROMUCOSAL | Status: DC
  Administered 2017-10-01 – 2017-10-08 (×10): 15 mL via OROMUCOSAL

## 2017-10-01 MED ORDER — DEXTROSE 5 % IV SOLN
2.0000 g | INTRAVENOUS | Status: DC
  Administered 2017-10-01 – 2017-10-09 (×9): 2 g via INTRAVENOUS
  Filled 2017-10-01 (×9): qty 2

## 2017-10-01 MED ORDER — METOLAZONE 2.5 MG PO TABS: 2.5000 mg | ORAL_TABLET | Freq: Once | ORAL | Status: DC

## 2017-10-01 NOTE — Progress Notes (Addendum)
Advanced Heart Failure Rounding Note  PCP:  Primary Cardiologist: Maribella Kuna   Subjective:    Yesterday received 160 mg IV lasix. Urine output picking up. Creatinine 2.5>2.8, BUN remains very high.   AST/ALT > 1000.   Remains on amio drip 60 mg + heparin drip. HR in 110s-120, atrial fibrillation  Respiratory virus screen: Parainfluenza virus.  Bld CX- Strep pneumoniae--> IV rocephin.  CXR with RLL PNA.  PCT 3.7.   SOB with exertion. Says he can't get comfortable.  Still saying that he wants to go home then come back.    Objective:   Weight Range: 185 lb 6.5 oz (84.1 kg) Body mass index is 26.6 kg/m.   Vital Signs:   Temp:  [96.6 F (35.9 C)-98.2 F (36.8 C)] 97.6 F (36.4 C) (01/22 0500) Pulse Rate:  [25-137] 30 (01/22 0100) Resp:  [0-45] 24 (01/22 0630) BP: (75-140)/(39-106) 100/86 (01/22 0630) SpO2:  [76 %-100 %] 93 % (01/22 0400) Weight:  [180 lb (81.6 kg)-185 lb 6.5 oz (84.1 kg)] 185 lb 6.5 oz (84.1 kg) (01/22 0630) Last BM Date: Oct 30, 2017  Weight change: Filed Weights   10/30/2017 1303 10-30-2017 1700 10/01/17 0630  Weight: 180 lb (81.6 kg) 181 lb 14.1 oz (82.5 kg) 185 lb 6.5 oz (84.1 kg)    Intake/Output:   Intake/Output Summary (Last 24 hours) at 10/01/2017 1030 Last data filed at 10/01/2017 0617 Gross per 24 hour  Intake 2691.37 ml  Output 1261 ml  Net 1430.37 ml      Physical Exam    General:  Breathless talking. No resp difficulty HEENT: Normal Neck: Supple. JVP to jaw  . Carotids 2+ bilat; no bruits. No lymphadenopathy or thyromegaly appreciated. Cor: PMI nondisplaced. Irregular  rate & rhythm. No rubs, gallops or murmurs. Lungs: RLL decreased on 3 liters  Abdomen: Soft, nontender, nondistended. No hepatosplenomegaly. No bruits or masses. Good bowel sounds. Extremities: No cyanosis, clubbing, rash, edema Neuro: Alert & orientedx3, cranial nerves grossly intact. moves all 4 extremities w/o difficulty. Affect pleasant   Telemetry  A fib RVR  110s-120s (personally reviewed)  EKG    n/a  Labs    CBC Recent Labs    2017/10/30 1149  WBC 17.6*  HGB 14.9  HCT 43.1  MCV 96.9  PLT 472*   Basic Metabolic Panel Recent Labs    13/14/38 1206  30-Oct-2017 2251 10/01/17 0257  NA  --    < > 128* 128*  K  --    < > 5.7* 5.7*  CL  --    < > 91* 90*  CO2  --    < > 14* 14*  GLUCOSE  --    < > 198* 186*  BUN  --    < > 130* 133*  CREATININE  --    < > 2.77* 2.80*  CALCIUM  --    < > 8.2* 8.0*  MG 3.3*  --   --   --   PHOS  --   --  7.9* 8.2*   < > = values in this interval not displayed.   Liver Function Tests Recent Labs    2017-10-30 1712 10/30/17 2251 10/01/17 0257  AST 1,400*  --   --   ALT 1,157*  --   --   ALKPHOS 136*  --   --   BILITOT 3.2*  --   --   PROT 6.2*  --   --   ALBUMIN 2.8* 2.8* 2.8*   No  results for input(s): LIPASE, AMYLASE in the last 72 hours. Cardiac Enzymes No results for input(s): CKTOTAL, CKMB, CKMBINDEX, TROPONINI in the last 72 hours.  BNP: BNP (last 3 results) Recent Labs    12/13/16 0949 02/11/17 1049 09/27/2017 1218  BNP 369.2* 562.7* 2,485.9*    ProBNP (last 3 results) No results for input(s): PROBNP in the last 8760 hours.   D-Dimer No results for input(s): DDIMER in the last 72 hours. Hemoglobin A1C No results for input(s): HGBA1C in the last 72 hours. Fasting Lipid Panel No results for input(s): CHOL, HDL, LDLCALC, TRIG, CHOLHDL, LDLDIRECT in the last 72 hours. Thyroid Function Tests Recent Labs    09/18/2017 1218  TSH 2.407    Other results:   Imaging    US Renal  Result Date: 10/01/2017 CLINICAL DATA:  Acute kidney injury EXAM: RENAL / URINARY TRACT ULTRASOUND COMPLETE COMPARISON:  None. FINDINGS: Right Kidney: Length: 12.6 cm.  No hydronephrosis or mass. Left Kidney: Length: 12.1 cm. Slight increased cortical echogenicity. No hydronephrosis or mass Bladder: Bladder is nearly empty Incidental note made of sludge filled gallbladder IMPRESSION: 1. Slight  increased cortical echogenicity of the left kidney. No hydronephrosis 2. Incidental note made of sludge filled gallbladder Electronically Signed   By: Jasmine Pang M.D.   On: 10/01/2017 01:25   Dg Chest Portable 1 View  Result Date: 09/18/2017 CLINICAL DATA:  Shortness of breath, tachycardia, nonproductive cough, and generalized weakness. EXAM: PORTABLE CHEST 1 VIEW COMPARISON:  07/02/2016 FINDINGS: The cardiac silhouette is moderately to prominently enlarged, accentuated compared to the prior study due to AP technique on the current examination. The thoracic aorta is tortuous. There is a hazy density in the right lung base which is new from the prior study. Prominence of the interstitial markings has decreased from the prior study. No pleural effusion or pneumothorax is identified. No acute osseous abnormality is seen. IMPRESSION: 1. Chronic cardiomegaly. Mild diffuse interstitial prominence, less than on the prior study though which may reflect very mild interstitial edema. 2. Focal hazy opacity in the right lung base which could reflect early pneumonia or alveolar edema. Electronically Signed   By: Sebastian Ache M.D.   On: 09/20/2017 12:19      Medications:     Scheduled Medications: . doxycycline  100 mg Oral Q12H  . furosemide  80 mg Intravenous Q8H  . sodium chloride flush  3 mL Intravenous Q12H     Infusions: . sodium chloride    . amiodarone 60 mg/hr (10/01/17 0434)  . cefTRIAXone (ROCEPHIN)  IV 2 g (10/01/17 0617)  . heparin 1,350 Units/hr (10/01/17 0400)  .  sodium bicarbonate  infusion 1000 mL 100 mL/hr at 10/01/17 0431     PRN Medications:  sodium chloride, acetaminophen, albuterol, ondansetron (ZOFRAN) IV, sodium chloride flush    Patient Profile  [name] is a 67 year old with a history of PAF S/P DC-CV 11/2016, s/p bilateral inguinal hernia repair 11/2017, PVCs, HTN, NICM, chronic systolic heart failure.   Sent from Urgent Care with A fib RVR. Acutely SOB on  arrival     Assessment/Plan   1. A fib RVR/PAF Had DV-CV 11/2016  HR better in 110s this morning  Continue amio 60 mg per hour + heparin drip.  Will need TEE/DCCV when improved, respiratory status not stable at this time.  2. ID  Afebrile WBC 17.6 on admit . CBC pending.  CXR  RLL pneumonia, PCT 3.7.  Lactic Acid 5.7>5.1   Blood CX-->Streptococcus Pneumoniae,  respiratory virus culture with parainfluenza virus.  On rocephin + doxycyline .   3. Acute/Chronic Systolic Heart Failure  Repeat ECHO today  Remains volume overloaded. Increase Lasix to 120 mg IV every 8 hrs and will give dose of metolazone.  No spiro,dig, arb for now with hyperkalemia.    4. Hyperkalemia Received Kayexyalate and calcium gluconate 1/21 Give 60 grams kayexalate now. BMET this afternoon and daily.  Stop HCO3 gtt for now (volume excess)  Todays K 5.7 now.   5. AKI  Creatinine on admit 2.5-->2.8  Renal US- no hydronephrosis.    6. Acute Hypoxic Respiratory Failure On admit sats in the 70s. Sats stable on 3 liters.   7. PVCs  On amio drip.  Follow K and Mag.   9. Elevated LFTs Repeat LFTs today.   Would benefit from central access for CO-OX. May need to add inotropes.    Medication concerns reviewed with patient and pharmacy team. Barriers identified: noncompliance.   Length of Stay: 1  Amy Clegg, NP  10/01/2017, 7:12 AM  Advanced Heart Failure Team Pager [phone] (M-F; 7a - 4p)  Please contact [facility] for night-coverage after hours (4p -7a ) and weekends on [email or web address]  Patient seen with NP, agree with the above note.  He has been "sick" for at least 3 weeks, started out with a flu-like illness and then became tachycardic and more and more short of breath.  He has not taken any of his cardiac meds sinc 4/18.  CXR shows mild pulmonary edema + possible right base PNA.  This morning, he remains in atrial fibrillation rate 110s-120s, SBP 100s.  BUN/creatinine elevated, AST/ALT >  1000.  PCT 3.7.  Strep pneumo in blood cultures and parainfluenzae virus in respiratory virus scan.  He remains short of breath/tachypneic.  UOP starting to pick up with IV Lasix.  JVP to jaw, crackles bilaterally on lung exam, heart irreg S1S2, tachy with 2/6 HSM apex.     1. Acute hypoxemic respiratory failure:  RLL PNA, Strep pneumoniae in blood cultures.  Respiratory cultures with parainfluenzae virus.  Also acute/chronic systolic CHF. CCM following.  - Covering with ceftriaxone/doxycycline.  - UOP picking up, Lasix 120 mg IV every 8 hrs and add metolazone 2.5 before next Lasix dose.  - With work of breathing, I am concerned that he will need to be intubated.  I think that he would allow this, but given his current thinking (wants to go home for 30 minutes then come back to hospital repetitively), wife likely needs to make decisions.  1. Acute on chronic systolic CHF: Nonischemic cardiomyopathy. 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 Etiology of his CHF is not clear =>no definite inciting event. He has a history of HTN but doubt this was the only trigger. 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 This would ideally be confirmed by cMRI, but he has not wanted an MRI (concerned about side effects). He also has frequent PVCs, 21% total on last holter in 4/18 which is a risk for fall in EF.No family history of CMP. Cannot rule out viral myocarditis. SPEP negative. With medical management, he initially felt a lot better. However, he quit all his meds in early 2018 with recurrence of NYHA III symptoms and onset of atrial fibrillation. He had TEE-DCCV in 3/18. TEE showed that EF remained 25%. He quit his meds again in 4/18 and apparently did not restart them when I asked him to in  6/18.  No meds probably since 4/18.  On exam today, he remains markedly volume overloaded and tachypneic. BP is stable so far. He has AKI  with BUN/creatinine markedly above baseline, possibly cardiorenal (not sure how long he has been in marked RVR with his atrial fibrillation).  I am concerned for low output HF.  - He needs central line placed, would like to follow co-ox/CVP.  If co-ox low, now that HR is better controlled, we could use low dose milrinone and/or norepinephrine.  Will ask CCM to place today.   - Will try to diurese with Lasix 120 mg IV every 8 hrs, add metolazone 2.5 x 1 later today.  UOP starting to pick up.  -Slow HR with amiodarone gtt.  3. 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 It is possible that the PVCs contribute to his cardiomyopathy.  - On amiodarone.  4. Atrial fibrillation: Persistent, now with RVR.  HR up to 140s initially, now 110s-120 on amiodarone gtt at 60.  Not sure how long he has been in atrial fibrillation with marked RVR, but this may have triggered his CHF and AKI due to worsening of cardiac output.  - Need to slow HR, continue amiodarone gtt at 60 for now.  - On heparin gtt for anticoagulation with AKI.  - Will need eventual TEE-guided DCCV but respiratory status not stable for TEE-DCCV currently.  If he is intubated, we would be able to do the procedure.  5. AKI: Creatinine 1.08 in 6/18, he has not been on any meds.  Suspect cardiorenal syndrome with afib/RVR and fall in cardiac output. He has been hyperkalemic and acidotic.    - BUN/creatinine stably elevated this morning, K trending down (4.2).    - Clearly volume overloaded, will try to slow HR and diurese as above.  - Nephrology following.  6. ID: Strep pneumo PNA as well as parainfluenza virus.  - Ceftriaxone/doxycycline  7. Elevated LFTs: Suspect shock liver type picture, AST/ALT > 1000 at admission.    Care in the past has been severely limited by noncompliance and resistance to medical recommendations.    CRITICAL CARE Performed by: Marca Ancona  Total critical care time: 45 minutes  Critical care time was  exclusive of separately billable procedures and treating other patients.  Critical care was necessary to treat or prevent imminent or life-threatening deterioration.  Critical care was time spent personally by me on the following activities: development of treatment plan with patient and/or surrogate as well as nursing, discussions with consultants, evaluation of patient's response to treatment, examination of patient, obtaining history from patient or surrogate, ordering and performing treatments and interventions, ordering and review of laboratory studies, ordering and review of radiographic studies, pulse oximetry and re-evaluation of patient's condition.  Marca Ancona 10/01/2017 8:02 AM

## 2017-10-01 NOTE — Progress Notes (Signed)
Subjective:  Stabilizing a little- HR down to 110's-120's on amio- making urine- on moderate lasix also given diuril by cards.  I think is making urine more due to increased kidney perfusion than due to meds  Objective Vital signs in last 24 hours: Vitals:   10/01/17 0500 10/01/17 0530 10/01/17 0600 10/01/17 0630  BP:  117/87 110/79 100/86  Pulse:      Resp:  (!) 35 (!) 32 (!) 24  Temp: 97.6 F (36.4 C)     TempSrc: Axillary     SpO2:      Weight:    84.1 kg (185 lb 6.5 oz)  Height:    5\' 10"  (1.778 m)   Weight change:   Intake/Output Summary (Last 24 hours) at 10/01/2017 0756 Last data filed at 10/01/2017 0617 Gross per 24 hour  Intake 2691.37 ml  Output 1261 ml  Net 1430.37 ml    Assessment/Plan: 67 year old white male with cardiomyopathy on no medications due to noncompliance -he now presents with A. fib and RVR in the setting of probable pneumonitis with decompensated heart failure and acute kidney injury 1.Renal-normal creatinine 7 months ago.  Now with acute kidney injury with BUN out of proportion to creatinine.  I would suspect is secondary to a hemodynamic cause-prolonged A. fib with decreased renal perfusion.  Efforts underway to slow heart rate and improve kidney perfusion.  renal ultrasound OK, no U/A in system, will order.  He currently has a condom catheter in place with much  increased urine output.  There are not absolute indications for dialysis - in better shape with lower K.  I would support him in the short-term with dialysis if needed-but past behavior would indicate that he is a very poor candidate for chronic dialysis therapy.  Much discussion would need to be held before that was undertaken.  The good news is that urine output is increased and his K is down. I think is making urine more due to increased kidney perfusion than due to meds 2. Hypertension/volume  -he is volume overloaded.  Decompensated heart failure secondary to A. fib.  Cardiology on board and efforts  underway to slow heart rate down- agree with lasix but not sure that thiazide diuretics are going to give much to this scenario- I have stopped the metolazone, think he will continue to aurtodiurese.  Hyponatremia is due to volume overload 3.  Uremia-unclear why BUN is so high out of proportion to the creatinine.  This normally signifies a GI bleed but his hemoglobin is high.  I am hopeful with just medicinal therapy and increased renal perfusion this will improve.  Over diuresis will make it worse 4. Anemia  -not an issue at the moment 5.  Hyperkalemia-has been given calcium, Kayexalate and low-dose bicarb drip at 50 cc an hour was started.  Serial potassiums.  Now down to normal- bicarb drip stopped and no further need for kaexylate 6.  Metabolic acidosis-low-dose bicarbonate drip- now has been stopped      Toddrick Sanna A    Labs: Basic Metabolic Panel: Recent Labs  Lab 10/10/2017 2251 10/01/17 0257 10/01/17 0652  NA 128* 128* 128*  K 5.7* 5.7* 4.2  CL 91* 90* 88*  CO2 14* 14* 18*  GLUCOSE 198* 186* 193*  BUN 130* 133* 132*  CREATININE 2.77* 2.80* 2.80*  CALCIUM 8.2* 8.0* 7.7*  PHOS 7.9* 8.2* 7.5*   Liver Function Tests: Recent Labs  Lab 09/23/2017 1712 09/17/2017 2251 10/01/17 0257 10/01/17 0652  AST 1,400*  --   --   --  ALT 1,157*  --   --   --   ALKPHOS 136*  --   --   --   BILITOT 3.2*  --   --   --   PROT 6.2*  --   --   --   ALBUMIN 2.8* 2.8* 2.8* 2.6*   No results for input(s): LIPASE, AMYLASE in the last 168 hours. No results for input(s): AMMONIA in the last 168 hours. CBC: Recent Labs  Lab 10-29-2017 1149  WBC 17.6*  HGB 14.9  HCT 43.1  MCV 96.9  PLT 472*   Cardiac Enzymes: No results for input(s): CKTOTAL, CKMB, CKMBINDEX, TROPONINI in the last 168 hours. CBG: No results for input(s): GLUCAP in the last 168 hours.  Iron Studies: No results for input(s): IRON, TIBC, TRANSFERRIN, FERRITIN in the last 72 hours. Studies/Results: US  Renal  Result Date: 10/01/2017 CLINICAL DATA:  Acute kidney injury EXAM: RENAL / URINARY TRACT ULTRASOUND COMPLETE COMPARISON:  None. FINDINGS: Right Kidney: Length: 12.6 cm.  No hydronephrosis or mass. Left Kidney: Length: 12.1 cm. Slight increased cortical echogenicity. No hydronephrosis or mass Bladder: Bladder is nearly empty Incidental note made of sludge filled gallbladder IMPRESSION: 1. Slight increased cortical echogenicity of the left kidney. No hydronephrosis 2. Incidental note made of sludge filled gallbladder Electronically Signed   By: Jasmine Pang M.D.   On: 10/01/2017 01:25   Dg Chest Portable 1 View  Result Date: 10/29/17 CLINICAL DATA:  Shortness of breath, tachycardia, nonproductive cough, and generalized weakness. EXAM: PORTABLE CHEST 1 VIEW COMPARISON:  07/02/2016 FINDINGS: The cardiac silhouette is moderately to prominently enlarged, accentuated compared to the prior study due to AP technique on the current examination. The thoracic aorta is tortuous. There is a hazy density in the right lung base which is new from the prior study. Prominence of the interstitial markings has decreased from the prior study. No pleural effusion or pneumothorax is identified. No acute osseous abnormality is seen. IMPRESSION: 1. Chronic cardiomegaly. Mild diffuse interstitial prominence, less than on the prior study though which may reflect very mild interstitial edema. 2. Focal hazy opacity in the right lung base which could reflect early pneumonia or alveolar edema. Electronically Signed   By: Sebastian Ache M.D.   On: 10/29/2017 12:19   Medications: Infusions: . sodium chloride    . amiodarone 60 mg/hr (10/01/17 0434)  . cefTRIAXone (ROCEPHIN)  IV 2 g (10/01/17 0617)  . furosemide    . heparin 1,350 Units/hr (10/01/17 0400)    Scheduled Medications: . doxycycline  100 mg Oral Q12H  . metolazone  2.5 mg Oral Once  . sodium chloride flush  3 mL Intravenous Q12H  . sodium polystyrene  60 g Oral  Once    have reviewed scheduled and prn medications.  Physical Exam: General:looks slightly better- asking to go home- is thirsty Heart: tachy, irreg Lungs: mostly clear Abdomen: soft, non tender Extremities: pitting edema Dialysis Access: none - foley bag full    10/01/2017,7:56 AM  LOS: 1 day

## 2017-10-01 NOTE — Procedures (Signed)
Central Venous Catheter Insertion Procedure Note JOIE SLEE [id number] 19-May-1951  Procedure: Insertion of Central Venous Catheter Indications: Assessment of intravascular volume  Procedure Details Consent: Risks of procedure as well as the alternatives and risks of each were explained to the (patient/caregiver).  Consent for procedure obtained. Time Out: Verified patient identification, verified procedure, site/side was marked, verified correct patient position, special equipment/implants available, medications/allergies/relevent history reviewed, required imaging and test results available.  Performed  Maximum sterile technique was used including antiseptics, cap, gloves, gown, hand hygiene, mask and sheet. Skin prep: Chlorhexidine; local anesthetic administered A antimicrobial bonded/coated triple lumen catheter was placed in the right subclavian vein using the Seldinger technique.  Evaluation Blood flow good Complications: No apparent complications Patient did tolerate procedure well. Chest X-ray ordered to verify placement.  CXR: pending.  Simonne Martinet ACNP-BC [facility] Pulmonary/Critical Care Pager # [phone] OR # [phone] if no answer  Alyson Reedy, M.D. [facility] Pulmonary/Critical Care Medicine. Pager: [phone]. After hours pager: [phone].

## 2017-10-01 NOTE — Progress Notes (Signed)
   Central Line placed.   CO-OX 54%.  CVPp 16.   Add 0.125 mcg milrinone. If SBP < 85 will add Norepi.   Follow closely. Remains tenuous.   Amy Clegg NP-C  2:35 PM

## 2017-10-01 NOTE — Plan of Care (Signed)
  Clinical Measurements: Cardiovascular complication will be avoided 10/01/2017 8786 - Not Progressing by Seleta Rhymes, Alinda Money, RN   Activity: Risk for activity intolerance will decrease 10/01/2017 0334 - Not Progressing by Seleta Rhymes, Alinda Money, RN   Nutrition: Adequate nutrition will be maintained 10/01/2017 0334 - Not Progressing by Seleta Rhymes, Alinda Money, RN   Elimination: Will not experience complications related to bowel motility 10/01/2017 0334 - Adequate for Discharge by Cecile Sheerer, RN Will not experience complications related to urinary retention 10/01/2017 0334 - Not Progressing by Cecile Sheerer, RN   Elimination: Will not experience complications related to urinary retention 10/01/2017 0334 - Not Progressing by Cecile Sheerer, RN

## 2017-10-01 NOTE — Progress Notes (Addendum)
PULMONARY / CRITICAL CARE MEDICINE   Name: Bryan Harmon MRN: [id number] DOB: 04/13/1951    ADMISSION DATE:  09/18/2017  CHIEF COMPLAINT: extreme fatigue  HISTORY OF PRESENT ILLNESS:        This is a 67 year old with a nonischemic cardiomyopathy and a last known ejection fraction of 20-25% associated with significant mitral regurgitation.  He also has a history of intermittent atrial fibrillation.  He presented this morning with complaints of extreme fatigue and he was found to be in atrial fibrillation with a rapid ventricular response.  In addition there is a subtle suggestion of a right lower lobe infiltrate on his chest x-ray.  Of note he is not aware when he is in atrial fibrillation.  He has not had any sense of palpitations or syncope.  He traces the beginning of his fatigue to approximately 8 days ago when he was suffering from watery diarrhea and intermittent subjective fever.  There was no blood in the stool and there was no associated abdominal pain.  He has not been taking any of his medications.  He does not feel as though his weight has increased or his dependent edema is greater than usual.  He is not suffering from more than usual orthopnea.  Pertinent to the subtle right lower lobe infiltrate he has not had any cough. Laboratory studies have shown an elevated lactic acid and elevated white count.  He also has had a significant rise in his creatinine from baseline and is hyperkalemic with a bicarbonate of only 12  Treatment in the department of emergency medicine has included an amiodarone infusion, doxycycline, Rocephin, Kayexalate, and a heparin infusion  SUBJECTIVE:  Awake and alert, on 3 L [place] with sats of 98%, RR 22. He states his breathing is better after xanax this am.He appears calm  VITAL SIGNS: BP 111/78   Pulse (!) 54   Temp 98 F (36.7 C) (Oral)   Resp 16   Ht 5\' 10"  (1.778 m)   Wt 185 lb 6.5 oz (84.1 kg)   SpO2 97%   BMI 26.60 kg/m   HEMODYNAMICS:     VENTILATOR SETTINGS:    INTAKE / OUTPUT: I/O last 3 completed shifts: In: 2741.4 [P.O.:640; I.V.:1891.4; IV Piggyback:210] Out: 1261 [Urine:1260; Stool:1]  PHYSICAL EXAMINATION: General: He is thin, chronically ill-appearing, and somewhat tachyphemic wearing [place] at 3 L with sats of 98%. Neuro: Awake and alert, MAE x 4, Follows commands. Cardiovascular: S1 and S2 are irregularly irregular without murmur rub or gallop He has 1+ lower extremity edema and + JVD.  Limbs are cool to touch, no mottling. Lungs: RR 22,Bilateral chest excursion, Few scattered rhonchi, diminished per bases Abdomen: The abdomen is flat and soft without any organomegaly masses or tenderness.  He is anicteric. Skin: Warm dry and intact, cool to touch  LABS:  BMET Recent Labs  Lab 10/06/2017 2251 10/01/17 0257 10/01/17 0652  NA 128* 128* 128*  K 5.7* 5.7* 4.2  CL 91* 90* 88*  CO2 14* 14* 18*  BUN 130* 133* 132*  CREATININE 2.77* 2.80* 2.80*  GLUCOSE 198* 186* 193*    Electrolytes Recent Labs  Lab 09/12/2017 1206  09/15/2017 2251 10/01/17 0257 10/01/17 0652  CALCIUM  --    < > 8.2* 8.0* 7.7*  MG 3.3*  --   --   --   --   PHOS  --   --  7.9* 8.2* 7.5*   < > = values in this interval not displayed.  CBC Recent Labs  Lab 10/02/2017 1149 10/01/17 0816  WBC 17.6* 15.1*  HGB 14.9 13.2  HCT 43.1 38.6*  PLT 472* 402*    Coag's No results for input(s): APTT, INR in the last 168 hours.  Sepsis Markers Recent Labs  Lab 09/16/2017 1330 09/18/2017 1712 10/01/17 0257  LATICACIDVEN 5.74* 5.1*  --   PROCALCITON  --  3.38 3.70    ABG Recent Labs  Lab 09/27/2017 2308  PHART 7.352  PCO2ART 28.4*  PO2ART 101.0    Liver Enzymes Recent Labs  Lab 09/18/2017 1712  10/01/17 0257 10/01/17 0652 10/01/17 0816  AST 1,400*  --   --   --  1,378*  ALT 1,157*  --   --   --  1,251*  ALKPHOS 136*  --   --   --  123  BILITOT 3.2*  --   --   --  2.5*  ALBUMIN 2.8*   < > 2.8* 2.6* 2.6*   < > = values in  this interval not displayed.    Cardiac Enzymes No results for input(s): TROPONINI, PROBNP in the last 168 hours.  Glucose No results for input(s): GLUCAP in the last 168 hours.  Imaging US Renal  Result Date: 10/01/2017 CLINICAL DATA:  Acute kidney injury EXAM: RENAL / URINARY TRACT ULTRASOUND COMPLETE COMPARISON:  None. FINDINGS: Right Kidney: Length: 12.6 cm.  No hydronephrosis or mass. Left Kidney: Length: 12.1 cm. Slight increased cortical echogenicity. No hydronephrosis or mass Bladder: Bladder is nearly empty Incidental note made of sludge filled gallbladder IMPRESSION: 1. Slight increased cortical echogenicity of the left kidney. No hydronephrosis 2. Incidental note made of sludge filled gallbladder Electronically Signed   By: Jasmine Pang M.D.   On: 10/01/2017 01:25   Dg Chest Port 1 View  Result Date: 10/01/2017 CLINICAL DATA:  Pneumonia. EXAM: PORTABLE CHEST 1 VIEW COMPARISON:  10/10/2017 FINDINGS: Marked cardiac enlargement is stable. Increased diffuse interstitial infiltrates are seen, with increased airspace disease at right lung base. No definite pleural effusion visualized. IMPRESSION: Increased diffuse interstitial infiltrates, with airspace disease in right lung base. Stable cardiac enlargement. Electronically Signed   By: Myles Rosenthal M.D.   On: 10/01/2017 08:05   Dg Chest Portable 1 View  Result Date: 09/17/2017 CLINICAL DATA:  Shortness of breath, tachycardia, nonproductive cough, and generalized weakness. EXAM: PORTABLE CHEST 1 VIEW COMPARISON:  07/02/2016 FINDINGS: The cardiac silhouette is moderately to prominently enlarged, accentuated compared to the prior study due to AP technique on the current examination. The thoracic aorta is tortuous. There is a hazy density in the right lung base which is new from the prior study. Prominence of the interstitial markings has decreased from the prior study. No pleural effusion or pneumothorax is identified. No acute osseous  abnormality is seen. IMPRESSION: 1. Chronic cardiomegaly. Mild diffuse interstitial prominence, less than on the prior study though which may reflect very mild interstitial edema. 2. Focal hazy opacity in the right lung base which could reflect early pneumonia or alveolar edema. Electronically Signed   By: Sebastian Ache M.D.   On: 09/26/2017 12:19   Lines/ Tubes 1/22 CVC  Cultures: 1/21 Blood>>Strep Pneumoniae 1/21 Resp Viral Panel>> + Parainfluenza Virus 3  ABX:  1/21>> Rocephin 1/21>> Doxy  Studies: 10/01/2017>> Echo EF 15-20%, Apex ? PAP muscle vs. Thrombus, LV severely dilated, moderate concentric hypertrophy, systolic function normal, unable to evaluate LV diastolic function due to atrial fibrillation. + moderate spontaneous echo contrast, indicative of stasis Impression Severely dilated LV  with severe LV dysfunction globally with EF   15-20%. Severely dialted RV with severe RV dysfunction. Moderate   to severe MR with ERO 0.33cm2 and MR volume 41ml. Moderate to   severe TR with moderate pulmonary HTN. Moderately thickened and   calcified AV leaflets with mild MR, mildly dilated aortic root,   massive biatrial enlargement. Cannot rule out LV thrombus. There   is significant spontaneous echo contrast in LV c/w sluggish blood   flow. The right ventricular systolic pressure was increased   consistent with moderate pulmonary hypertension.  DISCUSSION: This is a 67 year old with a cardiomyopathy who presents with extreme fatigue.  He is found to be in atrial fibrillation with a rapid ventricular response.  He reports a recent history of watery diarrhea and has had a bump in his creatinine and developed a non-gap acidosis with an elevated potassium.  He also has a significantly elevated lactic acid.  If not for his leukocytosis, we  would attribute his constellation of findings entirely to a low cardiac output state related to dehydration from his diarrhea and atrial fibrillation with a  rapid ventricular response.   ASSESSMENT / PLAN:  PULMONARY  A: Acute Respiratory Failure 2/2 Acute on Chronic HF/ ? Infiltrates per CXR chest x-ray findings of an infiltrate at the right base , subtle.   empirically covered with a combination of Rocephin and doxycycline Pulmonary Edema CXR 1/22>> Increased diffuse interstitial infiltrates, with airspace disease in right lung base.  Pt. Spoke with Dr. [name] this am and has agreed to intubation if necessary.  Plan: Titrate oxygen for sats > 94%  Lasix per cards CXR 1/23 , and trend daily Consider BiPAP if WOB worsens>> has agreed to intubation if needed. ABG prn ABX as above Low dose xanax for tachypnea Consider Sputum Cx if intubated    CARDIOVASCULAR A:  Acute on Chronic Systolic Heart Failure in patient who had stopped taking his meds Lactic/ metabolic  acidosis and fatigue  2/2 decreased CO   Volume overload EF per echo 1/22 is 15 - 20% with ?  LV thrombus  vs papillary muscle Atrial Fibtillation Amio gtt at 60 mg/hr Heparin gtt Consider TEE if respiratory stabilizes/ or intubated  Lactate 5.1 on 1/21 + 1.4 L last 24   P: Tele monitoring Map Goal > 65 No spiro,dig, arb for now with hyperkalemia.  Lasix 120 Q 8 per cards Continue drips as above Central line placement 1/22 for Co Ox monitoring Plan to start inotropes per cards to improve pump function Tend Lactate prn   RENAL A:  Acute renal failure 2/2 decompensated HF,   A fib / Poor renal perfusion Creatinine is more than doubled since it was last checked and found to be 1.08.  BUN Creat ratio out of proportion, but HGB ok ( Doubt UGI bleed)   Metabolic acidosis>> HCO3 gtt off 1/22 am  Hyperkalemia>> Tx 1/22 with Ca, Kayexelate , HCO3 gtt @ 50 cc/ hr>> now off Hyponatremia 2/2 fluid overload Uremia >> Now auto diuresing Poor chronic HD candidate per renal Would consider bridging therapy if necessary Plan: Gentle HCO3 gtt @ 50 cc /hr now off  1/22 Renal US to RO obstruction Maintain renal perfusion with MAP goal > 65 Avoid nephrotoxic drugs Trend CMET daily Replete electrolytes daily Strict I&O Fluid restriction Rest per renal  Hematology A: Chronic Anemia Heparin gtt for thrombus and fib Plan: Trend CBC  Monitor for blood loss DVT prophylaxis: Heparin gtt.  GI Elevated LFT's Plan:  Repeat LFT's 1/22 Clear liquids / fluid restriction NPO if respiratory status worsens   INFECTIOUS A:  Afebrile Leukocytosis Worsening infiltrates per CXR 1/22  BC positive for Strep Pneumoniae Resp Viral Panel>> + Parainfluenza Virus 3 PCT 3.70  Plan: Continue Rocephin and doxycycline for  right lower lobe infiltrate . Consider bowel ischemia as potential but doubtful cause of leukocytosis/ diarrhea  Follow Cultures / Micro Trend WBC/ Fever Curve Re-culture as is clinically indicated Trend PCT Follow CXR   Endocrine No Acute issues Plan: CBG Q 4 SSI    Neuro A:  Awake and alert Following Commands Plan: Neuro Checks per unit routine Minimize sedation   Psych A: Anxiety ? Depression/ ? Reason stopped taking medications Plan: Low Dose Xanax for anxiety>> tachypnea, but minimize sedation Consider anti depressant   FAMILY   Wife updated at bedside 10/01/2017 am. She is aware patient has changed his mind about intubation after taking with Dr. [name]. She understands a central line will be placed to allow for better monitoring to guide medication titration to improve heart function.   Bevelyn Ngo, AGACNP-BC Pulmonary and Critical Care Medicine [facility] Pager: [phone]  10/01/2017, 10:24 AM  Attending Note:  67 year old male with extensive cardiac history who skipped his cardiac medications and presents with an acute exacerbation of his CHF, acute pulmonary edema, acute renal failure and cardiogenic shock.  On exam, bibasilar crackles noted.  I reviewed CXR myself, pulmonary edema  noted with severe cardiomyopathy.  Discussed with PCCM-NP.  Will titrate O2 for sat of 88-92%.  TLC to be placed today to guide fluid management.  Appreciate input from renal  Will continue aggressive treatment with O2, lasix and resumption of his CHF medications.  Strep pneumonia on rapid, doxy and rocephin to continue for now.  Code status back to full.  PCCM will continue to follow.  The patient is critically ill with multiple organ systems failure and requires high complexity decision making for assessment and support, frequent evaluation and titration of therapies, application of advanced monitoring technologies and extensive interpretation of multiple databases.   Critical Care Time devoted to patient care services described in this note is  35  Minutes. This time reflects time of care of this signee Dr [name]. This critical care time does not reflect procedure time, or teaching time or supervisory time of PA/NP/Med student/Med Resident etc but could involve care discussion time.  Alyson Reedy, M.D. [facility] Pulmonary/Critical Care Medicine. Pager: [phone]. After hours pager: [phone].

## 2017-10-01 NOTE — Progress Notes (Signed)
  Echocardiogram 2D Echocardiogram has been performed.  Janalyn Harder 10/01/2017, 10:41 AM

## 2017-10-01 NOTE — Progress Notes (Signed)
Dr Vonzella Nipple called about patients work of breathing. Orders received and executed. Patient refused to go on bipap. He is a DNI

## 2017-10-01 NOTE — Progress Notes (Signed)
ANTICOAGULATION CONSULT NOTE   Pharmacy Consult for Heparin  Indication: atrial fibrillation  No Known Allergies  Vital Signs: Temp: 97.5 F (36.4 C) (01/22 1900) Temp Source: Oral (01/22 1900) BP: 107/86 (01/22 2100) Pulse Rate: 51 (01/22 2100)  Labs: Recent Labs    Oct 19, 2017 1149  10/19/2017 2251 10/01/17 0257 10/01/17 9702 10/01/17 0816 10/01/17 2052  HGB 14.9  --   --   --   --  13.2  --   HCT 43.1  --   --   --   --  38.6*  --   PLT 472*  --   --   --   --  402*  --   HEPARINUNFRC  --   --  <0.10*  --   --  <0.10* <0.10*  CREATININE 2.52*   < > 2.77* 2.80* 2.80* 2.73*  --    < > = values in this interval not displayed.    Estimated Creatinine Clearance: 27.9 mL/min (A) (by C-G formula based on SCr of 2.73 mg/dL (H)).   Medical History: Past Medical History:  Diagnosis Date  . Hypertension   . Inguinal hernia, bilateral    Assessment: 66yom with EF 25% known medication non-compliance.  Seen in ED acutely SOB volume overloaded > furosemide iv and Afib RVR start amiodarone drip.  Complicated with AKI Cr 2.5 K 6.2.  WBC up 17 CXR RLL opacity > start doxy and rocephin.  1/22 PM: heparin level still undetectable.  No overt bleeding or complications noted.  Goal of Therapy:  Heparin level 0.3-0.7 units/ml Monitor platelets by anticoagulation protocol: Yes   Plan:  Increase IV heparin to 1550 units/hr. Recheck heparin level in 8 hrs. Daily heparin level and CBC  Tad Moore BCPS  Clinical Pharmacist Pager [phone]  10/01/2017 9:45 PM '

## 2017-10-01 NOTE — Progress Notes (Signed)
ANTICOAGULATION CONSULT NOTE   Pharmacy Consult for Heparin  Indication: atrial fibrillation  No Known Allergies  Vital Signs: Temp: 97.9 F (36.6 C) (01/21 2300) Temp Source: Axillary (01/21 2300) BP: 103/50 (01/21 2330) Pulse Rate: 44 (01/21 2330)  Labs: Recent Labs    09/21/2017 1149 09/25/2017 1712 10/08/2017 2251  HGB 14.9  --   --   HCT 43.1  --   --   PLT 472*  --   --   HEPARINUNFRC  --   --  <0.10*  CREATININE 2.52* 2.50* 2.77*    CrCl cannot be calculated (Unknown ideal weight.).   Medical History: Past Medical History:  Diagnosis Date  . Hypertension   . Inguinal hernia, bilateral    Assessment: 66yom with EF 25% known medication non-compliance.  Seen in ED acutely SOB volume overloaded > furosemide iv and Afib RVR start amiodarone drip.  Complicated with AKI Cr 2.5 K 6.2.  WBC up 17 CXR RLL opacity > start doxy and rocephin.  1/22 AM: heparin level sub-therapeutic, no issues per RN.   Goal of Therapy:  Heparin level 0.3-0.7 units/ml Monitor platelets by anticoagulation protocol: Yes   Plan:  Inc heparin to 1350 units/hr 0900 HL  [name], PharmD, BCPS Clinical Pharmacist Phone: [phone]

## 2017-10-01 NOTE — Progress Notes (Signed)
Dr Vonzella Nipple of cardiology called about patients work of breathing and morning labs. No new orders received.

## 2017-10-01 NOTE — Progress Notes (Signed)
PHARMACY - PHYSICIAN COMMUNICATION CRITICAL VALUE ALERT - BLOOD CULTURE IDENTIFICATION (BCID)  Bryan Harmon is an 67 y.o. male who presented to [facility] on 10-Oct-2017   Assessment:  Suspected PNA  Name of physician (or Provider) Contacted: Dr. Vonzella Nipple  Current antibiotics: Ceftriaxone/Doxycycline   Changes to prescribed antibiotics recommended:  -Will increase ceftriaxone dose to 2g IV q24h for bacteremia coverage  Results for orders placed or performed during the hospital encounter of 10-10-17  Blood Culture ID Panel (Reflexed) (Collected: 2017/10/10  1:08 PM)  Result Value Ref Range   Enterococcus species NOT DETECTED NOT DETECTED   Listeria monocytogenes NOT DETECTED NOT DETECTED   Staphylococcus species NOT DETECTED NOT DETECTED   Staphylococcus aureus NOT DETECTED NOT DETECTED   Streptococcus species DETECTED (A) NOT DETECTED   Streptococcus agalactiae NOT DETECTED NOT DETECTED   Streptococcus pneumoniae DETECTED (A) NOT DETECTED   Streptococcus pyogenes NOT DETECTED NOT DETECTED   Acinetobacter baumannii NOT DETECTED NOT DETECTED   Enterobacteriaceae species NOT DETECTED NOT DETECTED   Enterobacter cloacae complex NOT DETECTED NOT DETECTED   Escherichia coli NOT DETECTED NOT DETECTED   Klebsiella oxytoca NOT DETECTED NOT DETECTED   Klebsiella pneumoniae NOT DETECTED NOT DETECTED   Proteus species NOT DETECTED NOT DETECTED   Serratia marcescens NOT DETECTED NOT DETECTED   Haemophilus influenzae NOT DETECTED NOT DETECTED   Neisseria meningitidis NOT DETECTED NOT DETECTED   Pseudomonas aeruginosa NOT DETECTED NOT DETECTED   Candida albicans NOT DETECTED NOT DETECTED   Candida glabrata NOT DETECTED NOT DETECTED   Candida krusei NOT DETECTED NOT DETECTED   Candida parapsilosis NOT DETECTED NOT DETECTED   Candida tropicalis NOT DETECTED NOT DETECTED    [name] 10/01/2017  5:46 AM

## 2017-10-02 ENCOUNTER — Inpatient Hospital Stay (HOSPITAL_COMMUNITY): Payer: Medicare Other

## 2017-10-02 DIAGNOSIS — R579 Shock, unspecified: Secondary | ICD-10-CM

## 2017-10-02 DIAGNOSIS — I5023 Acute on chronic systolic (congestive) heart failure: Secondary | ICD-10-CM

## 2017-10-02 LAB — BASIC METABOLIC PANEL
ANION GAP: 15 (ref 5–15)
Anion gap: 14 (ref 5–15)
BUN: 96 mg/dL — AB (ref 6–20)
BUN: 78 mg/dL — ABNORMAL HIGH (ref 6–20)
CALCIUM: 7.6 mg/dL — AB (ref 8.9–10.3)
CHLORIDE: 82 mmol/L — AB (ref 101–111)
CO2: 35 mmol/L — AB (ref 22–32)
CO2: 33 mmol/L — AB (ref 22–32)
CREATININE: 2.16 mg/dL — AB (ref 0.61–1.24)
CREATININE: 2.32 mg/dL — AB (ref 0.61–1.24)
Calcium: 7.7 mg/dL — ABNORMAL LOW (ref 8.9–10.3)
Chloride: 81 mmol/L — ABNORMAL LOW (ref 101–111)
GFR calc Af Amer: 35 mL/min — ABNORMAL LOW (ref >60)
GFR calc non Af Amer: 30 mL/min — ABNORMAL LOW (ref >60)
GFR calc non Af Amer: 28 mL/min — ABNORMAL LOW (ref >60)
GFR, EST AFRICAN AMERICAN: 32 mL/min — AB (ref >60)
Glucose, Bld: 143 mg/dL — ABNORMAL HIGH (ref 65–99)
Glucose, Bld: 165 mg/dL — ABNORMAL HIGH (ref 65–99)
POTASSIUM: 2.9 mmol/L — AB (ref 3.5–5.1)
Potassium: 2.7 mmol/L — CL (ref 3.5–5.1)
SODIUM: 131 mmol/L — AB (ref 135–145)
SODIUM: 129 mmol/L — AB (ref 135–145)

## 2017-10-02 LAB — COOXEMETRY PANEL
CARBOXYHEMOGLOBIN: 0.9 % (ref 0.5–1.5)
METHEMOGLOBIN: 1.1 % (ref 0.0–1.5)
O2 SAT: 62.5 %
Total hemoglobin: 12.9 g/dL (ref 12.0–16.0)

## 2017-10-02 LAB — ECHOCARDIOGRAM LIMITED
Height: 70 in
WEIGHTICAEL: 2811.31 [oz_av]

## 2017-10-02 LAB — HEPARIN LEVEL (UNFRACTIONATED)
HEPARIN UNFRACTIONATED: 0.17 [IU]/mL — AB (ref 0.30–0.70)
HEPARIN UNFRACTIONATED: 0.17 [IU]/mL — AB (ref 0.30–0.70)
HEPARIN UNFRACTIONATED: 0.34 [IU]/mL (ref 0.30–0.70)

## 2017-10-02 LAB — COMPREHENSIVE METABOLIC PANEL
ALBUMIN: 2.6 g/dL — AB (ref 3.5–5.0)
ALK PHOS: 135 U/L — AB (ref 38–126)
ALT: 1059 U/L — ABNORMAL HIGH (ref 17–63)
ANION GAP: 18 — AB (ref 5–15)
AST: 786 U/L — ABNORMAL HIGH (ref 15–41)
BILIRUBIN TOTAL: 2.8 mg/dL — AB (ref 0.3–1.2)
BUN: 113 mg/dL — ABNORMAL HIGH (ref 6–20)
CALCIUM: 7.5 mg/dL — AB (ref 8.9–10.3)
CO2: 30 mmol/L (ref 22–32)
Chloride: 84 mmol/L — ABNORMAL LOW (ref 101–111)
Creatinine, Ser: 2.42 mg/dL — ABNORMAL HIGH (ref 0.61–1.24)
GFR calc non Af Amer: 26 mL/min — ABNORMAL LOW (ref >60)
GFR, EST AFRICAN AMERICAN: 31 mL/min — AB (ref >60)
GLUCOSE: 122 mg/dL — AB (ref 65–99)
POTASSIUM: 2.9 mmol/L — AB (ref 3.5–5.1)
SODIUM: 132 mmol/L — AB (ref 135–145)
TOTAL PROTEIN: 5.5 g/dL — AB (ref 6.5–8.1)

## 2017-10-02 LAB — CBC WITH DIFFERENTIAL/PLATELET
BASOS ABS: 0 10*3/uL (ref 0.0–0.1)
BASOS PCT: 0 %
Eosinophils Absolute: 0 10*3/uL (ref 0.0–0.7)
Eosinophils Relative: 0 %
HEMATOCRIT: 37.7 % — AB (ref 39.0–52.0)
HEMOGLOBIN: 12.8 g/dL — AB (ref 13.0–17.0)
LYMPHS PCT: 6 %
Lymphs Abs: 0.6 10*3/uL — ABNORMAL LOW (ref 0.7–4.0)
MCH: 31.8 pg (ref 26.0–34.0)
MCHC: 34 g/dL (ref 30.0–36.0)
MCV: 93.5 fL (ref 78.0–100.0)
MONO ABS: 0.4 10*3/uL (ref 0.1–1.0)
Monocytes Relative: 4 %
NEUTROS ABS: 10.5 10*3/uL — AB (ref 1.7–7.7)
NEUTROS PCT: 90 %
Platelets: 381 10*3/uL (ref 150–400)
RBC: 4.03 MIL/uL — AB (ref 4.22–5.81)
RDW: 13.6 % (ref 11.5–15.5)
WBC: 11.6 10*3/uL — AB (ref 4.0–10.5)

## 2017-10-02 LAB — LEGIONELLA PNEUMOPHILA SEROGP 1 UR AG: L. pneumophila Serogp 1 Ur Ag: NEGATIVE

## 2017-10-02 LAB — OCCULT BLOOD X 1 CARD TO LAB, STOOL: FECAL OCCULT BLD: POSITIVE — AB

## 2017-10-02 LAB — PROCALCITONIN: Procalcitonin: 4.06 ng/mL

## 2017-10-02 MED ORDER — POTASSIUM CHLORIDE CRYS ER 20 MEQ PO TBCR
40.0000 meq | EXTENDED_RELEASE_TABLET | Freq: Once | ORAL | Status: AC
  Administered 2017-10-02: 40 meq via ORAL
  Filled 2017-10-02: qty 2

## 2017-10-02 MED ORDER — PERFLUTREN LIPID MICROSPHERE
1.0000 mL | INTRAVENOUS | Status: AC | PRN
  Administered 2017-10-02: 4 mL via INTRAVENOUS
  Filled 2017-10-02: qty 10

## 2017-10-02 MED ORDER — PNEUMOCOCCAL VAC POLYVALENT 25 MCG/0.5ML IJ INJ
0.5000 mL | INJECTION | INTRAMUSCULAR | Status: AC
  Administered 2017-10-03: 0.5 mL via INTRAMUSCULAR
  Filled 2017-10-02: qty 0.5

## 2017-10-02 MED ORDER — FUROSEMIDE 10 MG/ML IJ SOLN
80.0000 mg | Freq: Two times a day (BID) | INTRAMUSCULAR | Status: DC
  Administered 2017-10-02: 80 mg via INTRAVENOUS
  Filled 2017-10-02: qty 8

## 2017-10-02 MED ORDER — INFLUENZA VAC SPLIT HIGH-DOSE 0.5 ML IM SUSY
0.5000 mL | PREFILLED_SYRINGE | INTRAMUSCULAR | Status: AC
  Administered 2017-10-03: 0.5 mL via INTRAMUSCULAR
  Filled 2017-10-02: qty 0.5

## 2017-10-02 MED ORDER — POTASSIUM CHLORIDE CRYS ER 20 MEQ PO TBCR
40.0000 meq | EXTENDED_RELEASE_TABLET | Freq: Two times a day (BID) | ORAL | Status: DC
  Administered 2017-10-02 (×2): 40 meq via ORAL
  Filled 2017-10-02 (×2): qty 2

## 2017-10-02 MED ORDER — FUROSEMIDE 10 MG/ML IJ SOLN: 80.0000 mg | Freq: Two times a day (BID) | INTRAMUSCULAR | Status: DC

## 2017-10-02 NOTE — Progress Notes (Signed)
  Echocardiogram 2D Echocardiogram has been performed.  Delcie Roch 10/02/2017, 9:03 AM

## 2017-10-02 NOTE — Progress Notes (Signed)
Subjective:  Stabilizing a little- HR down to 110's-120's on amio and now midodrine- making urine- made 8 liters !!!- on moderate lasix.  I think is making urine more due to increased kidney perfusion than due to meds  Objective Vital signs in last 24 hours: Vitals:   10/02/17 0530 10/02/17 0600 10/02/17 0700 10/02/17 0745  BP: (!) 132/103 100/85 99/74 93/67   Pulse:  (!) 123 68 (!) 116  Resp: 14 14 12  (!) 27  Temp:    97.6 F (36.4 C)  TempSrc:    Oral  SpO2:  100% 100% 96%  Weight:      Height:       Weight change: -1.947 kg (-4.7 oz)  Intake/Output Summary (Last 24 hours) at 10/02/2017 0809 Last data filed at 10/02/2017 0800 Gross per 24 hour  Intake 1972.57 ml  Output 8275 ml  Net -6302.43 ml    Assessment/Plan: 67 year old white male with cardiomyopathy on no medications due to noncompliance -he now presents with A. fib and RVR in the setting of probable pneumonitis with decompensated heart failure and acute kidney injury 1.Renal-normal creatinine 7 months ago.  Now with acute kidney injury with BUN out of proportion to creatinine.  I would suspect is secondary to a hemodynamic cause-prolonged A. fib with decreased renal perfusion.  Efforts underway to slow heart rate/improve kidney perfusion.  renal ultrasound OK, U/A neg- goes along with hemodynamic issue.  He currently has a condom catheter in place with much  increased urine output.  There are no absolute indications for dialysis.  I would support him in the short-term with dialysis if needed-but past behavior would indicate that he is a very poor candidate for chronic dialysis therapy. The good news is that urine output is increased and his K is down. I think is making urine more due to increased kidney perfusion than due to meds- with 8 liters of UOP will stop lasix 2. Hypertension/volume  -he is volume overloaded.  Decompensated heart failure secondary to A. fib.  Cardiology on board and efforts underway to slow heart rate down-  agreed with lasix - I have stopped the metolazone, think he will continue to autodiurese so will hold lasix.  Hyponatremia is due to volume overload- has improved- CVP may be al low as 8 3.  Uremia-unclear why BUN is so high out of proportion to the creatinine.  This normally signifies a GI bleed but his hemoglobin is high.  I am hopeful with just medicinal therapy and increased renal perfusion this will improve.  Over diuresis will make it worse- numbers slightly better today but probably stunted due to such large UOP- holding lasix 4. Anemia  -not an issue at the moment 5.  Hyperkalemia-now hypo on repletion 6.  Metabolic acidosis-resolved      Errica Dutil A    Labs: Basic Metabolic Panel: Recent Labs  Lab 10/02/2017 2251 10/01/17 0257 10/01/17 0652 10/01/17 0816 10/02/17 0412  NA 128* 128* 128* 129* 132*  K 5.7* 5.7* 4.2 4.3 2.9*  CL 91* 90* 88* 88* 84*  CO2 14* 14* 18* 17* 30  GLUCOSE 198* 186* 193* 174* 122*  BUN 130* 133* 132* 128* 113*  CREATININE 2.77* 2.80* 2.80* 2.73* 2.42*  CALCIUM 8.2* 8.0* 7.7* 7.7* 7.5*  PHOS 7.9* 8.2* 7.5*  --   --    Liver Function Tests: Recent Labs  Lab 10/04/2017 1712  10/01/17 0652 10/01/17 0816 10/02/17 0412  AST 1,400*  --   --  1,378* 786*  ALT  1,157*  --   --  1,251* 1,059*  ALKPHOS 136*  --   --  123 135*  BILITOT 3.2*  --   --  2.5* 2.8*  PROT 6.2*  --   --  5.9* 5.5*  ALBUMIN 2.8*   < > 2.6* 2.6* 2.6*   < > = values in this interval not displayed.   No results for input(s): LIPASE, AMYLASE in the last 168 hours. No results for input(s): AMMONIA in the last 168 hours. CBC: Recent Labs  Lab 10/07/2017 1149 10/01/17 0816 10/02/17 0412  WBC 17.6* 15.1* 11.6*  NEUTROABS  --   --  10.5*  HGB 14.9 13.2 12.8*  HCT 43.1 38.6* 37.7*  MCV 96.9 93.9 93.5  PLT 472* 402* 381   Cardiac Enzymes: No results for input(s): CKTOTAL, CKMB, CKMBINDEX, TROPONINI in the last 168 hours. CBG: No results for input(s): GLUCAP in the  last 168 hours.  Iron Studies: No results for input(s): IRON, TIBC, TRANSFERRIN, FERRITIN in the last 72 hours. Studies/Results: US Renal  Result Date: 10/01/2017 CLINICAL DATA:  Acute kidney injury EXAM: RENAL / URINARY TRACT ULTRASOUND COMPLETE COMPARISON:  None. FINDINGS: Right Kidney: Length: 12.6 cm.  No hydronephrosis or mass. Left Kidney: Length: 12.1 cm. Slight increased cortical echogenicity. No hydronephrosis or mass Bladder: Bladder is nearly empty Incidental note made of sludge filled gallbladder IMPRESSION: 1. Slight increased cortical echogenicity of the left kidney. No hydronephrosis 2. Incidental note made of sludge filled gallbladder Electronically Signed   By: Jasmine Pang M.D.   On: 10/01/2017 01:25   Dg Chest Port 1 View  Result Date: 10/01/2017 CLINICAL DATA:  Central line placement EXAM: PORTABLE CHEST 1 VIEW COMPARISON:  10/01/2017 at 0725 hours FINDINGS: New right-sided approach subclavian vein central line catheter crosses midline and is positioned in the expected location of the mid left brachiocephalic vein. Cardiomegaly stable with aortic atherosclerosis. Diffuse increase in interstitial prominence is again noted that may reflect mild interstitial edema, slightly more confluent at the right lung base. No effusion or pneumothorax. IMPRESSION: 1. Right subclavian central line catheter tip crosses midline and is in the expected location of the left brachiocephalic vein. No pneumothorax. These results will be called to the ordering clinician or representative by the Radiologist Assistant, and communication documented in the PACS or zVision Dashboard. 2. Stable cardiomegaly with aortic atherosclerosis mild interstitial prominence that may reflect interstitial edema. Minimally more confluent airspace opacities are seen at the right lung base laterally. Electronically Signed   By: Tollie Eth M.D.   On: 10/01/2017 13:33   Dg Chest Port 1 View  Result Date: 10/01/2017 CLINICAL  DATA:  Pneumonia. EXAM: PORTABLE CHEST 1 VIEW COMPARISON:  09/29/2017 FINDINGS: Marked cardiac enlargement is stable. Increased diffuse interstitial infiltrates are seen, with increased airspace disease at right lung base. No definite pleural effusion visualized. IMPRESSION: Increased diffuse interstitial infiltrates, with airspace disease in right lung base. Stable cardiac enlargement. Electronically Signed   By: Myles Rosenthal M.D.   On: 10/01/2017 08:05   Dg Chest Portable 1 View  Result Date: 09/17/2017 CLINICAL DATA:  Shortness of breath, tachycardia, nonproductive cough, and generalized weakness. EXAM: PORTABLE CHEST 1 VIEW COMPARISON:  07/02/2016 FINDINGS: The cardiac silhouette is moderately to prominently enlarged, accentuated compared to the prior study due to AP technique on the current examination. The thoracic aorta is tortuous. There is a hazy density in the right lung base which is new from the prior study. Prominence of the interstitial markings has  decreased from the prior study. No pleural effusion or pneumothorax is identified. No acute osseous abnormality is seen. IMPRESSION: 1. Chronic cardiomegaly. Mild diffuse interstitial prominence, less than on the prior study though which may reflect very mild interstitial edema. 2. Focal hazy opacity in the right lung base which could reflect early pneumonia or alveolar edema. Electronically Signed   By: Sebastian Ache M.D.   On: 09/29/2017 12:19   Medications: Infusions: . sodium chloride    . amiodarone 30.06 mg/hr (10/02/17 0800)  . cefTRIAXone (ROCEPHIN)  IV Stopped (10/02/17 0514)  . heparin 1,750 Units/hr (10/02/17 0800)  . milrinone 0.125 mcg/kg/min (10/02/17 0800)  . norepinephrine (LEVOPHED) Adult infusion Stopped (10/01/17 1445)    Scheduled Medications: . doxycycline  100 mg Oral Q12H  . furosemide  80 mg Intravenous BID  . [START ON 10/03/2017] Influenza vac split quadrivalent PF  0.5 mL Intramuscular Tomorrow-1000  . mouth rinse   15 mL Mouth Rinse BID  . [START ON 10/03/2017] pneumococcal 23 valent vaccine  0.5 mL Intramuscular Tomorrow-1000  . potassium chloride  40 mEq Oral BID  . sodium chloride flush  3 mL Intravenous Q12H    have reviewed scheduled and prn medications.  Physical Exam: General:looks slightly better- asking to go home- is thirsty Heart: tachy, irreg Lungs: mostly clear Abdomen: soft, non tender Extremities: still with pitting edema Dialysis Access: none - foley bag full    10/02/2017,8:09 AM  LOS: 2 days

## 2017-10-02 NOTE — Progress Notes (Signed)
ANTICOAGULATION CONSULT NOTE   Pharmacy Consult for Heparin  Indication: atrial fibrillation  No Known Allergies  Vital Signs: Temp: 97.9 F (36.6 C) (01/23 1900) Temp Source: Oral (01/23 1900) BP: 83/57 (01/23 2100) Pulse Rate: 66 (01/23 2100)  Labs: Recent Labs    October 23, 2017 1149  10/01/17 0816  10/02/17 0412 10/02/17 1453 10/02/17 1454 10/02/17 2059  HGB 14.9  --  13.2  --  12.8*  --   --   --   HCT 43.1  --  38.6*  --  37.7*  --   --   --   PLT 472*  --  402*  --  381  --   --   --   HEPARINUNFRC  --    < > <0.10*   < > 0.17* 0.17*  --  0.34  CREATININE 2.52*   < > 2.73*  --  2.42*  --  2.16* 2.32*   < > = values in this interval not displayed.    Estimated Creatinine Clearance: 32.8 mL/min (A) (by C-G formula based on SCr of 2.32 mg/dL (H)).   Medical History: Past Medical History:  Diagnosis Date  . Hypertension   . Inguinal hernia, bilateral    Assessment: 66yom with EF 25% known medication non-compliance.  Seen in ED acutely SOB volume overloaded > furosemide iv and Afib RVR start amiodarone drip.  Complicated with AKI Cr 2.5 K 6.2.  > improving with increased UOP  WBC up 17>11 CXR RLL opacity > start doxy and rocephin > strep pneumon pna and bacteremia  New ECHO 1/22 and confirmed 1/23 LV apical thrombus Heparin drip 1950 uts/hr HL 0.34 at goal now.   Goal of Therapy:  Heparin level 0.3-0.7 units/ml Monitor platelets by anticoagulation protocol: Yes   Plan:  Continue IV heparin 1950 units/hr. F/u AM heparin level and CBC  Bayard Hugger, PharmD, BCPS  Clinical Pharmacist  Pager: [phone]   10/02/2017 10:15 PM   '

## 2017-10-02 NOTE — Progress Notes (Addendum)
Advanced Heart Failure Rounding Note  PCP:  Primary Cardiologist: Murvin Gift   Subjective:    Yesterday milrinone 0.125 mcg added. Continued to diurese with IV lasix. Brisk diuresis noted. Negative 6.3 liters. Co-ox 63% today.   LFTs/renal function slowly improving.    Respiratory virus screen: Parainfluenza virus.  Bld CX- Strep pneumoniae--> IV rocephin.  CXR with RLL PNA.  Marland Kitchen   Echo: Severe LV dilation, EF 15-20%, severely dilated RV with severely decreased RV systolic function, moderate-severe Bryan, moderate-severe TR, cannot rule out LV apical thrombus.   Objective:   Weight Range: 175 lb 11.3 oz (79.7 kg) Body mass index is 25.21 kg/m.   Vital Signs:   Temp:  [97.5 F (36.4 C)-98.2 F (36.8 C)] 98.2 F (36.8 C) (01/23 0300) Pulse Rate:  [27-140] 123 (01/23 0600) Resp:  [14-37] 14 (01/23 0600) BP: (86-132)/(64-103) 100/85 (01/23 0600) SpO2:  [94 %-100 %] 100 % (01/23 0600) Weight:  [175 lb 11.3 oz (79.7 kg)] 175 lb 11.3 oz (79.7 kg) (01/23 0417) Last BM Date: 10/02/17  Weight change: Filed Weights   10/05/2017 1700 10/01/17 0630 10/02/17 0417  Weight: 181 lb 14.1 oz (82.5 kg) 185 lb 6.5 oz (84.1 kg) 175 lb 11.3 oz (79.7 kg)    Intake/Output:   Intake/Output Summary (Last 24 hours) at 10/02/2017 0714 Last data filed at 10/02/2017 0600 Gross per 24 hour  Intake 1875.1 ml  Output 8275 ml  Net -6399.9 ml      Physical Exam   CVP ~10  General:   Mild dyspnea talking.  HEENT: normal Neck: supple. JVP 10-11. Carotids 2+ bilat; no bruits. No lymphadenopathy or thryomegaly appreciated. Cor: PMI nondisplaced. Tachy/Irregular rate & rhythm. No rubs, gallops or murmurs. Lungs: Decreased in the bases. Abdomen: soft, nontender, nondistended. No hepatosplenomegaly. No bruits or masses. Good bowel sounds. Extremities: no cyanosis, clubbing, rash, R and LLE 1+ edema Neuro: alert & orientedx3, cranial nerves grossly intact. moves all 4 extremities w/o difficulty. Affect  pleasant   Telemetry  Afib 110s-120s personally reviewed.   EKG    n/a  Labs    CBC Recent Labs    10/01/17 0816 10/02/17 0412  WBC 15.1* 11.6*  NEUTROABS  --  10.5*  HGB 13.2 12.8*  HCT 38.6* 37.7*  MCV 93.9 93.5  PLT 402* 381   Basic Metabolic Panel Recent Labs    16/10/96 1206  10/01/17 0257 10/01/17 0652 10/01/17 0816 10/02/17 0412  NA  --    < > 128* 128* 129* 132*  K  --    < > 5.7* 4.2 4.3 2.9*  CL  --    < > 90* 88* 88* 84*  CO2  --    < > 14* 18* 17* 30  GLUCOSE  --    < > 186* 193* 174* 122*  BUN  --    < > 133* 132* 128* 113*  CREATININE  --    < > 2.80* 2.80* 2.73* 2.42*  CALCIUM  --    < > 8.0* 7.7* 7.7* 7.5*  MG 3.3*  --   --   --   --   --   PHOS  --    < > 8.2* 7.5*  --   --    < > = values in this interval not displayed.   Liver Function Tests Recent Labs    10/01/17 0816 10/02/17 0412  AST 1,378* 786*  ALT 1,251* 1,059*  ALKPHOS 123 135*  BILITOT 2.5* 2.8*  PROT 5.9* 5.5*  ALBUMIN 2.6* 2.6*   No results for input(s): LIPASE, AMYLASE in the last 72 hours. Cardiac Enzymes No results for input(s): CKTOTAL, CKMB, CKMBINDEX, TROPONINI in the last 72 hours.  BNP: BNP (last 3 results) Recent Labs    12/13/16 0949 02/11/17 1049 09/13/2017 1218  BNP 369.2* 562.7* 2,485.9*    ProBNP (last 3 results) No results for input(s): PROBNP in the last 8760 hours.   D-Dimer No results for input(s): DDIMER in the last 72 hours. Hemoglobin A1C No results for input(s): HGBA1C in the last 72 hours. Fasting Lipid Panel No results for input(s): CHOL, HDL, LDLCALC, TRIG, CHOLHDL, LDLDIRECT in the last 72 hours. Thyroid Function Tests Recent Labs    10/02/2017 1218  TSH 2.407    Other results:   Imaging    Dg Chest Port 1 View  Result Date: 10/01/2017 CLINICAL DATA:  Central line placement EXAM: PORTABLE CHEST 1 VIEW COMPARISON:  10/01/2017 at 0725 hours FINDINGS: New right-sided approach subclavian vein central line catheter crosses  midline and is positioned in the expected location of the mid left brachiocephalic vein. Cardiomegaly stable with aortic atherosclerosis. Diffuse increase in interstitial prominence is again noted that may reflect mild interstitial edema, slightly more confluent at the right lung base. No effusion or pneumothorax. IMPRESSION: 1. Right subclavian central line catheter tip crosses midline and is in the expected location of the left brachiocephalic vein. No pneumothorax. These results will be called to the ordering clinician or representative by the Radiologist Assistant, and communication documented in the PACS or zVision Dashboard. 2. Stable cardiomegaly with aortic atherosclerosis mild interstitial prominence that may reflect interstitial edema. Minimally more confluent airspace opacities are seen at the right lung base laterally. Electronically Signed   By: Tollie Eth M.D.   On: 10/01/2017 13:33   Dg Chest Port 1 View  Result Date: 10/01/2017 CLINICAL DATA:  Pneumonia. EXAM: PORTABLE CHEST 1 VIEW COMPARISON:  10/05/2017 FINDINGS: Marked cardiac enlargement is stable. Increased diffuse interstitial infiltrates are seen, with increased airspace disease at right lung base. No definite pleural effusion visualized. IMPRESSION: Increased diffuse interstitial infiltrates, with airspace disease in right lung base. Stable cardiac enlargement. Electronically Signed   By: Myles Rosenthal M.D.   On: 10/01/2017 08:05     Medications:     Scheduled Medications: . doxycycline  100 mg Oral Q12H  . [START ON 10/03/2017] Influenza vac split quadrivalent PF  0.5 mL Intramuscular Tomorrow-1000  . mouth rinse  15 mL Mouth Rinse BID  . [START ON 10/03/2017] pneumococcal 23 valent vaccine  0.5 mL Intramuscular Tomorrow-1000  . sodium chloride flush  3 mL Intravenous Q12H    Infusions: . sodium chloride    . amiodarone 60 mg/hr (10/02/17 0436)  . cefTRIAXone (ROCEPHIN)  IV Stopped (10/02/17 0514)  . furosemide Stopped  (10/02/17 0544)  . heparin 1,750 Units/hr (10/02/17 0610)  . milrinone 0.125 mcg/kg/min (10/01/17 1445)  . norepinephrine (LEVOPHED) Adult infusion Stopped (10/01/17 1445)    PRN Medications: sodium chloride, acetaminophen, albuterol, ALPRAZolam, ondansetron (ZOFRAN) IV, sodium chloride flush    Patient Profile  Bryan Harmon is a 67 year old with a history of PAF S/P DC-CV 11/2016, s/p bilateral inguinal hernia repair 11/2017, PVCs, HTN, NICM, chronic systolic heart failure.   Sent from Urgent Care with A fib RVR. Acutely SOB on arrival     Assessment/Plan   1. A fib RVR/PAF Had successful DV-CV 11/2016  Remains uncontrolled.  Continue amio 30  mg per hour +  heparin drip.  Will need TEE/DCCV when improved, respiratory status not stable at this time.  2. ID  Afebrile WBC 17.6 on admit --> 11.6   CXR  RLL pneumonia, PCT 4.06.  Lactic Acid 5.7>5.1   Blood CX-->Streptococcus Pneumoniae, respiratory virus culture with parainfluenza virus.  On rocephin + doxycyline .   3. Acute/Chronic Biventricular Systolic Heart Failure  ECHO Severely reduced EF ~15% RV severely reduced.   ? LV thrombus versus papillary muscle. Repeat limited ECHO today.  Todays Co-ox is 62.5%.  Yesterday milrinone 0.125 mcg added to facilitate diuresis with IV lasix + metolazone.  Volume status improving. Cut back lasix 80 mg twice a day. Supplement potassium  No bb for now.  No spiro,dig, arb for now with hyperkalemia.    4. Hyperkalemia K peaked 6.5. Today down to 2.9   5. AKI  Creatinine on admit 2.5-->2.8 -->2.4  Renal US- no hydronephrosis.    6. Acute Hypoxic Respiratory Failure On admit sats in the 70s. Sats stable on 3 liters.   7. PVCs  On amio drip.  Follow K and Mag.   8. Elevated LFTs LFTs starting to trend down.    9. Hypokalemia K 2.9 Supplement K.  Check BMEt at 1300  Medication concerns reviewed with patient and pharmacy team. Barriers identified: noncompliance.   Length  of Stay: 2  Tonye Becket, NP  10/02/2017, 7:14 AM  Advanced Heart Failure Team Pager [phone] (M-F; 7a - 4p)  Please contact [facility] for night-coverage after hours (4p -7a ) and weekends on [email or web address]  Patient seen with NP, agree with the above note. He has been "sick" for at least 3 weeks, started out with a flu-like illness and then became tachycardic and more and more short of breath. He had not taken any of his cardiac meds sinc 4/18. CXR today reviewed, RLL PNA. CVL placed yesterday and milrinone 0.125 started.  He diuresed very well yesterday, weight down.  Today, CVP 10 and co-ox 63%.  Echo reviewed, EF 15-20% with RV dysfunction.  This morning, he remains in atrial fibrillation rate 110s-120s, SBP 100s.  BUN/creatinine trending down, LFTs trending down.  PCT 3.7>4.06.  Strep pneumo in blood cultures and parainfluenzae virus in respiratory virus scan.  Breathing is better today. JVP 10 cm, crackles right base, heart irreg S1S2, tachy with 2/6 HSM apex.     1. Acute hypoxemic respiratory failure:  RLL PNA, Strep pneumoniae in blood cultures.  Respiratory cultures with parainfluenzae virus. Also acute/chronic systolic CHF.  Breathing more comfortable today.  - Covering with ceftriaxone/doxycycline.  - CVP down to 10 today, continue Lasix 80 mg IV bid.  1.Acute on chronic systolic CHF: Nonischemic cardiomyopathy. 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 Etiology of his CHF is not clear =>no definite inciting event. He has a history of HTN but doubt this was the only trigger. 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 This would ideally be confirmed by cMRI, but he has not wanted an MRI (concerned about side effects). He also has frequent PVCs, 21% total on last holter in 4/18 which is a risk for fall in EF.No family history of CMP. Cannot rule out viral myocarditis. SPEP negative. With medical  management, he initially felt a lot better. However, he quit all his meds in early 2018 with recurrence of NYHA III symptoms and onset of atrial fibrillation.He had TEE-DCCV in 3/18.TEE showed that EF remained25%. He quit his meds again in  4/18 and apparently did not restart them when I asked him to in 6/18. No meds probably since 4/18. Echo this admission with EF 15-20%, severe RV dysfunction, ?apical thrombus.  He is now on milrinone 0.125 with initial low co-ox.  Co-ox 63% today with CVP 10.  He diuresed well yesterday. He has AKI with BUN/creatinine markedly above baseline, probably cardiorenal (not sure how long he has been in marked RVR with his atrial fibrillation).   - Continue milrinone 0.125 mcg/kg/min.    - Continue Lasix 80 mg IV bid.  -Control HR with amiodarone gtt. 3. 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 It is possible that the PVCs contribute to his cardiomyopathy. - On amiodarone.  4. Atrial fibrillation:Persistent, now with RVR. HR up to 140s initially, now 110s-120 on amiodarone gtt at 30. Not sure how long he has been in atrial fibrillation with marked RVR, but this may have triggered his CHF and AKI due to worsening of cardiac output.  - Need to slow HR, continue amiodarone gtt at 30 mg/hr for now.  - On heparin gtt for anticoagulation with AKI.  - Will need eventual TEE-guided DCCV but respiratory status not stable for TEE-DCCV currently.  Hopefully will be there soon with further diuresis and treatment of PNA. 5. AKI: Creatinine 1.08 in 6/18, he has not been on any meds. Suspect cardiorenal syndrome with afib/RVR and fall in cardiac output. He has been hyperkalemic and acidotic.  K now down and BUN/creatinine trending down.   - Will have to replace K today.   -Nephrology following.  6. ID: Strep pneumo PNA as well as parainfluenza virus. PCT a bit higher today.  - Ceftriaxone/doxycycline  7. Elevated LFTs: Suspect shock liver type picture,  AST/ALT > 1000 at admission, now trending down.  8. ?LV thrombus: Limited echo today with Definity to see if he truly has thrombus.   CRITICAL CARE Performed by: Marca Ancona  Total critical care time: 35 minutes  Critical care time was exclusive of separately billable procedures and treating other patients.  Critical care was necessary to treat or prevent imminent or life-threatening deterioration.  Critical care was time spent personally by me on the following activities: development of treatment plan with patient and/or surrogate as well as nursing, discussions with consultants, evaluation of patient's response to treatment, examination of patient, obtaining history from patient or surrogate, ordering and performing treatments and interventions, ordering and review of laboratory studies, ordering and review of radiographic studies, pulse oximetry and re-evaluation of patient's condition.  Marca Ancona 10/02/2017 7:59 AM

## 2017-10-02 NOTE — Plan of Care (Signed)
Patient remains in A-fib, unaffected by Amio drip. Patient has poor appetite, encouraging patient to eat, remains on a 2L fluid restricted intake.  Patient and wife educated about decreasing fluid intake.  Patient still diuresing, continue condom cath per patient request.

## 2017-10-02 NOTE — Progress Notes (Signed)
PULMONARY / CRITICAL CARE MEDICINE   Name: Bryan Harmon MRN: [id number] DOB: Mar 11, 1951    ADMISSION DATE:  09/21/2017  CHIEF COMPLAINT: extreme fatigue  HISTORY OF PRESENT ILLNESS:        This is a 68 year old with a nonischemic cardiomyopathy and a last known ejection fraction of 20-25% associated with significant mitral regurgitation.  He also has a history of intermittent atrial fibrillation.  He presented this morning with complaints of extreme fatigue and he was found to be in atrial fibrillation with a rapid ventricular response.  In addition there is a subtle suggestion of a right lower lobe infiltrate on his chest x-ray.  Of note he is not aware when he is in atrial fibrillation.  He has not had any sense of palpitations or syncope.  He traces the beginning of his fatigue to approximately 8 days ago when he was suffering from watery diarrhea and intermittent subjective fever.  There was no blood in the stool and there was no associated abdominal pain.  He has not been taking any of his medications.  He does not feel as though his weight has increased or his dependent edema is greater than usual.  He is not suffering from more than usual orthopnea.  Pertinent to the subtle right lower lobe infiltrate he has not had any cough. Laboratory studies have shown an elevated lactic acid and elevated white count.  He also has had a significant rise in his creatinine from baseline and is hyperkalemic with a bicarbonate of only 12  Treatment in the department of emergency medicine has included an amiodarone infusion, doxycycline, Rocephin, Kayexalate, and a heparin infusion  SUBJECTIVE:  No events overnight, no new complaints  VITAL SIGNS: BP 93/67 (BP Location: Right Arm)   Pulse (!) 116   Temp 97.6 F (36.4 C) (Oral)   Resp (!) 27   Ht 5\' 10"  (1.778 m)   Wt 79.7 kg (175 lb 11.3 oz)   SpO2 96%   BMI 25.21 kg/m   HEMODYNAMICS: CVP:  [8 mmHg-22 mmHg] 11 mmHg  VENTILATOR  SETTINGS:    INTAKE / OUTPUT: I/O last 3 completed shifts: In: 3638.2 [P.O.:600; I.V.:2602.2; Other:200; IV Piggyback:236] Out: 9535 [Urine:9535]  PHYSICAL EXAMINATION: General: Chronically ill appearing male, NAD Neuro: Alert and interactive, moving all ext to command Cardiovascular: RRR, Nl S1/S2 and -M/R/G. He has 1+ lower extremity edema and + JVD.  Limbs are cool to touch, no mottling. Lungs: Bibasilar crackles Abdomen: Soft, NT, ND and +BS Skin: Warm dry and intact, cool to touch  LABS:  BMET Recent Labs  Lab 10/01/17 0652 10/01/17 0816 10/02/17 0412  NA 128* 129* 132*  K 4.2 4.3 2.9*  CL 88* 88* 84*  CO2 18* 17* 30  BUN 132* 128* 113*  CREATININE 2.80* 2.73* 2.42*  GLUCOSE 193* 174* 122*   Electrolytes Recent Labs  Lab 09/15/2017 1206  09/27/2017 2251 10/01/17 0257 10/01/17 0652 10/01/17 0816 10/02/17 0412  CALCIUM  --    < > 8.2* 8.0* 7.7* 7.7* 7.5*  MG 3.3*  --   --   --   --   --   --   PHOS  --   --  7.9* 8.2* 7.5*  --   --    < > = values in this interval not displayed.   CBC Recent Labs  Lab 10/05/2017 1149 10/01/17 0816 10/02/17 0412  WBC 17.6* 15.1* 11.6*  HGB 14.9 13.2 12.8*  HCT 43.1 38.6* 37.7*  PLT 472*  402* 381   Coag's No results for input(s): APTT, INR in the last 168 hours.  Sepsis Markers Recent Labs  Lab 09/13/2017 1330 09/12/2017 1712 10/01/17 0257 10/02/17 0412  LATICACIDVEN 5.74* 5.1*  --   --   PROCALCITON  --  3.38 3.70 4.06   ABG Recent Labs  Lab 09/18/2017 2308  PHART 7.352  PCO2ART 28.4*  PO2ART 101.0   Liver Enzymes Recent Labs  Lab 09/12/2017 1712  10/01/17 0652 10/01/17 0816 10/02/17 0412  AST 1,400*  --   --  1,378* 786*  ALT 1,157*  --   --  1,251* 1,059*  ALKPHOS 136*  --   --  123 135*  BILITOT 3.2*  --   --  2.5* 2.8*  ALBUMIN 2.8*   < > 2.6* 2.6* 2.6*   < > = values in this interval not displayed.   Cardiac Enzymes No results for input(s): TROPONINI, PROBNP in the last 168 hours.  Glucose No  results for input(s): GLUCAP in the last 168 hours.  Imaging Dg Chest Port 1 View  Result Date: 10/02/2017 CLINICAL DATA:  Followup respiratory failure EXAM: PORTABLE CHEST 1 VIEW COMPARISON:  10/01/2017 FINDINGS: Stable appearance of right subclavian central venous catheter with the tip in the left innominate vein. No pneumothorax is noted. Patchy changes are again seen in the right lung base somewhat accentuated by patient rotation. Cardiomegaly is again noted. The left lung is clear. IMPRESSION: Overall stable appearance of the chest when compared with the previous day. Electronically Signed   By: Alcide Clever M.D.   On: 10/02/2017 08:55   Dg Chest Port 1 View  Result Date: 10/01/2017 CLINICAL DATA:  Central line placement EXAM: PORTABLE CHEST 1 VIEW COMPARISON:  10/01/2017 at 0725 hours FINDINGS: New right-sided approach subclavian vein central line catheter crosses midline and is positioned in the expected location of the mid left brachiocephalic vein. Cardiomegaly stable with aortic atherosclerosis. Diffuse increase in interstitial prominence is again noted that may reflect mild interstitial edema, slightly more confluent at the right lung base. No effusion or pneumothorax. IMPRESSION: 1. Right subclavian central line catheter tip crosses midline and is in the expected location of the left brachiocephalic vein. No pneumothorax. These results will be called to the ordering clinician or representative by the Radiologist Assistant, and communication documented in the PACS or zVision Dashboard. 2. Stable cardiomegaly with aortic atherosclerosis mild interstitial prominence that may reflect interstitial edema. Minimally more confluent airspace opacities are seen at the right lung base laterally. Electronically Signed   By: Tollie Eth M.D.   On: 10/01/2017 13:33   Lines/ Tubes 1/22 CVC  Cultures: 1/21 Blood>>Strep Pneumoniae 1/21 Resp Viral Panel>> + Parainfluenza Virus 3  ABX:  1/21>>  Rocephin 1/21>> Doxy  Studies: 10/01/2017>> Echo EF 15-20%, Apex ? PAP muscle vs. Thrombus, LV severely dilated, moderate concentric hypertrophy, systolic function normal, unable to evaluate LV diastolic function due to atrial fibrillation. + moderate spontaneous echo contrast, indicative of stasis Impression Severely dilated LV with severe LV dysfunction globally with EF   15-20%. Severely dialted RV with severe RV dysfunction. Moderate   to severe MR with ERO 0.33cm2 and MR volume 41ml. Moderate to   severe TR with moderate pulmonary HTN. Moderately thickened and   calcified AV leaflets with mild MR, mildly dilated aortic root,   massive biatrial enlargement. Cannot rule out LV thrombus. There   is significant spontaneous echo contrast in LV c/w sluggish blood   flow. The right ventricular systolic  pressure was increased   consistent with moderate pulmonary hypertension.  DISCUSSION: This is a 67 year old with a cardiomyopathy who presents with extreme fatigue.  He is found to be in atrial fibrillation with a rapid ventricular response.  He reports a recent history of watery diarrhea and has had a bump in his creatinine and developed a non-gap acidosis with an elevated potassium.  He also has a significantly elevated lactic acid.  If not for his leukocytosis, we  would attribute his constellation of findings entirely to a low cardiac output state related to dehydration from his diarrhea and atrial fibrillation with a rapid ventricular response.   ASSESSMENT / PLAN:  PULMONARY  A: Acute Respiratory Failure 2/2 Acute on Chronic HF/ ? Infiltrates per CXR chest x-ray findings of an infiltrate at the right base , subtle.   empirically covered with a combination of Rocephin and doxycycline Pulmonary Edema CXR 1/22>> Increased diffuse interstitial infiltrates, with airspace disease in right lung base.  Pt. Spoke with Dr. [name] this am and has agreed to intubation if  necessary.  Plan: Titrate oxygen for sats > 94%  Hold further lasix CXR to PRN D/C BiPAP ABG prn ABX as above Low dose xanax for tachypnea  CARDIOVASCULAR A:  Acute on Chronic Systolic Heart Failure in patient who had stopped taking his meds Lactic/ metabolic  acidosis and fatigue  2/2 decreased CO   Volume overload EF per echo 1/22 is 15 - 20% with ?  LV thrombus  vs papillary muscle Atrial Fibtillation Amio gtt at 60 mg/hr Heparin gtt Consider TEE if respiratory stabilizes/ or intubated  Lactate 5.1 on 1/21 + 1.4 L last 24  P: Tele monitoring Map Goal > 65 No spiro,dig, arb for now with hyperkalemia and renal function Norepi to off as able Follow CVP  RENAL A:  Acute renal failure 2/2 decompensated HF,   A fib / Poor renal perfusion Creatinine is more than doubled since it was last checked and found to be 1.08.  BUN Creat ratio out of proportion, but HGB ok ( Doubt UGI bleed)   Metabolic acidosis>> HCO3 gtt off 1/22 am  Hyperkalemia>> Tx 1/22 with Ca, Kayexelate , HCO3 gtt @ 50 cc/ hr>> now off Hyponatremia 2/2 fluid overload Uremia >> Now auto diuresing Poor chronic HD candidate per renal Would consider bridging therapy if necessary  Plan: D/C bicarb drip Maintain renal perfusion with MAP goal > 65 Avoid nephrotoxic drugs Trend CMET daily Replete electrolytes daily Strict I&O Fluid restriction Pressor support as needed  Hematology A: Chronic Anemia Heparin gtt for thrombus and fib  Plan: Trend CBC  Monitor for blood loss DVT prophylaxis: Heparin gtt.  GI Elevated LFT's Plan: Diet as ordered LFT in AM  INFECTIOUS A:  Afebrile Leukocytosis Worsening infiltrates per CXR 1/22  BC positive for Strep Pneumoniae Resp Viral Panel>> + Parainfluenza Virus 3 PCT 3.70  Plan: Continue Rocephin and doxycycline for right lower lobe infiltrate, finish 5 days of doxy and 8 days of rocephin, if ready for d/c may change rocephin to augmentin and finish  the course that way. Follow Cultures / Micro Trend WBC/ Fever Curve Re-culture as is clinically indicated  Endocrine No Acute issues Plan: CBG Q 4 SSI  Neuro A:  Awake and alert Following Commands Plan: Neuro Checks per unit routine Minimize sedation Xanax as above  Psych A: Anxiety ? Depression/ ? Reason stopped taking medications Plan: Low Dose Xanax for anxiety Consider anti depressant when ready for discharge  If  off pressors today then will sign off, please call back if needed.  The patient is critically ill with multiple organ systems failure and requires high complexity decision making for assessment and support, frequent evaluation and titration of therapies, application of advanced monitoring technologies and extensive interpretation of multiple databases.   Critical Care Time devoted to patient care services described in this note is  35  Minutes. This time reflects time of care of this signee Dr [name]. This critical care time does not reflect procedure time, or teaching time or supervisory time of PA/NP/Med student/Med Resident etc but could involve care discussion time.  Alyson Reedy, M.D. [facility] Pulmonary/Critical Care Medicine. Pager: [phone]. After hours pager: [phone].

## 2017-10-02 NOTE — Progress Notes (Signed)
ANTICOAGULATION CONSULT NOTE   Pharmacy Consult for Heparin  Indication: atrial fibrillation  No Known Allergies  Vital Signs: Temp: 97.7 F (36.5 C) (01/23 1157) Temp Source: Axillary (01/23 1157) BP: 100/67 (01/23 1500) Pulse Rate: 87 (01/23 1500)  Labs: Recent Labs    09/23/2017 1149  10/01/17 0652 10/01/17 0816 10/01/17 2052 10/02/17 0412 10/02/17 1453  HGB 14.9  --   --  13.2  --  12.8*  --   HCT 43.1  --   --  38.6*  --  37.7*  --   PLT 472*  --   --  402*  --  381  --   HEPARINUNFRC  --    < >  --  <0.10* <0.10* 0.17* 0.17*  CREATININE 2.52*   < > 2.80* 2.73*  --  2.42*  --    < > = values in this interval not displayed.    Estimated Creatinine Clearance: 31.4 mL/min (A) (by C-G formula based on SCr of 2.42 mg/dL (H)).   Medical History: Past Medical History:  Diagnosis Date  . Hypertension   . Inguinal hernia, bilateral    Assessment: 66yom with EF 25% known medication non-compliance.  Seen in ED acutely SOB volume overloaded > furosemide iv and Afib RVR start amiodarone drip.  Complicated with AKI Cr 2.5 K 6.2.  > improving with increased UOP  WBC up 17>11 CXR RLL opacity > start doxy and rocephin > strep pneumon pna and bacteremia  Heparin drip 1750 uts/hr HL 0.17 < goal cbc stable and no bleeding. New ECHO 1/22 and confirmed 1/23 LV apical thrombus  Goal of Therapy:  Heparin level 0.3-0.7 units/ml Monitor platelets by anticoagulation protocol: Yes   Plan:  Increase IV heparin to 1950 units/hr. Recheck heparin level in 6 hrs. Daily heparin level and CBC  Leota Sauers Pharm.D. CPP, BCPS Clinical Pharmacist [phone] 10/02/2017 3:52 PM   '

## 2017-10-02 NOTE — Progress Notes (Signed)
ANTICOAGULATION CONSULT NOTE   Pharmacy Consult for Heparin  Indication: atrial fibrillation  No Known Allergies  Vital Signs: Temp: 98.2 F (36.8 C) (01/23 0300) Temp Source: Axillary (01/23 0300) BP: 105/67 (01/23 0430) Pulse Rate: 111 (01/23 0430)  Labs: Recent Labs    09/19/2017 1149  10/01/17 0257 10/01/17 2500 10/01/17 0816 10/01/17 2052 10/02/17 0412  HGB 14.9  --   --   --  13.2  --  12.8*  HCT 43.1  --   --   --  38.6*  --  37.7*  PLT 472*  --   --   --  402*  --  381  HEPARINUNFRC  --    < >  --   --  <0.10* <0.10* 0.17*  CREATININE 2.52*   < > 2.80* 2.80* 2.73*  --   --    < > = values in this interval not displayed.    Estimated Creatinine Clearance: 27.9 mL/min (A) (by C-G formula based on SCr of 2.73 mg/dL (H)).   Medical History: Past Medical History:  Diagnosis Date  . Hypertension   . Inguinal hernia, bilateral    Assessment: 66yom with EF 25% known medication non-compliance.  Seen in ED acutely SOB volume overloaded > furosemide iv and Afib RVR start amiodarone drip.  Complicated with AKI - SCr up to 2.7 On Eliquis PTA but was not taking medicine as of recent. Heparin level is still SUBtherapeutic  Goal of Therapy:  Heparin level 0.3-0.7 units/ml Monitor platelets by anticoagulation protocol: Yes   Plan:  -Increase heparin to 1750 units/hr -Daily HL, CBC -Check level this afternoon   Bryan Harmon  10/02/2017 5:44 AM '

## 2017-10-02 NOTE — Progress Notes (Signed)
   CVP 8-9. Add ted hose.    Per Dr [name]. Restart 80 mg IV lasix twice a day.    Remains in A Fib 130s . Continue amio drip and heparin drip.   Kana Reimann NP-C  3:53 PM

## 2017-10-03 ENCOUNTER — Encounter (HOSPITAL_COMMUNITY): Payer: Self-pay | Admitting: *Deleted

## 2017-10-03 ENCOUNTER — Other Ambulatory Visit: Payer: Self-pay

## 2017-10-03 LAB — COMPREHENSIVE METABOLIC PANEL
ALK PHOS: 171 U/L — AB (ref 38–126)
ALT: 908 U/L — AB (ref 17–63)
ANION GAP: 15 (ref 5–15)
AST: 549 U/L — ABNORMAL HIGH (ref 15–41)
Albumin: 2.5 g/dL — ABNORMAL LOW (ref 3.5–5.0)
BILIRUBIN TOTAL: 1.8 mg/dL — AB (ref 0.3–1.2)
BUN: 70 mg/dL — ABNORMAL HIGH (ref 6–20)
CO2: 32 mmol/L (ref 22–32)
CREATININE: 1.99 mg/dL — AB (ref 0.61–1.24)
Calcium: 8 mg/dL — ABNORMAL LOW (ref 8.9–10.3)
Chloride: 83 mmol/L — ABNORMAL LOW (ref 101–111)
GFR, EST AFRICAN AMERICAN: 39 mL/min — AB (ref >60)
GFR, EST NON AFRICAN AMERICAN: 33 mL/min — AB (ref >60)
Glucose, Bld: 133 mg/dL — ABNORMAL HIGH (ref 65–99)
Potassium: 3.2 mmol/L — ABNORMAL LOW (ref 3.5–5.1)
SODIUM: 130 mmol/L — AB (ref 135–145)
TOTAL PROTEIN: 5.7 g/dL — AB (ref 6.5–8.1)

## 2017-10-03 LAB — CBC WITH DIFFERENTIAL/PLATELET
BASOS ABS: 0 10*3/uL (ref 0.0–0.1)
BASOS PCT: 0 %
EOS ABS: 0 10*3/uL (ref 0.0–0.7)
Eosinophils Relative: 0 %
HEMATOCRIT: 37 % — AB (ref 39.0–52.0)
HEMOGLOBIN: 12.3 g/dL — AB (ref 13.0–17.0)
Lymphocytes Relative: 5 %
Lymphs Abs: 0.4 10*3/uL — ABNORMAL LOW (ref 0.7–4.0)
MCH: 31.8 pg (ref 26.0–34.0)
MCHC: 33.2 g/dL (ref 30.0–36.0)
MCV: 95.6 fL (ref 78.0–100.0)
Monocytes Absolute: 0.3 10*3/uL (ref 0.1–1.0)
Monocytes Relative: 4 %
NEUTROS ABS: 7 10*3/uL (ref 1.7–7.7)
NEUTROS PCT: 91 %
Platelets: 319 10*3/uL (ref 150–400)
RBC: 3.87 MIL/uL — ABNORMAL LOW (ref 4.22–5.81)
RDW: 14 % (ref 11.5–15.5)
WBC: 7.7 10*3/uL (ref 4.0–10.5)

## 2017-10-03 LAB — COOXEMETRY PANEL
CARBOXYHEMOGLOBIN: 1.3 % (ref 0.5–1.5)
Methemoglobin: 0.7 % (ref 0.0–1.5)
O2 SAT: 48.3 %
TOTAL HEMOGLOBIN: 12.6 g/dL (ref 12.0–16.0)

## 2017-10-03 LAB — CBC
HEMATOCRIT: 37.8 % — AB (ref 39.0–52.0)
Hemoglobin: 12.7 g/dL — ABNORMAL LOW (ref 13.0–17.0)
MCH: 32.1 pg (ref 26.0–34.0)
MCHC: 33.6 g/dL (ref 30.0–36.0)
MCV: 95.5 fL (ref 78.0–100.0)
PLATELETS: 351 10*3/uL (ref 150–400)
RBC: 3.96 MIL/uL — ABNORMAL LOW (ref 4.22–5.81)
RDW: 13.7 % (ref 11.5–15.5)
WBC: 8.8 10*3/uL (ref 4.0–10.5)

## 2017-10-03 LAB — CULTURE, BLOOD (ROUTINE X 2): Special Requests: ADEQUATE

## 2017-10-03 LAB — HEPARIN LEVEL (UNFRACTIONATED): Heparin Unfractionated: 0.41 IU/mL (ref 0.30–0.70)

## 2017-10-03 MED ORDER — SODIUM CHLORIDE 0.9% FLUSH
10.0000 mL | INTRAVENOUS | Status: DC | PRN
  Administered 2017-10-08: 10 mL
  Administered 2017-10-10: 40 mL
  Administered 2017-10-19: 10 mL
  Filled 2017-10-03 (×3): qty 40

## 2017-10-03 MED ORDER — FUROSEMIDE 10 MG/ML IJ SOLN: 80.0000 mg | Freq: Once | INTRAMUSCULAR | Status: DC

## 2017-10-03 MED ORDER — SODIUM CHLORIDE 0.9% FLUSH
10.0000 mL | Freq: Two times a day (BID) | INTRAVENOUS | Status: DC
  Administered 2017-10-04 – 2017-10-08 (×4): 10 mL

## 2017-10-03 MED ORDER — NOREPINEPHRINE BITARTRATE 1 MG/ML IV SOLN
0.0000 ug/min | INTRAVENOUS | Status: DC
  Administered 2017-10-03: 3 ug/min via INTRAVENOUS
  Administered 2017-10-04: 5 ug/min via INTRAVENOUS
  Administered 2017-10-04: 3 ug/min via INTRAVENOUS
  Administered 2017-10-05 – 2017-10-06 (×3): 5 ug/min via INTRAVENOUS
  Administered 2017-10-07: 10 ug/min via INTRAVENOUS
  Administered 2017-10-07: 8 ug/min via INTRAVENOUS
  Administered 2017-10-07: 5 ug/min via INTRAVENOUS
  Administered 2017-10-08 (×2): 6 ug/min via INTRAVENOUS
  Filled 2017-10-03 (×13): qty 4

## 2017-10-03 MED ORDER — CHLORHEXIDINE GLUCONATE CLOTH 2 % EX PADS
6.0000 | MEDICATED_PAD | Freq: Every day | CUTANEOUS | Status: DC
  Administered 2017-10-03 – 2017-10-11 (×8): 6 via TOPICAL

## 2017-10-03 MED ORDER — FUROSEMIDE 10 MG/ML IJ SOLN
80.0000 mg | Freq: Once | INTRAMUSCULAR | Status: AC
Start: 2017-10-03 — End: 2017-10-03
  Administered 2017-10-03: 80 mg via INTRAVENOUS
  Filled 2017-10-03: qty 8

## 2017-10-03 MED ORDER — POTASSIUM CHLORIDE 10 MEQ/50ML IV SOLN
10.0000 meq | INTRAVENOUS | Status: AC
  Administered 2017-10-03 (×6): 10 meq via INTRAVENOUS
  Filled 2017-10-03 (×6): qty 50

## 2017-10-03 MED ORDER — POTASSIUM CHLORIDE CRYS ER 20 MEQ PO TBCR
40.0000 meq | EXTENDED_RELEASE_TABLET | Freq: Three times a day (TID) | ORAL | Status: DC
  Administered 2017-10-03 (×3): 40 meq via ORAL
  Filled 2017-10-03 (×3): qty 2

## 2017-10-03 NOTE — Progress Notes (Signed)
Subjective:   HR still high on amio / midodrine-, some very low BPs-  making urine- made another 5 liters !!!-   Lasix restarted.  I think is making urine more due to increased kidney perfusion than due to meds- I will stop lasix AGAIN as now is causing issue I feel with his renal function  Objective Vital signs in last 24 hours: Vitals:   10/03/17 0430 10/03/17 0500 10/03/17 0510 10/03/17 0600  BP: 99/83 (!) 76/56 (!) 88/75 103/78  Pulse: 61 (!) 116 (!) 125 (!) 133  Resp: 15 13 (!) 21 15  Temp:      TempSrc:      SpO2: (!) 82% 90% 92% 94%  Weight:  79.9 kg (176 lb 2.4 oz)    Height:       Weight change: 0.2 kg (7.1 oz)  Intake/Output Summary (Last 24 hours) at 10/03/2017 0700 Last data filed at 10/03/2017 0600 Gross per 24 hour  Intake 2082.23 ml  Output 4875 ml  Net -2792.77 ml    Assessment/Plan: 67 year old white male with cardiomyopathy on no medications due to noncompliance -he now presents with A. fib and RVR in the setting of probable pneumonia with decompensated heart failure and acute kidney injury 1.Renal-normal creatinine 7 months ago.  Now with acute kidney injury with BUN out of proportion to creatinine.  I would suspect is secondary to a hemodynamic cause-prolonged A. fib with decreased renal perfusion.  renal ultrasound OK, U/A neg- goes along with hemodynamic issue.  condom catheter with much increased urine output.  There are no absolute indications for dialysis.  I would support him in the short-term with dialysis-but past behavior would indicate that he is a very poor candidate for chronic dialysis therapy. The good news is that urine output is increased. I think is making urine more due to increased kidney perfusion than due to meds- with another 5 liters of UOP will stop lasix AGAIN-  This much diuresis is affecting his renal recovery- crt bumped today  2. Hypertension/volume  -he is volume overloaded.  Decompensated heart failure secondary to A. fib.  Cardiology on  board and efforts underway to slow heart rate down- agreed with lasix initially but once started opening up does not need.  Hyponatremia is due to volume overload- has improved-  I understand that CVP is up- he is trying to correct- is almost 8 liters negative, forcing it is now causing hypotension and WILL give [place] ATN again 3.  Uremia-initially BUN was so high out of proportion to the creatinine.  This is better.   Over diuresis will make it worse- numbers slightly better today but probably stunted due to such large UOP- 4. Anemia  -not an issue at the moment 5.  Hyperkalemia-now hypo on repletion- have added 6 runs of IV and increase oral repletion to 40 TID and follow 6.  Metabolic acidosis-resolved and overcorrected       Taeler Winning A    Labs: Basic Metabolic Panel: Recent Labs  Lab 10/04/17 2251 10/01/17 0257 10/01/17 7253  10/02/17 0412 10/02/17 1454 10/02/17 2059  NA 128* 128* 128*   < > 132* 131* 129*  K 5.7* 5.7* 4.2   < > 2.9* 2.9* 2.7*  CL 91* 90* 88*   < > 84* 82* 81*  CO2 14* 14* 18*   < > 30 35* 33*  GLUCOSE 198* 186* 193*   < > 122* 143* 165*  BUN 130* 133* 132*   < > 113* 96*  78*  CREATININE 2.77* 2.80* 2.80*   < > 2.42* 2.16* 2.32*  CALCIUM 8.2* 8.0* 7.7*   < > 7.5* 7.7* 7.6*  PHOS 7.9* 8.2* 7.5*  --   --   --   --    < > = values in this interval not displayed.   Liver Function Tests: Recent Labs  Lab 09/26/2017 1712  10/01/17 0652 10/01/17 0816 10/02/17 0412  AST 1,400*  --   --  1,378* 786*  ALT 1,157*  --   --  1,251* 1,059*  ALKPHOS 136*  --   --  123 135*  BILITOT 3.2*  --   --  2.5* 2.8*  PROT 6.2*  --   --  5.9* 5.5*  ALBUMIN 2.8*   < > 2.6* 2.6* 2.6*   < > = values in this interval not displayed.   No results for input(s): LIPASE, AMYLASE in the last 168 hours. No results for input(s): AMMONIA in the last 168 hours. CBC: Recent Labs  Lab 09/17/2017 1149 10/01/17 0816 10/02/17 0412 10/03/17 0548  WBC 17.6* 15.1* 11.6* 7.7   NEUTROABS  --   --  10.5* 7.0  HGB 14.9 13.2 12.8* 12.3*  HCT 43.1 38.6* 37.7* 37.0*  MCV 96.9 93.9 93.5 95.6  PLT 472* 402* 381 319   Cardiac Enzymes: No results for input(s): CKTOTAL, CKMB, CKMBINDEX, TROPONINI in the last 168 hours. CBG: No results for input(s): GLUCAP in the last 168 hours.  Iron Studies: No results for input(s): IRON, TIBC, TRANSFERRIN, FERRITIN in the last 72 hours. Studies/Results: Dg Chest Port 1 View  Result Date: 10/02/2017 CLINICAL DATA:  Followup respiratory failure EXAM: PORTABLE CHEST 1 VIEW COMPARISON:  10/01/2017 FINDINGS: Stable appearance of right subclavian central venous catheter with the tip in the left innominate vein. No pneumothorax is noted. Patchy changes are again seen in the right lung base somewhat accentuated by patient rotation. Cardiomegaly is again noted. The left lung is clear. IMPRESSION: Overall stable appearance of the chest when compared with the previous day. Electronically Signed   By: Alcide Clever M.D.   On: 10/02/2017 08:55   Dg Chest Port 1 View  Result Date: 10/01/2017 CLINICAL DATA:  Central line placement EXAM: PORTABLE CHEST 1 VIEW COMPARISON:  10/01/2017 at 0725 hours FINDINGS: New right-sided approach subclavian vein central line catheter crosses midline and is positioned in the expected location of the mid left brachiocephalic vein. Cardiomegaly stable with aortic atherosclerosis. Diffuse increase in interstitial prominence is again noted that may reflect mild interstitial edema, slightly more confluent at the right lung base. No effusion or pneumothorax. IMPRESSION: 1. Right subclavian central line catheter tip crosses midline and is in the expected location of the left brachiocephalic vein. No pneumothorax. These results will be called to the ordering clinician or representative by the Radiologist Assistant, and communication documented in the PACS or zVision Dashboard. 2. Stable cardiomegaly with aortic atherosclerosis mild  interstitial prominence that may reflect interstitial edema. Minimally more confluent airspace opacities are seen at the right lung base laterally. Electronically Signed   By: Tollie Eth M.D.   On: 10/01/2017 13:33   Dg Chest Port 1 View  Result Date: 10/01/2017 CLINICAL DATA:  Pneumonia. EXAM: PORTABLE CHEST 1 VIEW COMPARISON:  10/03/2017 FINDINGS: Marked cardiac enlargement is stable. Increased diffuse interstitial infiltrates are seen, with increased airspace disease at right lung base. No definite pleural effusion visualized. IMPRESSION: Increased diffuse interstitial infiltrates, with airspace disease in right lung base. Stable cardiac enlargement. Electronically  Signed   By: Myles Rosenthal M.D.   On: 10/01/2017 08:05   Medications: Infusions: . sodium chloride    . amiodarone 60 mg/hr (10/03/17 0152)  . cefTRIAXone (ROCEPHIN)  IV 2 g (10/03/17 0557)  . heparin 1,950 Units/hr (10/02/17 2241)  . milrinone 0.125 mcg/kg/min (10/02/17 1900)  . norepinephrine (LEVOPHED) Adult infusion Stopped (10/01/17 1445)    Scheduled Medications: . furosemide  80 mg Intravenous BID  . Influenza vac split quadrivalent PF  0.5 mL Intramuscular Tomorrow-1000  . mouth rinse  15 mL Mouth Rinse BID  . pneumococcal 23 valent vaccine  0.5 mL Intramuscular Tomorrow-1000  . potassium chloride  40 mEq Oral BID  . sodium chloride flush  3 mL Intravenous Q12H    have reviewed scheduled and prn medications.  Physical Exam: General:looks better still Heart: tachy, irreg Lungs: mostly clear Abdomen: soft, non tender Extremities: still with pitting edema but much improved Dialysis Access: none - foley bag full    10/03/2017,7:00 AM  LOS: 3 days

## 2017-10-03 NOTE — Progress Notes (Signed)
Advanced Heart Failure Rounding Note  PCP:  Primary Cardiologist: Shardea Cwynar   Subjective:    He continues on milrinone 0.125.  Co-ox 48% this morning with CVP 14-15.  Creatinine down to 1.99.  Good diuresis yesterday with IV Lasix.  LFTs improving.   He remains in atrial fibrillation with rate 110s-120s.   Respiratory virus screen: Parainfluenza virus.  Bld CX- Strep pneumoniae--> IV rocephin.  CXR with RLL PNA.     Echo: Severe LV dilation, EF 15-20%, severely dilated RV with severely decreased RV systolic function, moderate-severe MR, moderate-severe TR, cannot rule out LV apical thrombus.  Repeat echo with Definity showed definite LV apical thrombus.   Objective:   Weight Range: 176 lb 2.4 oz (79.9 kg) Body mass index is 25.27 kg/m.   Vital Signs:   Temp:  [97.6 F (36.4 C)-97.9 F (36.6 C)] 97.8 F (36.6 C) (01/24 0300) Pulse Rate:  [25-133] 133 (01/24 0600) Resp:  [12-27] 14 (01/24 0700) BP: (76-116)/(47-85) 87/47 (01/24 0700) SpO2:  [82 %-100 %] 94 % (01/24 0600) Weight:  [176 lb 2.4 oz (79.9 kg)] 176 lb 2.4 oz (79.9 kg) (01/24 0500) Last BM Date: 10/02/17  Weight change: Filed Weights   10/01/17 0630 10/02/17 0417 10/03/17 0500  Weight: 185 lb 6.5 oz (84.1 kg) 175 lb 11.3 oz (79.7 kg) 176 lb 2.4 oz (79.9 kg)    Intake/Output:   Intake/Output Summary (Last 24 hours) at 10/03/2017 0741 Last data filed at 10/03/2017 0600 Gross per 24 hour  Intake 2082.23 ml  Output 4875 ml  Net -2792.77 ml      Physical Exam   CVP ~14-15  General: NAD Neck: JVP 14 cm, no thyromegaly or thyroid nodule.  Lungs: Crackles at bases bilaterally.  CV: Nondisplaced PMI.  Heart mildly tachy, irregular S1/S2, no S3/S4, 2/6 HSM LLSB/apex.  1+ ankle edema.   Abdomen: Soft, nontender, no hepatosplenomegaly, no distention.  Skin: Intact without lesions or rashes.  Neurologic: Alert and oriented x 3.  Psych: Normal affect. Extremities: No clubbing or cyanosis.  HEENT: Normal.    Telemetry  Afib 110s-120s personally reviewed.   EKG    n/a  Labs    CBC Recent Labs    10/02/17 0412 10/03/17 0548  WBC 11.6* 7.7  NEUTROABS 10.5* 7.0  HGB 12.8* 12.3*  HCT 37.7* 37.0*  MCV 93.5 95.6  PLT 381 319   Basic Metabolic Panel Recent Labs    40/98/11 1206  10/01/17 0257 10/01/17 0652  10/02/17 2059 10/03/17 0548  NA  --    < > 128* 128*   < > 129* 130*  K  --    < > 5.7* 4.2   < > 2.7* 3.2*  CL  --    < > 90* 88*   < > 81* 83*  CO2  --    < > 14* 18*   < > 33* 32  GLUCOSE  --    < > 186* 193*   < > 165* 133*  BUN  --    < > 133* 132*   < > 78* 70*  CREATININE  --    < > 2.80* 2.80*   < > 2.32* 1.99*  CALCIUM  --    < > 8.0* 7.7*   < > 7.6* 8.0*  MG 3.3*  --   --   --   --   --   --   PHOS  --    < > 8.2* 7.5*  --   --   --    < > =  values in this interval not displayed.   Liver Function Tests Recent Labs    10/02/17 0412 10/03/17 0548  AST 786* 549*  ALT 1,059* 908*  ALKPHOS 135* 171*  BILITOT 2.8* 1.8*  PROT 5.5* 5.7*  ALBUMIN 2.6* 2.5*   No results for input(s): LIPASE, AMYLASE in the last 72 hours. Cardiac Enzymes No results for input(s): CKTOTAL, CKMB, CKMBINDEX, TROPONINI in the last 72 hours.  BNP: BNP (last 3 results) Recent Labs    12/13/16 0949 02/11/17 1049 10/01/2017 1218  BNP 369.2* 562.7* 2,485.9*    ProBNP (last 3 results) No results for input(s): PROBNP in the last 8760 hours.   D-Dimer No results for input(s): DDIMER in the last 72 hours. Hemoglobin A1C No results for input(s): HGBA1C in the last 72 hours. Fasting Lipid Panel No results for input(s): CHOL, HDL, LDLCALC, TRIG, CHOLHDL, LDLDIRECT in the last 72 hours. Thyroid Function Tests Recent Labs    09/29/2017 1218  TSH 2.407    Other results:   Imaging    Dg Chest Port 1 View  Result Date: 10/02/2017 CLINICAL DATA:  Followup respiratory failure EXAM: PORTABLE CHEST 1 VIEW COMPARISON:  10/01/2017 FINDINGS: Stable appearance of right subclavian  central venous catheter with the tip in the left innominate vein. No pneumothorax is noted. Patchy changes are again seen in the right lung base somewhat accentuated by patient rotation. Cardiomegaly is again noted. The left lung is clear. IMPRESSION: Overall stable appearance of the chest when compared with the previous day. Electronically Signed   By: Alcide Clever M.D.   On: 10/02/2017 08:55     Medications:     Scheduled Medications: . Influenza vac split quadrivalent PF  0.5 mL Intramuscular Tomorrow-1000  . mouth rinse  15 mL Mouth Rinse BID  . pneumococcal 23 valent vaccine  0.5 mL Intramuscular Tomorrow-1000  . potassium chloride  40 mEq Oral TID  . sodium chloride flush  3 mL Intravenous Q12H    Infusions: . sodium chloride    . amiodarone 60 mg/hr (10/03/17 0152)  . cefTRIAXone (ROCEPHIN)  IV Stopped (10/03/17 7412)  . heparin 1,950 Units/hr (10/02/17 2241)  . milrinone 0.125 mcg/kg/min (10/02/17 1900)  . norepinephrine (LEVOPHED) Adult infusion Stopped (10/01/17 1445)  . potassium chloride      PRN Medications: sodium chloride, acetaminophen, albuterol, ALPRAZolam, ondansetron (ZOFRAN) IV, sodium chloride flush    Patient Profile  Mr [name] is a 67 year old with a history of PAF S/P DC-CV 11/2016, s/p bilateral inguinal hernia repair 11/2017, PVCs, HTN, NICM, chronic systolic heart failure.   Sent from Urgent Care with A fib RVR. Acutely SOB on arrival   Assessment/Plan   1. A fib RVR/PAF Had successful DV-CV 11/2016  Remains uncontrolled.  Continue amio 60   mg per hour + heparin drip.  Will need TEE/DCCV when improved, respiratory status not stable at this time.  2. ID  Afebrile WBC 17.6 on admit --> 11.6 -->7.7   CXR  RLL pneumonia, PCT 4.06.  Lactic Acid 5.7>5.1   Blood CX-->Streptococcus Pneumoniae, respiratory virus culture with parainfluenza virus.  On rocephin .    3. Acute/Chronic Biventricular Systolic Heart Failure  ECHO Severely reduced EF  ~15% RV severely reduced.   ? LV thrombus versus papillary muscle. Repeat limited ECHO today.  Todays 48%. Continue milrinone 0.125 mcg and NE at 3 mcg.  CVP 15. Continue lasix 80 mg twice a day. Renal function improving.  No bb for now.  No spiro,dig, arb for  now with hyperkalemia.    4. Hyperkalemia K peaked 6.5 on admit  K 3.2    5. AKI  Creatinine on admit 2.5-->2.8 -->2.4 -->1.99 Renal US- no hydronephrosis.    6. Acute Hypoxic Respiratory Failure On admit sats in the 70s.  - Sats stable on room air.   7. PVCs  On amio drip.  Follow K and Mag.   8. Elevated LFTs LFTs starting to trend down.    9. Hypokalemia k 3.2   Medication concerns reviewed with patient and pharmacy team. Barriers identified: noncompliance.   Length of Stay: 3  Amy Clegg, NP  10/03/2017, 7:41 AM  Advanced Heart Failure Team Pager [phone] (M-F; 7a - 4p)  Please contact [facility] for night-coverage after hours (4p -7a ) and weekends on [email or web address]  Patient seen with NP, agree with the above note. He had been "sick" for at least 3 weeks, started out with a flu-like illness and then became tachycardic and more and more short of breath. He had not taken any of his cardiac meds sinc 4/18. CXR showed RLL PNA.CVL placed and milrinone 0.125 started.  He diuresed very well again yesterday, weight down.  Today, CVP 14-15 and co-ox down a bit to 48%.  Echo showed EF 15-20% with RV dysfunction and LV apical thrombus.  This morning, he remains in atrial fibrillation rate 110s-120s, SBP 90s. BUN/creatinine continues to trend down, LFTs trending down. PCT 3.7>4.06. Strep pneumo in blood cultures and parainfluenzae virus in respiratory virus scan. Breathing continues to improve. JVP 14 cm, crackles right base, heart irreg S1S2, tachy with 2/6 HSM apex.   1. Acute hypoxemic respiratory failure: RLL PNA, Strep pneumoniae in blood cultures. Respiratory cultures with parainfluenzae virus. Also  acute/chronic systolic CHF. Breathing more comfortable today.  -Covering withceftriaxone currently, now off doxycycline.  -CVP remains 14-15, diuresing well.  Discussed with renal, think auto-diuresing so will hold Lasix for now.   1.Acute on chronic systolic CHF: Nonischemic cardiomyopathy. 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 Etiology of his CHF is not clear =>no definite inciting event. He has a history of HTN but doubt this was the only trigger. 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 This would ideally be confirmed by cMRI, but he has not wanted an MRI (concerned about side effects). He also has frequent PVCs, 21% total on last holter in 4/18 which is a risk for fall in EF.No family history of CMP. Cannot rule out viral myocarditis. SPEP negative. With medical management, he initially felt a lot better. However, he quit all his meds in early 2018 with recurrence of NYHA III symptoms and onset of atrial fibrillation.He had TEE-DCCV in 3/18.TEE showed that EF remained25%. He quit his meds again in 4/18 and apparently did not restart them when I asked him to in 6/18. No meds probably since 4/18. Echo this admission with EF 15-20%, severe RV dysfunction, LV apical thrombus.  He is now on milrinone 0.125 with initial low co-ox.  Co-ox down some to 48% today with CVP 14-15.  He diuresed well yesterday. He has AKI with BUN/creatinine markedly above baseline, probably cardiorenal (not sure how long he has been in marked RVR with his atrial fibrillation), but now trending down.  - Continue milrinone 0.125 mcg/kg/min.   - SBP 90s, will add low dose norepinephrine at 3 given lower co-ox today.   - Discussed with renal, think auto-diuresing at this point.  Will hold Lasix for  now but follow UOP closely.  He remains volume overloaded, and if he is not diuresing on his own today will need IV Lasix again.    -Control HR with amiodarone gtt. 3. 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 It is possible that the PVCs contribute to his cardiomyopathy. -Onamiodarone.  4. Atrial fibrillation:Persistent, now with RVR. HR up to 140sinitially, now 110s-120 on amiodarone gtt at 30. Not sure how long he has been in atrial fibrillation with marked RVR, but this may have triggered his CHF and AKI due to worsening of cardiac output.  - Need to slow HR,continueamiodarone gtt at 60 mg/hr for now.  -Onheparin gtt for anticoagulation with AKI.  - Would like to cardiovert.  However, he has an LV apical thrombus and we have not checked for LAA thrombus as he has not been stable for TEE.  This significantly complicates plans to cardiovert.  Will need to reassess for resolution of thrombus likely next week.  For now, work to control HR.  5. AKI: Creatinine 1.08 in 6/18, he has not been on any meds. Suspect cardiorenal syndrome with afib/RVR and fall in cardiac output.He has been hyperkalemic and acidotic.K now down and BUN/creatinine continue to trend down.   -Will have to replace K today.   -Nephrology following.  6. ZO:XWRUE pneumo PNA as well as parainfluenza virus.  - Ceftriaxone continues, off doxycycline. 7. Elevated LFTs: Suspect shock liver type picture, AST/ALT > 1000 at admission, now trending down.  8. LV thrombus: On heparin gtt.  This is complicating for cardioversion.   CRITICAL CARE Performed by: Marca Ancona  Total critical care time: 35 minutes  Critical care time was exclusive of separately billable procedures and treating other patients.  Critical care was necessary to treat or prevent imminent or life-threatening deterioration.  Critical care was time spent personally by me on the following activities: development of treatment plan with patient and/or surrogate as well as nursing, discussions with consultants, evaluation of patient's response to treatment,  examination of patient, obtaining history from patient or surrogate, ordering and performing treatments and interventions, ordering and review of laboratory studies, ordering and review of radiographic studies, pulse oximetry and re-evaluation of patient's condition.  Marca Ancona 10/03/2017 8:15 AM

## 2017-10-03 NOTE — Plan of Care (Signed)
  Progressing Education: Knowledge of General Education information will improve 10/03/2017 0938 - Progressing by Charlott Holler, RN Health Behavior/Discharge Planning: Ability to manage health-related needs will improve 10/03/2017 0938 - Progressing by Charlott Holler, RN Clinical Measurements: Ability to maintain clinical measurements within normal limits will improve 10/03/2017 0938 - Progressing by Charlott Holler, RN Will remain free from infection 10/03/2017 3903 - Progressing by Charlott Holler, RN Diagnostic test results will improve 10/03/2017 [phone] - Progressing by Charlott Holler, RN Respiratory complications will improve 10/03/2017 0938 - Progressing by Charlott Holler, RN Cardiovascular complication will be avoided 10/03/2017 0938 - Progressing by Charlott Holler, RN Activity: Risk for activity intolerance will decrease 10/03/2017 0938 - Progressing by Charlott Holler, RN Nutrition: Adequate nutrition will be maintained 10/03/2017 0938 - Progressing by Charlott Holler, RN Coping: Level of anxiety will decrease 10/03/2017 0938 - Progressing by Charlott Holler, RN Elimination: Will not experience complications related to bowel motility 10/03/2017 0938 - Progressing by Charlott Holler, RN Will not experience complications related to urinary retention 10/03/2017 0938 - Progressing by Charlott Holler, RN Safety: Ability to remain free from injury will improve 10/03/2017 0938 - Progressing by Charlott Holler, RN Skin Integrity: Risk for impaired skin integrity will decrease 10/03/2017 0938 - Progressing by Charlott Holler, RN Education: Ability to demonstrate management of disease process will improve 10/03/2017 0938 - Progressing by Charlott Holler, RN Ability to verbalize understanding of medication therapies will improve 10/03/2017 0938 - Progressing by Charlott Holler,  RN Activity: Capacity to carry out activities will improve 10/03/2017 0938 - Progressing by Charlott Holler, RN Cardiac: Ability to achieve and maintain adequate cardiopulmonary perfusion will improve 10/03/2017 0938 - Progressing by Charlott Holler, RN

## 2017-10-03 NOTE — Progress Notes (Signed)
eLink notified of K+ level and positive occult blood in stool.  No new orders. of K PO given as scheduled at 2200 hours.  Heparin remains at 1950 units/hour.

## 2017-10-03 NOTE — Care Management Note (Signed)
Case Management Note Donn Pierini RN, BSN Unit 4E-Case Manager-- 2H coverage [phone]  Patient Details  Name: KAUSTUBH SLUTZKY MRN: [id number] Date of Birth: 17-Jan-1951  Subjective/Objective:  Pt admitted with afib and Acute hypoxemic respiratory failure: RLL PNA, acute/chronic HF- started  and  remains on milrinone                Action/Plan: PTA pt lived at home with spouse- anticipate return home- CM to follow for transition of care needs.   Expected Discharge Date:                  Expected Discharge Plan:     In-House Referral:     Discharge planning Services  CM Consult  Post Acute Care Choice:    Choice offered to:     DME Arranged:    DME Agency:     HH Arranged:    HH Agency:     Status of Service:  In process, will continue to follow  If discussed at Long Length of Stay Meetings, dates discussed:    Discharge Disposition:   Additional Comments:  Darrold Span, RN 10/03/2017, 12:07 PM

## 2017-10-03 NOTE — Progress Notes (Addendum)
ANTICOAGULATION CONSULT NOTE   Pharmacy Consult for Heparin  Indication: atrial fibrillation  No Known Allergies  Vital Signs: Temp: 97.8 F (36.6 C) (01/24 0300) Temp Source: Oral (01/24 0300) BP: 87/47 (01/24 0700) Pulse Rate: 133 (01/24 0600)  Labs: Recent Labs    10/01/17 0816  10/02/17 0412 10/02/17 1453 10/02/17 1454 10/02/17 2059 10/03/17 0548  HGB 13.2  --  12.8*  --   --   --  12.3*  HCT 38.6*  --  37.7*  --   --   --  37.0*  PLT 402*  --  381  --   --   --  319  HEPARINUNFRC <0.10*   < > 0.17* 0.17*  --  0.34 0.41  CREATININE 2.73*  --  2.42*  --  2.16* 2.32* 1.99*   < > = values in this interval not displayed.    Estimated Creatinine Clearance: 38.2 mL/min (A) (by C-G formula based on SCr of 1.99 mg/dL (H)).   Medical History: Past Medical History:  Diagnosis Date  . Hypertension   . Inguinal hernia, bilateral    Assessment: 66yom with EF 25% known medication non-compliance.  Seen in ED acutely SOB volume overloaded > furosemide iv and Afib RVR start amiodarone drip.  Complicated with AKI Cr 2.5 K 6.2.  > improving with increased UOP  WBC up 17>11 CXR RLL opacity > start doxy and rocephin > strep pneumon pna and bacteremia  New ECHO 1/22 and confirmed 1/23 LV apical thrombus Heparin drip 1950 uts/hr HL 0.41 at goal now.  Per RN, pt with maroon colored stools overnight, FOB test positive.  Discussed with Dr. [name] - continue heparin for LV thrombus.  Goal of Therapy:  Heparin level 0.3-0.7 units/ml Monitor platelets by anticoagulation protocol: Yes   Plan:  Continue IV heparin 1950 units/hr. F/u AM heparin level and CBC F/u plans for oral anticoagulation eventually.  Tad Moore, BCPS  Clinical Pharmacist Pager [phone]  10/03/2017 8:34 AM

## 2017-10-04 ENCOUNTER — Ambulatory Visit: Payer: BLUE CROSS/BLUE SHIELD | Admitting: Family Medicine

## 2017-10-04 LAB — COOXEMETRY PANEL
CARBOXYHEMOGLOBIN: 0.8 % (ref 0.5–1.5)
CARBOXYHEMOGLOBIN: 1.3 % (ref 0.5–1.5)
METHEMOGLOBIN: 1.1 % (ref 0.0–1.5)
Methemoglobin: 0.8 % (ref 0.0–1.5)
O2 SAT: 40.8 %
O2 SAT: 52.3 %
Total hemoglobin: 13 g/dL (ref 12.0–16.0)
Total hemoglobin: 12.8 g/dL (ref 12.0–16.0)

## 2017-10-04 LAB — CBC WITH DIFFERENTIAL/PLATELET
BASOS ABS: 0 10*3/uL (ref 0.0–0.1)
BASOS PCT: 0 %
Eosinophils Absolute: 0 10*3/uL (ref 0.0–0.7)
Eosinophils Relative: 0 %
HCT: 38.3 % — ABNORMAL LOW (ref 39.0–52.0)
HEMOGLOBIN: 12.6 g/dL — AB (ref 13.0–17.0)
LYMPHS PCT: 9 %
Lymphs Abs: 0.6 10*3/uL — ABNORMAL LOW (ref 0.7–4.0)
MCH: 31.8 pg (ref 26.0–34.0)
MCHC: 32.9 g/dL (ref 30.0–36.0)
MCV: 96.7 fL (ref 78.0–100.0)
Monocytes Absolute: 0.5 10*3/uL (ref 0.1–1.0)
Monocytes Relative: 6 %
NEUTROS ABS: 6.1 10*3/uL (ref 1.7–7.7)
NEUTROS PCT: 85 %
Platelets: 354 10*3/uL (ref 150–400)
RBC: 3.96 MIL/uL — AB (ref 4.22–5.81)
RDW: 14.1 % (ref 11.5–15.5)
WBC: 7.2 10*3/uL (ref 4.0–10.5)

## 2017-10-04 LAB — RENAL FUNCTION PANEL
ANION GAP: 13 (ref 5–15)
Albumin: 2.6 g/dL — ABNORMAL LOW (ref 3.5–5.0)
BUN: 67 mg/dL — ABNORMAL HIGH (ref 6–20)
CALCIUM: 8.2 mg/dL — AB (ref 8.9–10.3)
CO2: 29 mmol/L (ref 22–32)
Chloride: 84 mmol/L — ABNORMAL LOW (ref 101–111)
Creatinine, Ser: 2.26 mg/dL — ABNORMAL HIGH (ref 0.61–1.24)
GFR calc non Af Amer: 29 mL/min — ABNORMAL LOW (ref >60)
GFR, EST AFRICAN AMERICAN: 33 mL/min — AB (ref >60)
GLUCOSE: 176 mg/dL — AB (ref 65–99)
Phosphorus: 3.9 mg/dL (ref 2.5–4.6)
Potassium: 4.8 mmol/L (ref 3.5–5.1)
SODIUM: 126 mmol/L — AB (ref 135–145)

## 2017-10-04 LAB — COMPREHENSIVE METABOLIC PANEL
ALBUMIN: 2.6 g/dL — AB (ref 3.5–5.0)
ALK PHOS: 190 U/L — AB (ref 38–126)
ALT: 840 U/L — ABNORMAL HIGH (ref 17–63)
AST: 497 U/L — ABNORMAL HIGH (ref 15–41)
Anion gap: 14 (ref 5–15)
BILIRUBIN TOTAL: 1.6 mg/dL — AB (ref 0.3–1.2)
BUN: 75 mg/dL — ABNORMAL HIGH (ref 6–20)
CALCIUM: 8.5 mg/dL — AB (ref 8.9–10.3)
CO2: 29 mmol/L (ref 22–32)
Chloride: 85 mmol/L — ABNORMAL LOW (ref 101–111)
Creatinine, Ser: 2.14 mg/dL — ABNORMAL HIGH (ref 0.61–1.24)
GFR calc non Af Amer: 31 mL/min — ABNORMAL LOW (ref >60)
GFR, EST AFRICAN AMERICAN: 36 mL/min — AB (ref >60)
Glucose, Bld: 151 mg/dL — ABNORMAL HIGH (ref 65–99)
POTASSIUM: 5 mmol/L (ref 3.5–5.1)
SODIUM: 128 mmol/L — AB (ref 135–145)
TOTAL PROTEIN: 5.8 g/dL — AB (ref 6.5–8.1)

## 2017-10-04 LAB — HEPARIN LEVEL (UNFRACTIONATED)
Heparin Unfractionated: 0.65 IU/mL (ref 0.30–0.70)
Heparin Unfractionated: 0.29 IU/mL — ABNORMAL LOW (ref 0.30–0.70)

## 2017-10-04 MED ORDER — FUROSEMIDE 10 MG/ML IJ SOLN
80.0000 mg | Freq: Once | INTRAMUSCULAR | Status: AC
  Administered 2017-10-04: 80 mg via INTRAVENOUS

## 2017-10-04 MED ORDER — ENSURE ENLIVE PO LIQD: 237.0000 mL | Freq: Two times a day (BID) | ORAL | Status: DC

## 2017-10-04 MED ORDER — FUROSEMIDE 10 MG/ML IJ SOLN
80.0000 mg | Freq: Two times a day (BID) | INTRAMUSCULAR | Status: DC
  Administered 2017-10-04: 80 mg via INTRAVENOUS
  Filled 2017-10-04: qty 8

## 2017-10-04 MED ORDER — FUROSEMIDE 10 MG/ML IJ SOLN
INTRAMUSCULAR | Status: AC
  Filled 2017-10-04: qty 8

## 2017-10-04 MED ORDER — METOLAZONE 2.5 MG PO TABS
2.5000 mg | ORAL_TABLET | Freq: Once | ORAL | Status: AC
Start: 2017-10-04 — End: 2017-10-04
  Administered 2017-10-04: 2.5 mg via ORAL
  Filled 2017-10-04: qty 1

## 2017-10-04 NOTE — Progress Notes (Signed)
Advanced Heart Failure Rounding Note  PCP:  Primary Cardiologist: Simren Popson   Subjective:   NE was started @ 3 mcg + milrinone 0.125 mcg. Todays CO-OX is 41%. Received IV lasix x 1 last night. Weight up 1 pound, I/Os positive.   Breathless talking. Able to 1-2 words then takes a break. Poor appetite.   Respiratory virus screen: Parainfluenza virus.  Bld CX- Strep pneumoniae--> IV rocephin.  CXR with RLL PNA.     Echo: Severe LV dilation, EF 15-20%, severely dilated RV with severely decreased RV systolic function, moderate-severe MR, moderate-severe TR, cannot rule out LV apical thrombus.  Repeat echo with Definity showed definite LV apical thrombus.   Objective:   Weight Range: 177 lb 11.1 oz (80.6 kg) Body mass index is 25.5 kg/m.   Vital Signs:   Temp:  [97.3 F (36.3 C)-97.9 F (36.6 C)] 97.3 F (36.3 C) (01/24 2338) Pulse Rate:  [29-134] 108 (01/25 0700) Resp:  [0-29] 29 (01/25 0700) BP: (74-130)/(56-114) 105/86 (01/25 0700) SpO2:  [80 %-100 %] 97 % (01/25 0700) Weight:  [177 lb 11.1 oz (80.6 kg)] 177 lb 11.1 oz (80.6 kg) (01/25 0437) Last BM Date: 10/03/17  Weight change: Filed Weights   10/02/17 0417 10/03/17 0500 10/04/17 0437  Weight: 175 lb 11.3 oz (79.7 kg) 176 lb 2.4 oz (79.9 kg) 177 lb 11.1 oz (80.6 kg)    Intake/Output:   Intake/Output Summary (Last 24 hours) at 10/04/2017 0727 Last data filed at 10/04/2017 0600 Gross per 24 hour  Intake 2485.47 ml  Output 1527 ml  Net 958.47 ml      Physical Exam  CVP 16.  General:  Sitting upright. Breathless.  HEENT: normal Neck: supple. JVP to jaw.  Carotids 2+ bilat; no bruits. No lymphadenopathy or thryomegaly appreciated. Cor: PMI nondisplaced. Tachy Irregular rate & rhythm. No rubs, or murmurs. + S3  Lungs: Crackles RML RLL LLL on  Abdomen: soft, nontender, nondistended. No hepatosplenomegaly. No bruits or masses. Good bowel sounds. Extremities: no cyanosis, clubbing, rash, RLE LLE 1+ with ted hose.  Extremities cool.  Neuro: alert & orientedx3, cranial nerves grossly intact. moves all 4 extremities w/o difficulty. Affect pleasant   Telemetry  Afib 120s personally reviewed.   EKG    n/a  Labs    CBC Recent Labs    10/03/17 0548 10/03/17 1354 10/04/17 0431  WBC 7.7 8.8 7.2  NEUTROABS 7.0  --  6.1  HGB 12.3* 12.7* 12.6*  HCT 37.0* 37.8* 38.3*  MCV 95.6 95.5 96.7  PLT 319 351 354   Basic Metabolic Panel Recent Labs    62/56/38 0548 10/04/17 0431  NA 130* 128*  K 3.2* 5.0  CL 83* 85*  CO2 32 29  GLUCOSE 133* 151*  BUN 70* 75*  CREATININE 1.99* 2.14*  CALCIUM 8.0* 8.5*   Liver Function Tests Recent Labs    10/03/17 0548 10/04/17 0431  AST 549* 497*  ALT 908* 840*  ALKPHOS 171* 190*  BILITOT 1.8* 1.6*  PROT 5.7* 5.8*  ALBUMIN 2.5* 2.6*   No results for input(s): LIPASE, AMYLASE in the last 72 hours. Cardiac Enzymes No results for input(s): CKTOTAL, CKMB, CKMBINDEX, TROPONINI in the last 72 hours.  BNP: BNP (last 3 results) Recent Labs    12/13/16 0949 02/11/17 1049 09/15/2017 1218  BNP 369.2* 562.7* 2,485.9*    ProBNP (last 3 results) No results for input(s): PROBNP in the last 8760 hours.   D-Dimer No results for input(s): DDIMER in the last  72 hours. Hemoglobin A1C No results for input(s): HGBA1C in the last 72 hours. Fasting Lipid Panel No results for input(s): CHOL, HDL, LDLCALC, TRIG, CHOLHDL, LDLDIRECT in the last 72 hours. Thyroid Function Tests No results for input(s): TSH, T4TOTAL, T3FREE, THYROIDAB in the last 72 hours.  Invalid input(s): FREET3  Other results:   Imaging    No results found.   Medications:     Scheduled Medications: . Chlorhexidine Gluconate Cloth  6 each Topical Daily  . mouth rinse  15 mL Mouth Rinse BID  . potassium chloride  40 mEq Oral TID  . sodium chloride flush  10-40 mL Intracatheter Q12H  . sodium chloride flush  3 mL Intravenous Q12H    Infusions: . sodium chloride 250 mL (10/03/17  1900)  . amiodarone 60 mg/hr (10/04/17 0206)  . cefTRIAXone (ROCEPHIN)  IV Stopped (10/04/17 0507)  . heparin 1,950 Units/hr (10/04/17 0206)  . milrinone 0.125 mcg/kg/min (10/03/17 2051)  . norepinephrine (LEVOPHED) Adult infusion 3 mcg/min (10/04/17 0206)    PRN Medications: sodium chloride, acetaminophen, albuterol, ALPRAZolam, ondansetron (ZOFRAN) IV, sodium chloride flush, sodium chloride flush    Patient Profile  [name] is a 67 year old with a history of PAF S/P DC-CV 11/2016, s/p bilateral inguinal hernia repair 11/2017, PVCs, HTN, NICM, chronic systolic heart failure.   Sent from Urgent Care with A fib RVR. Acutely SOB on arrival   Assessment/Plan   1. A fib RVR/PAF Had successful DV-CV 11/2016  Remains uncontrolled.  Continue amio 60   mg per hour + heparin drip.  Will need another ECHO next week.  Will need TEE/DCCV when improved but with newly diagnosed LV thrombus will need to hold off.   2. ID  Afebrile WBC 17.6 on admit --> 7.2    CXR  RLL pneumonia, PCT 4.06.  Lactic Acid 5.7>5.1   Blood CX-->Streptococcus Pneumoniae, respiratory virus culture with parainfluenza virus.  On rocephin .    3. Acute/Chronic Biventricular Systolic Heart Failure  ECHO Severely reduced EF ~15% RV severely reduced.   ? LV thrombus versus papillary muscle. Repeat limited ECHO today.  CO-OX down 41%.  Continue milrinone 0.125 mcg and increase  NE at 5 mcg. Repeat CO-OX in 2 hours.  Volume status up. Breathless. Start 80 mg IV lasix twice a day.   No bb for now.  No spiro,dig, arb for now with hyperkalemia.    4. Hyperkalemia K peaked 6.5 on admit  K 5.    5. AKI  Creatinine on admit 2.5-->2.8 -->2.4 -->1.99-->2.14  Renal US- no hydronephrosis.    6. Acute Hypoxic Respiratory Failure On admit sats in the 70s.  - Sats stable on room air.   7. PVCs  On amio drip.  Follow K and Mag.   8. Elevated LFTs LFTs trending down.     9. Hypokalemia Resolved.   10.  Melena FOBT + on 1/23   Discussed with Dr [name]. Will need to consider Palliative Care.   Medication concerns reviewed with patient and pharmacy team. Barriers identified: noncompliance.   Length of Stay: 4  Amy Clegg, NP  10/04/2017, 7:27 AM  Advanced Heart Failure Team Pager [phone] (M-F; 7a - 4p)  Please contact [facility] for night-coverage after hours (4p -7a ) and weekends on [email or web address]  Patient seen with NP, agree with the above note. He had been "sick" for at least 3 weeks, started out with a flu-like illness and then became tachycardic and more and more short of breath.  He hadnot taken any of his cardiac meds sinc 4/18. CXR showed RLL PNA.CVL placed and milrinone 0.125 started. He diuresed very well again yesterday, weight down. Today, CVP 16 and co-ox down to 41%. Echo showed EF 15-20% with RV dysfunction and LV apical thrombus. This morning, he remains in atrial fibrillation rate 100s-110s, SBP 90s-100s.BUN/creatinine fairly stable (slightly increased),LFTs trending down. PCT 3.7>4.06. Strep pneumo in blood cultures and parainfluenzae virus in respiratory virus scan.He was not diuresed yesterday, breathing is worse today.  JVP16 cm,crackles right base, heart irreg S1S2, tachy with 2/6 HSM apex.   1. Acute hypoxemic respiratory failure: RLL PNA, Strep pneumoniae in blood cultures. Respiratory cultures with parainfluenzae virus. Also acute/chronic systolic CHF.Not diuresed yesterday, I/Os positive, breathing worse. -Covering withceftriaxone currently, now off doxycycline.  -CVP 16, more short of breath, volume overloaded on exam. Restart Lasix 80 mg IV bid.  1.Acute on chronic systolic CHF: Nonischemic cardiomyopathy. 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 Etiology of his CHF is not clear =>no definite inciting event. He has a history of HTN but doubt this was the only  trigger. 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 This would ideally be confirmed by cMRI, but he has not wanted an MRI (concerned about side effects). He also has frequent PVCs, 21% total on last holter in 4/18 which is a risk for fall in EF.No family history of CMP. Cannot rule out viral myocarditis. SPEP negative. With medical management, he initially felt a lot better. However, he quit all his meds in early 2018 with recurrence of NYHA III symptoms and onset of atrial fibrillation.He had TEE-DCCV in 3/18.TEE showed that EF remained25%. He quit his meds again in 4/18 and apparently did not restart them when I asked him to in 6/18. No meds probably since 4/18. Echo this admission with EF 15-20%, severe RV dysfunction, LV apical thrombus. He is now on milrinone 0.125 with initial low co-ox. Co-ox down some to 41% today with CVP 16. He was not aggressively diuresed yesterday and looks more volume overloaded with increased dyspnea. He has AKI with BUN/creatinine markedly above baseline, probablycardiorenal (not sure how long he has been in marked RVR with his atrial fibrillation), has trended down but now stabilized. -Increase milrinone to 0.25 mcg/kg/min and norepinephrine to 5.  Repeat co-ox at noon.If he does not look better later today, will consider IABP.  -Restart Lasix 80 mg IV bid.   -ControlHR with amiodarone gtt (better around 110).  3. 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 It is possible that the PVCs contribute to his cardiomyopathy. -Onamiodarone.  4. Atrial fibrillation:Persistent, now with RVR. HR up to 140sinitially, now 110s-120 on amiodarone gtt at30. Not sure how long he has been in atrial fibrillation with marked RVR, but this may have triggered his CHF and AKI due to worsening of cardiac output.  - Need to slow HR,continueamiodarone gtt at60mg /hrfor now (HR better around 110).  -Onheparin gtt for anticoagulation with  AKI.  - Would like to cardiovert.  However, he has an LV apical thrombus and we have not checked for LAA thrombus as he has not been stable for TEE.  Based on limited available data, DCCV can be done with LV thrombus (only data is on 21 pts from 2006).  However, have not ruled out LA appendage thrombus.  Right now, for TEE-DCCV would probably end up having to intubate. 5. AKI: Creatinine 1.08 in 6/18, he has not been  on any meds. Suspect cardiorenal syndrome with afib/RVR and fall in cardiac output.He has been hyperkalemic and acidotic.BUN/creatinine still high (creatinine 2.14). -Nephrology following.  6. ZO:XWRUE pneumo PNA as well as parainfluenza virus. - Ceftriaxone continues, off doxycycline. 7. Elevated LFTs: Suspect shock liver type picture, AST/ALT > 1000 at admission, now trending down. 8. LV thrombus: On heparin gtt.     CRITICAL CARE Performed AV:[id number] Bryan Harmon  Total critical care time:58minutes  Critical care time was exclusive of separately billable procedures and treating other patients.  Critical care was necessary to treat or prevent imminent or life-threatening deterioration.  Critical care was time spent personally by me on the following activities: development of treatment plan with patient and/or surrogate as well as nursing, discussions with consultants, evaluation of patient's response to treatment, examination of patient, obtaining history from patient or surrogate, ordering and performing treatments and interventions, ordering and review of laboratory studies, ordering and review of radiographic studies, pulse oximetry and re-evaluation of patient's condition.  Marca Ancona 10/04/2017 8:21 AM

## 2017-10-04 NOTE — Progress Notes (Signed)
Verified with infection control that patient does not need droplet precautions for parainfluenza virus. According to infectious control only standard precautions are needed. Will continue to monitor.  [address] Lurena Joiner A

## 2017-10-04 NOTE — Progress Notes (Signed)
   CVP 18-19 UOP 350 cc for the last 8 hours.   Give 2.5 mg metolazone.   Continue IV lasix. CMET and CO-OX in am.   Amy Clegg NP-C  2:44 PM

## 2017-10-04 NOTE — Progress Notes (Signed)
Subjective:   HR still high on amio / midodrine-, some very low BPs, now on norepi-  making urine but has dropped- potassium has overcorrected a little as well as crt has increased   Objective Vital signs in last 24 hours: Vitals:   10/04/17 0500 10/04/17 0600 10/04/17 0700 10/04/17 0800  BP: 106/86 106/90 105/86 98/86  Pulse: (!) 57 (!) 111 (!) 108 (!) 112  Resp: (!) 21 10 (!) 29 (!) 26  Temp:      TempSrc:      SpO2: (!) 81% 96% 97% 98%  Weight:      Height:       Weight change: 0.7 kg (1 lb 8.7 oz)  Intake/Output Summary (Last 24 hours) at 10/04/2017 1610 Last data filed at 10/04/2017 0700 Gross per 24 hour  Intake 2477.27 ml  Output 1527 ml  Net 950.27 ml    Assessment/Plan: 67 year old white male with cardiomyopathy on no medications due to noncompliance -he now presents with A. fib and RVR in the setting of probable pneumonia with decompensated heart failure and acute kidney injury 1.Renal-normal creatinine 7 months ago.  Now with acute kidney injury, suspect is secondary to a hemodynamic cause-prolonged A. fib with decreased renal perfusion.  renal ultrasound OK, U/A neg- goes along with hemodynamic issue. Was improving but now worsening likely due to hypotension/ATN. Need to continue to support hemodynamically to help renal perfusion. No indications for HD at present.  I would support him in the short-term with dialysis-but past behavior would indicate that he is a very poor candidate for chronic dialysis therapy.  2. Hypertension/volume  -he is volume overloaded.  Decompensated heart failure secondary to A. fib.  Cardiology on board and efforts underway to slow heart rate down- agreed with lasix initially but once started opening up did not need.  Hyponatremia is due to volume overload- has improved-  I understand that CVP is up- he was trying to correct but now has ATN- is almost 6 liters negative, now lasix will likely not be effective until he recovers from this ATN again 3.   Uremia-initially BUN was so high out of proportion to the creatinine.  This is better.    4. Anemia  -not an issue at the moment 5.  Hyperkalemia- hypo on repletion- now up again due to ATN and correction- stop all supplemental K and hopefully will go down 6.  Metabolic acidosis-resolved      Rini Moffit A    Labs: Basic Metabolic Panel: Recent Labs  Lab 09/18/2017 2251 10/01/17 0257 10/01/17 9604  10/02/17 2059 10/03/17 0548 10/04/17 0431  NA 128* 128* 128*   < > 129* 130* 128*  K 5.7* 5.7* 4.2   < > 2.7* 3.2* 5.0  CL 91* 90* 88*   < > 81* 83* 85*  CO2 14* 14* 18*   < > 33* 32 29  GLUCOSE 198* 186* 193*   < > 165* 133* 151*  BUN 130* 133* 132*   < > 78* 70* 75*  CREATININE 2.77* 2.80* 2.80*   < > 2.32* 1.99* 2.14*  CALCIUM 8.2* 8.0* 7.7*   < > 7.6* 8.0* 8.5*  PHOS 7.9* 8.2* 7.5*  --   --   --   --    < > = values in this interval not displayed.   Liver Function Tests: Recent Labs  Lab 10/02/17 0412 10/03/17 0548 10/04/17 0431  AST 786* 549* 497*  ALT 1,059* 908* 840*  ALKPHOS 135* 171* 190*  BILITOT 2.8* 1.8* 1.6*  PROT 5.5* 5.7* 5.8*  ALBUMIN 2.6* 2.5* 2.6*   No results for input(s): LIPASE, AMYLASE in the last 168 hours. No results for input(s): AMMONIA in the last 168 hours. CBC: Recent Labs  Lab 10/01/17 0816 10/02/17 0412 10/03/17 0548 10/03/17 1354 10/04/17 0431  WBC 15.1* 11.6* 7.7 8.8 7.2  NEUTROABS  --  10.5* 7.0  --  6.1  HGB 13.2 12.8* 12.3* 12.7* 12.6*  HCT 38.6* 37.7* 37.0* 37.8* 38.3*  MCV 93.9 93.5 95.6 95.5 96.7  PLT 402* 381 319 351 354   Cardiac Enzymes: No results for input(s): CKTOTAL, CKMB, CKMBINDEX, TROPONINI in the last 168 hours. CBG: No results for input(s): GLUCAP in the last 168 hours.  Iron Studies: No results for input(s): IRON, TIBC, TRANSFERRIN, FERRITIN in the last 72 hours. Studies/Results: No results found. Medications: Infusions: . sodium chloride 250 mL (10/04/17 0700)  . amiodarone 60 mg/hr (10/04/17  0700)  . cefTRIAXone (ROCEPHIN)  IV Stopped (10/04/17 0507)  . heparin 1,800 Units/hr (10/04/17 0747)  . milrinone 0.25 mcg/kg/min (10/04/17 0809)  . norepinephrine (LEVOPHED) Adult infusion 5 mcg/min (10/04/17 0746)    Scheduled Medications: . Chlorhexidine Gluconate Cloth  6 each Topical Daily  . furosemide  80 mg Intravenous BID  . mouth rinse  15 mL Mouth Rinse BID  . sodium chloride flush  10-40 mL Intracatheter Q12H  . sodium chloride flush  3 mL Intravenous Q12H    have reviewed scheduled and prn medications.  Physical Exam: General:looks better still Heart: tachy, irreg Lungs: mostly clear Abdomen: soft, non tender Extremities: still with pitting edema but much improved Dialysis Access: none -    10/04/2017,8:12 AM  LOS: 4 days

## 2017-10-04 NOTE — Progress Notes (Signed)
Initial Nutrition Assessment  DOCUMENTATION CODES:   Non-severe (moderate) malnutrition in context of chronic illness  INTERVENTION:   Ensure Enlive po BID, each supplement provides 350 kcal and 20 grams of protein  Follow-up and provide diet education when appropriate   NUTRITION DIAGNOSIS:   Moderate Malnutrition related to chronic illness(CHF) as evidenced by mild fat depletion, severe fat depletion.  GOAL:   Patient will meet greater than or equal to 90% of their needs  MONITOR:   PO intake, Labs, Weight trends, Supplement acceptance, Skin  REASON FOR ASSESSMENT:   Malnutrition Screening Tool    ASSESSMENT:   67 yo male admitted with acute respiratory failure with RLL pneumonia as well as acute on chronic CHF, AKI. Pt with hx of HTN, CHF  Pt appears very weak, fatigued on visit today. Noted SATs in 88-89 range. [place] off and reinserted in patient nose and SATs increased to 95-96.  Recorded po intake 0-50% of meals; recorded po intake 15% of last 8 meals on average. Per wife, pt ate only ate a cup of yogurt with sips of liquid yesterday. Similar po intake the day before. Pt reports appetite has been down for at least 2 weeks.  Pt reports UBW around 190. Pt unsure what his current dry weight is. Current wt 177 pounds but edema present.  Per weight encounters, weight has been trending down over the past year. Pt weighed around 200 pounds the beginning of 2018. 10% wt loss in 1 year.   Labs: sodium 128, Creatinine 2.14, BUN 75 Meds: lasix   NUTRITION - FOCUSED PHYSICAL EXAM:    Most Recent Value  Orbital Region  Moderate depletion  Upper Arm Region  Mild depletion  Thoracic and Lumbar Region  Mild depletion  Buccal Region  Mild depletion  Temple Region  Moderate depletion  Clavicle Bone Region  Severe depletion  Clavicle and Acromion Bone Region  Moderate depletion  Scapular Bone Region  Severe depletion  Dorsal Hand  Moderate depletion  Patellar Region  Mild  depletion  Anterior Thigh Region  Moderate depletion  Posterior Calf Region  Unable to assess  Edema (RD Assessment)  Mild       Diet Order:  Diet 2 gram sodium Room service appropriate? Yes; Fluid consistency: Thin; Fluid restriction: 2000 mL Fluid  EDUCATION NEEDS:   Not appropriate for education at this time(Needs education on follow-up)  Skin:  Skin Assessment: Reviewed RN Assessment  Last BM:  1/24  Height:   Ht Readings from Last 1 Encounters:  10/01/17 5\' 10"  (1.778 m)    Weight:   Wt Readings from Last 1 Encounters:  10/04/17 177 lb 11.1 oz (80.6 kg)    Ideal Body Weight:  75.5 kg  BMI:  Body mass index is 25.5 kg/m.  Estimated Nutritional Needs:   Kcal:  2050-2250 kcals  Protein:  100-115 g  Fluid:  </= 2000 mL   Romelle Starcher MS, RD, LDN, CNSC [phone] Pager  [phone] Weekend/On-Call Pager

## 2017-10-04 NOTE — Plan of Care (Signed)
Pt continues to have shortness of breath while at rest and still exhibiting s/s of HFrEF. Pt does exhibit anxious behavior which was treated with PRN Xanax with little to no relief. He does indicate understanding of disease process.

## 2017-10-04 NOTE — Progress Notes (Addendum)
ANTICOAGULATION CONSULT NOTE   Pharmacy Consult for Heparin  Indication: atrial fibrillation  No Known Allergies  Vital Signs: Temp: 97.3 F (36.3 C) (01/24 2338) Temp Source: Axillary (01/24 2338) BP: 98/86 (01/25 0800) Pulse Rate: 112 (01/25 0800)  Labs: Recent Labs    10/02/17 2059 10/03/17 0548 10/03/17 1354 10/04/17 0431  HGB  --  12.3* 12.7* 12.6*  HCT  --  37.0* 37.8* 38.3*  PLT  --  319 351 354  HEPARINUNFRC 0.34 0.41  --  0.65  CREATININE 2.32* 1.99*  --  2.14*    Estimated Creatinine Clearance: 35.5 mL/min (A) (by C-G formula based on SCr of 2.14 mg/dL (H)).   Medical History: Past Medical History:  Diagnosis Date  . Hypertension   . Inguinal hernia, bilateral    Assessment: 66yom with EF 25% known medication non-compliance.  Seen in ED acutely SOB volume overloaded > furosemide iv and Afib RVR start amiodarone drip.  Complicated with AKI Cr 2.5 K 6.2.  > improving with increased UOP  WBC up 17>7 CXR RLL opacity > start doxy and rocephin > strep pneumon pna and bacteremia  New ECHO 1/22 and confirmed 1/23 LV apical thrombus Heparin drip 1950 uts/hr HL 0.65 at goal but upper end of range and + blood in stool yesterday and overnight and + hematuria. - will back down drip some and keep closer to low end of goal   Goal of Therapy:  Heparin level 0.3-0.7 units/ml Monitor platelets by anticoagulation protocol: Yes   Plan:  Decrease IV heparin 1800 units/hr. Heparin level in 8hr  Daily HL, CBC F/u plans for oral anticoagulation eventually.   Leota Sauers Pharm.D. CPP, BCPS Clinical Pharmacist [phone] 10/04/2017 8:33 AM   PM HL 0.3 on drip rate 1800 u/h - no change with recent bleeding

## 2017-10-05 ENCOUNTER — Inpatient Hospital Stay (HOSPITAL_COMMUNITY): Payer: Medicare Other

## 2017-10-05 LAB — CBC WITH DIFFERENTIAL/PLATELET
BASOS ABS: 0 10*3/uL (ref 0.0–0.1)
Basophils Relative: 0 %
EOS ABS: 0 10*3/uL (ref 0.0–0.7)
EOS PCT: 0 %
HCT: 36.7 % — ABNORMAL LOW (ref 39.0–52.0)
Hemoglobin: 12.1 g/dL — ABNORMAL LOW (ref 13.0–17.0)
LYMPHS ABS: 0.6 10*3/uL — AB (ref 0.7–4.0)
Lymphocytes Relative: 7 %
MCH: 31.2 pg (ref 26.0–34.0)
MCHC: 33 g/dL (ref 30.0–36.0)
MCV: 94.6 fL (ref 78.0–100.0)
Monocytes Absolute: 0.1 10*3/uL (ref 0.1–1.0)
Monocytes Relative: 2 %
Neutro Abs: 8.3 10*3/uL — ABNORMAL HIGH (ref 1.7–7.7)
Neutrophils Relative %: 91 %
PLATELETS: 319 10*3/uL (ref 150–400)
RBC: 3.88 MIL/uL — ABNORMAL LOW (ref 4.22–5.81)
RDW: 14 % (ref 11.5–15.5)
WBC: 9 10*3/uL (ref 4.0–10.5)

## 2017-10-05 LAB — BASIC METABOLIC PANEL
Anion gap: 16 — ABNORMAL HIGH (ref 5–15)
BUN: 57 mg/dL — ABNORMAL HIGH (ref 6–20)
CHLORIDE: 77 mmol/L — AB (ref 101–111)
CO2: 30 mmol/L (ref 22–32)
CREATININE: 2.24 mg/dL — AB (ref 0.61–1.24)
Calcium: 7.8 mg/dL — ABNORMAL LOW (ref 8.9–10.3)
GFR calc non Af Amer: 29 mL/min — ABNORMAL LOW (ref >60)
GFR, EST AFRICAN AMERICAN: 34 mL/min — AB (ref >60)
Glucose, Bld: 147 mg/dL — ABNORMAL HIGH (ref 65–99)
Potassium: 2.8 mmol/L — ABNORMAL LOW (ref 3.5–5.1)
Sodium: 123 mmol/L — ABNORMAL LOW (ref 135–145)

## 2017-10-05 LAB — COMPREHENSIVE METABOLIC PANEL
ALBUMIN: 2.6 g/dL — AB (ref 3.5–5.0)
ALK PHOS: 154 U/L — AB (ref 38–126)
ALT: 680 U/L — ABNORMAL HIGH (ref 17–63)
AST: 360 U/L — ABNORMAL HIGH (ref 15–41)
Anion gap: 15 (ref 5–15)
BILIRUBIN TOTAL: 1.3 mg/dL — AB (ref 0.3–1.2)
BUN: 61 mg/dL — AB (ref 6–20)
CALCIUM: 8.1 mg/dL — AB (ref 8.9–10.3)
CO2: 28 mmol/L (ref 22–32)
CREATININE: 2.22 mg/dL — AB (ref 0.61–1.24)
Chloride: 80 mmol/L — ABNORMAL LOW (ref 101–111)
GFR calc Af Amer: 34 mL/min — ABNORMAL LOW (ref >60)
GFR calc non Af Amer: 29 mL/min — ABNORMAL LOW (ref >60)
Glucose, Bld: 147 mg/dL — ABNORMAL HIGH (ref 65–99)
Potassium: 4.1 mmol/L (ref 3.5–5.1)
SODIUM: 123 mmol/L — AB (ref 135–145)
TOTAL PROTEIN: 5.8 g/dL — AB (ref 6.5–8.1)

## 2017-10-05 LAB — CULTURE, BLOOD (ROUTINE X 2)
CULTURE: NO GROWTH
Culture: NO GROWTH
Culture: NO GROWTH
SPECIAL REQUESTS: ADEQUATE
SPECIAL REQUESTS: ADEQUATE
Special Requests: ADEQUATE

## 2017-10-05 LAB — HEPARIN LEVEL (UNFRACTIONATED)
Heparin Unfractionated: 0.27 IU/mL — ABNORMAL LOW (ref 0.30–0.70)
Heparin Unfractionated: 0.36 IU/mL (ref 0.30–0.70)

## 2017-10-05 LAB — COOXEMETRY PANEL
Carboxyhemoglobin: 1.1 % (ref 0.5–1.5)
METHEMOGLOBIN: 1.1 % (ref 0.0–1.5)
O2 Saturation: 67.7 %
TOTAL HEMOGLOBIN: 12 g/dL (ref 12.0–16.0)

## 2017-10-05 MED ORDER — POTASSIUM CHLORIDE 20 MEQ/15ML (10%) PO SOLN
40.0000 meq | Freq: Once | ORAL | Status: AC
  Administered 2017-10-05: 40 meq via ORAL
  Filled 2017-10-05: qty 30

## 2017-10-05 MED ORDER — POTASSIUM CHLORIDE 20 MEQ/15ML (10%) PO SOLN
ORAL | Status: AC
  Filled 2017-10-05: qty 30

## 2017-10-05 MED ORDER — TOLVAPTAN 15 MG PO TABS
15.0000 mg | ORAL_TABLET | Freq: Once | ORAL | Status: AC
  Administered 2017-10-05: 15 mg via ORAL
  Filled 2017-10-05 (×2): qty 1

## 2017-10-05 MED ORDER — DEXTROSE 5 % IV SOLN
120.0000 mg | Freq: Two times a day (BID) | INTRAVENOUS | Status: DC
  Administered 2017-10-05 – 2017-10-07 (×5): 120 mg via INTRAVENOUS
  Filled 2017-10-05: qty 10
  Filled 2017-10-05: qty 12
  Filled 2017-10-05: qty 2
  Filled 2017-10-05: qty 12
  Filled 2017-10-05 (×2): qty 10

## 2017-10-05 MED ORDER — POTASSIUM CHLORIDE 20 MEQ/15ML (10%) PO SOLN
40.0000 meq | Freq: Once | ORAL | Status: AC
  Administered 2017-10-05: 40 meq via ORAL

## 2017-10-05 NOTE — Progress Notes (Addendum)
Patient ID: Bryan Harmon, male   DOB: 1951-04-12, 67 y.o.   MRN: [id number]     Advanced Heart Failure Rounding Note  PCP:  Primary Cardiologist: Linzi Ohlinger   Subjective:    Milrinone 0.375 mcg/kg/min currently with norepinephrine 5.  Co-ox better today at 63%, I/Os fairly even and BUN/creatinine stable at 61/2.22.  CVP remains 16-17.   He appears more comfortable this morning.  Was up to chair yesterday.   HR in 110s atrial fibrillation at rest, remains on amiodarone 60 mg/hr.  SBP 100s generally, no hypotensive episodes.    Respiratory virus screen: Parainfluenza virus.  Bld CX- Strep pneumoniae--> IV rocephin.  CXR with RLL PNA.     Echo: Severe LV dilation, EF 15-20%, severely dilated RV with severely decreased RV systolic function, moderate-severe MR, moderate-severe TR, cannot rule out LV apical thrombus.  Repeat echo with Definity showed definite LV apical thrombus.   Objective:   Weight Range: 182 lb 12.2 oz (82.9 kg) Body mass index is 26.22 kg/m.   Vital Signs:   Temp:  [97.8 F (36.6 C)-98.9 F (37.2 C)] 98.9 F (37.2 C) (01/26 0400) Pulse Rate:  [45-124] 107 (01/26 0700) Resp:  [11-29] 25 (01/26 0700) BP: (92-120)/(61-97) 110/88 (01/26 0700) SpO2:  [83 %-100 %] 99 % (01/26 0700) Weight:  [182 lb 12.2 oz (82.9 kg)] 182 lb 12.2 oz (82.9 kg) (01/26 0438) Last BM Date: 10/03/17  Weight change: Filed Weights   10/03/17 0500 10/04/17 0437 10/05/17 0438  Weight: 176 lb 2.4 oz (79.9 kg) 177 lb 11.1 oz (80.6 kg) 182 lb 12.2 oz (82.9 kg)    Intake/Output:   Intake/Output Summary (Last 24 hours) at 10/05/2017 0753 Last data filed at 10/05/2017 0700 Gross per 24 hour  Intake 3034.93 ml  Output 2350 ml  Net 684.93 ml      Physical Exam  CVP 16-17.  General: Sitting up, looks more comfortable this morning Neck: JVP 16+ cm, no thyromegaly or thyroid nodule.  Lungs: Crackles at bases. CV: Lateral PMI.  Heart irregular S1/S2, no S3/S4, 3/6 HSM apex.  Trace ankle  edema.   Abdomen: Soft, nontender, no hepatosplenomegaly, no distention.  Skin: Intact without lesions or rashes.  Neurologic: Alert and oriented x 3.  Psych: Normal affect. Extremities: No clubbing or cyanosis.  HEENT: Normal.   Telemetry  Afib 110s personally reviewed.   EKG    n/a  Labs    CBC Recent Labs    10/04/17 0431 10/05/17 0430  WBC 7.2 9.0  NEUTROABS 6.1 8.3*  HGB 12.6* 12.1*  HCT 38.3* 36.7*  MCV 96.7 94.6  PLT 354 319   Basic Metabolic Panel Recent Labs    17/61/60 1407 10/05/17 0430  NA 126* 123*  K 4.8 4.1  CL 84* 80*  CO2 29 28  GLUCOSE 176* 147*  BUN 67* 61*  CREATININE 2.26* 2.22*  CALCIUM 8.2* 8.1*  PHOS 3.9  --    Liver Function Tests Recent Labs    10/04/17 0431 10/04/17 1407 10/05/17 0430  AST 497*  --  360*  ALT 840*  --  680*  ALKPHOS 190*  --  154*  BILITOT 1.6*  --  1.3*  PROT 5.8*  --  5.8*  ALBUMIN 2.6* 2.6* 2.6*   No results for input(s): LIPASE, AMYLASE in the last 72 hours. Cardiac Enzymes No results for input(s): CKTOTAL, CKMB, CKMBINDEX, TROPONINI in the last 72 hours.  BNP: BNP (last 3 results) Recent Labs    12/13/16 [phone]  02/11/17 1049 09/21/2017 1218  BNP 369.2* 562.7* 2,485.9*    ProBNP (last 3 results) No results for input(s): PROBNP in the last 8760 hours.   D-Dimer No results for input(s): DDIMER in the last 72 hours. Hemoglobin A1C No results for input(s): HGBA1C in the last 72 hours. Fasting Lipid Panel No results for input(s): CHOL, HDL, LDLCALC, TRIG, CHOLHDL, LDLDIRECT in the last 72 hours. Thyroid Function Tests No results for input(s): TSH, T4TOTAL, T3FREE, THYROIDAB in the last 72 hours.  Invalid input(s): FREET3  Other results:   Imaging    No results found.   Medications:     Scheduled Medications: . Chlorhexidine Gluconate Cloth  6 each Topical Daily  . feeding supplement (ENSURE ENLIVE)  237 mL Oral BID BM  . mouth rinse  15 mL Mouth Rinse BID  . sodium chloride  flush  10-40 mL Intracatheter Q12H  . sodium chloride flush  3 mL Intravenous Q12H  . tolvaptan  15 mg Oral Once    Infusions: . sodium chloride 250 mL (10/04/17 2000)  . amiodarone 60 mg/hr (10/05/17 0300)  . cefTRIAXone (ROCEPHIN)  IV Stopped (10/05/17 9604)  . furosemide    . heparin 1,900 Units/hr (10/05/17 0646)  . milrinone 0.375 mcg/kg/min (10/05/17 0423)  . norepinephrine (LEVOPHED) Adult infusion 5 mcg/min (10/04/17 2000)    PRN Medications: sodium chloride, acetaminophen, albuterol, ALPRAZolam, ondansetron (ZOFRAN) IV, sodium chloride flush, sodium chloride flush    Patient Profile  Mr [name] is a 67 year old with a history of PAF S/P DC-CV 11/2016, s/p bilateral inguinal hernia repair 11/2017, PVCs, HTN, NICM, chronic systolic heart failure.   Sent from Urgent Care with A fib RVR. Acutely SOB on arrival   Assessment/Plan   1. Acute hypoxemic respiratory failure: RLL PNA, Strep pneumoniae in blood cultures. Respiratory cultures with parainfluenzae virus. Also acute/chronic systolic CHF.He got Lasix + metolazone yesterday but I/Os are even.  He does look more comfortable this morning. -Covering withceftriaxone currently, now off doxycycline.  -CVP 16-17, will need to continue attempts at diuresis.  1.Acute on chronic systolic CHF: Nonischemic cardiomyopathy. 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 Etiology of his CHF is not clear =>no definite inciting event. He has a history of HTN but doubt this was the only trigger. 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 This would ideally be confirmed by cMRI, but he has not wanted an MRI (concerned about side effects). He also has frequent PVCs, 21% total on last holter in 4/18 which is a risk for fall in EF.No family history of CMP. Cannot rule out viral myocarditis. SPEP negative. With medical management, he initially felt a lot better.  However, he quit all his meds in early 2018 with recurrence of NYHA III symptoms and onset of atrial fibrillation.He had TEE-DCCV in 3/18.TEE showed that EF remained25%. He quit his meds again in 4/18 and apparently did not restart them when I asked him to in 6/18. No meds probably since 4/18. Echo this admission with EF 15-20%, severe RV dysfunction, LV apical thrombus. He is now on milrinone 0.375 + norepinephrine 5. Co-ox improved to 68% this morning with CVP 16. I/Os yesterday basically even, BUN/creatinine stable, SBP in 100s.  - Lasix 120 mg IV bid today.  -Will give dose of tolvaptan, see below.  - Continue current milrinone + norepinephrine.  -ControlHR with amiodarone gtt (better around 110) => ultimately needs NSR.  If we can get some fluid off  his lungs this weekend, possible TEE-DCCV on Monday.  3. 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 It is possible that the PVCs contribute to his cardiomyopathy. -Onamiodarone.  4. Atrial fibrillation:Persistent, now with RVR. HR up to 140sinitially, now 110s on amiodarone gtt at60. Not sure how long he has been in atrial fibrillation with marked RVR, but this may have triggered his CHF and AKI due to worsening of cardiac output.  - Need to slow HR,continueamiodarone gtt at60mg /hrfor now (HR better around 110).  -Onheparin gtt for anticoagulation with AKI.  - Would like to cardiovert.  However, he has an LV apical thrombus and we have not checked for LAA thrombus as he has not been stable for TEE.  Based on limited available data, DCCV can be done with LV thrombus (only data is on 21 pts from 2006).  However, have not ruled out LA appendage thrombus.  Right now, for TEE-DCCV would probably end up having to intubate => will see if he can be tuned up for TEE-DCCV on Monday. 5. AKI: Creatinine 1.08 in 6/18, he has not been on any meds. Suspect cardiorenal syndrome with afib/RVR and fall in cardiac output.He was  initially hyperkalemic and acidotic.BUN/creatinine still high (creatinine 2.22). -Nephrology following.  - Will try to maintain cardiac output and BP to avoid further hemodynamic damage.  6. ZO:XWRUE pneumo PNA as well as parainfluenza virus. - Ceftriaxone continues, off doxycycline. 7. Elevated LFTs: Suspect shock liver type picture, AST/ALT > 1000 at admission, now trending down steadily. 8. LV thrombus: On heparin gtt.    9. Hyponatremia: Hypervolemic hyponatremia, will give dose of tolvaptan today.   CRITICAL CARE Performed AV:[id number] Gabino Hagin  Total critical care time:72minutes  Critical care time was exclusive of separately billable procedures and treating other patients.  Critical care was necessary to treat or prevent imminent or life-threatening deterioration.  Critical care was time spent personally by me on the following activities: development of treatment plan with patient and/or surrogate as well as nursing, discussions with consultants, evaluation of patient's response to treatment, examination of patient, obtaining history from patient or surrogate, ordering and performing treatments and interventions, ordering and review of laboratory studies, ordering and review of radiographic studies, pulse oximetry and re-evaluation of patient's condition.  Marca Ancona 10/05/2017 7:53 AM

## 2017-10-05 NOTE — Progress Notes (Signed)
Subjective:   Remains on norepi- stable dose as wel as milrinone- HR still tachy but BP better than it was- UOP 2300 on lasix and metolazone given yesterday- most recent CVP reading as 11 with a positive fluid balance ??  Objective Vital signs in last 24 hours: Vitals:   10/05/17 0438 10/05/17 0500 10/05/17 0600 10/05/17 0700  BP:  107/85 101/87 110/88  Pulse:  (!) 53 (!) 122 (!) 107  Resp:  (!) 22 (!) 25 (!) 25  Temp:      TempSrc:      SpO2:  95% (!) 83% 99%  Weight: 82.9 kg (182 lb 12.2 oz)     Height:       Weight change: 2.3 kg (5 lb 1.1 oz)  Intake/Output Summary (Last 24 hours) at 10/05/2017 0740 Last data filed at 10/05/2017 0700 Gross per 24 hour  Intake 3034.93 ml  Output 2350 ml  Net 684.93 ml    Assessment/Plan: 67 year old white male with cardiomyopathy on no medications due to noncompliance -he now presents with A. fib and RVR in the setting of probable pneumonia with decompensated heart failure and acute kidney injury 1.Renal-normal creatinine 7 months ago.  Now with acute kidney injury, suspect is secondary to a hemodynamic cause-prolonged A. fib with decreased renal perfusion.  renal ultrasound OK, U/A neg- goes along with hemodynamic issue. Was improving then stalled likely due to hypotension/ATN. Need to continue to support hemodynamically to help renal perfusion. No indications for HD at present.  I would support him in the short-term with dialysis-but past behavior (no meds for 7 10 months) would indicate that he is a very poor candidate for chronic dialysis therapy.  2. Hypertension/volume  -he is volume overloaded.  Decompensated heart failure secondary to A. fib.  Cardiology on board and efforts underway to slow heart rate down and improve BP- agreed with lasix initially but once started opening up did not need.  Now needs again to assist with diuresis- per cardiology- have been able to maintain reasonable UOP  3.  Uremia-initially BUN was so high out of proportion  to the creatinine.  This is better.    4. Anemia  -not an issue at the moment 5.  Hyperkalemia- hypo was on repletion- then up again due to ATN and correction- stop all supplemental K - is fine for now 6.  Metabolic acidosis-resolved      Lyan Holck A    Labs: Basic Metabolic Panel: Recent Labs  Lab 10/01/17 0257 10/01/17 0652  10/04/17 0431 10/04/17 1407 10/05/17 0430  NA 128* 128*   < > 128* 126* 123*  K 5.7* 4.2   < > 5.0 4.8 4.1  CL 90* 88*   < > 85* 84* 80*  CO2 14* 18*   < > 29 29 28   GLUCOSE 186* 193*   < > 151* 176* 147*  BUN 133* 132*   < > 75* 67* 61*  CREATININE 2.80* 2.80*   < > 2.14* 2.26* 2.22*  CALCIUM 8.0* 7.7*   < > 8.5* 8.2* 8.1*  PHOS 8.2* 7.5*  --   --  3.9  --    < > = values in this interval not displayed.   Liver Function Tests: Recent Labs  Lab 10/03/17 0548 10/04/17 0431 10/04/17 1407 10/05/17 0430  AST 549* 497*  --  360*  ALT 908* 840*  --  680*  ALKPHOS 171* 190*  --  154*  BILITOT 1.8* 1.6*  --  1.3*  PROT  5.7* 5.8*  --  5.8*  ALBUMIN 2.5* 2.6* 2.6* 2.6*   No results for input(s): LIPASE, AMYLASE in the last 168 hours. No results for input(s): AMMONIA in the last 168 hours. CBC: Recent Labs  Lab 10/02/17 0412 10/03/17 0548 10/03/17 1354 10/04/17 0431 10/05/17 0430  WBC 11.6* 7.7 8.8 7.2 9.0  NEUTROABS 10.5* 7.0  --  6.1 8.3*  HGB 12.8* 12.3* 12.7* 12.6* 12.1*  HCT 37.7* 37.0* 37.8* 38.3* 36.7*  MCV 93.5 95.6 95.5 96.7 94.6  PLT 381 319 351 354 319   Cardiac Enzymes: No results for input(s): CKTOTAL, CKMB, CKMBINDEX, TROPONINI in the last 168 hours. CBG: No results for input(s): GLUCAP in the last 168 hours.  Iron Studies: No results for input(s): IRON, TIBC, TRANSFERRIN, FERRITIN in the last 72 hours. Studies/Results: No results found. Medications: Infusions: . sodium chloride 250 mL (10/04/17 2000)  . amiodarone 60 mg/hr (10/05/17 0300)  . cefTRIAXone (ROCEPHIN)  IV Stopped (10/05/17 4944)  . heparin  1,900 Units/hr (10/05/17 0646)  . milrinone 0.375 mcg/kg/min (10/05/17 0423)  . norepinephrine (LEVOPHED) Adult infusion 5 mcg/min (10/04/17 2000)    Scheduled Medications: . Chlorhexidine Gluconate Cloth  6 each Topical Daily  . feeding supplement (ENSURE ENLIVE)  237 mL Oral BID BM  . furosemide  80 mg Intravenous BID  . mouth rinse  15 mL Mouth Rinse BID  . sodium chloride flush  10-40 mL Intracatheter Q12H  . sodium chloride flush  3 mL Intravenous Q12H    have reviewed scheduled and prn medications.  Physical Exam: General:looks better still Heart: tachy, irreg Lungs: mostly clear Abdomen: soft, non tender Extremities: still with pitting edema but much improved Dialysis Access: none -    10/05/2017,7:40 AM  LOS: 5 days

## 2017-10-05 NOTE — Progress Notes (Addendum)
ANTICOAGULATION CONSULT NOTE   Pharmacy Consult for Heparin  Indication: atrial fibrillation  No Known Allergies  Vital Signs: Temp: 98.9 F (37.2 C) (01/26 0400) Temp Source: Oral (01/26 0400) BP: 101/87 (01/26 0600) Pulse Rate: 122 (01/26 0600)  Labs: Recent Labs    10/03/17 1354 10/04/17 0431 10/04/17 1407 10/05/17 0430  HGB 12.7* 12.6*  --  12.1*  HCT 37.8* 38.3*  --  36.7*  PLT 351 354  --  319  HEPARINUNFRC  --  0.65 0.29* 0.27*  CREATININE  --  2.14* 2.26* 2.22*    Estimated Creatinine Clearance: 34.3 mL/min (A) (by C-G formula based on SCr of 2.22 mg/dL (H)).   Medical History: Past Medical History:  Diagnosis Date  . Hypertension   . Inguinal hernia, bilateral    Assessment: 66yom with EF 25% known medication non-compliance.  Seen in ED acutely SOB volume overloaded > furosemide iv and Afib RVR start amiodarone drip.  Complicated with AKI Cr 2.5 K 6.2.  > improving with increased UOP  WBC up 17>7 CXR RLL opacity > start doxy and rocephin > strep pneumon pna and bacteremia  New ECHO 1/22 and confirmed 1/23 LV apical thrombus Heparin was decreased yesterday due blood in stool and overnight hematuria.  Now heparin is SUBtherapeutic, levels appear to be down-trending.    Goal of Therapy:  Heparin level 0.3-0.7 units/ml Monitor platelets by anticoagulation protocol: Yes    Plan:  Conservative increase in heparin to 1900 units/hr Confirmatory heparin level  Daily HL, CBC     Bryan Harmon  10/05/2017 6:38 AM

## 2017-10-05 NOTE — Progress Notes (Signed)
ANTICOAGULATION CONSULT NOTE   Pharmacy Consult for Heparin  Indication: atrial fibrillation  No Known Allergies  Vital Signs: Temp: 99.4 F (37.4 C) (01/26 0858) Temp Source: Oral (01/26 0858) BP: 110/82 (01/26 1400) Pulse Rate: 80 (01/26 1400)  Labs: Recent Labs    10/03/17 1354 10/04/17 0431 10/04/17 1407 10/05/17 0430 10/05/17 1328  HGB 12.7* 12.6*  --  12.1*  --   HCT 37.8* 38.3*  --  36.7*  --   PLT 351 354  --  319  --   HEPARINUNFRC  --  0.65 0.29* 0.27* 0.36  CREATININE  --  2.14* 2.26* 2.22*  --     Estimated Creatinine Clearance: 34.3 mL/min (A) (by C-G formula based on SCr of 2.22 mg/dL (H)).   Medical History: Past Medical History:  Diagnosis Date  . Hypertension   . Inguinal hernia, bilateral    Assessment: 66yom with LV apical thrombus on IV heparin. Pt noted to have hematuria and blood in stool. Heparin level subtherapeutic this morning so rate increased, repeat level therapeutic at 0.36. No new S/Sx bleeding per RN, CBC stable this am.  Goal of Therapy:  Heparin level 0.3-0.7 units/ml Monitor platelets by anticoagulation protocol: Yes    Plan:  -Continue heparin 1900 units/hr -Monitor heparin level, CBC, S/Sx bleeding daily  Fredonia Highland, PharmD, BCPS PGY-2 Cardiology Pharmacy Resident Pager: [phone] 10/05/2017

## 2017-10-06 DIAGNOSIS — E440 Moderate protein-calorie malnutrition: Secondary | ICD-10-CM

## 2017-10-06 LAB — CBC WITH DIFFERENTIAL/PLATELET
BASOS ABS: 0 10*3/uL (ref 0.0–0.1)
Basophils Relative: 0 %
EOS PCT: 0 %
Eosinophils Absolute: 0 10*3/uL (ref 0.0–0.7)
HEMATOCRIT: 32.9 % — AB (ref 39.0–52.0)
HEMOGLOBIN: 11.1 g/dL — AB (ref 13.0–17.0)
LYMPHS ABS: 0.5 10*3/uL — AB (ref 0.7–4.0)
Lymphocytes Relative: 8 %
MCH: 31.5 pg (ref 26.0–34.0)
MCHC: 33.7 g/dL (ref 30.0–36.0)
MCV: 93.5 fL (ref 78.0–100.0)
Monocytes Absolute: 0.3 10*3/uL (ref 0.1–1.0)
Monocytes Relative: 5 %
NEUTROS ABS: 5.2 10*3/uL (ref 1.7–7.7)
NEUTROS PCT: 87 %
PLATELETS: 220 10*3/uL (ref 150–400)
RBC: 3.52 MIL/uL — ABNORMAL LOW (ref 4.22–5.81)
RDW: 14.1 % (ref 11.5–15.5)
WBC: 6 10*3/uL (ref 4.0–10.5)

## 2017-10-06 LAB — COMPREHENSIVE METABOLIC PANEL
ALT: 484 U/L — ABNORMAL HIGH (ref 17–63)
ANION GAP: 14 (ref 5–15)
AST: 214 U/L — AB (ref 15–41)
Albumin: 2.3 g/dL — ABNORMAL LOW (ref 3.5–5.0)
Alkaline Phosphatase: 134 U/L — ABNORMAL HIGH (ref 38–126)
BILIRUBIN TOTAL: 1.2 mg/dL (ref 0.3–1.2)
BUN: 54 mg/dL — AB (ref 6–20)
CHLORIDE: 79 mmol/L — AB (ref 101–111)
CO2: 32 mmol/L (ref 22–32)
Calcium: 8 mg/dL — ABNORMAL LOW (ref 8.9–10.3)
Creatinine, Ser: 2.28 mg/dL — ABNORMAL HIGH (ref 0.61–1.24)
GFR calc Af Amer: 33 mL/min — ABNORMAL LOW (ref >60)
GFR, EST NON AFRICAN AMERICAN: 28 mL/min — AB (ref >60)
Glucose, Bld: 122 mg/dL — ABNORMAL HIGH (ref 65–99)
Potassium: 3.1 mmol/L — ABNORMAL LOW (ref 3.5–5.1)
Sodium: 125 mmol/L — ABNORMAL LOW (ref 135–145)
TOTAL PROTEIN: 5.4 g/dL — AB (ref 6.5–8.1)

## 2017-10-06 LAB — HEPARIN LEVEL (UNFRACTIONATED)
Heparin Unfractionated: 0.22 IU/mL — ABNORMAL LOW (ref 0.30–0.70)
Heparin Unfractionated: 0.33 IU/mL (ref 0.30–0.70)

## 2017-10-06 LAB — BASIC METABOLIC PANEL
Anion gap: 15 (ref 5–15)
BUN: 56 mg/dL — AB (ref 6–20)
CO2: 31 mmol/L (ref 22–32)
CREATININE: 2.13 mg/dL — AB (ref 0.61–1.24)
Calcium: 7.9 mg/dL — ABNORMAL LOW (ref 8.9–10.3)
Chloride: 77 mmol/L — ABNORMAL LOW (ref 101–111)
GFR calc Af Amer: 36 mL/min — ABNORMAL LOW (ref >60)
GFR, EST NON AFRICAN AMERICAN: 31 mL/min — AB (ref >60)
GLUCOSE: 118 mg/dL — AB (ref 65–99)
Potassium: 4 mmol/L (ref 3.5–5.1)
SODIUM: 123 mmol/L — AB (ref 135–145)

## 2017-10-06 LAB — COOXEMETRY PANEL
CARBOXYHEMOGLOBIN: 1.7 % — AB (ref 0.5–1.5)
Methemoglobin: 0.7 % (ref 0.0–1.5)
O2 Saturation: 69.8 %
Total hemoglobin: 10.7 g/dL — ABNORMAL LOW (ref 12.0–16.0)

## 2017-10-06 MED ORDER — SODIUM CHLORIDE 0.9% FLUSH: 3.0000 mL | INTRAVENOUS | Status: DC | PRN

## 2017-10-06 MED ORDER — POTASSIUM CHLORIDE CRYS ER 20 MEQ PO TBCR
40.0000 meq | EXTENDED_RELEASE_TABLET | Freq: Once | ORAL | Status: AC
  Administered 2017-10-06: 40 meq via ORAL
  Filled 2017-10-06: qty 2

## 2017-10-06 MED ORDER — SODIUM CHLORIDE 0.9% FLUSH
3.0000 mL | Freq: Two times a day (BID) | INTRAVENOUS | Status: DC
  Administered 2017-10-07: 3 mL via INTRAVENOUS

## 2017-10-06 MED ORDER — ENSURE ENLIVE PO LIQD
237.0000 mL | Freq: Two times a day (BID) | ORAL | Status: DC
  Administered 2017-10-06 – 2017-10-07 (×3): 237 mL via ORAL

## 2017-10-06 MED ORDER — TOLVAPTAN 15 MG PO TABS
15.0000 mg | ORAL_TABLET | Freq: Once | ORAL | Status: AC
  Administered 2017-10-06: 15 mg via ORAL
  Filled 2017-10-06: qty 1

## 2017-10-06 MED ORDER — SODIUM CHLORIDE 0.9 % IV SOLN: 250.0000 mL | INTRAVENOUS | Status: DC

## 2017-10-06 MED ORDER — POTASSIUM CHLORIDE 10 MEQ/50ML IV SOLN
10.0000 meq | INTRAVENOUS | Status: AC
  Administered 2017-10-06 (×3): 10 meq via INTRAVENOUS
  Filled 2017-10-06 (×3): qty 50

## 2017-10-06 NOTE — Progress Notes (Signed)
Subjective:   Remains on norepi- stable dose as wel as milrinone- HR still tachy but BP better than it was- UOP 5500 - net 2 liters neg on lasix per cards- most recent CVP reading as 15 - renal function stable- maybe for cardioversion tomorrow  Objective Vital signs in last 24 hours: Vitals:   10/06/17 0600 10/06/17 0700 10/06/17 0800 10/06/17 0808  BP:  103/88 112/82   Pulse: 78     Resp: 14 (!) 21 19   Temp:    98 F (36.7 C)  TempSrc:    Oral  SpO2: 93%  94%   Weight:      Height:       Weight change: 0.1 kg (3.5 oz)  Intake/Output Summary (Last 24 hours) at 10/06/2017 0818 Last data filed at 10/06/2017 0700 Gross per 24 hour  Intake 3217.92 ml  Output 5500 ml  Net -2282.08 ml    Assessment/Plan: 67 year old white male with cardiomyopathy on no medications due to noncompliance -he now presents with A. fib and RVR in the setting of probable pneumonia with decompensated heart failure and acute kidney injury 1.Renal-normal creatinine 7 months ago.  Now with acute kidney injury, suspect is secondary to a hemodynamic cause-prolonged A. fib with decreased renal perfusion.  renal ultrasound OK, U/A neg- goes along with hemodynamic issue. Was improving then stalled likely due to hypotension/ATN. Need to continue to support hemodynamically to help renal perfusion. No indications for HD at present.  I would support him in the short-term with dialysis-but past behavior (no meds for 7 10 months) would indicate that he is a very poor candidate for chronic dialysis therapy.  2. Hypertension/volume  -he is volume overloaded.  Decompensated heart failure secondary to A. fib.  Cardiology on board and efforts underway to slow heart rate down and improve BP- agreed with lasix initially but once started opening up did not need.  Now needs again to assist with diuresis- per cardiology- have been able to maintain reasonable UOP and negative fluid balance.  Hyponatremia indicative of volume overload- is  improving   3.  Uremia-initially BUN was so high out of proportion to the creatinine.  This is better.    4. Anemia  -not an issue at the moment 5.  Hyperkalemia- hypo was on repletion- then up again due to ATN and correction- stopped all supplemental K - went down again last evening- being repleted by cards      Bryan Harmon A    Labs: Basic Metabolic Panel: Recent Labs  Lab 10/01/17 0257 10/01/17 0652  10/04/17 1407 10/05/17 0430 10/05/17 1600 10/06/17 0511  NA 128* 128*   < > 126* 123* 123* 125*  K 5.7* 4.2   < > 4.8 4.1 2.8* 3.1*  CL 90* 88*   < > 84* 80* 77* 79*  CO2 14* 18*   < > 29 28 30  32  GLUCOSE 186* 193*   < > 176* 147* 147* 122*  BUN 133* 132*   < > 67* 61* 57* 54*  CREATININE 2.80* 2.80*   < > 2.26* 2.22* 2.24* 2.28*  CALCIUM 8.0* 7.7*   < > 8.2* 8.1* 7.8* 8.0*  PHOS 8.2* 7.5*  --  3.9  --   --   --    < > = values in this interval not displayed.   Liver Function Tests: Recent Labs  Lab 10/04/17 0431 10/04/17 1407 10/05/17 0430 10/06/17 0511  AST 497*  --  360* 214*  ALT 840*  --  680* 484*  ALKPHOS 190*  --  154* 134*  BILITOT 1.6*  --  1.3* 1.2  PROT 5.8*  --  5.8* 5.4*  ALBUMIN 2.6* 2.6* 2.6* 2.3*   No results for input(s): LIPASE, AMYLASE in the last 168 hours. No results for input(s): AMMONIA in the last 168 hours. CBC: Recent Labs  Lab 10/03/17 0548 10/03/17 1354 10/04/17 0431 10/05/17 0430 10/06/17 0511  WBC 7.7 8.8 7.2 9.0 6.0  NEUTROABS 7.0  --  6.1 8.3* 5.2  HGB 12.3* 12.7* 12.6* 12.1* 11.1*  HCT 37.0* 37.8* 38.3* 36.7* 32.9*  MCV 95.6 95.5 96.7 94.6 93.5  PLT 319 351 354 319 220   Cardiac Enzymes: No results for input(s): CKTOTAL, CKMB, CKMBINDEX, TROPONINI in the last 168 hours. CBG: No results for input(s): GLUCAP in the last 168 hours.  Iron Studies: No results for input(s): IRON, TIBC, TRANSFERRIN, FERRITIN in the last 72 hours. Studies/Results: Dg Chest Port 1 View  Result Date: 10/05/2017 CLINICAL DATA:   Acute hypoxic respiratory failure. Pneumonia. Inpatient. EXAM: PORTABLE CHEST 1 VIEW COMPARISON:  Single-view of the chest 10/02/2017 and 10/01/2017. FINDINGS: Right subclavian central venous catheter with its tip across the midline in the left brachiocephalic vein is unchanged. There is massive cardiomegaly without edema. Right lower lobe airspace disease appears somewhat worse than on the prior exams. No pneumothorax or pleural fluid. IMPRESSION: Worsened appearance of right lower lobe airspace disease consistent with pneumonia. Marked cardiomegaly without edema. Right subclavian central venous catheter tip remains in the left brachiocephalic vein. Electronically Signed   By: Drusilla Kanner M.D.   On: 10/05/2017 08:55   Medications: Infusions: . sodium chloride Stopped (10/05/17 2000)  . sodium chloride    . amiodarone 60 mg/hr (10/06/17 0510)  . cefTRIAXone (ROCEPHIN)  IV Stopped (10/06/17 0547)  . furosemide 120 mg (10/06/17 0751)  . heparin 1,950 Units/hr (10/06/17 0646)  . milrinone 0.375 mcg/kg/min (10/05/17 2147)  . norepinephrine (LEVOPHED) Adult infusion 5 mcg/min (10/05/17 2149)  . potassium chloride 10 mEq (10/06/17 0751)    Scheduled Medications: . Chlorhexidine Gluconate Cloth  6 each Topical Daily  . feeding supplement (ENSURE ENLIVE)  237 mL Oral BID BM  . mouth rinse  15 mL Mouth Rinse BID  . potassium chloride  40 mEq Oral Once  . sodium chloride flush  10-40 mL Intracatheter Q12H  . sodium chloride flush  3 mL Intravenous Q12H  . sodium chloride flush  3 mL Intravenous Q12H  . tolvaptan  15 mg Oral Once    have reviewed scheduled and prn medications.  Physical Exam: General:looks better still Heart: tachy, irreg Lungs: mostly clear Abdomen: soft, non tender Extremities: still with pitting edema but much improved Dialysis Access: none -    10/06/2017,8:18 AM  LOS: 6 days

## 2017-10-06 NOTE — Progress Notes (Signed)
Patient ID: Bryan Harmon, male   DOB: [birth year], 67 y.o.   MRN: [id number]     Advanced Heart Failure Rounding Note  PCP:  Primary Cardiologist: [name]   Subjective:    Milrinone 0.375 mcg/kg/min currently with norepinephrine 5.  Co-ox good today at 70%, I/O negative with BUN/creatinine stable at 54/2.28.  CVP 15 today.   He appears comfortable on room air, up to chair yesterday.   HR in 110s atrial fibrillation at rest, remains on amiodarone 60 mg/hr.  HR increased to 130s-140s when he moved from bed to chair.  SBP 100s generally, no hypotensive episodes.    Respiratory virus screen: Parainfluenza virus.  Bld CX- Strep pneumoniae--> IV rocephin.  CXR with RLL PNA.     Echo: Severe LV dilation, EF 15-20%, severely dilated RV with severely decreased RV systolic function, moderate-severe MR, moderate-severe TR, cannot rule out LV apical thrombus.  Repeat echo with Definity showed definite LV apical thrombus.   Objective:   Weight Range: 182 lb 15.7 oz (83 kg) Body mass index is 26.26 kg/m.   Vital Signs:   Temp:  [98 F (36.7 C)-99.4 F (37.4 C)] 98 F (36.7 C) (01/27 0300) Pulse Rate:  [45-132] 78 (01/27 0600) Resp:  [12-27] 21 (01/27 0700) BP: (80-118)/(57-92) 103/88 (01/27 0700) SpO2:  [88 %-100 %] 93 % (01/27 0600) Weight:  [182 lb 15.7 oz (83 kg)] 182 lb 15.7 oz (83 kg) (01/27 0500) Last BM Date: 10/03/17  Weight change: Filed Weights   10/04/17 0437 10/05/17 0438 10/06/17 0500  Weight: 177 lb 11.1 oz (80.6 kg) 182 lb 12.2 oz (82.9 kg) 182 lb 15.7 oz (83 kg)    Intake/Output:   Intake/Output Summary (Last 24 hours) at 10/06/2017 0750 Last data filed at 10/06/2017 0700 Gross per 24 hour  Intake 3298.52 ml  Output 5500 ml  Net -2201.48 ml      Physical Exam  CVP 15.  General: NAD Neck: JVP 14+ cm, no thyromegaly or thyroid nodule.  Lungs: Decreased breath sounds on right.  CV: Lateral PMI.  Heart tachy, irregular S1/S2, no S3/S4, 1/6 HSM LLSB.  1+  ankle edema.   Abdomen: Soft, nontender, no hepatosplenomegaly, no distention.  Skin: Intact without lesions or rashes.  Neurologic: Alert and oriented x 3.  Psych: Normal affect. Extremities: No clubbing or cyanosis.  HEENT: Normal.    Telemetry  Afib 110s personally reviewed.   EKG    n/a  Labs    CBC Recent Labs    10/05/17 0430 10/06/17 0511  WBC 9.0 6.0  NEUTROABS 8.3* 5.2  HGB 12.1* 11.1*  HCT 36.7* 32.9*  MCV 94.6 93.5  PLT 319 220   Basic Metabolic Panel Recent Labs    40/98/11 1407  10/05/17 1600 10/06/17 0511  NA 126*   < > 123* 125*  K 4.8   < > 2.8* 3.1*  CL 84*   < > 77* 79*  CO2 29   < > 30 32  GLUCOSE 176*   < > 147* 122*  BUN 67*   < > 57* 54*  CREATININE 2.26*   < > 2.24* 2.28*  CALCIUM 8.2*   < > 7.8* 8.0*  PHOS 3.9  --   --   --    < > = values in this interval not displayed.   Liver Function Tests Recent Labs    10/05/17 0430 10/06/17 0511  AST 360* 214*  ALT 680* 484*  ALKPHOS 154* 134*  BILITOT 1.3*  1.2  PROT 5.8* 5.4*  ALBUMIN 2.6* 2.3*   No results for input(s): LIPASE, AMYLASE in the last 72 hours. Cardiac Enzymes No results for input(s): CKTOTAL, CKMB, CKMBINDEX, TROPONINI in the last 72 hours.  BNP: BNP (last 3 results) Recent Labs    12/13/16 0949 02/11/17 1049 10/03/2017 1218  BNP 369.2* 562.7* 2,485.9*    ProBNP (last 3 results) No results for input(s): PROBNP in the last 8760 hours.   D-Dimer No results for input(s): DDIMER in the last 72 hours. Hemoglobin A1C No results for input(s): HGBA1C in the last 72 hours. Fasting Lipid Panel No results for input(s): CHOL, HDL, LDLCALC, TRIG, CHOLHDL, LDLDIRECT in the last 72 hours. Thyroid Function Tests No results for input(s): TSH, T4TOTAL, T3FREE, THYROIDAB in the last 72 hours.  Invalid input(s): FREET3  Other results:   Imaging    Dg Chest Port 1 View  Result Date: 10/05/2017 CLINICAL DATA:  Acute hypoxic respiratory failure. Pneumonia. Inpatient.  EXAM: PORTABLE CHEST 1 VIEW COMPARISON:  Single-view of the chest 10/02/2017 and 10/01/2017. FINDINGS: Right subclavian central venous catheter with its tip across the midline in the left brachiocephalic vein is unchanged. There is massive cardiomegaly without edema. Right lower lobe airspace disease appears somewhat worse than on the prior exams. No pneumothorax or pleural fluid. IMPRESSION: Worsened appearance of right lower lobe airspace disease consistent with pneumonia. Marked cardiomegaly without edema. Right subclavian central venous catheter tip remains in the left brachiocephalic vein. Electronically Signed   By: Drusilla Kanner M.D.   On: 10/05/2017 08:55     Medications:     Scheduled Medications: . Chlorhexidine Gluconate Cloth  6 each Topical Daily  . feeding supplement (ENSURE ENLIVE)  237 mL Oral BID BM  . mouth rinse  15 mL Mouth Rinse BID  . potassium chloride  40 mEq Oral Once  . potassium chloride  40 mEq Oral Once  . sodium chloride flush  10-40 mL Intracatheter Q12H  . sodium chloride flush  3 mL Intravenous Q12H  . tolvaptan  15 mg Oral Once    Infusions: . sodium chloride Stopped (10/05/17 2000)  . amiodarone 60 mg/hr (10/06/17 0510)  . cefTRIAXone (ROCEPHIN)  IV Stopped (10/06/17 0547)  . furosemide Stopped (10/05/17 2117)  . heparin 1,950 Units/hr (10/06/17 0646)  . milrinone 0.375 mcg/kg/min (10/05/17 2147)  . norepinephrine (LEVOPHED) Adult infusion 5 mcg/min (10/05/17 2149)  . potassium chloride      PRN Medications: sodium chloride, acetaminophen, albuterol, ALPRAZolam, ondansetron (ZOFRAN) IV, sodium chloride flush, sodium chloride flush    Patient Profile  Mr [name] is a 67 year old with a history of PAF S/P DC-CV 11/2016, s/p bilateral inguinal hernia repair 11/2017, PVCs, HTN, NICM, chronic systolic heart failure.   Sent from Urgent Care with A fib RVR. Acutely SOB on arrival   Assessment/Plan   1. Acute hypoxemic respiratory failure: RLL  PNA, Strep pneumoniae in blood cultures. Respiratory cultures with parainfluenzae virus. Also acute/chronic systolic CHF.He diuresed well with IV Lasix + tolvaptan yesterday, renal parameters stable.  -Covering withceftriaxone currently, now off doxycycline.  -CVP 15, will need to continue diuresis.  1.Acute on chronic systolic CHF: Nonischemic cardiomyopathy. 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 Etiology of his CHF is not clear =>no definite inciting event. He has a history of HTN but doubt this was the only trigger. 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 This would ideally be confirmed by cMRI, but he has  not wanted an MRI (concerned about side effects). He also has frequent PVCs, 21% total on last holter in 4/18 which is a risk for fall in EF.No family history of CMP. Cannot rule out viral myocarditis. SPEP negative. With medical management, he initially felt a lot better. However, he quit all his meds in early 2018 with recurrence of NYHA III symptoms and onset of atrial fibrillation.He had TEE-DCCV in 3/18.TEE showed that EF remained25%. He quit his meds again in 4/18 and apparently did not restart them when I asked him to in 6/18. No meds probably since 4/18. Echo this admission with EF 15-20%, severe RV dysfunction, LV apical thrombus. He is now on milrinone 0.375 + norepinephrine 5. Co-ox good at 70% this morning with CVP 15 and JVD on exam. He diuresed well yesterday with IV Lasix + tolvaptan.  BUN/creatinine stable, SBP in 100s.  - Continue Lasix 120 mg IV bid today.  -Will give dose of tolvaptan 15 mg again today, see below.  - Continue current milrinone + norepinephrine.  -ControlHR with amiodarone gtt (better around 110) but ultimately needs NSR as HR up to 130s-140s with any movement.  If he diureses well again today, will plan TEE-DCCV tomorrow at bedside.  3. 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 It is possible that the PVCs contribute to his cardiomyopathy. -Onamiodarone.  4. Atrial fibrillation:Persistent, now with RVR. HR up to 140sinitially, now 110s on amiodarone gtt at60. Not sure how long he has been in atrial fibrillation with marked RVR, but this may have triggered his CHF and AKI due to worsening of cardiac output.  - Need to slow HR,continueamiodarone gtt at60mg /hrfor now (HR better around 110).  -Onheparin gtt for anticoagulation with AKI.  - Would like to cardiovert.  However, he has an LV apical thrombus and we have not checked for LAA thrombus as he has not been stable for TEE.  Based on limited available data, DCCV can be done with LV thrombus (only data is on cohort of 21 pts from 2006).  However, have not ruled out LA appendage thrombus.  If he diureses well again today, think it would be reasonable to undertake TEE-guided DCCV tomorrow morning.  I discussed the risks and benefits with patient and family.  Risk will be higher than usual, but I think it is going to be imperative to get him back in NSR as his rate is very difficult to control.  He is willing to undertake procedure. 5. AKI: Creatinine 1.08 in 6/18, he has not been on any meds. Suspect cardiorenal syndrome with afib/RVR and fall in cardiac output.He was initially hyperkalemic and acidotic.BUN/creatinine still high (creatinine 2.28) but stable today. -Nephrology following.  - Will try to maintain cardiac output and BP to avoid further hemodynamic damage.  6. ZO:XWRUE pneumo PNA as well as parainfluenza virus. - Ceftriaxone continues, off doxycycline. 7. Elevated LFTs: Suspect shock liver type picture, AST/ALT > 1000 at admission, now trending down steadily. 8. LV thrombus: On heparin gtt.    9. Hyponatremia: Hypervolemic hyponatremia, will give dose of tolvaptan again today.  10. Malnutrition: Add Ensure, encourage po.   CRITICAL CARE Performed  AV:[id number] Vishnu Moeller  Total critical care time:48minutes  Critical care time was exclusive of separately billable procedures and treating other patients.  Critical care was necessary to treat or prevent imminent or life-threatening deterioration.  Critical care was time spent personally by me on the following activities: development of treatment plan with patient and/or surrogate as  well as nursing, discussions with consultants, evaluation of patient's response to treatment, examination of patient, obtaining history from patient or surrogate, ordering and performing treatments and interventions, ordering and review of laboratory studies, ordering and review of radiographic studies, pulse oximetry and re-evaluation of patient's condition.  Marca Ancona 10/06/2017 7:50 AM

## 2017-10-06 NOTE — Progress Notes (Signed)
ANTICOAGULATION CONSULT NOTE - Follow Up Consult  Pharmacy Consult for heparin Indication: Afib and LV thrombus  Labs: Recent Labs    10/04/17 0431 10/04/17 1407 10/05/17 0430 10/05/17 1328 10/05/17 1600 10/06/17 0511  HGB 12.6*  --  12.1*  --   --  11.1*  HCT 38.3*  --  36.7*  --   --  32.9*  PLT 354  --  319  --   --  220  HEPARINUNFRC 0.65 0.29* 0.27* 0.36  --  0.22*  CREATININE 2.14* 2.26* 2.22*  --  2.24*  --     Assessment: 67yo male now subtherapeutic on heparin after one level at lower end of goal, Hgb now down 1 point; RN notes no overt signs of bleeding.  Goal of Therapy:  Heparin level 0.3-0.7 units/ml   Plan:  Will increase heparin gtt by to previously therapeutic rate of 1950 units/hr and check level in 8 hours.   Vernard Gambles, PharmD, BCPS  10/06/2017,6:22 AM

## 2017-10-06 NOTE — Progress Notes (Signed)
ANTICOAGULATION CONSULT NOTE   Pharmacy Consult for Heparin  Indication: atrial fibrillation  No Known Allergies  Vital Signs: Temp: 98.2 F (36.8 C) (01/27 1211) Temp Source: Oral (01/27 1211) BP: 86/63 (01/27 1300) Pulse Rate: 116 (01/27 1300)  Labs: Recent Labs    10/04/17 0431  10/05/17 0430 10/05/17 1328 10/05/17 1600 10/06/17 0511 10/06/17 1503  HGB 12.6*  --  12.1*  --   --  11.1*  --   HCT 38.3*  --  36.7*  --   --  32.9*  --   PLT 354  --  319  --   --  220  --   HEPARINUNFRC 0.65   < > 0.27* 0.36  --  0.22* 0.33  CREATININE 2.14*   < > 2.22*  --  2.24* 2.28*  --    < > = values in this interval not displayed.    Estimated Creatinine Clearance: 33.4 mL/min (A) (by C-G formula based on SCr of 2.28 mg/dL (H)).  Assessment: 66yom with LV apical thrombus on IV heparin. Pt noted to have hematuria and blood in stool. Heparin level is now therapeutic at 0.33. No bleeding noted. CBC is stable.   Goal of Therapy:  Heparin level 0.3-0.7 units/ml Monitor platelets by anticoagulation protocol: Yes   Plan:  Continue heparin gtt 1950 units/hr Confirm dosing with AM heparin level Daily heparin level and CBC  Lysle Pearl, PharmD, BCPS 10/06/2017 3:36 PM

## 2017-10-07 ENCOUNTER — Inpatient Hospital Stay (HOSPITAL_COMMUNITY): Payer: Medicare Other | Admitting: Anesthesiology

## 2017-10-07 ENCOUNTER — Inpatient Hospital Stay (HOSPITAL_COMMUNITY): Payer: Medicare Other | Admitting: Certified Registered Nurse Anesthetist

## 2017-10-07 ENCOUNTER — Encounter (HOSPITAL_COMMUNITY): Admission: EM | Disposition: E | Payer: Self-pay | Source: Home / Self Care | Attending: Cardiology

## 2017-10-07 ENCOUNTER — Encounter (HOSPITAL_COMMUNITY): Payer: Self-pay | Admitting: *Deleted

## 2017-10-07 ENCOUNTER — Inpatient Hospital Stay (HOSPITAL_COMMUNITY): Payer: Medicare Other

## 2017-10-07 DIAGNOSIS — I340 Nonrheumatic mitral (valve) insufficiency: Secondary | ICD-10-CM

## 2017-10-07 DIAGNOSIS — I4891 Unspecified atrial fibrillation: Secondary | ICD-10-CM

## 2017-10-07 HISTORY — PX: CARDIOVERSION: SHX1299

## 2017-10-07 HISTORY — PX: TEE WITHOUT CARDIOVERSION: SHX5443

## 2017-10-07 LAB — RENAL FUNCTION PANEL
Albumin: 2.3 g/dL — ABNORMAL LOW (ref 3.5–5.0)
Anion gap: 16 — ABNORMAL HIGH (ref 5–15)
BUN: 51 mg/dL — AB (ref 6–20)
CHLORIDE: 77 mmol/L — AB (ref 101–111)
CO2: 32 mmol/L (ref 22–32)
CREATININE: 2.2 mg/dL — AB (ref 0.61–1.24)
Calcium: 7.9 mg/dL — ABNORMAL LOW (ref 8.9–10.3)
GFR calc Af Amer: 34 mL/min — ABNORMAL LOW (ref >60)
GFR calc non Af Amer: 30 mL/min — ABNORMAL LOW (ref >60)
GLUCOSE: 108 mg/dL — AB (ref 65–99)
POTASSIUM: 2.9 mmol/L — AB (ref 3.5–5.1)
Phosphorus: 3.4 mg/dL (ref 2.5–4.6)
Sodium: 125 mmol/L — ABNORMAL LOW (ref 135–145)

## 2017-10-07 LAB — BASIC METABOLIC PANEL
ANION GAP: 14 (ref 5–15)
BUN: 51 mg/dL — AB (ref 6–20)
CHLORIDE: 79 mmol/L — AB (ref 101–111)
CO2: 31 mmol/L (ref 22–32)
Calcium: 7.8 mg/dL — ABNORMAL LOW (ref 8.9–10.3)
Creatinine, Ser: 2.37 mg/dL — ABNORMAL HIGH (ref 0.61–1.24)
GFR calc Af Amer: 31 mL/min — ABNORMAL LOW (ref >60)
GFR calc non Af Amer: 27 mL/min — ABNORMAL LOW (ref >60)
GLUCOSE: 180 mg/dL — AB (ref 65–99)
Potassium: 3.5 mmol/L (ref 3.5–5.1)
Sodium: 124 mmol/L — ABNORMAL LOW (ref 135–145)

## 2017-10-07 LAB — HEPARIN LEVEL (UNFRACTIONATED)
HEPARIN UNFRACTIONATED: 0.35 [IU]/mL (ref 0.30–0.70)
Heparin Unfractionated: 0.18 IU/mL — ABNORMAL LOW (ref 0.30–0.70)
Heparin Unfractionated: 0.39 IU/mL (ref 0.30–0.70)

## 2017-10-07 LAB — CBC
HCT: 32.7 % — ABNORMAL LOW (ref 39.0–52.0)
HEMOGLOBIN: 11 g/dL — AB (ref 13.0–17.0)
MCH: 31.3 pg (ref 26.0–34.0)
MCHC: 33.6 g/dL (ref 30.0–36.0)
MCV: 93.2 fL (ref 78.0–100.0)
PLATELETS: 198 10*3/uL (ref 150–400)
RBC: 3.51 MIL/uL — AB (ref 4.22–5.81)
RDW: 14 % (ref 11.5–15.5)
WBC: 6.4 10*3/uL (ref 4.0–10.5)

## 2017-10-07 LAB — COOXEMETRY PANEL
Carboxyhemoglobin: 1.3 % (ref 0.5–1.5)
Methemoglobin: 1 % (ref 0.0–1.5)
O2 Saturation: 69.2 %
Total hemoglobin: 11.4 g/dL — ABNORMAL LOW (ref 12.0–16.0)

## 2017-10-07 LAB — PROCALCITONIN
PROCALCITONIN: 4.72 ng/mL
Procalcitonin: 4.95 ng/mL

## 2017-10-07 SURGERY — ECHOCARDIOGRAM, TRANSESOPHAGEAL
Anesthesia: General

## 2017-10-07 MED ORDER — SODIUM CHLORIDE 0.9 % IV SOLN: 250.0000 mL | INTRAVENOUS | Status: DC | PRN

## 2017-10-07 MED ORDER — AMIODARONE IV BOLUS ONLY 150 MG/100ML
INTRAVENOUS | Status: DC | PRN
  Administered 2017-10-07: 150 mg via INTRAVENOUS

## 2017-10-07 MED ORDER — PROPOFOL 10 MG/ML IV BOLUS
INTRAVENOUS | Status: DC | PRN
  Administered 2017-10-07: 10 mg via INTRAVENOUS

## 2017-10-07 MED ORDER — SODIUM CHLORIDE 0.9% FLUSH: 3.0000 mL | INTRAVENOUS | Status: DC | PRN

## 2017-10-07 MED ORDER — DEXMEDETOMIDINE HCL 200 MCG/2ML IV SOLN
INTRAVENOUS | Status: DC | PRN
  Administered 2017-10-07: 12 ug via INTRAVENOUS

## 2017-10-07 MED ORDER — POTASSIUM CHLORIDE CRYS ER 20 MEQ PO TBCR
40.0000 meq | EXTENDED_RELEASE_TABLET | Freq: Once | ORAL | Status: AC
  Administered 2017-10-07: 40 meq via ORAL
  Filled 2017-10-07: qty 2

## 2017-10-07 MED ORDER — PHENYLEPHRINE 40 MCG/ML (10ML) SYRINGE FOR IV PUSH (FOR BLOOD PRESSURE SUPPORT)
PREFILLED_SYRINGE | INTRAVENOUS | Status: DC | PRN
  Administered 2017-10-07: 40 ug via INTRAVENOUS

## 2017-10-07 MED ORDER — TOLVAPTAN 15 MG PO TABS
15.0000 mg | ORAL_TABLET | Freq: Once | ORAL | Status: AC
  Administered 2017-10-07: 15 mg via ORAL
  Filled 2017-10-07: qty 1

## 2017-10-07 MED ORDER — PROPOFOL 500 MG/50ML IV EMUL
INTRAVENOUS | Status: DC | PRN
  Administered 2017-10-07: 25 ug/kg/min via INTRAVENOUS

## 2017-10-07 MED ORDER — SODIUM CHLORIDE 0.9% FLUSH
3.0000 mL | Freq: Two times a day (BID) | INTRAVENOUS | Status: DC
  Administered 2017-10-07 – 2017-10-08 (×2): 3 mL via INTRAVENOUS

## 2017-10-07 MED ORDER — MIDAZOLAM HCL 2 MG/2ML IJ SOLN
INTRAMUSCULAR | Status: AC
  Filled 2017-10-07: qty 2

## 2017-10-07 MED ORDER — POTASSIUM CHLORIDE 10 MEQ/50ML IV SOLN
INTRAVENOUS | Status: AC
  Filled 2017-10-07: qty 50

## 2017-10-07 MED ORDER — BUTAMBEN-TETRACAINE-BENZOCAINE 2-2-14 % EX AERO
INHALATION_SPRAY | CUTANEOUS | Status: DC | PRN
  Administered 2017-10-07: 2 via TOPICAL

## 2017-10-07 MED ORDER — SODIUM CHLORIDE 0.9% FLUSH
3.0000 mL | Freq: Two times a day (BID) | INTRAVENOUS | Status: DC
  Administered 2017-10-07: 3 mL via INTRAVENOUS

## 2017-10-07 MED ORDER — POTASSIUM CHLORIDE 10 MEQ/50ML IV SOLN
10.0000 meq | INTRAVENOUS | Status: AC
  Administered 2017-10-07 (×4): 10 meq via INTRAVENOUS
  Filled 2017-10-07 (×3): qty 50

## 2017-10-07 MED ORDER — SODIUM CHLORIDE 0.9 % IV SOLN
250.0000 mL | INTRAVENOUS | Status: DC
  Administered 2017-10-07: 250 mL via INTRAVENOUS

## 2017-10-07 MED ORDER — POTASSIUM CHLORIDE CRYS ER 20 MEQ PO TBCR
40.0000 meq | EXTENDED_RELEASE_TABLET | Freq: Once | ORAL | Status: DC
  Filled 2017-10-07: qty 2

## 2017-10-07 MED ORDER — SODIUM CHLORIDE 0.9 % IV SOLN
INTRAVENOUS | Status: DC
  Administered 2017-10-08: 06:00:00 via INTRAVENOUS

## 2017-10-07 MED ORDER — SODIUM CHLORIDE 0.9 % IV SOLN
INTRAVENOUS | Status: DC | PRN
  Administered 2017-10-07: 10:00:00 via INTRAVENOUS

## 2017-10-07 MED ORDER — FUROSEMIDE 10 MG/ML IJ SOLN
120.0000 mg | Freq: Four times a day (QID) | INTRAMUSCULAR | Status: DC
  Administered 2017-10-07 – 2017-10-08 (×3): 120 mg via INTRAVENOUS
  Filled 2017-10-07 (×5): qty 12

## 2017-10-07 MED ORDER — POTASSIUM CHLORIDE 20 MEQ PO PACK
20.0000 meq | PACK | Freq: Four times a day (QID) | ORAL | Status: DC
  Administered 2017-10-07 (×2): 20 meq via ORAL
  Administered 2017-10-08: 40 meq via ORAL
  Administered 2017-10-08 – 2017-10-09 (×2): 20 meq via ORAL
  Filled 2017-10-07 (×11): qty 1

## 2017-10-07 MED ORDER — SODIUM CHLORIDE 0.9 % IV SOLN: INTRAVENOUS | Status: DC

## 2017-10-07 MED ORDER — MIDAZOLAM HCL 2 MG/2ML IJ SOLN
INTRAMUSCULAR | Status: DC | PRN
  Administered 2017-10-07: 1 mg via INTRAVENOUS

## 2017-10-07 NOTE — Progress Notes (Addendum)
PULMONARY / CRITICAL CARE MEDICINE   Name: Bryan Harmon MRN: [id number] DOB: Jan 25, 1951    ADMISSION DATE:  10-04-17  CHIEF COMPLAINT: extreme fatigue  HISTORY OF PRESENT ILLNESS:        This is a 67 year old with a nonischemic cardiomyopathy and a last known ejection fraction of 20-25% associated with significant mitral regurgitation.  He also has a history of intermittent atrial fibrillation.  He presented this morning with complaints of extreme fatigue and he was found to be in atrial fibrillation with a rapid ventricular response.  In addition there is a subtle suggestion of a right lower lobe infiltrate on his chest x-ray.  Of note he is not aware when he is in atrial fibrillation.  He has not had any sense of palpitations or syncope.  He traces the beginning of his fatigue to approximately 8 days ago when he was suffering from watery diarrhea and intermittent subjective fever.  There was no blood in the stool and there was no associated abdominal pain.  He has not been taking any of his medications.  He does not feel as though his weight has increased or his dependent edema is greater than usual.  He is not suffering from more than usual orthopnea.  Pertinent to the subtle right lower lobe infiltrate he has not had any cough. Laboratory studies have shown an elevated lactic acid and elevated white count.  He also has had a significant rise in his creatinine from baseline and is hyperkalemic with a bicarbonate of only 12  Treatment in the department of emergency medicine has included an amiodarone infusion, doxycycline, Rocephin, Kayexalate, and a heparin infusion  SUBJECTIVE:  No complaints. Remains in shock on pressors. Needing 3L [place]  VITAL SIGNS: BP 97/84   Pulse (!) 121   Temp 98.1 F (36.7 C) (Oral)   Resp (!) 26   Ht 5\' 10"  (1.778 m)   Wt 83.1 kg (183 lb 3.2 oz)   SpO2 98%   BMI 26.29 kg/m   HEMODYNAMICS: CVP:  [9 mmHg-10 mmHg] 9 mmHg     10 mcg  levophed    Levophed off   VENTILATOR SETTINGS: FiO2 (%):  [28 %] 28 %  INTAKE / OUTPUT: I/O last 3 completed shifts: In: 4681.9 [P.O.:1110; I.V.:3185.9; IV Piggyback:386] Out: 6900 [Urine:6900]  PHYSICAL EXAMINATION: General: Chronically ill appearing male, NAD Neuro: Alert and interactive, moving all ext to command Cardiovascular: RRR, no MRG. Pos JVD Lungs:Diminished bases Abdomen: Soft, NT, ND and +BS Skin: Warm dry and intact, cool to touch  LABS:  BMET Recent Labs  Lab 10/06/17 0511 10/06/17 1726 09/22/2017 0448  NA 125* 123* 125*  K 3.1* 4.0 2.9*  CL 79* 77* 77*  CO2 32 31 32  BUN 54* 56* 51*  CREATININE 2.28* 2.13* 2.20*  GLUCOSE 122* 118* 108*   Electrolytes Recent Labs  Lab 10/01/17 0652  10/04/17 1407  10/06/17 0511 10/06/17 1726 10/05/2017 0448  CALCIUM 7.7*   < > 8.2*   < > 8.0* 7.9* 7.9*  PHOS 7.5*  --  3.9  --   --   --  3.4   < > = values in this interval not displayed.   CBC Recent Labs  Lab 10/05/17 0430 10/06/17 0511 09/24/2017 1111  WBC 9.0 6.0 6.4  HGB 12.1* 11.1* 11.0*  HCT 36.7* 32.9* 32.7*  PLT 319 220 198   Coag's No results for input(s): APTT, INR in the last 168 hours.  Sepsis Markers Recent Labs  Lab 09/29/2017 1712 10/01/17 0257 10/02/17 0412 09/14/2017 1111  LATICACIDVEN 5.1*  --   --   --   PROCALCITON 3.38 3.70 4.06 4.95   ABG Recent Labs  Lab 09/29/2017 2308  PHART 7.352  PCO2ART 28.4*  PO2ART 101.0   Liver Enzymes Recent Labs  Lab 10/04/17 0431  10/05/17 0430 10/06/17 0511 10/06/2017 0448  AST 497*  --  360* 214*  --   ALT 840*  --  680* 484*  --   ALKPHOS 190*  --  154* 134*  --   BILITOT 1.6*  --  1.3* 1.2  --   ALBUMIN 2.6*   < > 2.6* 2.3* 2.3*   < > = values in this interval not displayed.   Cardiac Enzymes No results for input(s): TROPONINI, PROBNP in the last 168 hours.  Glucose No results for input(s): GLUCAP in the last 168 hours.  Imaging No results found. Lines/ Tubes 1/22  CVC  Cultures: 1/21 Blood>>Strep Pneumoniae pan sens 1/21 Resp Viral Panel>> + Parainfluenza Virus 3  ABX:  1/21>> Rocephin >>> 1/21>> Doxy 1/23  Studies: 10/01/2017>> Echo EF 15-20%, [place] ? PAP muscle vs. Thrombus, LV severely dilated, moderate concentric hypertrophy, systolic function normal, unable to evaluate LV diastolic function due to atrial fibrillation. + moderate spontaneous echo contrast, indicative of stasis Impression 1/23 Echo > Severely dilated LV with severe LV dysfunction globally with EF 15-20%. Severely dialted RV with severe RV dysfunction. Moderate   to severe MR with ERO 0.33cm2 and MR volume 41ml. Moderate to severe TR with moderate pulmonary HTN. Moderately thickened and calcified AV leaflets with mild MR, mildly dilated aortic root, massive biatrial enlargement. Cannot rule out LV thrombus. There is significant spontaneous echo contrast in LV c/w sluggish blood flow. The right ventricular systolic pressure was increased consistent with moderate pulmonary hypertension. 1/28 TEE >>>  DISCUSSION: This is a 67 year old with a cardiomyopathy who presents with extreme fatigue.  He is found to be in atrial fibrillation with a rapid ventricular response.  He reports a recent history of watery diarrhea and has had a bump in his creatinine and developed a non-gap acidosis with an elevated potassium.  He also has a significantly elevated lactic acid.  If not for his leukocytosis, we would attribute his constellation of findings entirely to a low cardiac output state related to dehydration from his diarrhea and atrial fibrillation with a rapid ventricular response. He was on pressors for presumed cardiogenic shock, but eventually weaned off. His course then became complicated by AF RVR requiring cardioversion 1/28. Further complicated bu recurrent hypotension/shock.   ASSESSMENT / PLAN:  Shock: likely cardiogenic, but primary service questioning if there is a septic component.   Acute on Chronic Systolic Heart Failure LVEF 15% in patient who had stopped taking his meds LV thombus Atrial Fibrillation: s/p DCCV 1/28 -Tele monitoring -Map Goal > -Aggressive dialysis per HF team.  -Milrinone infusion -Norepinephrine infusion ( ) -Will attempt non-invasive hemodynamic monitor device to assess cardiac output.   Strep Pneumoniae bacteremia  Parainfluenza virus 3 RLL infiltrate, likely PNA. Bacterial vs Viral.  Not resolved on CXR 1/28 after 7 days treatment. -Continue CTX for possible CAP. -Trend WBC/ Fever Curve -Re-culture as is clinically indicated -Will hold off on broadening ABX for now.  Acute Respiratory Failure 2/2 Acute on Chronic HF/ ? Infiltrates per CXR Pulmonary Edema -Titrate oxygen for sats > 92% -Diuresis per primary -See ID  Acute renal failure 2/2 decompensated HF, A fib / Poor renal  perfusion Metabolic acidosis. Improved Hyperkalemia improved Hypokalemia: replete  Hyponatremia:  -Tolvaptain -Strict I&O -Fluid restriction -Replace electrolytes as indicated -Poor chronic HD candidate per renal   Joneen Roach, AGACNP-BC [place] [facility]/Critical Care Pager [phone] or [phone]  10/06/2017 2:47 PM  Attending Note:  67 year old male with EF of 10-15% that is hypotensive now and there is a concern for parainfluenza virus 3 positive as cause of hypotension or if there is evolving septic shock.  On exam, patient is alert and interactive.  Moving all ext to command with bibasilar crackles.  Discussed with PCCM-NP.  I reviewed CXR myself, RLL infiltrate noted.  Will continue rocephin for now.  Recheck procalcitonin.  Reculture blood.  Hold off vanc for now since MRSA screen is negative.  Place flowtrack and evaluate cause of shock including cardiac output and SVR.  Levophed for BP support.  Pending flowtrack results will determine lasix use.  PCCM will continue to follow.  The patient is critically ill with multiple  organ systems failure and requires high complexity decision making for assessment and support, frequent evaluation and titration of therapies, application of advanced monitoring technologies and extensive interpretation of multiple databases.   Critical Care Time devoted to patient care services described in this note is  35  Minutes. This time reflects time of care of this signee Dr [name]. This critical care time does not reflect procedure time, or teaching time or supervisory time of PA/NP/Med student/Med Resident etc but could involve care discussion time.  Alyson Reedy, M.D. [facility] Pulmonary/Critical Care Medicine. Pager: [phone]. After hours pager: [phone].

## 2017-10-07 NOTE — Progress Notes (Signed)
  Assessment/Plan:67 year old white male with cardiomyopathy on no medications due to noncompliance-he now presents with A. fib and RVR in the setting of probable pneumonia with decompensated heart failure and acute kidney injury 1.Renal-normal creatinine 7 months ago. Now with acute kidney injury, suspect is secondary to a hemodynamic cause-prolonged A. fib with decreased renal perfusion.  renal ultrasound OK, U/A neg-  2. Hypertension/volume-he is volume overloaded. Decompensated heart failure  3. Cardiogenic shock 4. Hypervolemic hyponatremia 5. Low K, supp  Plan: Increase diuretics to Q 6hrs  Subjective: Interval History: Failed DCCV today  Objective: Vital signs in last 24 hours: Temp:  [97.8 F (36.6 C)-99.4 F (37.4 C)] 99 F (37.2 C) (01/28 1000) Pulse Rate:  [48-129] 76 (01/28 0800) Resp:  [11-13] 11 (01/28 1000) BP: (86-109)/(63-82) 104/66 (01/28 1000) SpO2:  [89 %-100 %] 94 % (01/28 1000) FiO2 (%):  [28 %] 28 % (01/27 1800) Weight:  [83.1 kg (183 lb 3.2 oz)] 83.1 kg (183 lb 3.2 oz) (01/28 0455) Weight change: 0.1 kg (3.5 oz)  Intake/Output from previous day: 01/27 0701 - 01/28 0700 In: 2976 [P.O.:720; I.V.:1932; IV Piggyback:324] Out: 4800 [Urine:4800] Intake/Output this shift: Total I/O In: 483.8 [I.V.:371.8; IV Piggyback:112] Out: 700 [Urine:700]  General appearance: alert and mild distress Cardio: irregularly irregular rhythm Extremities: edema 1-2+ pretib  Lungs clear but decreased  Lab Results: Recent Labs    10/05/17 0430 10/06/17 0511  WBC 9.0 6.0  HGB 12.1* 11.1*  HCT 36.7* 32.9*  PLT 319 220   BMET:  Recent Labs    10/06/17 1726 10/02/2017 0448  NA 123* 125*  K 4.0 2.9*  CL 77* 77*  CO2 31 32  GLUCOSE 118* 108*  BUN 56* 51*  CREATININE 2.13* 2.20*  CALCIUM 7.9* 7.9*   No results for input(s): PTH in the last 72 hours. Iron Studies: No results for input(s): IRON, TIBC, TRANSFERRIN, FERRITIN in the last 72  hours. Studies/Results: No results found.  Scheduled: . Chlorhexidine Gluconate Cloth  6 each Topical Daily  . feeding supplement (ENSURE ENLIVE)  237 mL Oral BID BM  . mouth rinse  15 mL Mouth Rinse BID  . potassium chloride  40 mEq Oral Once  . potassium chloride  40 mEq Oral Once  . sodium chloride flush  10-40 mL Intracatheter Q12H  . sodium chloride flush  3 mL Intravenous Q12H  . sodium chloride flush  3 mL Intravenous Q12H  . sodium chloride flush  3 mL Intravenous Q12H  . tolvaptan  15 mg Oral Once   Continuous: . sodium chloride 250 mL (10/06/17 2000)  . sodium chloride    . sodium chloride 250 mL (09/18/2017 0922)  . amiodarone 60 mg/hr (10/06/2017 1006)  . cefTRIAXone (ROCEPHIN)  IV Stopped (09/16/2017 0553)  . furosemide Stopped (10/08/2017 1020)  . heparin 2,100 Units/hr (09/28/2017 7673)  . milrinone 0.375 mcg/kg/min (10/08/2017 0219)  . norepinephrine (LEVOPHED) Adult infusion 10 mcg/min (09/14/2017 1132)  . potassium chloride 10 mEq (10/06/2017 1121)     LOS: 7 days   Lauris Poag 09/11/2017,11:46 AM

## 2017-10-07 NOTE — Progress Notes (Signed)
ANTICOAGULATION CONSULT NOTE   Pharmacy Consult for Heparin  Indication: atrial fibrillation  No Known Allergies  Vital Signs: Temp: 99.4 F (37.4 C) (01/28 0400) Temp Source: Oral (01/28 0400) BP: 86/68 (01/28 0500) Pulse Rate: 50 (01/28 0500)  Labs: Recent Labs    10/05/17 0430  10/05/17 1600 10/06/17 0511 10/06/17 1503 10/06/17 1726 10-15-2017 0448  HGB 12.1*  --   --  11.1*  --   --   --   HCT 36.7*  --   --  32.9*  --   --   --   PLT 319  --   --  220  --   --   --   HEPARINUNFRC 0.27*   < >  --  0.22* 0.33  --  0.18*  CREATININE 2.22*  --  2.24* 2.28*  --  2.13*  --    < > = values in this interval not displayed.    Estimated Creatinine Clearance: 35.7 mL/min (A) (by C-G formula based on SCr of 2.13 mg/dL (H)).  Assessment: 66yom with LV apical thrombus on IV heparin. Heparin level SUBtherapeutic, no issues with infusion per RN   Goal of Therapy:  Heparin level 0.3-0.7 units/ml Monitor platelets by anticoagulation protocol: Yes    Plan:  Increase heparin to 2100 units/hr Check confirmatory HL Daily heparin level and CBC    Jimel Myler, Darl Householder  2017/10/15 6:00 AM

## 2017-10-07 NOTE — Progress Notes (Deleted)
Spoke with Bryan Wilber Fini, MD in regards to patients tachycardia. One time lopressor was given, with temporary effect. Patient denies and pain, chest discomfort, and does not appear to be short of breath. Was advised to continue to monitor for now.

## 2017-10-07 NOTE — Anesthesia Preprocedure Evaluation (Addendum)
Anesthesia Evaluation  Patient identified by MRN, date of birth, ID band Patient awake    Reviewed: Allergy & Precautions, NPO status , Patient's Chart, lab work & pertinent test results  Airway Mallampati: II  TM Distance: >3 FB     Dental   Pulmonary former smoker,    + rhonchi        Cardiovascular hypertension, +CHF (EF 15-20%. LV apical thrombus)   Rhythm:Irregular Rate:Tachycardia     Neuro/Psych negative neurological ROS     GI/Hepatic negative GI ROS, Neg liver ROS,   Endo/Other  negative endocrine ROS  Renal/GU ARF and CRFRenal disease     Musculoskeletal   Abdominal   Peds  Hematology  (+) anemia ,   Anesthesia Other Findings   Reproductive/Obstetrics                             Lab Results  Component Value Date   WBC 6.0 10/06/2017   HGB 11.1 (L) 10/06/2017   HCT 32.9 (L) 10/06/2017   MCV 93.5 10/06/2017   PLT 220 10/06/2017   Lab Results  Component Value Date   CREATININE 2.20 (H) 09/23/2017   BUN 51 (H) 09/21/2017   NA 125 (L) 09/28/2017   K 2.9 (L) 10/03/2017   CL 77 (L) 09/13/2017   CO2 32 10/10/2017    Anesthesia Physical  Anesthesia Plan  ASA: IV  Anesthesia Plan: General   Post-op Pain Management:    Induction: Intravenous  PONV Risk Score and Plan: 2 and Ondansetron, Propofol infusion and Treatment may vary due to age or medical condition  Airway Management Planned: Mask, Natural Airway and Simple Face Mask  Additional Equipment:   Intra-op Plan:   Post-operative Plan:   Informed Consent: I have reviewed the patients History and Physical, chart, labs and discussed the procedure including the risks, benefits and alternatives for the proposed anesthesia with the patient or authorized representative who has indicated his/her understanding and acceptance.     Plan Discussed with: CRNA  Anesthesia Plan Comments:         Anesthesia Quick  Evaluation  

## 2017-10-07 NOTE — Progress Notes (Signed)
Patient ID: Bryan Harmon, male   DOB: 16-Mar-1951, 67 y.o.   MRN: [id number]     Advanced Heart Failure Rounding Note  PCP:  Primary Cardiologist: Bryan Harmon   Subjective:    Coox 69.2% on Milrinone 0.375 mcg/kg/min and 5 norepi. CVP 9-10 today. Creatinine 2.2 this am.   Back on O2 this am. Remains in Afib 110s at rest, up to 130-140s with exertion.  Last SBP in 100s.  Respiratory virus screen: Parainfluenza virus.  Bld CX- Strep pneumoniae--> IV rocephin.  CXR with RLL PNA.     Echo: Severe LV dilation, EF 15-20%, severely dilated RV with severely decreased RV systolic function, moderate-severe MR, moderate-severe TR, cannot rule out LV apical thrombus.  Repeat echo with Definity showed definite LV apical thrombus.   Objective:   Weight Range: 183 lb 3.2 oz (83.1 kg) Body mass index is 26.29 kg/m.   Vital Signs:   Temp:  [97.9 F (36.6 C)-99.4 F (37.4 C)] 99.4 F (37.4 C) (01/28 0400) Pulse Rate:  [48-129] 48 (01/28 0600) Resp:  [13-22] 13 (01/27 1300) BP: (85-112)/(63-82) 96/77 (01/28 0600) SpO2:  [89 %-100 %] 94 % (01/28 0600) FiO2 (%):  [28 %] 28 % (01/27 1800) Weight:  [183 lb 3.2 oz (83.1 kg)] 183 lb 3.2 oz (83.1 kg) (01/28 0455) Last BM Date: 10/03/17  Weight change: Filed Weights   10/05/17 0438 10/06/17 0500 09/23/2017 0455  Weight: 182 lb 12.2 oz (82.9 kg) 182 lb 15.7 oz (83 kg) 183 lb 3.2 oz (83.1 kg)    Intake/Output:   Intake/Output Summary (Last 24 hours) at 09/18/2017 0751 Last data filed at 10/08/2017 0600 Gross per 24 hour  Intake 2624 ml  Output 4200 ml  Net -1576 ml      Physical Exam  CVP 9-10  General: ill appearing. 02 face mask.  Neck: Supple. JVP ~9-10 cm. Carotids 2+ bilat; no bruits. No thyromegaly or nodule noted. Cor: PMI lateral. Irregular, tachy, 1/6 HSM LLSB. 1+ ankle edema. Lungs: CTAB, normal effort. Abdomen: Soft, non-tender, non-distended, no HSM. No bruits or masses. +BS  Extremities: No cyanosis, clubbing, or rash. R  and LLE no edema.  Neuro: Alert & orientedx3, cranial nerves grossly intact. moves all 4 extremities w/o difficulty. Affect pleasant   Telemetry   Afib 110s, personally reviewed.   EKG    No new tracings.    Labs    CBC Recent Labs    10/05/17 0430 10/06/17 0511  WBC 9.0 6.0  NEUTROABS 8.3* 5.2  HGB 12.1* 11.1*  HCT 36.7* 32.9*  MCV 94.6 93.5  PLT 319 220   Basic Metabolic Panel Recent Labs    40/98/11 1407  10/06/17 1726 09/11/2017 0448  NA 126*   < > 123* 125*  K 4.8   < > 4.0 2.9*  CL 84*   < > 77* 77*  CO2 29   < > 31 32  GLUCOSE 176*   < > 118* 108*  BUN 67*   < > 56* 51*  CREATININE 2.26*   < > 2.13* 2.20*  CALCIUM 8.2*   < > 7.9* 7.9*  PHOS 3.9  --   --  3.4   < > = values in this interval not displayed.   Liver Function Tests Recent Labs    10/05/17 0430 10/06/17 0511 09/11/2017 0448  AST 360* 214*  --   ALT 680* 484*  --   ALKPHOS 154* 134*  --   BILITOT 1.3* 1.2  --  PROT 5.8* 5.4*  --   ALBUMIN 2.6* 2.3* 2.3*   No results for input(s): LIPASE, AMYLASE in the last 72 hours. Cardiac Enzymes No results for input(s): CKTOTAL, CKMB, CKMBINDEX, TROPONINI in the last 72 hours.  BNP: BNP (last 3 results) Recent Labs    12/13/16 0949 02/11/17 1049 10/09/2017 1218  BNP 369.2* 562.7* 2,485.9*    ProBNP (last 3 results) No results for input(s): PROBNP in the last 8760 hours.   D-Dimer No results for input(s): DDIMER in the last 72 hours. Hemoglobin A1C No results for input(s): HGBA1C in the last 72 hours. Fasting Lipid Panel No results for input(s): CHOL, HDL, LDLCALC, TRIG, CHOLHDL, LDLDIRECT in the last 72 hours. Thyroid Function Tests No results for input(s): TSH, T4TOTAL, T3FREE, THYROIDAB in the last 72 hours.  Invalid input(s): FREET3  Other results:   Imaging    No results found.   Medications:     Scheduled Medications: . Chlorhexidine Gluconate Cloth  6 each Topical Daily  . feeding supplement (ENSURE ENLIVE)  237 mL  Oral BID BM  . mouth rinse  15 mL Mouth Rinse BID  . potassium chloride  40 mEq Oral Once  . potassium chloride  40 mEq Oral Once  . sodium chloride flush  10-40 mL Intracatheter Q12H  . sodium chloride flush  3 mL Intravenous Q12H  . sodium chloride flush  3 mL Intravenous Q12H  . sodium chloride flush  3 mL Intravenous Q12H    Infusions: . sodium chloride 250 mL (10/06/17 2000)  . sodium chloride    . sodium chloride    . amiodarone 60 mg/hr (October 29, 2017 0218)  . cefTRIAXone (ROCEPHIN)  IV Stopped (29-Oct-2017 0553)  . furosemide Stopped (10/06/17 1813)  . heparin 2,100 Units/hr (10-29-2017 1610)  . milrinone 0.375 mcg/kg/min (10-29-2017 0219)  . norepinephrine (LEVOPHED) Adult infusion 5 mcg/min (2017/10/29 0215)  . potassium chloride      PRN Medications: sodium chloride, acetaminophen, albuterol, ALPRAZolam, ondansetron (ZOFRAN) IV, sodium chloride flush, sodium chloride flush, sodium chloride flush, sodium chloride flush    Patient Profile  [name] is a 67 year old with a history of PAF S/P DC-CV 11/2016, s/p bilateral inguinal hernia repair 11/2017, PVCs, HTN, NICM, chronic systolic heart failure.   Sent from Urgent Care with A fib RVR. Acutely SOB on arrival   Assessment/Plan   1. Acute hypoxemic respiratory failure: RLL PNA, Strep pneumoniae in blood cultures. Respiratory cultures with parainfluenzae virus. Also acute/chronic systolic CHF. -Covering withceftriaxone currently, now off doxycycline.  -CVP 9-10. Continue IV diuresis at least today.  2.Acute on chronic systolic CHF: Nonischemic cardiomyopathy. 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 Etiology of his CHF is not clear =>no definite inciting event. He has a history of HTN but doubt this was the only trigger. 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 This would ideally be confirmed by cMRI, but he has not wanted an MRI (concerned  about side effects). He also has frequent PVCs, 21% total on last holter in 4/18 which is a risk for fall in EF.No family history of CMP. Cannot rule out viral myocarditis. SPEP negative. With medical management, he initially felt a lot better. However, he quit all his meds in early 2018 with recurrence of NYHA III symptoms and onset of atrial fibrillation.He had TEE-DCCV in 3/18.TEE showed that EF remained25%. He quit his meds again in 4/18 and apparently did not restart them when I asked him to  in 6/18. No meds probably since 4/18. Echo this admission with EF 15-20%, severe RV dysfunction, LV apical thrombus.  - Remains on milrinone 0.375 + norepinephrine 5. Co-ox good 69% this am with CVP ~10.  - Continue Lasix 120 mg IV BID at least this am.  - Continue current milrinone + norepinephrine.  -ControlHR with amiodarone gtt (better around 110) but ultimately needs NSR as HR up to 130s-140s with any movement. Plan TEE-DCCV today.  - Place ted hose.   3. 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 It is possible that the PVCs contribute to his cardiomyopathy. -Onamiodarone. No change. 4. Atrial fibrillation:Persistent, now with RVR. HR up to 140sinitially, now 110s on amiodarone gtt at60. Not sure how long he has been in atrial fibrillation with marked RVR, but this may have triggered his CHF and AKI due to worsening of cardiac output.  - Need to slow HR,continueamiodarone gtt at60mg /hrfor now (HR better around 110).  -Onheparin gtt for anticoagulation with AKI.  - Plan TEE/DCCV today. However, he has an LV apical thrombus and we have not checked for LAA thrombus as he has not been stable for TEE.  Based on limited available data, DCCV can be done with LV thrombus (only data is on cohort of 21 pts from 2006).  However, have not ruled out LA appendage thrombus. Dr. Shirlee Harmon has discussed the risks and benefits with patient and family.  Risk will be higher than  usual, but think it is going to be imperative to get him back in NSR as his rate is very difficult to control.  He is willing to undertake procedure. 5. AKI: Creatinine 1.08 in 6/18, he has not been on any meds. Suspect cardiorenal syndrome with afib/RVR and fall in cardiac output.He was initially hyperkalemic and acidotic.BUN/creatinine still high (creatinine 2.2) but stable today. -Nephrology following. - Will try to maintain cardiac output and BP to avoid further hemodynamic damage.  6. SE:LTRVU pneumo PNA as well as parainfluenza virus. - Repeat BCx Negative (Final) - Continues Ceftriaxone.  - Will send repeat procalcitonin to make sure decreasing.  7. Elevated LFTs: Suspect shock liver type picture. - AST/ALT > 1000 at admission. But have trended down steadily.  8. LV thrombus:  - On heparin gtt. TEE today.  9. Hyponatremia:  - Hypervolemic hyponatremia. Received dose of tolvaptan 10/06/17. - Na remains low at 125 this am. Will discuss repeat dose this am in setting of TEE/DCCV later today.  10. Malnutrition:  - Continue Ensure. Encourge po intake post procedure.   Graciella Freer, PA-C  09/22/2017 7:51 AM   Advanced Heart Failure Team Pager [phone] (M-F; 7a - 4p)  Please contact [facility] for night-coverage after hours (4p -7a ) and weekends on [email or web address]  Patient seen with PA, agree with the above note.  He diuresed well again yesterday, CVP down to 9-10, co-ox 69%.  SBP 100s this morning with HR still in 110s, atrial fibrillation, at rest.  He denies dyspnea at rest.  On exam, JVP 9-10 cm, decreased breath sounds right base, 2+ edema to knees bilaterally, heart irreg S1S2 no murmur.   Stable this morning but tenuous.  Continue current milrinone and norepinephrine.  Will diurese with Lasix 120 mg IV bid along with a dose of tolvaptan 15 mg again today given ongoing hyponatremia and some remaining volume.  Place ted hose.   He continues on treatment for  Strep pneumo PNA with ceftriaxone, will resend procalcitonin to make sure this is  decreasing.   HR remains in 110s at rest, up to 130s with exertion.  I think that we are going to need to get him back into NSR.  Volume status has improved and I think he is stable for TEE-guided DCCV at this point.  No DCCV if he has an LA thrombus, but based on the data described above, I do not think the risk of embolization would be particularly high with the LV thrombus.  I discussed this risks/benefits extensively with patient and family, and we plan to go forward with procedure.  He will be at higher risk for respiratory decompensation and CVA.    CRITICAL CARE Performed by: Marca Ancona  Total critical care time: 35 minutes  Critical care time was exclusive of separately billable procedures and treating other patients.  Critical care was necessary to treat or prevent imminent or life-threatening deterioration.  Critical care was time spent personally by me on the following activities: development of treatment plan with patient and/or surrogate as well as nursing, discussions with consultants, evaluation of patient's response to treatment, examination of patient, obtaining history from patient or surrogate, ordering and performing treatments and interventions, ordering and review of laboratory studies, ordering and review of radiographic studies, pulse oximetry and re-evaluation of patient's condition.  Marca Ancona 09/29/2017 8:59 AM

## 2017-10-07 NOTE — Anesthesia Preprocedure Evaluation (Deleted)
Anesthesia Evaluation  Patient identified by MRN, date of birth, ID band Patient awake    Reviewed: Allergy & Precautions, NPO status , Patient's Chart, lab work & pertinent test results  Airway Mallampati: II  TM Distance: >3 FB     Dental   Pulmonary former smoker,    + rhonchi        Cardiovascular hypertension, +CHF (EF 15-20%. LV apical thrombus)   Rhythm:Irregular Rate:Tachycardia     Neuro/Psych negative neurological ROS     GI/Hepatic negative GI ROS, Neg liver ROS,   Endo/Other  negative endocrine ROS  Renal/GU ARF and CRFRenal disease     Musculoskeletal   Abdominal   Peds  Hematology  (+) anemia ,   Anesthesia Other Findings   Reproductive/Obstetrics                             Lab Results  Component Value Date   WBC 6.0 10/06/2017   HGB 11.1 (L) 10/06/2017   HCT 32.9 (L) 10/06/2017   MCV 93.5 10/06/2017   PLT 220 10/06/2017   Lab Results  Component Value Date   CREATININE 2.20 (H) 09/28/2017   BUN 51 (H) 09/12/2017   NA 125 (L) 10/04/2017   K 2.9 (L) 10/05/2017   CL 77 (L) 09/26/2017   CO2 32 10/10/2017    Anesthesia Physical  Anesthesia Plan  ASA: IV  Anesthesia Plan: General   Post-op Pain Management:    Induction: Intravenous  PONV Risk Score and Plan: 2 and Ondansetron, Propofol infusion and Treatment may vary due to age or medical condition  Airway Management Planned: Mask, Natural Airway and Simple Face Mask  Additional Equipment:   Intra-op Plan:   Post-operative Plan:   Informed Consent: I have reviewed the patients History and Physical, chart, labs and discussed the procedure including the risks, benefits and alternatives for the proposed anesthesia with the patient or authorized representative who has indicated his/her understanding and acceptance.     Plan Discussed with: CRNA  Anesthesia Plan Comments:         Anesthesia Quick  Evaluation

## 2017-10-07 NOTE — Transfer of Care (Signed)
Immediate Anesthesia Transfer of Care Note  Patient: Bryan Harmon  Procedure(s) Performed: TRANSESOPHAGEAL ECHOCARDIOGRAM (TEE) (N/A ) CARDIOVERSION (N/A )  Patient Location: ICU  Anesthesia Type:MAC and General  Level of Consciousness: awake and alert   Airway & Oxygen Therapy: Patient Spontanous Breathing and Patient connected to nasal cannula oxygen  Post-op Assessment: Report given to RN and Post -op Vital signs reviewed and stable  Post vital signs: Reviewed and stable  Last Vitals:  Vitals:   10/04/2017 0800 09/27/2017 1000  BP: 101/77 104/66  Pulse: 76   Resp:  11  Temp: 36.6 C 37.2 C  SpO2: 94% 94%    Last Pain:  Vitals:   10/04/2017 1000  TempSrc: Oral  PainSc:       Patients Stated Pain Goal: 0 (47/09/29 5747)  Complications: No apparent anesthesia complications

## 2017-10-07 NOTE — Progress Notes (Addendum)
ANTICOAGULATION CONSULT NOTE   Pharmacy Consult for Heparin  Indication: atrial fibrillation + LV apical thrombus  No Known Allergies  Vital Signs: Temp: 98.1 F (36.7 C) (01/28 1100) Temp Source: Oral (01/28 1100) BP: 107/92 (01/28 1200) Pulse Rate: 54 (01/28 1200)  Labs: Recent Labs    10/05/17 0430  10/06/17 0511 10/06/17 1503 10/06/17 1726 10-12-2017 0448 10/12/2017 1111  HGB 12.1*  --  11.1*  --   --   --  11.0*  HCT 36.7*  --  32.9*  --   --   --  32.7*  PLT 319  --  220  --   --   --  198  HEPARINUNFRC 0.27*   < > 0.22* 0.33  --  0.18* 0.39  CREATININE 2.22*   < > 2.28*  --  2.13* 2.20*  --    < > = values in this interval not displayed.    Estimated Creatinine Clearance: 34.6 mL/min (A) (by C-G formula based on SCr of 2.2 mg/dL (H)).  Assessment: 66yom with LV apical thrombus on IV heparin. ECHO showing again small LV apical thrombus but no LA appendage thrombus. Underwent DCCV this morning.   Heparin level is therapeutic at 0.39, on 2100 units/hr, approximately 5 hours after rate increase. Hgb is stable, platelet trended down slightly to 198. No issues with infusion per RN.   Goal of Therapy:  Heparin level 0.3-0.7 units/ml Monitor platelets by anticoagulation protocol: Yes   Plan:  Continue heparin at 2100 units/hr Check confirmatory HL Daily heparin level and CBC  Girard Cooter, PharmD Clinical Pharmacist  Pager: [phone] Clinical Phone for 10/12/2017 until 3:30pm: [phone] If after 3:30pm, please call main pharmacy at [phone]  10-12-2017 12:38 PM

## 2017-10-07 NOTE — Progress Notes (Signed)
ANTICOAGULATION CONSULT NOTE   Pharmacy Consult for Heparin  Indication: atrial fibrillation + LV apical thrombus  No Known Allergies  Vital Signs: Temp: 98.5 F (36.9 C) (01/28 1608) Temp Source: Oral (01/28 1608) BP: 70/56 (01/28 1845) Pulse Rate: 118 (01/28 1815)  Labs: Recent Labs    10/05/17 0430  10/06/17 0511  10/06/17 1726 09/24/2017 0448 09/24/2017 1111 09/10/2017 1524 09/17/2017 1830  HGB 12.1*  --  11.1*  --   --   --  11.0*  --   --   HCT 36.7*  --  32.9*  --   --   --  32.7*  --   --   PLT 319  --  220  --   --   --  198  --   --   HEPARINUNFRC 0.27*   < > 0.22*   < >  --  0.18* 0.39  --  0.35  CREATININE 2.22*   < > 2.28*  --  2.13* 2.20*  --  2.37*  --    < > = values in this interval not displayed.    Estimated Creatinine Clearance: 32.1 mL/min (A) (by C-G formula based on SCr of 2.37 mg/dL (H)).  Assessment: 66yom with LV apical thrombus on IV heparin. ECHO showing again small LV apical thrombus but no LA appendage thrombus. Underwent DCCV this morning but went back into Afib.   Heparin level is therapeutic at 0.35 on 2100 units/hr. No bleeding noted.  Goal of Therapy:  Heparin level 0.3-0.7 units/ml Monitor platelets by anticoagulation protocol: Yes   Plan:  Continue heparin drip at 2100 units/hr Daily heparin level and CBC Monitor for s/sx of bleeding   Loura Back, PharmD, BCPS Clinical Pharmacist Phone for today [phone] Main pharmacy - [phone] 09/24/2017 7:27 PM

## 2017-10-07 NOTE — CV Procedure (Signed)
Procedure: TEE  Sedation: Per anesthesiology  Indication: Atrial fibrillation, CHF  Findings: Please see echo section for full report.  Mildly dilated left atrium with diffuse severe hypokinesis, EF 10-15%.  Small LV apical thrombus again visualized.  Mildly dilated RV with moderately decreased systolic function.  Moderate LAE.  There is smoke in the LA but no LA appendage thrombus.  Mildly dilated RA, Chiari network noted.  No PFO/ASD by color dopplers. Mild to moderate MR with thickened mitral leaflets near the tips.  Mild to moderate TR.  Trileaflet aortic valve with trivial AI, no AS.  Normal caliber aorta with mild plaque noted.   Proceed to DCCV.  Bryan Harmon 09/29/2017 10:42 AM

## 2017-10-07 NOTE — Progress Notes (Signed)
Patient ID: HUYNH SHENOY, male   DOB: [birth year], 67 y.o.   MRN: [id number]  Patient had TEE-guided DCCV today, only remained in NSR for a short period then back to atrial fibrillation.  Current rate in 100s-110s.  Co-ox 69% this morning.  BUN/creatinine remaining fairly stable.   He remains on milrinone + norepinephrine.    I am going to take him to the cath lab for [facility] placement tomorrow.  I think that he is going to need mechanical support.  IABP probably not a good idea here with periods of tachycardia.  Discussed with Dr. Donata Clay, will reassess based on Swan numbers tomorrow but tentatively plan Impella 5.0 in OR Wednesday morning.  This hopefully will allow [place] to wean milrinone/norepinephrine and re-attempt cardioversion to see if NSR will allow weaning of support.  If unable to wean Impella and renal function stabilizes, LVAD will be an option.  Will order noncontrast CT chest for planning for Impella.   Marca Ancona 09/24/2017 6:20 PM

## 2017-10-07 NOTE — Anesthesia Postprocedure Evaluation (Signed)
Anesthesia Post Note  Patient: Bryan Harmon  Procedure(s) Performed: TRANSESOPHAGEAL ECHOCARDIOGRAM (TEE) (N/A ) CARDIOVERSION (N/A )     Patient location during evaluation: PACU Anesthesia Type: General Level of consciousness: awake and alert Pain management: pain level controlled Vital Signs Assessment: post-procedure vital signs reviewed and stable Respiratory status: spontaneous breathing, nonlabored ventilation, respiratory function stable and patient connected to nasal cannula oxygen Cardiovascular status: blood pressure returned to baseline and stable Postop Assessment: no apparent nausea or vomiting Anesthetic complications: no    Last Vitals:  Vitals:   09/16/2017 1200 10/01/2017 1300  BP: (!) 107/92 99/65  Pulse: (!) 54 (!) 155  Resp: (!) 26   Temp:    SpO2: 95% 99%    Last Pain:  Vitals:   10/05/2017 1100  TempSrc: Oral  PainSc:                  [name]

## 2017-10-07 NOTE — Procedures (Signed)
Electrical Cardioversion Procedure Note Bryan BLOSSER [id number] [birth year]  Procedure: Electrical Cardioversion Indications:  Atrial Fibrillation  Procedure Details Consent: Risks of procedure as well as the alternatives and risks of each were explained to the (patient/caregiver).  Consent for procedure obtained. Time Out: Verified patient identification, verified procedure, site/side was marked, verified correct patient position, special equipment/implants available, medications/allergies/relevent history reviewed, required imaging and test results available.  Performed  Patient placed on cardiac monitor, pulse oximetry, supplemental oxygen as necessary.  Sedation given: Per anesthesiology Pacer pads placed anterior and posterior chest.  Cardioverted 1 time(s).  Cardioverted at 200J.  Evaluation Findings: Post procedure EKG shows: NSR.  There were very frequent runs of atrial fibrillation interspersed with sinus beats.  Complications: None Patient did tolerate procedure well.  I suspect that he will not hold NSR after this DCCV.  Will work on weaning inotropes/pressors now that he is better diuresed and re-attempt DCCV goes back into continuous atrial fibrillation.    Marca Ancona 09/18/2017, 10:44 AM

## 2017-10-08 ENCOUNTER — Inpatient Hospital Stay (HOSPITAL_COMMUNITY): Admission: EM | Disposition: E | Payer: Self-pay | Source: Home / Self Care | Attending: Cardiology

## 2017-10-08 ENCOUNTER — Encounter (HOSPITAL_COMMUNITY): Payer: Self-pay | Admitting: Cardiology

## 2017-10-08 ENCOUNTER — Inpatient Hospital Stay (HOSPITAL_COMMUNITY): Payer: Medicare Other

## 2017-10-08 DIAGNOSIS — J181 Lobar pneumonia, unspecified organism: Secondary | ICD-10-CM

## 2017-10-08 DIAGNOSIS — R570 Cardiogenic shock: Secondary | ICD-10-CM

## 2017-10-08 DIAGNOSIS — I5023 Acute on chronic systolic (congestive) heart failure: Secondary | ICD-10-CM

## 2017-10-08 HISTORY — PX: RIGHT HEART CATH: CATH118263

## 2017-10-08 LAB — BASIC METABOLIC PANEL
ANION GAP: 17 — AB (ref 5–15)
Anion gap: 14 (ref 5–15)
BUN: 50 mg/dL — ABNORMAL HIGH (ref 6–20)
BUN: 52 mg/dL — ABNORMAL HIGH (ref 6–20)
CO2: 31 mmol/L (ref 22–32)
CO2: 29 mmol/L (ref 22–32)
Calcium: 7.8 mg/dL — ABNORMAL LOW (ref 8.9–10.3)
Calcium: 8.2 mg/dL — ABNORMAL LOW (ref 8.9–10.3)
Chloride: 80 mmol/L — ABNORMAL LOW (ref 101–111)
Chloride: 79 mmol/L — ABNORMAL LOW (ref 101–111)
Creatinine, Ser: 2.2 mg/dL — ABNORMAL HIGH (ref 0.61–1.24)
Creatinine, Ser: 2.33 mg/dL — ABNORMAL HIGH (ref 0.61–1.24)
GFR calc Af Amer: 34 mL/min — ABNORMAL LOW (ref >60)
GFR calc Af Amer: 32 mL/min — ABNORMAL LOW (ref >60)
GFR calc non Af Amer: 30 mL/min — ABNORMAL LOW (ref >60)
GFR, EST NON AFRICAN AMERICAN: 28 mL/min — AB (ref >60)
GLUCOSE: 150 mg/dL — AB (ref 65–99)
Glucose, Bld: 205 mg/dL — ABNORMAL HIGH (ref 65–99)
POTASSIUM: 3.9 mmol/L (ref 3.5–5.1)
Potassium: 3.1 mmol/L — ABNORMAL LOW (ref 3.5–5.1)
SODIUM: 125 mmol/L — AB (ref 135–145)
Sodium: 125 mmol/L — ABNORMAL LOW (ref 135–145)

## 2017-10-08 LAB — POCT I-STAT 3, VENOUS BLOOD GAS (G3P V)
Acid-Base Excess: 10 mmol/L — ABNORMAL HIGH (ref 0.0–2.0)
Acid-Base Excess: 9 mmol/L — ABNORMAL HIGH (ref 0.0–2.0)
Acid-Base Excess: 10 mmol/L — ABNORMAL HIGH (ref 0.0–2.0)
BICARBONATE: 33.7 mmol/L — AB (ref 20.0–28.0)
Bicarbonate: 34 mmol/L — ABNORMAL HIGH (ref 20.0–28.0)
Bicarbonate: 33.6 mmol/L — ABNORMAL HIGH (ref 20.0–28.0)
O2 SAT: 40 %
O2 Saturation: 36 %
O2 Saturation: 36 %
PCO2 VEN: 43.2 mmHg — AB (ref 44.0–60.0)
PCO2 VEN: 43.3 mmHg — AB (ref 44.0–60.0)
PCO2 VEN: 41.5 mmHg — AB (ref 44.0–60.0)
PH VEN: 7.5 — AB (ref 7.250–7.430)
PH VEN: 7.504 — AB (ref 7.250–7.430)
PO2 VEN: 20 mmHg — AB (ref 32.0–45.0)
PO2 VEN: 21 mmHg — AB (ref 32.0–45.0)
TCO2: 35 mmol/L — AB (ref 22–32)
TCO2: 35 mmol/L — ABNORMAL HIGH (ref 22–32)
TCO2: 35 mmol/L — AB (ref 22–32)
pH, Ven: 7.516 — ABNORMAL HIGH (ref 7.250–7.430)
pO2, Ven: 20 mmHg — CL (ref 32.0–45.0)

## 2017-10-08 LAB — CBC WITH DIFFERENTIAL/PLATELET
Band Neutrophils: 10 %
Basophils Absolute: 0 10*3/uL (ref 0.0–0.1)
Basophils Relative: 0 %
Blasts: 0 %
Eosinophils Absolute: 0 10*3/uL (ref 0.0–0.7)
Eosinophils Relative: 0 %
HCT: 35.5 % — ABNORMAL LOW (ref 39.0–52.0)
Hemoglobin: 12.2 g/dL — ABNORMAL LOW (ref 13.0–17.0)
Lymphocytes Relative: 8 %
Lymphs Abs: 0.6 10*3/uL — ABNORMAL LOW (ref 0.7–4.0)
MCH: 31.9 pg (ref 26.0–34.0)
MCHC: 34.4 g/dL (ref 30.0–36.0)
MCV: 92.7 fL (ref 78.0–100.0)
Metamyelocytes Relative: 0 %
Monocytes Absolute: 0 10*3/uL — ABNORMAL LOW (ref 0.1–1.0)
Monocytes Relative: 0 %
Myelocytes: 0 %
Neutro Abs: 6.7 10*3/uL (ref 1.7–7.7)
Neutrophils Relative %: 82 %
Other: 0 %
Platelets: 207 10*3/uL (ref 150–400)
Promyelocytes Absolute: 0 %
RBC: 3.83 MIL/uL — ABNORMAL LOW (ref 4.22–5.81)
RDW: 14.3 % (ref 11.5–15.5)
WBC: 7.3 10*3/uL (ref 4.0–10.5)
nRBC: 0 /100 WBC

## 2017-10-08 LAB — URINALYSIS, ROUTINE W REFLEX MICROSCOPIC
Bilirubin Urine: NEGATIVE
Glucose, UA: NEGATIVE mg/dL
Ketones, ur: NEGATIVE mg/dL
Nitrite: NEGATIVE
Protein, ur: 30 mg/dL — AB
Specific Gravity, Urine: 1.01 (ref 1.005–1.030)
pH: 5 (ref 5.0–8.0)

## 2017-10-08 LAB — COOXEMETRY PANEL
CARBOXYHEMOGLOBIN: 0.8 % (ref 0.5–1.5)
Carboxyhemoglobin: 1.6 % — ABNORMAL HIGH (ref 0.5–1.5)
Methemoglobin: 0.7 % (ref 0.0–1.5)
Methemoglobin: 1.1 % (ref 0.0–1.5)
O2 SAT: 47.5 %
O2 Saturation: 68.2 %
TOTAL HEMOGLOBIN: 12.4 g/dL (ref 12.0–16.0)
Total hemoglobin: 12.2 g/dL (ref 12.0–16.0)

## 2017-10-08 LAB — PROCALCITONIN: PROCALCITONIN: 4.42 ng/mL

## 2017-10-08 LAB — ABO/RH: ABO/RH(D): O POS

## 2017-10-08 LAB — HEPARIN LEVEL (UNFRACTIONATED): Heparin Unfractionated: 0.43 IU/mL (ref 0.30–0.70)

## 2017-10-08 LAB — PREPARE RBC (CROSSMATCH)

## 2017-10-08 SURGERY — RIGHT HEART CATH
Anesthesia: LOCAL

## 2017-10-08 MED ORDER — FENTANYL CITRATE (PF) 100 MCG/2ML IJ SOLN
INTRAMUSCULAR | Status: AC
  Filled 2017-10-08: qty 2

## 2017-10-08 MED ORDER — CHLORHEXIDINE GLUCONATE 0.12 % MT SOLN
15.0000 mL | Freq: Once | OROMUCOSAL | Status: AC
  Administered 2017-10-09: 15 mL via OROMUCOSAL
  Filled 2017-10-08: qty 15

## 2017-10-08 MED ORDER — EPINEPHRINE PF 1 MG/ML IJ SOLN
0.0000 ug/min | INTRAVENOUS | Status: DC
  Filled 2017-10-08: qty 4

## 2017-10-08 MED ORDER — TRANEXAMIC ACID (OHS) PUMP PRIME SOLUTION
2.0000 mg/kg | INTRAVENOUS | Status: DC
  Filled 2017-10-08: qty 1.63

## 2017-10-08 MED ORDER — MIDAZOLAM HCL 2 MG/2ML IJ SOLN
INTRAMUSCULAR | Status: AC
  Filled 2017-10-08: qty 2

## 2017-10-08 MED ORDER — POTASSIUM CHLORIDE CRYS ER 20 MEQ PO TBCR: 40.0000 meq | EXTENDED_RELEASE_TABLET | Freq: Once | ORAL | Status: DC

## 2017-10-08 MED ORDER — DOPAMINE-DEXTROSE 3.2-5 MG/ML-% IV SOLN
0.0000 ug/kg/min | INTRAVENOUS | Status: DC
  Filled 2017-10-08: qty 250

## 2017-10-08 MED ORDER — BISACODYL 5 MG PO TBEC
5.0000 mg | DELAYED_RELEASE_TABLET | Freq: Once | ORAL | Status: AC
  Administered 2017-10-08: 5 mg via ORAL
  Filled 2017-10-08: qty 1

## 2017-10-08 MED ORDER — DEXMEDETOMIDINE HCL IN NACL 400 MCG/100ML IV SOLN
0.1000 ug/kg/h | INTRAVENOUS | Status: AC
  Administered 2017-10-09: 0.7 ug/kg/h via INTRAVENOUS
  Filled 2017-10-08 (×2): qty 100

## 2017-10-08 MED ORDER — AMIODARONE HCL IN DEXTROSE 360-4.14 MG/200ML-% IV SOLN
INTRAVENOUS | Status: AC
  Filled 2017-10-08: qty 200

## 2017-10-08 MED ORDER — MAGNESIUM SULFATE 50 % IJ SOLN
40.0000 meq | INTRAMUSCULAR | Status: DC
  Filled 2017-10-08: qty 9.85

## 2017-10-08 MED ORDER — DEXTROSE 5 % IV SOLN
1.5000 g | INTRAVENOUS | Status: AC
  Administered 2017-10-09: 1.5 g via INTRAVENOUS
  Filled 2017-10-08: qty 1.5

## 2017-10-08 MED ORDER — POTASSIUM CHLORIDE CRYS ER 20 MEQ PO TBCR
40.0000 meq | EXTENDED_RELEASE_TABLET | Freq: Once | ORAL | Status: AC
  Administered 2017-10-08: 40 meq via ORAL
  Filled 2017-10-08: qty 2

## 2017-10-08 MED ORDER — TRANEXAMIC ACID 1000 MG/10ML IV SOLN
1.5000 mg/kg/h | INTRAVENOUS | Status: DC
  Filled 2017-10-08: qty 25

## 2017-10-08 MED ORDER — CHLORHEXIDINE GLUCONATE CLOTH 2 % EX PADS
6.0000 | MEDICATED_PAD | Freq: Once | CUTANEOUS | Status: AC
  Administered 2017-10-09: 6 via TOPICAL

## 2017-10-08 MED ORDER — HEPARIN (PORCINE) IN NACL 100-0.45 UNIT/ML-% IJ SOLN
2100.0000 [IU]/h | INTRAMUSCULAR | Status: DC
  Administered 2017-10-08: 2100 [IU]/h via INTRAVENOUS
  Filled 2017-10-08: qty 250

## 2017-10-08 MED ORDER — TOLVAPTAN 15 MG PO TABS
30.0000 mg | ORAL_TABLET | Freq: Once | ORAL | Status: AC
  Administered 2017-10-08: 30 mg via ORAL
  Filled 2017-10-08: qty 2

## 2017-10-08 MED ORDER — MIDAZOLAM HCL 2 MG/2ML IJ SOLN
INTRAMUSCULAR | Status: DC | PRN
  Administered 2017-10-08: 0.5 mg via INTRAVENOUS

## 2017-10-08 MED ORDER — POTASSIUM CHLORIDE 2 MEQ/ML IV SOLN
80.0000 meq | INTRAVENOUS | Status: DC
Start: 2017-10-09 — End: 2017-10-09
  Filled 2017-10-08: qty 40

## 2017-10-08 MED ORDER — FENTANYL CITRATE (PF) 100 MCG/2ML IJ SOLN
INTRAMUSCULAR | Status: DC | PRN
  Administered 2017-10-08: 12.5 ug via INTRAVENOUS

## 2017-10-08 MED ORDER — NITROGLYCERIN IN D5W 200-5 MCG/ML-% IV SOLN
2.0000 ug/min | INTRAVENOUS | Status: DC
  Filled 2017-10-08: qty 250

## 2017-10-08 MED ORDER — LIDOCAINE HCL (PF) 1 % IJ SOLN
INTRAMUSCULAR | Status: DC | PRN
  Administered 2017-10-08: 5 mL

## 2017-10-08 MED ORDER — POTASSIUM CHLORIDE 10 MEQ/50ML IV SOLN
10.0000 meq | INTRAVENOUS | Status: AC
  Administered 2017-10-08 (×3): 10 meq via INTRAVENOUS
  Filled 2017-10-08: qty 50

## 2017-10-08 MED ORDER — SODIUM CHLORIDE 0.9 % IV SOLN
INTRAVENOUS | Status: DC
  Filled 2017-10-08: qty 1

## 2017-10-08 MED ORDER — LIDOCAINE HCL 1 % IJ SOLN
INTRAMUSCULAR | Status: AC
  Filled 2017-10-08: qty 20

## 2017-10-08 MED ORDER — TRANEXAMIC ACID (OHS) BOLUS VIA INFUSION
15.0000 mg/kg | INTRAVENOUS | Status: DC
Start: 2017-10-09 — End: 2017-10-09
  Filled 2017-10-08: qty 1221

## 2017-10-08 MED ORDER — DEXTROSE 5 % IV SOLN
750.0000 mg | INTRAVENOUS | Status: DC
  Filled 2017-10-08: qty 750

## 2017-10-08 MED ORDER — FUROSEMIDE 10 MG/ML IJ SOLN
120.0000 mg | Freq: Three times a day (TID) | INTRAVENOUS | Status: DC
  Administered 2017-10-08 – 2017-10-10 (×6): 120 mg via INTRAVENOUS
  Filled 2017-10-08: qty 2
  Filled 2017-10-08 (×2): qty 12
  Filled 2017-10-08: qty 10
  Filled 2017-10-08 (×2): qty 12
  Filled 2017-10-08: qty 10

## 2017-10-08 MED ORDER — TEMAZEPAM 15 MG PO CAPS
15.0000 mg | ORAL_CAPSULE | Freq: Once | ORAL | Status: AC | PRN
  Administered 2017-10-08: 15 mg via ORAL
  Filled 2017-10-08: qty 1

## 2017-10-08 MED ORDER — SODIUM CHLORIDE 0.9 % IV SOLN
30.0000 ug/min | INTRAVENOUS | Status: DC
  Filled 2017-10-08: qty 2

## 2017-10-08 MED ORDER — SODIUM CHLORIDE 0.9 % IV SOLN
1250.0000 mg | INTRAVENOUS | Status: AC
  Administered 2017-10-09: 1250 mg via INTRAVENOUS
  Filled 2017-10-08: qty 1250

## 2017-10-08 MED ORDER — HEPARIN SODIUM (PORCINE) 1000 UNIT/ML IJ SOLN
INTRAMUSCULAR | Status: DC
  Filled 2017-10-08: qty 30

## 2017-10-08 MED ORDER — CHLORHEXIDINE GLUCONATE CLOTH 2 % EX PADS
6.0000 | MEDICATED_PAD | Freq: Once | CUTANEOUS | Status: AC
  Administered 2017-10-08: 6 via TOPICAL

## 2017-10-08 MED ORDER — HEPARIN (PORCINE) IN NACL 2-0.9 UNIT/ML-% IJ SOLN
INTRAMUSCULAR | Status: AC
  Filled 2017-10-08: qty 500

## 2017-10-08 MED ORDER — PAPAVERINE HCL 30 MG/ML IJ SOLN
INTRAMUSCULAR | Status: DC
  Filled 2017-10-08: qty 2.5

## 2017-10-08 SURGICAL SUPPLY — 16 items
BAG SNAP BAND KOVER 36X36 (MISCELLANEOUS) ×1 IMPLANT
CATH SWAN GANZ VIP 7.5F (CATHETERS) ×1 IMPLANT
COVER DOME SNAP 22 D (MISCELLANEOUS) ×1 IMPLANT
COVER PRB 48X5XTLSCP FOLD TPE (BAG) IMPLANT
COVER PROBE 5X48 (BAG) ×2
KIT HEART LEFT (KITS) ×2 IMPLANT
PACK CARDIAC CATHETERIZATION (CUSTOM PROCEDURE TRAY) ×2 IMPLANT
PROTECTION STATION PRESSURIZED (MISCELLANEOUS) ×2
SHEATH PINNACLE 8F 10CM (SHEATH) ×1 IMPLANT
SLEEVE REPOSITIONING LENGTH 30 (MISCELLANEOUS) ×1 IMPLANT
STATION PROTECTION PRESSURIZED (MISCELLANEOUS) IMPLANT
SYR MEDRAD MARK V 150ML (SYRINGE) ×2 IMPLANT
TRANSDUCER W/STOPCOCK (MISCELLANEOUS) ×3 IMPLANT
TUBING ART PRESS 72  MALE/FEM (TUBING) ×1
TUBING ART PRESS 72 MALE/FEM (TUBING) IMPLANT
TUBING CIL FLEX 10 FLL-RA (TUBING) ×2 IMPLANT

## 2017-10-08 NOTE — Interval H&P Note (Signed)
History and Physical Interval Note:  10/05/2017 12:54 PM  Bryan Harmon  has presented today for surgery, with the diagnosis of hf  The various methods of treatment have been discussed with the patient and family. After consideration of risks, benefits and other options for treatment, the patient has consented to  Procedure(s): RIGHT HEART CATH (N/A) as a surgical intervention .  The patient's history has been reviewed, patient examined, no change in status, stable for surgery.  I have reviewed the patient's chart and labs.  Questions were answered to the patient's satisfaction.     [name] [employer]

## 2017-10-08 NOTE — Progress Notes (Signed)
Paged MD [name] concerning patients Coox and respiratory status. Orders given. RN will continue to monitor patient.

## 2017-10-08 NOTE — Progress Notes (Addendum)
Patient ID: Bryan Harmon, male   DOB: [birth year], 67 y.o.   MRN: [id number]     Advanced Heart Failure Rounding Note  PCP:  Primary Cardiologist: Bryan Harmon   Subjective:    Coox 68% on Milrinone 0.375 mcg/kg/min and 6 norepi. CVP 14 today. Creatinine 2.2 this am, stable.   Remains in Afib 110s at rest.  Last SBP in 100s.  DCCV yesterday, in NSR with intermittent afib then back to afib.  Respiratory virus screen: Parainfluenza virus.  Bld CX- Strep pneumoniae--> IV rocephin.  CXR with persistent RLL PNA.  Seen by CCM yesterday, did not recommend broadening abx.  WBCs not elevated.   Echo: Severe LV dilation, EF 15-20%, severely dilated RV with severely decreased RV systolic function, moderate-severe MR, moderate-severe TR, cannot rule out LV apical thrombus.  Repeat echo with Definity showed definite LV apical thrombus.   TEE (1/28): EF 10-15%, diffuse hypokinesis, small LV apical thrombus, mildly dilated RV with moderately decreased systolic function, no LAA thrombus.    Objective:   Weight Range: 179 lb 7.3 oz (81.4 kg) Body mass index is 25.75 kg/m.   Vital Signs:   Temp:  [97.8 F (36.6 C)-99 F (37.2 C)] 98.3 F (36.8 C) (01/29 0400) Pulse Rate:  [26-155] 118 (01/29 0700) Resp:  [11-30] 28 (01/29 0700) BP: (70-117)/(44-92) 106/85 (01/29 0700) SpO2:  [87 %-100 %] 97 % (01/29 0700) Weight:  [179 lb 7.3 oz (81.4 kg)] 179 lb 7.3 oz (81.4 kg) (01/29 0500) Last BM Date: 10/03/17  Weight change: Filed Weights   10/06/17 0500 09/22/2017 0455 09/26/2017 0500  Weight: 182 lb 15.7 oz (83 kg) 183 lb 3.2 oz (83.1 kg) 179 lb 7.3 oz (81.4 kg)    Intake/Output:   Intake/Output Summary (Last 24 hours) at 09/10/2017 0746 Last data filed at 10/01/2017 0700 Gross per 24 hour  Intake 4314.83 ml  Output 3850 ml  Net 464.83 ml      Physical Exam  CVP 14  General: chronically ill-appearing Neck: JVP 14 cm, no thyromegaly or thyroid nodule.  Lungs: Decreased breath sounds right  base.  CV: Lateral PMI.  Heart tachy, irregular S1/S2, no S3/S4, no murmur.  2+ edema to knees bilaterally.   Abdomen: Soft, nontender, no hepatosplenomegaly, no distention.  Skin: Intact without lesions or rashes.  Neurologic: Alert and oriented x 3.  Psych: Normal affect. Extremities: No clubbing or cyanosis.  HEENT: Normal.    Telemetry   Afib 110s, personally reviewed.   EKG    No new tracings.    Labs    CBC Recent Labs    10/06/17 0511 09/10/2017 1111 09/18/2017 0411  WBC 6.0 6.4 7.3  NEUTROABS 5.2  --  6.7  HGB 11.1* 11.0* 12.2*  HCT 32.9* 32.7* 35.5*  MCV 93.5 93.2 92.7  PLT 220 198 207   Basic Metabolic Panel Recent Labs    64/15/83 0448 09/18/2017 1524 09/16/2017 0411  NA 125* 124* 125*  K 2.9* 3.5 3.1*  CL 77* 79* 80*  CO2 32 31 31  GLUCOSE 108* 180* 205*  BUN 51* 51* 50*  CREATININE 2.20* 2.37* 2.20*  CALCIUM 7.9* 7.8* 7.8*  PHOS 3.4  --   --    Liver Function Tests Recent Labs    10/06/17 0511 09/12/2017 0448  AST 214*  --   ALT 484*  --   ALKPHOS 134*  --   BILITOT 1.2  --   PROT 5.4*  --   ALBUMIN 2.3* 2.3*   No  results for input(s): LIPASE, AMYLASE in the last 72 hours. Cardiac Enzymes No results for input(s): CKTOTAL, CKMB, CKMBINDEX, TROPONINI in the last 72 hours.  BNP: BNP (last 3 results) Recent Labs    12/13/16 0949 02/11/17 1049 09/26/2017 1218  BNP 369.2* 562.7* 2,485.9*    ProBNP (last 3 results) No results for input(s): PROBNP in the last 8760 hours.   D-Dimer No results for input(s): DDIMER in the last 72 hours. Hemoglobin A1C No results for input(s): HGBA1C in the last 72 hours. Fasting Lipid Panel No results for input(s): CHOL, HDL, LDLCALC, TRIG, CHOLHDL, LDLDIRECT in the last 72 hours. Thyroid Function Tests No results for input(s): TSH, T4TOTAL, T3FREE, THYROIDAB in the last 72 hours.  Invalid input(s): FREET3  Other results:   Imaging    Dg Chest Port 1 View  Result Date: 10/08/2017 CLINICAL DATA:   Pneumonia, hypertension EXAM: PORTABLE CHEST 1 VIEW COMPARISON:  10/05/2017 FINDINGS: Patchy right lower lobe opacity, increased. Mild left basilar opacity, grossly unchanged. No frank interstitial edema. No definite pleural effusions. No pneumothorax. Right chest port terminating in the left brachiocephalic vein. Cardiomegaly. IMPRESSION: Progressive right lower lobe opacity, compatible with pneumonia. Mild left basilar opacity, possibly atelectasis. Cardiomegaly.  No frank interstitial edema. Stable right chest port terminating in the left brachiocephalic vein. Electronically Signed   By: Bryan Harmon M.D.   On: 09/19/2017 14:37     Medications:     Scheduled Medications: . Chlorhexidine Gluconate Cloth  6 each Topical Daily  . feeding supplement (ENSURE ENLIVE)  237 mL Oral BID BM  . mouth rinse  15 mL Mouth Rinse BID  . potassium chloride  20 mEq Oral QID  . potassium chloride  40 mEq Oral Once  . sodium chloride flush  10-40 mL Intracatheter Q12H  . sodium chloride flush  3 mL Intravenous Q12H  . sodium chloride flush  3 mL Intravenous Q12H  . tolvaptan  30 mg Oral Once    Infusions: . sodium chloride Stopped (10/06/2017 1700)  . sodium chloride    . sodium chloride    . sodium chloride 10 mL/hr at 10-Oct-2017 0700  . amiodarone 60 mg/hr (10/10/2017 0700)  . cefTRIAXone (ROCEPHIN)  IV Stopped (Oct 10, 2017 1610)  . furosemide    . heparin 2,100 Units/hr (10-10-2017 0700)  . milrinone 0.375 mcg/kg/min (October 10, 2017 0700)  . norepinephrine (LEVOPHED) Adult infusion 6 mcg/min (10-10-2017 0700)    PRN Medications: sodium chloride, sodium chloride, acetaminophen, albuterol, ALPRAZolam, ondansetron (ZOFRAN) IV, sodium chloride flush, sodium chloride flush, sodium chloride flush    Patient Profile  [name] is a 67 year old with a history of PAF S/P DC-CV 11/2016, s/p bilateral inguinal hernia repair 11/2017, PVCs, HTN, NICM, chronic systolic heart failure.   Sent from Urgent Care with A fib  RVR. Acutely SOB on arrival   Assessment/Plan   1. Acute hypoxemic respiratory failure: RLL PNA, Strep pneumoniae in blood cultures. Respiratory cultures with parainfluenzae virus. Also acute/chronic systolic CHF. -Covering withceftriaxone currently, now off doxycycline.  -CVP 14. Continue IV diuresis.  2.Acute on chronic systolic CHF: Nonischemic cardiomyopathy. 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 Etiology of his CHF is not clear =>no definite inciting event. He has a history of HTN but doubt this was the only trigger. 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 This would ideally be confirmed by cMRI, but he has not wanted an MRI (concerned about side effects). He also has frequent PVCs, 21%  total on last holter in 4/18 which is a risk for fall in EF.No family history of CMP. Cannot rule out viral myocarditis. SPEP negative. With medical management, he initially felt a lot better. However, he quit all his meds in early 2018 with recurrence of NYHA III symptoms and onset of atrial fibrillation.He had TEE-DCCV in 3/18.TEE showed that EF remained25%. He quit his meds again in 4/18 and apparently did not restart them when I asked him to in 6/18. No meds probably since 4/18. Echo this admission with EF 15-20%, severe RV dysfunction, LV apical thrombus.  Attempted DCCV on 1/28 failed likely due to pressors/inotropes.  Remains on milrinone 0.375 + norepinephrine 6. Co-ox good 68% this am with CVP ~14-15.  - Continue Lasix 120 mg IV every 8 hrs and will add dose of tolvaptan 30 mg x 1.  Somewhat reticent to add metolazone with hyponatremia but can use if he does not diurese well today.  - Continue current milrinone + norepinephrine.  -ControlHR with amiodarone gtt (better around 110) but ultimately needs NSR as HR up to 130s-140s with any movement. Failed DCCV 1/28, do not think he is going to hold  NSR on inotropes/pressors.   - I am going to place a Swan today for more formal assessment of filling pressures/cardiac output and will leave in IJ.  Have discussed with Dr. Donata Clay, tentative plan for Impella 5.0 tomorrow to try to allow [place] to titrate off vasoactive meds and cardiovert.  If unable to titrate off Impella, may be eventual LVAD candidate if renal function improves.   3. 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 It is possible that the PVCs contribute to his cardiomyopathy. -Onamiodarone. No change. 4. Atrial fibrillation:Persistent, now with RVR. HR up to 140sinitially, now 110s on amiodarone gtt at60. Not sure how long he has been in atrial fibrillation with marked RVR, but this may have triggered his CHF and AKI due to worsening of cardiac output.  - Need to slow HR,continueamiodarone gtt at60mg /hrfor now (HR 110s).  -Onheparin gtt for anticoagulation with AKI.  - As above, failed TEE-guided DCCV on 1/28.  5. AKI: Creatinine 1.08 in 6/18, he has not been on any meds. Suspect cardiorenal syndrome with afib/RVR and fall in cardiac output.He was initially hyperkalemic and acidotic.BUN/creatinine still high (creatinine 2.2) but stable today. -Nephrology following. - Will try to maintain cardiac output and BP to avoid further hemodynamic damage.  6. ZD:GUYQI pneumo PNA as well as parainfluenza virus.CT chest 1/29 with RLL PNA. Afebrile, WBCs normal.  - Recultured yesterday.  - Continues Ceftriaxone, CCM did not recommend broadening therapy.   7. Elevated LFTs: Suspect shock liver type picture. - AST/ALT > 1000 at admission but have trended down steadily.  8. LV thrombus: Noted on TTE and TEE.  - On heparin gtt.  9. Hyponatremia: Hypervolemic hyponatremia. - Na remains low at 125 this am. Increase tolvaptan to 30 mg today.  10. Malnutrition: Continue Ensure. Encourge po intake post procedure.   CRITICAL CARE Performed by: Marca Ancona  Total critical care time: 35 minutes  Critical care time was exclusive of separately billable procedures and treating other patients.  Critical care was necessary to treat or prevent imminent or life-threatening deterioration.  Critical care was time spent personally by me on the following activities: development of treatment plan with patient and/or surrogate as well as nursing, discussions with consultants, evaluation of patient's response to treatment, examination of patient, obtaining history from patient or surrogate,  ordering and performing treatments and interventions, ordering and review of laboratory studies, ordering and review of radiographic studies, pulse oximetry and re-evaluation of patient's condition.  Marca Ancona 09/29/2017 7:46 AM

## 2017-10-08 NOTE — Anesthesia Preprocedure Evaluation (Addendum)
Anesthesia Evaluation  Patient identified by MRN, date of birth, ID band Patient awake and Patient confused    Reviewed: Allergy & Precautions, NPO status , Patient's Chart, lab work & pertinent test results  History of Anesthesia Complications Negative for: history of anesthetic complications  Airway Mallampati: II  TM Distance: >3 FB Neck ROM: Full    Dental  (+) Edentulous Upper, Edentulous Lower   Pulmonary pneumonia, unresolved, COPD, former smoker,       rales    Cardiovascular hypertension, Pt. on medications (-) angina+CHF  + dysrhythmias Atrial Fibrillation  Rhythm:Irregular Rate:Tachycardia  On Levophed and Milrinone 1/28 TEE: EF 10-15%, L apical thrombus, mod MR, mod TR   Neuro/Psych negative neurological ROS     GI/Hepatic negative GI ROS, Neg liver ROS,   Endo/Other  negative endocrine ROS  Renal/GU Renal InsufficiencyRenal disease (creat 2.38)     Musculoskeletal   Abdominal   Peds  Hematology negative hematology ROS (+)   Anesthesia Other Findings   Reproductive/Obstetrics                            Anesthesia Physical Anesthesia Plan  ASA: IV  Anesthesia Plan: General   Post-op Pain Management:    Induction: Intravenous  PONV Risk Score and Plan: 2 and Treatment may vary due to age or medical condition  Airway Management Planned: Oral ETT  Additional Equipment: Arterial line, PA Cath, TEE, CVP and Ultrasound Guidance Line Placement  Intra-op Plan:   Post-operative Plan: Post-operative intubation/ventilation  Informed Consent: I have reviewed the patients History and Physical, chart, labs and discussed the procedure including the risks, benefits and alternatives for the proposed anesthesia with the patient or authorized representative who has indicated his/her understanding and acceptance.   Consent reviewed with POA  Plan Discussed with: CRNA and  Surgeon  Anesthesia Plan Comments: (Plan routine monitors, A line, existing PA cath, GETA with TEE and post op ventilation)        Anesthesia Quick Evaluation

## 2017-10-08 NOTE — Progress Notes (Signed)
ANTICOAGULATION CONSULT NOTE   Pharmacy Consult for Heparin  Indication: atrial fibrillation + LV apical thrombus  No Known Allergies  Vital Signs: Temp: 98.2 F (36.8 C) (01/29 0750) Temp Source: Oral (01/29 0750) BP: 88/64 (01/29 1000) Pulse Rate: 106 (01/29 1000)  Labs: Recent Labs    10/06/17 0511  10/06/2017 0448 09/10/2017 1111 10/02/2017 1524 09/23/2017 1830 10/10/2017 0411  HGB 11.1*  --   --  11.0*  --   --  12.2*  HCT 32.9*  --   --  32.7*  --   --  35.5*  PLT 220  --   --  198  --   --  207  HEPARINUNFRC 0.22*   < > 0.18* 0.39  --  0.35 0.43  CREATININE 2.28*   < > 2.20*  --  2.37*  --  2.20*   < > = values in this interval not displayed.    Estimated Creatinine Clearance: 34.6 mL/min (A) (by C-G formula based on SCr of 2.2 mg/dL (H)).  Assessment: 66yom with LV apical thrombus on IV heparin. ECHO showing again small LV apical thrombus but no LA appendage thrombus. Underwent DCCV this morning.   Heparin level is therapeutic at 0.43, on 2100 units/hr. Hgb is stable at 12.2, platelet is stable around 207. No issues with infusion per RN. No signs/symptoms of bleeding   Goal of Therapy:  Heparin level 0.3-0.7 units/ml Monitor platelets by anticoagulation protocol: Yes   Plan:  Continue heparin at 2100 units/hr Daily heparin level and CBC Follow up after Theone Murdoch cath today   Girard Cooter, PharmD Clinical Pharmacist  Pager: [phone] Clinical Phone for 09/13/2017 until 3:30pm: [phone] If after 3:30pm, please call main pharmacy at [phone]  09/27/2017 10:43 AM

## 2017-10-08 NOTE — H&P (View-Only) (Signed)
Patient ID: Bryan Harmon, male   DOB: [birth year], 67 y.o.   MRN: [id number]     Advanced Heart Failure Rounding Note  PCP:  Primary Cardiologist: [name]   Subjective:    Coox 68% on Milrinone 0.375 mcg/kg/min and 6 norepi. CVP 14 today. Creatinine 2.2 this am, stable.   Remains in Afib 110s at rest.  Last SBP in 100s.  DCCV yesterday, in NSR with intermittent afib then back to afib.  Respiratory virus screen: Parainfluenza virus.  Bld CX- Strep pneumoniae--> IV rocephin.  CXR with persistent RLL PNA.  Seen by CCM yesterday, did not recommend broadening abx.  WBCs not elevated.   Echo: Severe LV dilation, EF 15-20%, severely dilated RV with severely decreased RV systolic function, moderate-severe MR, moderate-severe TR, cannot rule out LV apical thrombus.  Repeat echo with Definity showed definite LV apical thrombus.   TEE (1/28): EF 10-15%, diffuse hypokinesis, small LV apical thrombus, mildly dilated RV with moderately decreased systolic function, no LAA thrombus.    Objective:   Weight Range: 179 lb 7.3 oz (81.4 kg) Body mass index is 25.75 kg/m.   Vital Signs:   Temp:  [97.8 F (36.6 C)-99 F (37.2 C)] 98.3 F (36.8 C) (01/29 0400) Pulse Rate:  [26-155] 118 (01/29 0700) Resp:  [11-30] 28 (01/29 0700) BP: (70-117)/(44-92) 106/85 (01/29 0700) SpO2:  [87 %-100 %] 97 % (01/29 0700) Weight:  [179 lb 7.3 oz (81.4 kg)] 179 lb 7.3 oz (81.4 kg) (01/29 0500) Last BM Date: 10/03/17  Weight change: Filed Weights   10/06/17 0500 09/22/2017 0455 09/26/2017 0500  Weight: 182 lb 15.7 oz (83 kg) 183 lb 3.2 oz (83.1 kg) 179 lb 7.3 oz (81.4 kg)    Intake/Output:   Intake/Output Summary (Last 24 hours) at 09/10/2017 0746 Last data filed at 09/26/2017 0700 Gross per 24 hour  Intake 4314.83 ml  Output 3850 ml  Net 464.83 ml      Physical Exam  CVP 14  General: chronically ill-appearing Neck: JVP 14 cm, no thyromegaly or thyroid nodule.  Lungs: Decreased breath sounds right  base.  CV: Lateral PMI.  Heart tachy, irregular S1/S2, no S3/S4, no murmur.  2+ edema to knees bilaterally.   Abdomen: Soft, nontender, no hepatosplenomegaly, no distention.  Skin: Intact without lesions or rashes.  Neurologic: Alert and oriented x 3.  Psych: Normal affect. Extremities: No clubbing or cyanosis.  HEENT: Normal.    Telemetry   Afib 110s, personally reviewed.   EKG    No new tracings.    Labs    CBC Recent Labs    10/06/17 0511 09/28/2017 1111 09/18/2017 0411  WBC 6.0 6.4 7.3  NEUTROABS 5.2  --  6.7  HGB 11.1* 11.0* 12.2*  HCT 32.9* 32.7* 35.5*  MCV 93.5 93.2 92.7  PLT 220 198 207   Basic Metabolic Panel Recent Labs    64/15/83 0448 09/18/2017 1524 09/16/2017 0411  NA 125* 124* 125*  K 2.9* 3.5 3.1*  CL 77* 79* 80*  CO2 32 31 31  GLUCOSE 108* 180* 205*  BUN 51* 51* 50*  CREATININE 2.20* 2.37* 2.20*  CALCIUM 7.9* 7.8* 7.8*  PHOS 3.4  --   --    Liver Function Tests Recent Labs    10/06/17 0511 09/12/2017 0448  AST 214*  --   ALT 484*  --   ALKPHOS 134*  --   BILITOT 1.2  --   PROT 5.4*  --   ALBUMIN 2.3* 2.3*   No  results for input(s): LIPASE, AMYLASE in the last 72 hours. Cardiac Enzymes No results for input(s): CKTOTAL, CKMB, CKMBINDEX, TROPONINI in the last 72 hours.  BNP: BNP (last 3 results) Recent Labs    12/13/16 0949 02/11/17 1049 09/28/2017 1218  BNP 369.2* 562.7* 2,485.9*    ProBNP (last 3 results) No results for input(s): PROBNP in the last 8760 hours.   D-Dimer No results for input(s): DDIMER in the last 72 hours. Hemoglobin A1C No results for input(s): HGBA1C in the last 72 hours. Fasting Lipid Panel No results for input(s): CHOL, HDL, LDLCALC, TRIG, CHOLHDL, LDLDIRECT in the last 72 hours. Thyroid Function Tests No results for input(s): TSH, T4TOTAL, T3FREE, THYROIDAB in the last 72 hours.  Invalid input(s): FREET3  Other results:   Imaging    Dg Chest Port 1 View  Result Date: 10/09/2017 CLINICAL DATA:   Pneumonia, hypertension EXAM: PORTABLE CHEST 1 VIEW COMPARISON:  10/05/2017 FINDINGS: Patchy right lower lobe opacity, increased. Mild left basilar opacity, grossly unchanged. No frank interstitial edema. No definite pleural effusions. No pneumothorax. Right chest port terminating in the left brachiocephalic vein. Cardiomegaly. IMPRESSION: Progressive right lower lobe opacity, compatible with pneumonia. Mild left basilar opacity, possibly atelectasis. Cardiomegaly.  No frank interstitial edema. Stable right chest port terminating in the left brachiocephalic vein. Electronically Signed   By: Charline Bills M.D.   On: 10/10/2017 14:37     Medications:     Scheduled Medications: . Chlorhexidine Gluconate Cloth  6 each Topical Daily  . feeding supplement (ENSURE ENLIVE)  237 mL Oral BID BM  . mouth rinse  15 mL Mouth Rinse BID  . potassium chloride  20 mEq Oral QID  . potassium chloride  40 mEq Oral Once  . sodium chloride flush  10-40 mL Intracatheter Q12H  . sodium chloride flush  3 mL Intravenous Q12H  . sodium chloride flush  3 mL Intravenous Q12H  . tolvaptan  30 mg Oral Once    Infusions: . sodium chloride Stopped (09/19/2017 1700)  . sodium chloride    . sodium chloride    . sodium chloride 10 mL/hr at 10-Oct-2017 0700  . amiodarone 60 mg/hr (10/10/2017 0700)  . cefTRIAXone (ROCEPHIN)  IV Stopped (Oct 10, 2017 1610)  . furosemide    . heparin 2,100 Units/hr (10-10-2017 0700)  . milrinone 0.375 mcg/kg/min (October 10, 2017 0700)  . norepinephrine (LEVOPHED) Adult infusion 6 mcg/min (10-10-2017 0700)    PRN Medications: sodium chloride, sodium chloride, acetaminophen, albuterol, ALPRAZolam, ondansetron (ZOFRAN) IV, sodium chloride flush, sodium chloride flush, sodium chloride flush    Patient Profile  [name] is a 67 year old with a history of PAF S/P DC-CV 11/2016, s/p bilateral inguinal hernia repair 11/2017, PVCs, HTN, NICM, chronic systolic heart failure.   Sent from Urgent Care with A fib  RVR. Acutely SOB on arrival   Assessment/Plan   1. Acute hypoxemic respiratory failure: RLL PNA, Strep pneumoniae in blood cultures. Respiratory cultures with parainfluenzae virus. Also acute/chronic systolic CHF. -Covering withceftriaxone currently, now off doxycycline.  -CVP 14. Continue IV diuresis.  2.Acute on chronic systolic CHF: Nonischemic cardiomyopathy. 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 Etiology of his CHF is not clear =>no definite inciting event. He has a history of HTN but doubt this was the only trigger. 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 This would ideally be confirmed by cMRI, but he has not wanted an MRI (concerned about side effects). He also has frequent PVCs, 21%  total on last holter in 4/18 which is a risk for fall in EF.No family history of CMP. Cannot rule out viral myocarditis. SPEP negative. With medical management, he initially felt a lot better. However, he quit all his meds in early 2018 with recurrence of NYHA III symptoms and onset of atrial fibrillation.He had TEE-DCCV in 3/18.TEE showed that EF remained25%. He quit his meds again in 4/18 and apparently did not restart them when I asked him to in 6/18. No meds probably since 4/18. Echo this admission with EF 15-20%, severe RV dysfunction, LV apical thrombus.  Attempted DCCV on 1/28 failed likely due to pressors/inotropes.  Remains on milrinone 0.375 + norepinephrine 6. Co-ox good 68% this am with CVP ~14-15.  - Continue Lasix 120 mg IV every 8 hrs and will add dose of tolvaptan 30 mg x 1.  Somewhat reticent to add metolazone with hyponatremia but can use if he does not diurese well today.  - Continue current milrinone + norepinephrine.  -ControlHR with amiodarone gtt (better around 110) but ultimately needs NSR as HR up to 130s-140s with any movement. Failed DCCV 1/28, do not think he is going to hold  NSR on inotropes/pressors.   - I am going to place a Swan today for more formal assessment of filling pressures/cardiac output and will leave in IJ.  Have discussed with Dr. Donata Clay, tentative plan for Impella 5.0 tomorrow to try to allow [place] to titrate off vasoactive meds and cardiovert.  If unable to titrate off Impella, may be eventual LVAD candidate if renal function improves.   3. 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 It is possible that the PVCs contribute to his cardiomyopathy. -Onamiodarone. No change. 4. Atrial fibrillation:Persistent, now with RVR. HR up to 140sinitially, now 110s on amiodarone gtt at60. Not sure how long he has been in atrial fibrillation with marked RVR, but this may have triggered his CHF and AKI due to worsening of cardiac output.  - Need to slow HR,continueamiodarone gtt at60mg /hrfor now (HR 110s).  -Onheparin gtt for anticoagulation with AKI.  - As above, failed TEE-guided DCCV on 1/28.  5. AKI: Creatinine 1.08 in 6/18, he has not been on any meds. Suspect cardiorenal syndrome with afib/RVR and fall in cardiac output.He was initially hyperkalemic and acidotic.BUN/creatinine still high (creatinine 2.2) but stable today. -Nephrology following. - Will try to maintain cardiac output and BP to avoid further hemodynamic damage.  6. ZD:GUYQI pneumo PNA as well as parainfluenza virus.CT chest 1/29 with RLL PNA. Afebrile, WBCs normal.  - Recultured yesterday.  - Continues Ceftriaxone, CCM did not recommend broadening therapy.   7. Elevated LFTs: Suspect shock liver type picture. - AST/ALT > 1000 at admission but have trended down steadily.  8. LV thrombus: Noted on TTE and TEE.  - On heparin gtt.  9. Hyponatremia: Hypervolemic hyponatremia. - Na remains low at 125 this am. Increase tolvaptan to 30 mg today.  10. Malnutrition: Continue Ensure. Encourge po intake post procedure.   CRITICAL CARE Performed by: Marca Ancona  Total critical care time: 35 minutes  Critical care time was exclusive of separately billable procedures and treating other patients.  Critical care was necessary to treat or prevent imminent or life-threatening deterioration.  Critical care was time spent personally by me on the following activities: development of treatment plan with patient and/or surrogate as well as nursing, discussions with consultants, evaluation of patient's response to treatment, examination of patient, obtaining history from patient or surrogate,  ordering and performing treatments and interventions, ordering and review of laboratory studies, ordering and review of radiographic studies, pulse oximetry and re-evaluation of patient's condition.  Marca Ancona 09/26/2017 7:46 AM

## 2017-10-08 NOTE — Progress Notes (Signed)
PULMONARY / CRITICAL CARE MEDICINE   Name: Bryan Harmon MRN: [id number] DOB: Feb 26, 1951    ADMISSION DATE:  10/25/17  CHIEF COMPLAINT: extreme fatigue  HISTORY OF PRESENT ILLNESS:        This is a 67 year old with a nonischemic cardiomyopathy and a last known ejection fraction of 20-25% associated with significant mitral regurgitation.  He also has a history of intermittent atrial fibrillation.  He presented this morning with complaints of extreme fatigue and he was found to be in atrial fibrillation with a rapid ventricular response.  In addition there is a subtle suggestion of a right lower lobe infiltrate on his chest x-ray.  Of note he is not aware when he is in atrial fibrillation.  He has not had any sense of palpitations or syncope.  He traces the beginning of his fatigue to approximately 8 days ago when he was suffering from watery diarrhea and intermittent subjective fever.  There was no blood in the stool and there was no associated abdominal pain.  He has not been taking any of his medications.  He does not feel as though his weight has increased or his dependent edema is greater than usual.  He is not suffering from more than usual orthopnea.  Pertinent to the subtle right lower lobe infiltrate he has not had any cough. Laboratory studies have shown an elevated lactic acid and elevated white count.  He also has had a significant rise in his creatinine from baseline and is hyperkalemic with a bicarbonate of only 12  Treatment in the department of emergency medicine has included an amiodarone infusion, doxycycline, Rocephin, Kayexalate, and a heparin infusion  SUBJECTIVE:  Appears weak and is wearing out.  VITAL SIGNS: BP (!) 85/69   Pulse (!) 111   Temp 98.2 F (36.8 C) (Oral)   Resp (!) 24   Ht 5\' 10"  (1.778 m)   Wt 81.4 kg (179 lb 7.3 oz)   SpO2 100%   BMI 25.75 kg/m   HEMODYNAMICS: CVP:  [9 mmHg-16 mmHg] 14 mmHg     10 mcg levophed    Levophed  off   VENTILATOR SETTINGS:    INTAKE / OUTPUT: I/O last 3 completed shifts: In: 5616.3 [P.O.:1415; I.V.:3803.3; IV Piggyback:398] Out: 6450 [Urine:6450]  PHYSICAL EXAMINATION: General: 67 year old male who looks older than stated age who is obviously tired and wearing out  HEENT: Positive JVD PSY: Dull effect Neuro: Moves all extremities CV: Atrial fibrillation ventricular response  PULM: Decreased breath sounds in the bases, faint crackles NT:ZGYF, non-tender, bsx4 active  Extremities: warm/dry, 3+ edema  Skin: no rashes or lesions   LABS:  BMET Recent Labs  Lab 10/05/2017 0448 09/21/2017 1524 10/03/2017 0411  NA 125* 124* 125*  K 2.9* 3.5 3.1*  CL 77* 79* 80*  CO2 32 31 31  BUN 51* 51* 50*  CREATININE 2.20* 2.37* 2.20*  GLUCOSE 108* 180* 205*   Electrolytes Recent Labs  Lab 10/04/17 1407  09/15/2017 0448 10/06/2017 1524 10/05/2017 0411  CALCIUM 8.2*   < > 7.9* 7.8* 7.8*  PHOS 3.9  --  3.4  --   --    < > = values in this interval not displayed.   CBC Recent Labs  Lab 10/06/17 0511 10/03/2017 1111 10/01/2017 0411  WBC 6.0 6.4 7.3  HGB 11.1* 11.0* 12.2*  HCT 32.9* 32.7* 35.5*  PLT 220 198 207   Coag's No results for input(s): APTT, INR in the last 168 hours.  Sepsis Markers Recent Labs  Lab 10/14/2017 1111 October 14, 2017 1524 10/04/2017 0411  PROCALCITON 4.95 4.72 4.42   ABG No results for input(s): PHART, PCO2ART, PO2ART in the last 168 hours. Liver Enzymes Recent Labs  Lab 10/04/17 0431  10/05/17 0430 10/06/17 0511 10/14/2017 0448  AST 497*  --  360* 214*  --   ALT 840*  --  680* 484*  --   ALKPHOS 190*  --  154* 134*  --   BILITOT 1.6*  --  1.3* 1.2  --   ALBUMIN 2.6*   < > 2.6* 2.3* 2.3*   < > = values in this interval not displayed.   Cardiac Enzymes No results for input(s): TROPONINI, PROBNP in the last 168 hours.  Glucose No results for input(s): GLUCAP in the last 168 hours.  Imaging Ct Chest Wo Contrast  Result Date: 09/10/2017 CLINICAL  DATA:  Follow-up pneumonia EXAM: CT CHEST WITHOUT CONTRAST TECHNIQUE: Multidetector CT imaging of the chest was performed following the standard protocol without IV contrast. COMPARISON:  Chest radiograph dated 09/29/2017 FINDINGS: Cardiovascular: Mild cardiomegaly.  No pericardial effusion. No evidence of thoracic aortic aneurysm. Mild atherosclerotic calcifications aortic arch. Coronary atherosclerosis the LAD. Right chest port terminates in the left brachiocephalic vein (series 3/image 52). Mediastinum/Nodes: Small mediastinal lymph nodes, including a dominant 12 mm high right paratracheal node at the thoracic inlet (series 3/image 30), likely reactive. Visualized thyroid is unremarkable. Lungs/Pleura: Right lower lobe consolidation. Additional patchy/ground-glass opacities all lobes, left lower lobe predominant. This appearance is compatible with multifocal pneumonia. No definite pleural effusions. No pneumothorax. Upper Abdomen: Visualized upper abdomen is notable for trace perihepatic ascites. Musculoskeletal: Exaggerated thoracic kyphosis with degenerative changes of the thoracolumbar spine. IMPRESSION: Multifocal pneumonia, lower lobe predominant, right greater than left. Aortic Atherosclerosis (ICD10-I70.0). Electronically Signed   By: Charline Bills M.D.   On: 09/10/2017 08:52   Dg Chest Port 1 View  Result Date: 10-14-17 CLINICAL DATA:  Pneumonia, hypertension EXAM: PORTABLE CHEST 1 VIEW COMPARISON:  10/05/2017 FINDINGS: Patchy right lower lobe opacity, increased. Mild left basilar opacity, grossly unchanged. No frank interstitial edema. No definite pleural effusions. No pneumothorax. Right chest port terminating in the left brachiocephalic vein. Cardiomegaly. IMPRESSION: Progressive right lower lobe opacity, compatible with pneumonia. Mild left basilar opacity, possibly atelectasis. Cardiomegaly.  No frank interstitial edema. Stable right chest port terminating in the left brachiocephalic vein.  Electronically Signed   By: Charline Bills M.D.   On: 2017/10/14 14:37   Lines/ Tubes 1/22 CVC  Cultures: 1/21 Blood>>Strep Pneumoniae pan sens 1/21 Resp Viral Panel>> + Parainfluenza Virus 3  ABX:  1/21>> Rocephin >>> 1/21>> Doxy 1/23  Studies: 10/01/2017>> Echo EF 15-20%, Apex ? PAP muscle vs. Thrombus, LV severely dilated, moderate concentric hypertrophy, systolic function normal, unable to evaluate LV diastolic function due to atrial fibrillation. + moderate spontaneous echo contrast, indicative of stasis Impression 1/23 Echo > Severely dilated LV with severe LV dysfunction globally with EF 15-20%. Severely dialted RV with severe RV dysfunction. Moderate   to severe MR with ERO 0.33cm2 and MR volume 41ml. Moderate to severe TR with moderate pulmonary HTN. Moderately thickened and calcified AV leaflets with mild MR, mildly dilated aortic root, massive biatrial enlargement. Cannot rule out LV thrombus. There is significant spontaneous echo contrast in LV c/w sluggish blood flow. The right ventricular systolic pressure was increased consistent with moderate pulmonary hypertension. 1/28 TEE >>>  DISCUSSION: This is a 67 year old with a cardiomyopathy who presents with extreme fatigue.  He is found to be in atrial  fibrillation with a rapid ventricular response.  He reports a recent history of watery diarrhea and has had a bump in his creatinine and developed a non-gap acidosis with an elevated potassium.  He also has a significantly elevated lactic acid.  If not for his leukocytosis, we would attribute his constellation of findings entirely to a low cardiac output state related to dehydration from his diarrhea and atrial fibrillation with a rapid ventricular response. He was on pressors for presumed cardiogenic shock, but eventually weaned off. His course then became complicated by AF RVR requiring cardioversion 1/28. Further complicated bu recurrent hypotension/shock.  129 considerations given  for right axillary Impela  ASSESSMENT / PLAN:  Shock: likely cardiogenic, but primary service questioning if there is a septic component.  Acute on Chronic Systolic Heart Failure LVEF 15% in patient who had stopped taking his meds LV thombus Atrial Fibrillation: s/p DCCV 1/28 -Tele monitoring -Map Goal > -Aggressive dialysis per HF team.  -Milrinone infusion -Norepinephrine infusion ( ) -Plans for antibiotic implantation 10/25/2017 for cardiovascular thoracic surgery and then hopefully be able to electively convert to sinus rhythm.  Strep Pneumoniae bacteremia  Parainfluenza virus 3 RLL infiltrate, likely PNA. Bacterial vs Viral.  Not resolved on CXR 1/28 after 7 days treatment. -Continue CTX for possible CAP. -Trend WBC/ Fever Curve -Re-culture as is clinically indicated -Will hold off on broadening ABX for now. -This may lead to intubation in the near future.  Acute Respiratory Failure 2/2 Acute on Chronic HF/ ? Infiltrates per CXR Pulmonary Edema -Titrate oxygen for sats > 92% -Diuresis per primary -See ID -Concern is that he will wear out and required intubation.  Acute renal failure 2/2 decompensated HF, A fib / Poor renal perfusion Metabolic acidosis. Improved Hyperkalemia improved Hypokalemia: replete  Hyponatremia:  Lab Results  Component Value Date   CREATININE 2.20 (H) 2017/10/25   CREATININE 2.37 (H) 09/11/2017   CREATININE 2.20 (H) 10/09/2017   CREATININE 1.11 03/25/2012     -Tolvaptain -Strict I&O -Fluid restriction -Replace electrolytes as indicated -Poor chronic HD candidate per renal -10/25/17 plan for is to place axillary Impella hopefully increase renal blood supply with improved renal function.  App cct  30 min   Brett Canales Minor ACNP [facility] Pager [phone] till 1 pm If no answer page [phone] October 25, 2017, 9:25 AM   25-Oct-2017 9:25 AM

## 2017-10-08 NOTE — Progress Notes (Signed)
Pt having frequent runs of VT lasting 5-10 beats since Gates placement.PAP 44/32 with line at 65. Pt looks frail, exhausted, and weak. BMET drawn to check electrolytes. Labored and accessory muscle use. Zoll placed on patient as precaution. RN paged MD. RN will continue to monitor.

## 2017-10-08 NOTE — Progress Notes (Signed)
Assessment/Plan:67 year old white male with cardiomyopathy with A. fib and RVR  1.Nonoliguric AKI -normal creatinine 7 months ago. Now with acute kidney injury, suspect is secondary to a hemodynamic cause-prolonged A. fib with decreased renal perfusion. renal ultrasound OK, U/A neg-  2. Hypertension/volume-he is volume overloaded. Decompensated heart failure  3. Cardiogenic shock 4. Hypervolemic hyponatremia 5. Low K, supp  Plan: Cardiology managing diuretics so renal will sign off.   Subjective: Interval History: good UOP with high dose diuretic  Objective: Vital signs in last 24 hours: Temp:  [98.2 F (36.8 C)-99.9 F (37.7 C)] 99.9 F (37.7 C) (01/29 1400) Pulse Rate:  [29-135] 115 (01/29 1400) Resp:  [0-36] 31 (01/29 1400) BP: (70-133)/(54-95) 103/86 (01/29 1400) SpO2:  [87 %-100 %] 96 % (01/29 1400) Weight:  [81.4 kg (179 lb 7.3 oz)] 81.4 kg (179 lb 7.3 oz) (01/29 0500) Weight change: -1.7 kg (-12 oz)  Intake/Output from previous day: 01/28 0701 - 01/29 0700 In: 4314.8 [P.O.:1415; I.V.:2501.8; IV Piggyback:398] Out: 3850 [Urine:3850] Intake/Output this shift: Total I/O In: 686.8 [I.V.:536.8; IV Piggyback:150] Out: 500 [Urine:500]  General appearance: alert and cooperative Resp: coarse BS Chest wall: no tenderness Cardio: regular rate and rhythm, S1, S2 normal, no murmur, click, rub or gallop Extremities: edema 2+  Lab Results: Recent Labs    10/06/2017 1111 09/13/2017 0411  WBC 6.4 7.3  HGB 11.0* 12.2*  HCT 32.7* 35.5*  PLT 198 207   BMET:  Recent Labs    09/29/2017 1524 10/02/2017 0411  NA 124* 125*  K 3.5 3.1*  CL 79* 80*  CO2 31 31  GLUCOSE 180* 205*  BUN 51* 50*  CREATININE 2.37* 2.20*  CALCIUM 7.8* 7.8*   No results for input(s): PTH in the last 72 hours. Iron Studies: No results for input(s): IRON, TIBC, TRANSFERRIN, FERRITIN in the last 72 hours. Studies/Results: Ct Chest Wo Contrast  Result Date: 09/29/2017 CLINICAL DATA:  Follow-up  pneumonia EXAM: CT CHEST WITHOUT CONTRAST TECHNIQUE: Multidetector CT imaging of the chest was performed following the standard protocol without IV contrast. COMPARISON:  Chest radiograph dated 09/29/2017 FINDINGS: Cardiovascular: Mild cardiomegaly.  No pericardial effusion. No evidence of thoracic aortic aneurysm. Mild atherosclerotic calcifications aortic arch. Coronary atherosclerosis the LAD. Right chest port terminates in the left brachiocephalic vein (series 3/image 52). Mediastinum/Nodes: Small mediastinal lymph nodes, including a dominant 12 mm high right paratracheal node at the thoracic inlet (series 3/image 30), likely reactive. Visualized thyroid is unremarkable. Lungs/Pleura: Right lower lobe consolidation. Additional patchy/ground-glass opacities all lobes, left lower lobe predominant. This appearance is compatible with multifocal pneumonia. No definite pleural effusions. No pneumothorax. Upper Abdomen: Visualized upper abdomen is notable for trace perihepatic ascites. Musculoskeletal: Exaggerated thoracic kyphosis with degenerative changes of the thoracolumbar spine. IMPRESSION: Multifocal pneumonia, lower lobe predominant, right greater than left. Aortic Atherosclerosis (ICD10-I70.0). Electronically Signed   By: Charline Bills M.D.   On: 09/17/2017 08:52   Dg Chest Port 1 View  Result Date: 10/10/2017 CLINICAL DATA:  Pneumonia, hypertension EXAM: PORTABLE CHEST 1 VIEW COMPARISON:  10/05/2017 FINDINGS: Patchy right lower lobe opacity, increased. Mild left basilar opacity, grossly unchanged. No frank interstitial edema. No definite pleural effusions. No pneumothorax. Right chest port terminating in the left brachiocephalic vein. Cardiomegaly. IMPRESSION: Progressive right lower lobe opacity, compatible with pneumonia. Mild left basilar opacity, possibly atelectasis. Cardiomegaly.  No frank interstitial edema. Stable right chest port terminating in the left brachiocephalic vein. Electronically  Signed   By: Charline Bills M.D.   On: 10/03/2017 14:37  Scheduled: . Chlorhexidine Gluconate Cloth  6 each Topical Daily  . feeding supplement (ENSURE ENLIVE)  237 mL Oral BID BM  . mouth rinse  15 mL Mouth Rinse BID  . potassium chloride  20 mEq Oral QID  . potassium chloride  40 mEq Oral Once  . sodium chloride flush  3 mL Intravenous Q12H      LOS: 8 days   Lauris Poag 2017/10/19,2:57 PM

## 2017-10-08 NOTE — Progress Notes (Signed)
Day of Surgery Procedure(s) (LRB): RIGHT HEART CATH (N/A) Subjective: Patient examined, most recent echocardiogram and images of CT scan of chest and most recent right heart cath data personally reviewed. Patient discussed with his cardiologist Dr. [name] for coordination of care.   67 year old male with nonischemic cardiomyopathy admitted with acute on chronic systolic heart failure last week. He has been followed at the advanced [facility] failure clinic for over a year. On admission he was in atrial fibrillation with right lower lobe pneumonia, probable viral, elevated creatinine and with fluid retention. He had low cardiac output and was treated with milrinone and norepinephrine diuretics antibiotics and anticoagulation. He has not shown significant response to this therapy and right heart cath was performed today which showed his cardiac index by Ernestine Conrad to be 1.6.  Dr. [name] has requested temporary percutaneous left ventricular assist device support with a Impella 5.0. After reviewing the patient's condition he is clearly not a candidate this time for implantable LVAD with biventricular failure renal failure viral pneumonia. Temporary non-sternotomy percutaneous LVAD would be his only option at this time for the potential for improvement so that he could be managed with conventional medical therapy or bridge to a implantable LVAD if he shows significant improvement.  I discussed the procedure of implantation of Impella 5.0 via right axillary artery incision under general anesthesia and he agrees to proceed.  Objective: Vital signs in last 24 hours: Temp:  [98.2 F (36.8 C)-100.2 F (37.9 C)] 99.5 F (37.5 C) (01/29 1830) Pulse Rate:  [29-135] 72 (01/29 1830) Cardiac Rhythm: Ventricular tachycardia (01/29 1537) Resp:  [0-36] 35 (01/29 1830) BP: (81-133)/(63-95) 104/87 (01/29 1830) SpO2:  [87 %-100 %] 96 % (01/29 1830) Weight:  [179 lb 7.3 oz (81.4 kg)] 179 lb 7.3 oz (81.4 kg) (01/29  0500)  Hemodynamic parameters for last 24 hours: PAP: (42-49)/(22-32) 45/27 CVP:  [7 mmHg-17 mmHg] 17 mmHg CO:  [2.1 L/min-3.2 L/min] 3.2 L/min CI:  [1.1 L/min/m2-1.6 L/min/m2] 1.6 L/min/m2  Intake/Output from previous day: 01/28 0701 - 01/29 0700 In: 4314.8 [P.O.:1415; I.V.:2501.8; IV Piggyback:398] Out: 3850 [Urine:3850] Intake/Output this shift: Total I/O In: 1153.2 [I.V.:941.2; IV Piggyback:212] Out: 500 [Urine:500]       Exam    General- patient acute and chronically ill-appearing breathless with speech    Neck- 2+ JVD, no cervical adenopathy palpable, no carotid bruit   Lungs-coarse breath sounds bilaterally   Cor- irregular heart rhythm, 3+ MR murmur, positive S4, gallop   Abdomen- soft, non-tenderbut with fluid   Extremities - edematous was TED hose   Neuro- oriented, generally weak but without, focal weakness   Lab Results: Recent Labs    09/20/2017 1111 10/05/2017 0411  WBC 6.4 7.3  HGB 11.0* 12.2*  HCT 32.7* 35.5*  PLT 198 207   BMET:  Recent Labs    09/19/2017 0411 10/05/2017 1439  NA 125* 125*  K 3.1* 3.9  CL 80* 79*  CO2 31 29  GLUCOSE 205* 150*  BUN 50* 52*  CREATININE 2.20* 2.33*  CALCIUM 7.8* 8.2*    PT/INR: No results for input(s): LABPROT, INR in the last 72 hours. ABG    Component Value Date/Time   PHART 7.352 09/19/2017 2308   HCO3 33.6 (H) 09/23/2017 1335   TCO2 35 (H) 10/03/2017 1335   ACIDBASEDEF 8.0 (H) 09/22/2017 2308   O2SAT 47.5 09/15/2017 1725   CBG (last 3)  No results for input(s): GLUCAP in the last 72 hours.  Assessment/Plan: S/P Procedure(s) (LRB): RIGHT HEART CATH (N/A)  Plan temporary percutaneous LVAD implantation tomorrow a.m.   LOS: 8 days    Bryan Harmon 09/17/2017

## 2017-10-09 ENCOUNTER — Inpatient Hospital Stay (HOSPITAL_COMMUNITY): Payer: Medicare Other

## 2017-10-09 ENCOUNTER — Inpatient Hospital Stay (HOSPITAL_COMMUNITY): Payer: Medicare Other | Admitting: Certified Registered"

## 2017-10-09 ENCOUNTER — Encounter (HOSPITAL_COMMUNITY): Admission: EM | Disposition: E | Payer: Self-pay | Source: Home / Self Care | Attending: Cardiology

## 2017-10-09 ENCOUNTER — Encounter (HOSPITAL_COMMUNITY): Payer: Self-pay | Admitting: Cardiology

## 2017-10-09 DIAGNOSIS — R570 Cardiogenic shock: Secondary | ICD-10-CM

## 2017-10-09 DIAGNOSIS — I5023 Acute on chronic systolic (congestive) heart failure: Secondary | ICD-10-CM

## 2017-10-09 HISTORY — PX: TEE WITHOUT CARDIOVERSION: SHX5443

## 2017-10-09 HISTORY — PX: PLACEMENT OF IMPELLA LEFT VENTRICULAR ASSIST DEVICE: SHX6519

## 2017-10-09 LAB — BLOOD GAS, ARTERIAL
Acid-Base Excess: 8.9 mmol/L — ABNORMAL HIGH (ref 0.0–2.0)
Bicarbonate: 32.3 mmol/L — ABNORMAL HIGH (ref 20.0–28.0)
Drawn by: 511911
FIO2: 36
O2 Saturation: 93.6 %
Patient temperature: 98.6
pCO2 arterial: 39.3 mmHg (ref 32.0–48.0)
pH, Arterial: 7.525 — ABNORMAL HIGH (ref 7.350–7.450)
pO2, Arterial: 65.8 mmHg — ABNORMAL LOW (ref 83.0–108.0)

## 2017-10-09 LAB — BASIC METABOLIC PANEL
Anion gap: 15 (ref 5–15)
BUN: 54 mg/dL — ABNORMAL HIGH (ref 6–20)
CO2: 29 mmol/L (ref 22–32)
Calcium: 8 mg/dL — ABNORMAL LOW (ref 8.9–10.3)
Chloride: 81 mmol/L — ABNORMAL LOW (ref 101–111)
Creatinine, Ser: 2.38 mg/dL — ABNORMAL HIGH (ref 0.61–1.24)
GFR calc Af Amer: 31 mL/min — ABNORMAL LOW (ref >60)
GFR calc non Af Amer: 27 mL/min — ABNORMAL LOW (ref >60)
Glucose, Bld: 142 mg/dL — ABNORMAL HIGH (ref 65–99)
Potassium: 4.2 mmol/L (ref 3.5–5.1)
Sodium: 125 mmol/L — ABNORMAL LOW (ref 135–145)

## 2017-10-09 LAB — COOXEMETRY PANEL
Carboxyhemoglobin: 0.8 % (ref 0.5–1.5)
Methemoglobin: 1 % (ref 0.0–1.5)
O2 Saturation: 46.6 %
Total hemoglobin: 12.5 g/dL (ref 12.0–16.0)

## 2017-10-09 LAB — COMPREHENSIVE METABOLIC PANEL
ALT: 239 U/L — ABNORMAL HIGH (ref 17–63)
AST: 112 U/L — ABNORMAL HIGH (ref 15–41)
Albumin: 2.2 g/dL — ABNORMAL LOW (ref 3.5–5.0)
Alkaline Phosphatase: 137 U/L — ABNORMAL HIGH (ref 38–126)
Anion gap: 13 (ref 5–15)
BUN: 55 mg/dL — ABNORMAL HIGH (ref 6–20)
CO2: 29 mmol/L (ref 22–32)
Calcium: 7.8 mg/dL — ABNORMAL LOW (ref 8.9–10.3)
Chloride: 81 mmol/L — ABNORMAL LOW (ref 101–111)
Creatinine, Ser: 2.31 mg/dL — ABNORMAL HIGH (ref 0.61–1.24)
GFR calc Af Amer: 32 mL/min — ABNORMAL LOW (ref >60)
GFR calc non Af Amer: 28 mL/min — ABNORMAL LOW (ref >60)
Glucose, Bld: 156 mg/dL — ABNORMAL HIGH (ref 65–99)
Potassium: 4.6 mmol/L (ref 3.5–5.1)
Sodium: 123 mmol/L — ABNORMAL LOW (ref 135–145)
Total Bilirubin: 0.8 mg/dL (ref 0.3–1.2)
Total Protein: 5.7 g/dL — ABNORMAL LOW (ref 6.5–8.1)

## 2017-10-09 LAB — POCT I-STAT 3, ART BLOOD GAS (G3+)
ACID-BASE EXCESS: 4 mmol/L — AB (ref 0.0–2.0)
ACID-BASE EXCESS: 12 mmol/L — AB (ref 0.0–2.0)
BICARBONATE: 29.8 mmol/L — AB (ref 20.0–28.0)
BICARBONATE: 36 mmol/L — AB (ref 20.0–28.0)
O2 SAT: 98 %
O2 Saturation: 100 %
PCO2 ART: 50.1 mmHg — AB (ref 32.0–48.0)
PH ART: 7.52 — AB (ref 7.350–7.450)
Patient temperature: 38.2
TCO2: 31 mmol/L (ref 22–32)
TCO2: 37 mmol/L — ABNORMAL HIGH (ref 22–32)
pCO2 arterial: 44.5 mmHg (ref 32.0–48.0)
pH, Arterial: 7.383 (ref 7.350–7.450)
pO2, Arterial: 107 mmHg (ref 83.0–108.0)
pO2, Arterial: 156 mmHg — ABNORMAL HIGH (ref 83.0–108.0)

## 2017-10-09 LAB — POCT I-STAT, CHEM 8
BUN: 47 mg/dL — AB (ref 6–20)
BUN: 48 mg/dL — AB (ref 6–20)
CREATININE: 2.4 mg/dL — AB (ref 0.61–1.24)
Calcium, Ion: 1.05 mmol/L — ABNORMAL LOW (ref 1.15–1.40)
Calcium, Ion: 1.02 mmol/L — ABNORMAL LOW (ref 1.15–1.40)
Chloride: 80 mmol/L — ABNORMAL LOW (ref 101–111)
Chloride: 81 mmol/L — ABNORMAL LOW (ref 101–111)
Creatinine, Ser: 2.9 mg/dL — ABNORMAL HIGH (ref 0.61–1.24)
Glucose, Bld: 158 mg/dL — ABNORMAL HIGH (ref 65–99)
Glucose, Bld: 151 mg/dL — ABNORMAL HIGH (ref 65–99)
HEMATOCRIT: 38 % — AB (ref 39.0–52.0)
HEMATOCRIT: 37 % — AB (ref 39.0–52.0)
Hemoglobin: 12.9 g/dL — ABNORMAL LOW (ref 13.0–17.0)
Hemoglobin: 12.6 g/dL — ABNORMAL LOW (ref 13.0–17.0)
POTASSIUM: 4.1 mmol/L (ref 3.5–5.1)
Potassium: 4.6 mmol/L (ref 3.5–5.1)
SODIUM: 124 mmol/L — AB (ref 135–145)
SODIUM: 123 mmol/L — AB (ref 135–145)
TCO2: 31 mmol/L (ref 22–32)
TCO2: 32 mmol/L (ref 22–32)

## 2017-10-09 LAB — CBC WITH DIFFERENTIAL/PLATELET
Band Neutrophils: 0 %
Basophils Absolute: 0 10*3/uL (ref 0.0–0.1)
Basophils Relative: 0 %
Blasts: 0 %
Eosinophils Absolute: 0 10*3/uL (ref 0.0–0.7)
Eosinophils Relative: 0 %
HCT: 36 % — ABNORMAL LOW (ref 39.0–52.0)
Hemoglobin: 12.4 g/dL — ABNORMAL LOW (ref 13.0–17.0)
Lymphocytes Relative: 6 %
Lymphs Abs: 0.5 10*3/uL — ABNORMAL LOW (ref 0.7–4.0)
MCH: 31.6 pg (ref 26.0–34.0)
MCHC: 34.4 g/dL (ref 30.0–36.0)
MCV: 91.8 fL (ref 78.0–100.0)
Metamyelocytes Relative: 0 %
Monocytes Absolute: 0.3 10*3/uL (ref 0.1–1.0)
Monocytes Relative: 3 %
Myelocytes: 0 %
Neutro Abs: 7.8 10*3/uL — ABNORMAL HIGH (ref 1.7–7.7)
Neutrophils Relative %: 91 %
Other: 0 %
Platelets: 163 10*3/uL (ref 150–400)
Promyelocytes Absolute: 0 %
RBC: 3.92 MIL/uL — ABNORMAL LOW (ref 4.22–5.81)
RDW: 14 % (ref 11.5–15.5)
WBC: 8.6 10*3/uL (ref 4.0–10.5)
nRBC: 0 /100 WBC

## 2017-10-09 LAB — POCT ACTIVATED CLOTTING TIME
Activated Clotting Time: 136 seconds
Activated Clotting Time: 125 seconds
Activated Clotting Time: 131 seconds

## 2017-10-09 LAB — PATHOLOGIST SMEAR REVIEW

## 2017-10-09 LAB — FIBRINOGEN: Fibrinogen: 556 mg/dL — ABNORMAL HIGH (ref 210–475)

## 2017-10-09 LAB — PROCALCITONIN: PROCALCITONIN: 6 ng/mL

## 2017-10-09 LAB — LACTATE DEHYDROGENASE: LDH: 443 U/L — ABNORMAL HIGH (ref 98–192)

## 2017-10-09 LAB — SURGICAL PCR SCREEN: Staphylococcus aureus: INVALID — AB

## 2017-10-09 LAB — MAGNESIUM
MAGNESIUM: 2.1 mg/dL (ref 1.7–2.4)
Magnesium: 2.1 mg/dL (ref 1.7–2.4)

## 2017-10-09 LAB — PROTIME-INR
INR: 1.33
Prothrombin Time: 16.4 seconds — ABNORMAL HIGH (ref 11.4–15.2)

## 2017-10-09 LAB — PHOSPHORUS
Phosphorus: 2.9 mg/dL (ref 2.5–4.6)
Phosphorus: 4.2 mg/dL (ref 2.5–4.6)

## 2017-10-09 LAB — HEPARIN LEVEL (UNFRACTIONATED): Heparin Unfractionated: 0.1 IU/mL — ABNORMAL LOW (ref 0.30–0.70)

## 2017-10-09 SURGERY — INSERTION, CARDIAC ASSIST DEVICE, IMPELLA
Anesthesia: General | Site: Chest

## 2017-10-09 MED ORDER — VANCOMYCIN HCL 1000 MG IV SOLR
INTRAVENOUS | Status: DC | PRN
  Administered 2017-10-09: 1000 mL

## 2017-10-09 MED ORDER — HEPARIN (PORCINE) IN NACL 100-0.45 UNIT/ML-% IJ SOLN
1200.0000 [IU]/h | INTRAMUSCULAR | Status: DC
  Administered 2017-10-10: 500 [IU]/h via INTRAVENOUS
  Administered 2017-10-10: 250 [IU]/h via INTRAVENOUS
  Administered 2017-10-11: 1400 [IU]/h via INTRAVENOUS
  Filled 2017-10-09 (×2): qty 250

## 2017-10-09 MED ORDER — HEPARIN SODIUM (PORCINE) 5000 UNIT/ML IJ SOLN
50000.0000 [IU] | INTRAVENOUS | Status: DC
  Administered 2017-10-09 – 2017-10-15 (×2): 50000 [IU]
  Filled 2017-10-09 (×6): qty 10

## 2017-10-09 MED ORDER — HEMOSTATIC AGENTS (NO CHARGE) OPTIME
TOPICAL | Status: DC | PRN
  Administered 2017-10-09: 1 via TOPICAL

## 2017-10-09 MED ORDER — PRO-STAT SUGAR FREE PO LIQD
30.0000 mL | Freq: Every day | ORAL | Status: DC
  Administered 2017-10-09 – 2017-10-10 (×2): 30 mL
  Filled 2017-10-09 (×2): qty 30

## 2017-10-09 MED ORDER — MIDAZOLAM HCL 10 MG/2ML IJ SOLN
INTRAMUSCULAR | Status: AC
Start: 2017-10-09 — End: 2017-10-09
  Filled 2017-10-09: qty 2

## 2017-10-09 MED ORDER — INSULIN ASPART 100 UNIT/ML ~~LOC~~ SOLN
2.0000 [IU] | SUBCUTANEOUS | Status: DC
  Administered 2017-10-09: 4 [IU] via SUBCUTANEOUS
  Administered 2017-10-10 (×3): 2 [IU] via SUBCUTANEOUS
  Administered 2017-10-11: 4 [IU] via SUBCUTANEOUS
  Administered 2017-10-11: 6 [IU] via SUBCUTANEOUS
  Administered 2017-10-11 – 2017-10-12 (×7): 4 [IU] via SUBCUTANEOUS
  Administered 2017-10-12: 2 [IU] via SUBCUTANEOUS
  Administered 2017-10-12: 4 [IU] via SUBCUTANEOUS
  Administered 2017-10-13 (×2): 2 [IU] via SUBCUTANEOUS
  Administered 2017-10-13: 4 [IU] via SUBCUTANEOUS
  Administered 2017-10-13: 3 [IU] via SUBCUTANEOUS
  Administered 2017-10-13: 2 [IU] via SUBCUTANEOUS
  Administered 2017-10-14 (×2): 4 [IU] via SUBCUTANEOUS
  Administered 2017-10-14 (×3): 2 [IU] via SUBCUTANEOUS
  Administered 2017-10-14: 4 [IU] via SUBCUTANEOUS
  Administered 2017-10-14 – 2017-10-15 (×2): 2 [IU] via SUBCUTANEOUS
  Administered 2017-10-15: 4 [IU] via SUBCUTANEOUS
  Administered 2017-10-15 – 2017-10-16 (×7): 2 [IU] via SUBCUTANEOUS
  Administered 2017-10-16: 4 [IU] via SUBCUTANEOUS
  Administered 2017-10-17 (×3): 2 [IU] via SUBCUTANEOUS
  Administered 2017-10-18 (×2): 4 [IU] via SUBCUTANEOUS
  Administered 2017-10-18 (×2): 2 [IU] via SUBCUTANEOUS
  Administered 2017-10-18: 4 [IU] via SUBCUTANEOUS
  Administered 2017-10-19 – 2017-10-20 (×6): 2 [IU] via SUBCUTANEOUS
  Administered 2017-10-21: 4 [IU] via SUBCUTANEOUS
  Administered 2017-10-21: 2 [IU] via SUBCUTANEOUS
  Administered 2017-10-21: 4 [IU] via SUBCUTANEOUS
  Administered 2017-10-22 – 2017-10-24 (×9): 2 [IU] via SUBCUTANEOUS
  Administered 2017-10-26: 3 [IU] via SUBCUTANEOUS
  Administered 2017-10-26 (×3): 4 [IU] via SUBCUTANEOUS
  Administered 2017-10-26 – 2017-10-27 (×4): 2 [IU] via SUBCUTANEOUS
  Administered 2017-10-27: 4 [IU] via SUBCUTANEOUS

## 2017-10-09 MED ORDER — PHENYLEPHRINE HCL 10 MG/ML IJ SOLN: INTRAVENOUS | Status: DC | PRN

## 2017-10-09 MED ORDER — SODIUM CHLORIDE 0.9 % IJ SOLN
INTRAMUSCULAR | Status: AC
  Filled 2017-10-09: qty 10

## 2017-10-09 MED ORDER — ORAL CARE MOUTH RINSE
15.0000 mL | Freq: Four times a day (QID) | OROMUCOSAL | Status: DC
  Administered 2017-10-09 – 2017-10-10 (×3): 15 mL via OROMUCOSAL

## 2017-10-09 MED ORDER — NOREPINEPHRINE BITARTRATE 1 MG/ML IV SOLN
0.0000 ug/min | INTRAVENOUS | Status: DC
  Filled 2017-10-09: qty 4

## 2017-10-09 MED ORDER — MILRINONE LACTATE IN DEXTROSE 20-5 MG/100ML-% IV SOLN
0.3000 ug/kg/min | INTRAVENOUS | Status: DC
  Filled 2017-10-09: qty 100

## 2017-10-09 MED ORDER — LACTATED RINGERS IV SOLN
INTRAVENOUS | Status: DC | PRN
  Administered 2017-10-09: 08:00:00 via INTRAVENOUS

## 2017-10-09 MED ORDER — WHITE PETROLATUM EX OINT
TOPICAL_OINTMENT | CUTANEOUS | Status: AC
  Administered 2017-10-09: 14:00:00
  Filled 2017-10-09: qty 28.35

## 2017-10-09 MED ORDER — MIDAZOLAM HCL 5 MG/5ML IJ SOLN
INTRAMUSCULAR | Status: DC | PRN
  Administered 2017-10-09 (×3): 1 mg via INTRAVENOUS
  Administered 2017-10-09: 3 mg via INTRAVENOUS
  Administered 2017-10-09: 1 mg via INTRAVENOUS

## 2017-10-09 MED ORDER — SODIUM CHLORIDE 0.9 % IV SOLN
1.0000 g | Freq: Two times a day (BID) | INTRAVENOUS | Status: DC
  Administered 2017-10-09 – 2017-10-15 (×12): 1 g via INTRAVENOUS
  Filled 2017-10-09 (×12): qty 1

## 2017-10-09 MED ORDER — DEXTROSE 5 % SOLN FOR IMPELLA PURGE CATHETER
INTRAVENOUS | Status: DC
  Administered 2017-10-09: 1000 mL
  Filled 2017-10-09 (×3): qty 1000

## 2017-10-09 MED ORDER — VITAL AF 1.2 CAL PO LIQD
1000.0000 mL | ORAL | Status: DC
  Administered 2017-10-09 – 2017-10-10 (×2): 1000 mL

## 2017-10-09 MED ORDER — VASOPRESSIN 20 UNIT/ML IV SOLN
0.0400 [IU]/min | INTRAVENOUS | Status: DC
  Filled 2017-10-09: qty 2

## 2017-10-09 MED ORDER — MILRINONE LACTATE IN DEXTROSE 20-5 MG/100ML-% IV SOLN
0.5000 ug/kg/min | INTRAVENOUS | Status: DC
  Administered 2017-10-09 – 2017-10-10 (×5): 0.5 ug/kg/min via INTRAVENOUS
  Administered 2017-10-11: 0.375 ug/kg/min via INTRAVENOUS
  Administered 2017-10-11: 0.5 ug/kg/min via INTRAVENOUS
  Administered 2017-10-12 – 2017-10-15 (×10): 0.375 ug/kg/min via INTRAVENOUS
  Administered 2017-10-16 (×2): 0.5 ug/kg/min via INTRAVENOUS
  Administered 2017-10-16: 0.375 ug/kg/min via INTRAVENOUS
  Administered 2017-10-17 – 2017-10-18 (×3): 0.5 ug/kg/min via INTRAVENOUS
  Filled 2017-10-09 (×23): qty 100

## 2017-10-09 MED ORDER — ETOMIDATE 2 MG/ML IV SOLN
INTRAVENOUS | Status: AC
  Filled 2017-10-09: qty 10

## 2017-10-09 MED ORDER — VITAL 1.5 CAL PO LIQD
1000.0000 mL | ORAL | Status: DC
  Filled 2017-10-09 (×2): qty 1000

## 2017-10-09 MED ORDER — FENTANYL CITRATE (PF) 100 MCG/2ML IJ SOLN
INTRAMUSCULAR | Status: DC | PRN
  Administered 2017-10-09: 500 ug via INTRAVENOUS

## 2017-10-09 MED ORDER — VANCOMYCIN HCL IN DEXTROSE 1-5 GM/200ML-% IV SOLN
1000.0000 mg | INTRAVENOUS | Status: DC
  Administered 2017-10-09 – 2017-10-13 (×5): 1000 mg via INTRAVENOUS
  Filled 2017-10-09 (×6): qty 200

## 2017-10-09 MED ORDER — MUPIROCIN 2 % EX OINT
1.0000 "application " | TOPICAL_OINTMENT | Freq: Two times a day (BID) | CUTANEOUS | Status: AC
  Administered 2017-10-09 – 2017-10-13 (×9): 1 via NASAL
  Filled 2017-10-09: qty 22

## 2017-10-09 MED ORDER — VITAL HIGH PROTEIN PO LIQD: 1000.0000 mL | ORAL | Status: DC

## 2017-10-09 MED ORDER — PHENYLEPHRINE HCL 10 MG/ML IJ SOLN
INTRAVENOUS | Status: DC | PRN
  Administered 2017-10-09: 25 ug/min via INTRAVENOUS

## 2017-10-09 MED ORDER — LIDOCAINE 2% (20 MG/ML) 5 ML SYRINGE
INTRAMUSCULAR | Status: DC | PRN
  Administered 2017-10-09: 100 mg via INTRAVENOUS

## 2017-10-09 MED ORDER — RIFAMPIN 600 MG IV SOLR
600.0000 mg | INTRAVENOUS | Status: DC
  Filled 2017-10-09: qty 600

## 2017-10-09 MED ORDER — DEXMEDETOMIDINE HCL IN NACL 400 MCG/100ML IV SOLN
0.1000 ug/kg/h | INTRAVENOUS | Status: DC
  Administered 2017-10-09 – 2017-10-10 (×3): 0.7 ug/kg/h via INTRAVENOUS
  Filled 2017-10-09 (×2): qty 100

## 2017-10-09 MED ORDER — DOBUTAMINE IN D5W 4-5 MG/ML-% IV SOLN
2.0000 ug/kg/min | INTRAVENOUS | Status: DC
  Filled 2017-10-09: qty 250

## 2017-10-09 MED ORDER — NOREPINEPHRINE BITARTRATE 1 MG/ML IV SOLN
0.0000 ug/min | INTRAVENOUS | Status: DC
  Administered 2017-10-09: 30 ug/min via INTRAVENOUS
  Administered 2017-10-10 (×2): 24 ug/min via INTRAVENOUS
  Administered 2017-10-11: 13 ug/min via INTRAVENOUS
  Administered 2017-10-11: 22 ug/min via INTRAVENOUS
  Administered 2017-10-13: 13.973 ug/min via INTRAVENOUS
  Administered 2017-10-14: 14 ug/min via INTRAVENOUS
  Administered 2017-10-15: 16 ug/min via INTRAVENOUS
  Administered 2017-10-15: 14 ug/min via INTRAVENOUS
  Administered 2017-10-16: 5 ug/min via INTRAVENOUS
  Administered 2017-10-17: 8 ug/min via INTRAVENOUS
  Administered 2017-10-18: 13 ug/min via INTRAVENOUS
  Administered 2017-10-19: 15 ug/min via INTRAVENOUS
  Administered 2017-10-21: 13 ug/min via INTRAVENOUS
  Administered 2017-10-23: 2 ug/min via INTRAVENOUS
  Administered 2017-10-24: 12 ug/min via INTRAVENOUS
  Administered 2017-10-25 – 2017-10-26 (×2): 15 ug/min via INTRAVENOUS
  Administered 2017-10-26 – 2017-10-27 (×2): 25 ug/min via INTRAVENOUS
  Filled 2017-10-09 (×22): qty 16

## 2017-10-09 MED ORDER — FLUCONAZOLE IN SODIUM CHLORIDE 400-0.9 MG/200ML-% IV SOLN
400.0000 mg | INTRAVENOUS | Status: DC
  Filled 2017-10-09: qty 200

## 2017-10-09 MED ORDER — HEPARIN SODIUM (PORCINE) 1000 UNIT/ML IJ SOLN
INTRAMUSCULAR | Status: DC | PRN
  Administered 2017-10-09: 2000 [IU] via INTRAVENOUS
  Administered 2017-10-09: 4000 [IU] via INTRAVENOUS

## 2017-10-09 MED ORDER — MORPHINE SULFATE (PF) 4 MG/ML IV SOLN
4.0000 mg | INTRAVENOUS | Status: DC | PRN
Start: 2017-10-09 — End: 2017-10-25
  Administered 2017-10-09 – 2017-10-25 (×24): 4 mg via INTRAVENOUS
  Filled 2017-10-09 (×25): qty 1

## 2017-10-09 MED ORDER — DEXTROSE 5 % IV SOLN
INTRAVENOUS | Status: DC | PRN
  Administered 2017-10-09: 1000 mL

## 2017-10-09 MED ORDER — CHLORHEXIDINE GLUCONATE 0.12% ORAL RINSE (MEDLINE KIT)
15.0000 mL | Freq: Two times a day (BID) | OROMUCOSAL | Status: DC
  Administered 2017-10-09: 15 mL via OROMUCOSAL

## 2017-10-09 MED ORDER — ACETAMINOPHEN 160 MG/5ML PO SOLN
650.0000 mg | ORAL | Status: DC | PRN
  Administered 2017-10-09 – 2017-10-11 (×5): 650 mg via ORAL
  Filled 2017-10-09 (×5): qty 20.3

## 2017-10-09 MED ORDER — ETOMIDATE 2 MG/ML IV SOLN
INTRAVENOUS | Status: DC | PRN
  Administered 2017-10-09: 6 mg via INTRAVENOUS

## 2017-10-09 MED ORDER — DEXTROSE 5 % IV SOLN
0.0000 ug/min | INTRAVENOUS | Status: DC
  Filled 2017-10-09: qty 4

## 2017-10-09 MED ORDER — POTASSIUM CHLORIDE 20 MEQ/15ML (10%) PO SOLN
20.0000 meq | Freq: Four times a day (QID) | ORAL | Status: DC
  Administered 2017-10-09 – 2017-10-13 (×17): 20 meq via ORAL
  Filled 2017-10-09 (×17): qty 15

## 2017-10-09 MED ORDER — FENTANYL CITRATE (PF) 250 MCG/5ML IJ SOLN
INTRAMUSCULAR | Status: AC
  Filled 2017-10-09: qty 10

## 2017-10-09 MED ORDER — DEXTROSE 5 % IV SOLN
0.0000 ug/min | INTRAVENOUS | Status: DC
  Filled 2017-10-09: qty 16

## 2017-10-09 MED ORDER — AMIODARONE HCL IN DEXTROSE 360-4.14 MG/200ML-% IV SOLN
30.0000 mg/h | INTRAVENOUS | Status: DC
  Administered 2017-10-10 – 2017-10-27 (×39): 30 mg/h via INTRAVENOUS
  Filled 2017-10-09 (×39): qty 200

## 2017-10-09 MED ORDER — HEPARIN (PORCINE) IN NACL 100-0.45 UNIT/ML-% IJ SOLN: 200.0000 [IU]/h | INTRAMUSCULAR | Status: DC

## 2017-10-09 MED ORDER — VANCOMYCIN HCL 1000 MG IV SOLR
1000.0000 mg | INTRAVENOUS | Status: DC
  Filled 2017-10-09: qty 1000

## 2017-10-09 MED ORDER — 0.9 % SODIUM CHLORIDE (POUR BTL) OPTIME
TOPICAL | Status: DC | PRN
  Administered 2017-10-09: 1000 mL

## 2017-10-09 MED ORDER — DEXMEDETOMIDINE HCL IN NACL 200 MCG/50ML IV SOLN
INTRAVENOUS | Status: DC | PRN
  Administered 2017-10-09: .5 ug/kg/h via INTRAVENOUS

## 2017-10-09 MED ORDER — ROCURONIUM BROMIDE 10 MG/ML (PF) SYRINGE
PREFILLED_SYRINGE | INTRAVENOUS | Status: DC | PRN
  Administered 2017-10-09 (×2): 50 mg via INTRAVENOUS
  Administered 2017-10-09: 70 mg via INTRAVENOUS
  Administered 2017-10-09: 20 mg via INTRAVENOUS

## 2017-10-09 MED FILL — Lidocaine HCl Local Inj 1%: INTRAMUSCULAR | Qty: 20 | Status: AC

## 2017-10-09 MED FILL — Heparin Sodium (Porcine) 2 Unit/ML in Sodium Chloride 0.9%: INTRAMUSCULAR | Qty: 500 | Status: AC

## 2017-10-09 SURGICAL SUPPLY — 58 items
ANGIOGRAPHIC GUIDWIRES ×2 IMPLANT
APL SRG 7X2 LUM MLBL SLNT (VASCULAR PRODUCTS) ×4
APPLICATOR TIP COSEAL (VASCULAR PRODUCTS) ×4 IMPLANT
BLADE SURG 12 STRL SS (BLADE) ×2 IMPLANT
CATH CROSS OVER TEMPO 5F (CATHETERS) ×2 IMPLANT
CATH EXPO 5FR AL1 (CATHETERS) ×4 IMPLANT
CATH INFINITI 6F MPB2 (CATHETERS) ×4 IMPLANT
CLIP VESOCCLUDE SM WIDE 24/CT (CLIP) ×4 IMPLANT
DRAPE C-ARM 42X72 X-RAY (DRAPES) ×12 IMPLANT
DRAPE CV SPLIT W-CLR ANES SCRN (DRAPES) ×4 IMPLANT
DRAPE PERI GROIN 82X75IN TIB (DRAPES) ×4 IMPLANT
DRAPE SLUSH/WARMER DISC (DRAPES) ×4 IMPLANT
DRAPE SURG IRRIG POUCH 19X23 (DRAPES) ×2 IMPLANT
DRSG AQUACEL AG ADV 3.5X10 (GAUZE/BANDAGES/DRESSINGS) ×4 IMPLANT
DRSG TEGADERM 4X4.75 (GAUZE/BANDAGES/DRESSINGS) ×4 IMPLANT
ELECT BLADE 4.0 EZ CLEAN MEGAD (MISCELLANEOUS) ×4
ELECTRODE BLDE 4.0 EZ CLN MEGD (MISCELLANEOUS) IMPLANT
FELT TEFLON 1X6 (MISCELLANEOUS) ×2 IMPLANT
GAUZE SPONGE 4X4 12PLY STRL LF (GAUZE/BANDAGES/DRESSINGS) ×4 IMPLANT
GAUZE XEROFORM 5X9 LF (GAUZE/BANDAGES/DRESSINGS) ×2 IMPLANT
GLOVE BIO SURGEON STRL SZ 6.5 (GLOVE) ×4 IMPLANT
GLOVE BIO SURGEON STRL SZ7 (GLOVE) ×2 IMPLANT
GLOVE BIO SURGEONS STRL SZ 6.5 (GLOVE) ×4
GLOVE BIOGEL M STRL SZ7.5 (GLOVE) ×8 IMPLANT
GLOVE BIOGEL PI IND STRL 6.5 (GLOVE) IMPLANT
GLOVE BIOGEL PI INDICATOR 6.5 (GLOVE) ×2
GOWN STRL REUS W/ TWL LRG LVL3 (GOWN DISPOSABLE) ×8 IMPLANT
GOWN STRL REUS W/TWL LRG LVL3 (GOWN DISPOSABLE) ×16
GRAFT HEMASHIELD 10MM (Graft) ×4 IMPLANT
GRAFT VASC STRG 30X10STRL (Graft) IMPLANT
INSERT FOGARTY SM (MISCELLANEOUS) ×4 IMPLANT
KIT BASIN OR (CUSTOM PROCEDURE TRAY) ×4 IMPLANT
LOOP VESSEL MINI RED (MISCELLANEOUS) ×4 IMPLANT
NS IRRIG 1000ML POUR BTL (IV SOLUTION) ×12 IMPLANT
PACK CHEST (CUSTOM PROCEDURE TRAY) ×4 IMPLANT
PAD ARMBOARD 7.5X6 YLW CONV (MISCELLANEOUS) ×8 IMPLANT
PAD ELECT DEFIB RADIOL ZOLL (MISCELLANEOUS) ×4 IMPLANT
POWDER SURGICEL 3.0 GRAM (HEMOSTASIS) ×2 IMPLANT
PUMP SET IMPELLA 5.0 AIC (CATHETERS) ×6 IMPLANT
SEALANT SURG COSEAL 8ML (VASCULAR PRODUCTS) ×4 IMPLANT
SPONGE LAP 18X18 X RAY DECT (DISPOSABLE) ×2 IMPLANT
STAPLER SKIN PROX WIDE 3.9 (STAPLE) ×2 IMPLANT
SUT ETHIBOND 2 0 SH (SUTURE) ×4
SUT ETHIBOND 2 0 SH 36X2 (SUTURE) IMPLANT
SUT ETHILON 3 0 PS 1 (SUTURE) ×8 IMPLANT
SUT PROLENE 5 0 C 1 36 (SUTURE) ×4 IMPLANT
SUT SILK  1 MH (SUTURE) ×8
SUT SILK 1 MH (SUTURE) ×8 IMPLANT
SUT SILK 1 TIES 10X30 (SUTURE) ×4 IMPLANT
SUT SILK 2 0 SH CR/8 (SUTURE) ×2 IMPLANT
SUT VIC AB 1 CTX 18 (SUTURE) ×2 IMPLANT
SUT VIC AB 2-0 CT1 27 (SUTURE) ×4
SUT VIC AB 2-0 CT1 TAPERPNT 27 (SUTURE) IMPLANT
TOWEL GREEN STERILE (TOWEL DISPOSABLE) ×6 IMPLANT
TOWEL GREEN STERILE FF (TOWEL DISPOSABLE) ×4 IMPLANT
TRAY FOLEY SILVER 16FR TEMP (SET/KITS/TRAYS/PACK) ×4 IMPLANT
WATER STERILE IRR 1000ML POUR (IV SOLUTION) ×6 IMPLANT
YANKAUER SUCT BULB TIP NO VENT (SUCTIONS) ×2 IMPLANT

## 2017-10-09 NOTE — Anesthesia Postprocedure Evaluation (Signed)
Anesthesia Post Note  Patient: Bryan Harmon  Procedure(s) Performed: PLACEMENT OF IMPELLA 5.0 LEFT VENTRICULAR ASSIST DEVICE (N/A Chest) TRANSESOPHAGEAL ECHOCARDIOGRAM (TEE) (N/A )     Patient location during evaluation: SICU Anesthesia Type: General Level of consciousness: patient remains intubated per anesthesia plan and sedated Pain management: pain level controlled Respiratory status: patient remains intubated per anesthesia plan and patient on ventilator - see flowsheet for VS Cardiovascular status: stable Postop Assessment: no apparent nausea or vomiting Anesthetic complications: no    Last Vitals:  Vitals:   11/03/17 1121 11/03/17 1159  BP: 124/74   Pulse: (!) 112   Resp: 12   Temp:    SpO2: 92% 100%    Last Pain:  Vitals:   November 03, 2017 1159  TempSrc: Core (Comment)  PainSc:                  Bryan Harmon,Bryan Harmon

## 2017-10-09 NOTE — Progress Notes (Signed)
Cortrak Tube Team Note:  Consult received to place a Cortrak feeding tube.   A 10 F Cortrak tube was placed in the R nare and secured with a nasal bridle at 100 cm. Per the Cortrak monitor reading the tube tip is post pyloric.   X-ray is required, abdominal x-ray has been ordered by the Cortrak team. Please confirm tube placement before using the Cortrak tube.   If the tube becomes dislodged please keep the tube and contact the Cortrak team at [email or web address] (password [email or web address]) for replacement.  If after hours and replacement cannot be delayed, place a NG tube and confirm placement with an abdominal x-ray.    Earma Reading, MS, RD, LDN Pager: [phone] Weekend/After Hours: [phone]

## 2017-10-09 NOTE — Progress Notes (Signed)
ANTICOAGULATION CONSULT NOTE   Pharmacy Consult for Heparin with Impella 5.0 Indication: atrial fibrillation + LV apical thrombus  Labs: Recent Labs    10/04/2017 1111  09/26/2017 1830 10/02/2017 0005 10/02/2017 0411 09/27/2017 1439 09/29/2017 0430 10/08/2017 0836 09/20/2017 1158  HGB 11.0*  --   --   --  12.2*  --  12.4* 12.9*  --   HCT 32.7*  --   --   --  35.5*  --  36.0* 38.0*  --   PLT 198  --   --   --  207  --  163  --   --   LABPROT  --   --   --  16.4*  --   --   --   --   --   INR  --   --   --  1.33  --   --   --   --   --   HEPARINUNFRC 0.39  --  0.35  --  0.43  --   --   --  0.10*  CREATININE  --    < >  --  2.31* 2.20* 2.33* 2.38* 2.40*  --    < > = values in this interval not displayed.    Estimated Creatinine Clearance: 31.7 mL/min (A) (by C-G formula based on SCr of 2.4 mg/dL (H)).  Assessment: 66yom with LV apical thrombus on IV heparin. ECHO showing again small LV apical thrombus but no LA appendage thrombus.   Impella 5.0 placed this morning. Heparin purge solution to start tonight at 6PM (starting at 350 units/hr). Talked with Dr Donata Clay and will not start systemic heparin overnight.   Goal of Therapy:  ACT 160-180 while on Impella   Plan:  Start impella purge solution tonight at 1800 Follow ACT Will follow up with [facility] tomorrow on systemic heparin start time  Girard Cooter, PharmD Clinical Pharmacist  Pager: [phone] Clinical Phone for 09/13/2017 until 3:30pm: [phone] If after 3:30pm, please call main pharmacy at [phone]  10/09/2017 2:37 PM

## 2017-10-09 NOTE — Anesthesia Procedure Notes (Addendum)
Procedure Name: Intubation Date/Time: 09/12/2017 8:09 AM Performed by: Glynda Jaeger, CRNA Pre-anesthesia Checklist: Emergency Drugs available, Patient identified, Suction available, Patient being monitored and Timeout performed Patient Re-evaluated:Patient Re-evaluated prior to induction Oxygen Delivery Method: Circle system utilized Preoxygenation: Pre-oxygenation with 100% oxygen Induction Type: IV induction Ventilation: Mask ventilation without difficulty Laryngoscope Size: Mac and 4 Grade View: Grade I Tube size: 8.0 mm Number of attempts: 1 Airway Equipment and Method: Rigid stylet Placement Confirmation: ETT inserted through vocal cords under direct vision,  positive ETCO2 and breath sounds checked- equal and bilateral Secured at: 24 cm Tube secured with: Tape Dental Injury: Teeth and Oropharynx as per pre-operative assessment  Comments: Upon DVL, oropharynx full of bloody secretions and irritated. Patient states, prior to induction, recent dental extractions. Intubation by Alverda Skeans

## 2017-10-09 NOTE — Progress Notes (Signed)
Nutrition Follow-up  DOCUMENTATION CODES:   Non-severe (moderate) malnutrition in context of chronic illness  INTERVENTION:   Tube Feeding:  Vital AF 1.2 @ 60 ml/hr Pro-Stat 30 mL daily Provides 1828 kcals, 123 g of protein and 1166 mL of free water  Spoke with MD Donata Clay, received verbal order to modify TF orders per recommendations above with orders to begin @ rate of 40 ml/hr and slowly advance to goal  Monitor magnesium, potassium, and phosphorus daily for at least 3 days, MD to replete as needed, as pt is at risk for refeeding syndrome given chronic malnutrition with poor po intake.   NUTRITION DIAGNOSIS:   Moderate Malnutrition related to chronic illness(CHF) as evidenced by mild fat depletion, severe fat depletion.  Being addressed via TF  GOAL:   Provide needs based on ASPEN/SCCM guidelines  Progressing  MONITOR:   TF tolerance, Weight trends, Vent status, Skin, Labs  REASON FOR ASSESSMENT:   Malnutrition Screening Tool    ASSESSMENT:   67 yo male admitted with acute respiratory failure with RLL pneumonia as well as acute on chronic CHF, AKI. Pt with hx of HTN, CHF  1/30 Placement of Impella 5.0 LVAD  Patient is currently intubated on ventilator support post-op Levophed, milrinone MV: 6.5 L/min Temp (24hrs), Avg:99.6 F (37.6 C), Min:98.8 F (37.1 C), Max:100.2 F (37.9 C)  Cortrak to be placed today Noted Vital 1.5 TF ordered @ 40 ml/hr by MD Donata Clay today  Labs: sodium 124, Creatnine 2.40 Meds: lasix, precedex, milrinone, levophed  Diet Order:  No diet orders on file  EDUCATION NEEDS:   Not appropriate for education at this time(Needs education on follow-up)  Skin:  Skin Assessment: Reviewed RN Assessment  Last BM:  1/24  Height:   Ht Readings from Last 1 Encounters:  10/01/17 5\' 10"  (1.778 m)    Weight:   Wt Readings from Last 1 Encounters:  10/10/2017 183 lb 13.8 oz (83.4 kg)    Ideal Body Weight:  75.5 kg  BMI:  Body  mass index is 26.38 kg/m.  Estimated Nutritional Needs:   Kcal:  1850  Protein:  112-144 g  Fluid:  </= 1.8 L   Romelle Starcher MS, RD, LDN, CNSC [phone] Pager  [phone] Weekend/On-Call Pager

## 2017-10-09 NOTE — Progress Notes (Signed)
  CI up some to 1.9 with Impella in place.  Fever to 101.  PCT up to 6.  I think we need to broaden abx, has been on ceftriaxone > 1 week.  Will order meropenem.  ?vancomycin but given AKI will discuss with CCM first.   Marca Ancona 09/15/2017 5:07 PM

## 2017-10-09 NOTE — Progress Notes (Signed)
Patient ID: Bryan Harmon, male   DOB: [birth year], 67 y.o.   MRN: [id number]     Advanced Heart Failure Rounding Note  PCP:  Primary Cardiologist: Bryan Harmon   Subjective:    Impella 5.0 placed in the OR today.  Remains intubated post-op.  Currently on norepinephrine 40, milrinone 0.375, amiodarone 30 mg/hr.   Impella 5.0  P9 Flow 4.8 L/min  Swan: CVP 16-18 PA 46/26 CI 1.7  Respiratory virus screen: Parainfluenza virus.  Bld CX- Strep pneumoniae--> IV rocephin.  CXR with persistent RLL PNA.  CT chest with multifocal PNA 1/29.    Echo: Severe LV dilation, EF 15-20%, severely dilated RV with severely decreased RV systolic function, moderate-severe Bryan, moderate-severe TR, cannot rule out LV apical thrombus.  Repeat echo with Definity showed definite LV apical thrombus.   TEE (1/28): EF 10-15%, diffuse hypokinesis, small LV apical thrombus, mildly dilated RV with moderately decreased systolic function, no LAA thrombus.    [facility] Procedural Findings (1/29): Hemodynamics (mmHg) RA mean 11 RV 37/11 PA 41/22, mean 31 PCWP mean 24 Oxygen saturations: PA 40% AO 92% Cardiac Output (Fick) 3.1  Cardiac Index (Fick) 1.54 Cardiac Output (Thermo) 3.25 Cardiac Index (Thermo) 1.62  PVR 2.15 WU  Objective:   Weight Range: 183 lb 13.8 oz (83.4 kg) Body mass index is 26.38 kg/m.   Vital Signs:   Temp:  [98.8 F (37.1 C)-100.2 F (37.9 C)] 99.3 F (37.4 C) (01/30 1300) Pulse Rate:  [42-127] 57 (01/30 1245) Resp:  [9-35] 19 (01/30 1300) BP: (82-141)/(58-98) 101/83 (01/30 1300) SpO2:  [75 %-100 %] 75 % (01/30 1245) FiO2 (%):  [50 %] 50 % (01/30 1159) Weight:  [183 lb 13.8 oz (83.4 kg)] 183 lb 13.8 oz (83.4 kg) (01/30 0500) Last BM Date: 10/03/17  Weight change: Filed Weights   10/18/2017 0455 09/23/2017 0500 10/02/2017 0500  Weight: 183 lb 3.2 oz (83.1 kg) 179 lb 7.3 oz (81.4 kg) 183 lb 13.8 oz (83.4 kg)    Intake/Output:   Intake/Output Summary (Last 24 hours) at 09/29/2017  1346 Last data filed at 10/02/2017 1320 Gross per 24 hour  Intake 3073.45 ml  Output 1559 ml  Net 1514.45 ml      Physical Exam  CVP 16  General: Intubated/sedated Neck: JVP 14 cm, no thyromegaly or thyroid nodule.  Lungs: Decreased breath sounds at bases CV: Lateral PMI.  Heart irregular S1/S2, no S3/S4, 2/6 HSM apex.  1+ edema 1/2 to knees bilaterally Abdomen: Soft, no hepatosplenomegaly, no distention.  Skin: Intact without lesions or rashes.  Neurologic: Sedated Extremities: No clubbing or cyanosis.  HEENT: Normal.    Telemetry   Afib 110s, personally reviewed.   EKG    No new tracings.    Labs    CBC Recent Labs    09/21/2017 0411 09/29/2017 0430 09/15/2017 0836  WBC 7.3 8.6  --   NEUTROABS 6.7 7.8*  --   HGB 12.2* 12.4* 12.9*  HCT 35.5* 36.0* 38.0*  MCV 92.7 91.8  --   PLT 207 163  --    Basic Metabolic Panel Recent Labs    40/98/11 0448  10/02/2017 1439 10/08/2017 0430 09/13/2017 0836  NA 125*   < > 125* 125* 124*  K 2.9*   < > 3.9 4.2 4.1  CL 77*   < > 79* 81* 80*  CO2 32   < > 29 29  --   GLUCOSE 108*   < > 150* 142* 158*  BUN 51*   < >  52* 54* 47*  CREATININE 2.20*   < > 2.33* 2.38* 2.40*  CALCIUM 7.9*   < > 8.2* 8.0*  --   MG  --   --   --  2.1  --   PHOS 3.4  --   --  2.9  --    < > = values in this interval not displayed.   Liver Function Tests Recent Labs    09/20/2017 0448 09/19/2017 0005  AST  --  112*  ALT  --  239*  ALKPHOS  --  137*  BILITOT  --  0.8  PROT  --  5.7*  ALBUMIN 2.3* 2.2*   No results for input(s): LIPASE, AMYLASE in the last 72 hours. Cardiac Enzymes No results for input(s): CKTOTAL, CKMB, CKMBINDEX, TROPONINI in the last 72 hours.  BNP: BNP (last 3 results) Recent Labs    12/13/16 0949 02/11/17 1049 10/02/2017 1218  BNP 369.2* 562.7* 2,485.9*    ProBNP (last 3 results) No results for input(s): PROBNP in the last 8760 hours.   D-Dimer No results for input(s): DDIMER in the last 72 hours. Hemoglobin A1C No  results for input(s): HGBA1C in the last 72 hours. Fasting Lipid Panel No results for input(s): CHOL, HDL, LDLCALC, TRIG, CHOLHDL, LDLDIRECT in the last 72 hours. Thyroid Function Tests No results for input(s): TSH, T4TOTAL, T3FREE, THYROIDAB in the last 72 hours.  Invalid input(s): FREET3  Other results:   Imaging    Dg Chest Portable 1 View  Result Date: 09/21/2017 CLINICAL DATA:  Patient status post placement of an Impella left ventricular assist device today. EXAM: PORTABLE CHEST 1 VIEW COMPARISON:  CT chest 09/23/2017. Single-view of the chest 10/07/2016. FINDINGS: Left ventricular assist device from a right IJ approach is in place. Tip of the device projects near the apex of the left ventricle. Right IJ approach Swan-Ganz catheter is in the distal right main pulmonary artery. ET tube is in good position with the tip at the level of the clavicular heads. There is no pneumothorax. Multifocal bilateral airspace disease is worst in the right base. There is massive cardiomegaly. IMPRESSION: Left ventricular assist device in place.  Negative for pneumothorax. Swan-Ganz catheter tip projects in the distal right main pulmonary artery. ET tube is in good position with tip at the level of clavicular heads. Right worse than left airspace disease compatible with multifocal pneumonia seen on CT scan. Massive cardiomegaly. Electronically Signed   By: Bryan Harmon M.D.   On: 10/03/2017 11:13   Dg C-arm 1-60 Min-no Report  Result Date: 09/15/2017 Fluoroscopy was utilized by the requesting physician.  No radiographic interpretation.     Medications:     Scheduled Medications: . Chlorhexidine Gluconate Cloth  6 each Topical Daily  . mouth rinse  15 mL Mouth Rinse BID  . mupirocin ointment  1 application Nasal BID  . potassium chloride  20 mEq Oral QID  . sodium chloride flush  3 mL Intravenous Q12H  . white petrolatum        Infusions: . sodium chloride Stopped (09/15/2017 1700)  .  amiodarone 30.06 mg/hr (09/20/2017 1200)  . cefTRIAXone (ROCEPHIN)  IV 2 g (09/29/2017 0507)  . dexmedetomidine 0.7 mcg/kg/hr (09/22/2017 1200)  . dextrose 5 % Impella 5.0 Purge solution    . feeding supplement (VITAL 1.5 CAL)    . furosemide 120 mg (10/09/17 0534)  . impella catheter heparin 50 unit/mL in dextrose 5%    . heparin    . milrinone 0.375  mcg/kg/min (Oct 18, 2017 1233)  . norepinephrine (LEVOPHED) Adult infusion      PRN Medications: sodium chloride, acetaminophen, albuterol, ondansetron (ZOFRAN) IV, sodium chloride flush, sodium chloride flush    Patient Profile  Bryan Harmon is a 67 year old with a history of PAF S/P DC-CV 11/2016, s/p bilateral inguinal hernia repair 11/2017, PVCs, HTN, NICM, chronic systolic heart failure.   Sent from Urgent Care with A fib RVR. Acutely SOB on arrival   Assessment/Plan   1. Acute hypoxemic respiratory failure: RLL PNA, Strep pneumoniae in blood cultures. Respiratory cultures with parainfluenzae virus. Also acute/chronic systolic CHF. -Covering withceftriaxone currently, now off doxycycline.  -CVP 16-18. Continue IV diuresis.  2.Acute on chronic systolic CHF: Nonischemic cardiomyopathy. 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 Etiology of his CHF is not clear =>no definite inciting event. He has a history of HTN but doubt this was the only trigger. 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 This would ideally be confirmed by cMRI, but he has not wanted an MRI (concerned about side effects). He also has frequent PVCs, 21% total on last holter in 4/18 which is a risk for fall in EF.No family history of CMP. Cannot rule out viral myocarditis. SPEP negative. With medical management, he initially felt a lot better. However, he quit all his meds in early 2018 with recurrence of NYHA III symptoms and onset of atrial fibrillation.He had TEE-DCCV in 3/18.TEE  showed that EF remained25%. He quit his meds again in 4/18 and apparently did not restart them when I asked him to in 6/18. No meds probably since 4/18. Echo this admission with EF 15-20%, severe RV dysfunction, LV apical thrombus.  Attempted DCCV on 1/28 failed likely due to pressors/inotropes.  RHC 1/29 with low cardiac output despite milrinone 0.5 + norepinephrine 40 => Impella 5.0 placed this morning.  Good flow at 4.8 L/min on P9.  Cardiac index remains low at 1.7.  - Increase milrinone back to 0.5 to support RV and will try to titrate down some on norepinephine with stable BP.  -Filling pressures remains elevated => Continue Lasix 120 mg IV every 8 hrs and will give a dose of tolvaptan with low sodium. - Keep Impella at P9, follow LDH/CBC.  Will assess with echo in am.  3. 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 It is possible that the PVCs contribute to his cardiomyopathy. -Onamiodarone. No change. 4. Atrial fibrillation:Persistent, now with RVR. HR up to 140sinitially, now 110s on amiodarone gtt at30. Not sure how long he has been in atrial fibrillation with marked RVR, but this may have triggered his CHF and AKI due to worsening of cardiac output.   -Restart heparin gtt when ok with Dr. Donata Clay.  -Failed TEE-guided DCCV on 1/28 in setting of vasoactive meds.  If we can wean down milrinone/norepinephrine, will re-attempt DCCV in future.   5. AKI: Creatinine 1.08 in 6/18, he has not been on any meds. Suspect cardiorenal syndrome with afib/RVR and fall in cardiac output.He was initially hyperkalemic and acidotic.Creatinine fairly stable today at 2.4. - Will try to maintain cardiac output and BP to avoid further hemodynamic damage.  6. ZO:XWRUE pneumo PNA as well as parainfluenza virus.CT chest 1/29 with multifocal PNA. Tm 100, WBCs normal.  - Cultures negative so far, PCT still elevated at 6. - Continues Ceftriaxone, ?broaden therapy (will defer to CCM).      7. Elevated LFTs: Suspect shock liver type picture. -  AST/ALT > 1000 at admission but have trended down steadily.  8. LV thrombus: Noted on TTE and TEE, small.  - Restart heparin gtt when ok with surgery.   9. Hyponatremia: Hypervolemic hyponatremia. - Na remains low at 124 this am. Tolvaptan 30 mg po x 1.  10. Malnutrition: tube feeds => placing CorTrack.    CRITICAL CARE Performed by: Marca Ancona  Total critical care time: 35 minutes  Critical care time was exclusive of separately billable procedures and treating other patients.  Critical care was necessary to treat or prevent imminent or life-threatening deterioration.  Critical care was time spent personally by me on the following activities: development of treatment plan with patient and/or surrogate as well as nursing, discussions with consultants, evaluation of patient's response to treatment, examination of patient, obtaining history from patient or surrogate, ordering and performing treatments and interventions, ordering and review of laboratory studies, ordering and review of radiographic studies, pulse oximetry and re-evaluation of patient's condition.  Marca Ancona 09/16/2017 1:46 PM

## 2017-10-09 NOTE — Op Note (Signed)
NAME:  Bryan Harmon, BAIL NO.:  [id number]  MEDICAL RECORD NO.:  [id number]  LOCATION:  2H20C                        FACILITY:  [facility]  PHYSICIAN:  Kerin Perna, M.D.  DATE OF BIRTH:  [birth year]  DATE OF PROCEDURE:  10-13-2017 DATE OF DISCHARGE:                              OPERATIVE REPORT   OPERATION:  Placement of Abiomed Impella 5.0 percutaneous left ventricular assist device, right axillary artery Dacron graft.  SURGEON:  Kerin Perna, MD.  PREOPERATIVE DIAGNOSES:  Cardiogenic shock from acute on chronic systolic heart failure, nonischemic cardiomyopathy.  POSTOPERATIVE DIAGNOSES:  Cardiogenic shock from acute on chronic systolic heart failure, nonischemic cardiomyopathy.  ANESTHESIA:  General.  INDICATIONS:  The patient is a 67 year old diabetic male with known cardiomyopathy, chronic atrial fibrillation, diabetes, and chronic renal disease, followed by Dr. [name] [facility]. He was recently admitted with exacerbation of symptoms associated with viral pneumonia and evidence of cardiogenic shock.  He was treated with inotropes without improvement and temporary percutaneous left ventricular assist device support was recommended.  I examined the patient and reviewed his echocardiogram and right heart cath and CT scan of the chest studies.  I had a frank discussion with the patient who understood that at this point he was not a candidate for implantable ventricular assist device over transplantation because of his viral pneumonia, acute renal failure, and severe RV dysfunction.  I did feel that it was possible that with this temporary assist device he could show improvement to be weaned off and treated with more conventional medical therapies, improved to be a potential transplant candidate or potential implantable LVAD candidate.  I discussed the procedure with the patient and his family including the use of general  anesthesia, the expected postoperative recovery and the potential risks including bleeding, infection, and death.  He demonstrated his understanding and agreed to proceed with surgery under what I felt was an informed consent.  His exact words were "I agree, I am in a survival mode."  DESCRIPTION OF PROCEDURE:  The patient was brought from the preoperative area to the operating room and placed supine on the operating table. General anesthesia was induced under invasive hemodynamic monitoring.  A transesophageal echo probe was placed by the Anesthesia team.  The chest, abdomen, and groins were prepped with Betadine and draped as a sterile field.  A proper time-out was performed.  A small incision was made under the right clavicle.  The fibers of the pectoralis major were split longitudinally.  Retractor was placed.  Above the pectoralis minor, the axillary vessels were identified.  The vein was gently dissected and retracted laterally to expose the artery, which was soft and non-diseased.  This was dissected from the brachial plexus carefully and encircled with vessel.  4000 units of heparin were administered. The 10-mm Gelweave graft was then brought to the field and divided at a proper taper angle.  Two vascular clamps were placed and an arteriotomy was made in the axillary artery.  An end-to-side anastomosis between the graft and the axillary artery was created with a running 5-0 Prolene. This was reinforced with a light layer of Biologic adhesive - Coseal. The graft was clamped and the  2 vascular clamps on the artery were removed.  There was good hemostasis.  Next, the Impella 5.0 catheter was brought to the operative field and prepared and flushed and purged.  First a stopper was placed in the graft and a guidewire passed to the aortic root under C-arm fluoroscopy. Over the guidewire, the catheter was advanced and the catheter passed through the aortic valve.  Next, the first wire  was exchanged for a second wire, which was passed into the LV chamber.  The catheter was then removed leaving the wire in the LV chamber.  The wire in the LV chamber was then back-fed into the Impella VAD catheter, which was then fed into the LV under fluoroscopic and echocardiogram guidance.  The guidewire was removed and the Impella catheter was activated to performance level of 2, 4, 6 and then 8. There were good LVAD flows.  Next, the introducer sheath was passed carefully over the Impella catheter into the graft and was secured with several heavy silk ties to provide hemostasis.  The catheter for the Impella VAD was then adjusted slightly to optimize hemodynamics and flow.  Echo and C-arm showed the Impella to be in a good position across the aortic valve at approximately 54 cm from the end of the graft.  Next, the incision was irrigated with vancomycin irrigation, closed in layers using Vicryl and skin staples.  The introducer sheath was secured to the right arm with interrupted silk sutures and sterile dressing was applied.  A chest x-ray taken in the operating room showed the assist device to be in good position without pneumothorax.  The patient was then transported back to the ICU still intubated in hemodynamically stable condition.     Kerin Perna, M.D.     PV/MEDQ  D:  09/21/2017  T:  09/14/2017  Job:  [id number]

## 2017-10-09 NOTE — Progress Notes (Addendum)
Pharmacy Antibiotic Note  Bryan Harmon is a 67 y.o. male admitted on 10-06-17 with pneumonia.  Pharmacy has been consulted for Meropenem dosing.  Pt is currently on d#10 Rocephin for strep pna bacteremia, now with new fever (T101) and elevated PCT.  Asked by MD to change abx to Meropenem and Vancomycin.  Noted AKI with SCr 2.9.  Plan: Meropenem 1gm IV q12h Received Vancomycin 1250mg  IV x 1 this AM pre-op Will start Vancomycin 1gm IV q24h at 8pm. Follow-up repeat cx data, clinical progress, renal function. Vanc trough as indicated, goal 15-20 mcg/ml  Height: 5\' 10"  (177.8 cm) Weight: 183 lb 13.8 oz (83.4 kg) IBW/kg (Calculated) : 73  Temp (24hrs), Avg:99.8 F (37.7 C), Min:98.8 F (37.1 C), Max:101.3 F (38.5 C)  Recent Labs  Lab 10/05/17 0430  10/06/17 0511  09/18/2017 1111  09/10/2017 0411 09/15/2017 1439 09/12/2017 0430 09/18/2017 0836 10/03/2017 1535  WBC 9.0  --  6.0  --  6.4  --  7.3  --  8.6  --   --   CREATININE 2.22*   < > 2.28*   < >  --    < > 2.20* 2.33* 2.38* 2.40* 2.90*   < > = values in this interval not displayed.    Estimated Creatinine Clearance: 26.2 mL/min (A) (by C-G formula based on SCr of 2.9 mg/dL (H)).    No Known Allergies  Antimicrobials this admission: 1/21 doxy> 1/23 1/21 rocephin > 1/30 1/30 merrem >  Dose adjustments this admission:   Microbiology results: Strep pna + urine antigen 1/21 Bcx NGTD 1/21 flu neg 1/21 Bcx strep pneumo 1/21 resp screen- Parainfluenza Virus 3 Thank you for allowing pharmacy to be a part of this patient's care.  [employer], Pharm.D., BCPS Clinical Pharmacist After 4pm, please call Main Rx [phone]) for assistance. 10/08/2017 5:20 PM

## 2017-10-09 NOTE — Anesthesia Procedure Notes (Signed)
Arterial Line Insertion Start/End01/06/2018 7:05 AM, 10/09/2017 7:10 AM Performed by: Rosalio Macadamia, CRNA, CRNA  Patient location: Pre-op. Preanesthetic checklist: patient identified, IV checked, site marked, risks and benefits discussed, surgical consent, monitors and equipment checked, pre-op evaluation, timeout performed and anesthesia consent Left, radial was placed Catheter size: 20 G Hand hygiene performed  and maximum sterile barriers used  Allen's test indicative of satisfactory collateral circulation Attempts: 1 Procedure performed without using ultrasound guided technique. Following insertion, dressing applied and Biopatch. Post procedure assessment: normal  Patient tolerated the procedure well with no immediate complications.

## 2017-10-09 NOTE — Progress Notes (Signed)
67 year old with ischemic cardiomyopathy, MR, paroxysmal atrial fibrillation admitted with fatigue, rapid atrial fibrillation, cardiogenic shock, Streptococcus bacteremia, presumed pneumonia, parainfluenza infection  He underwent Impala placement today. He came back on high-dose levo fed which is being tapered down. He was febrile with mild leukocytosis and high pro-calcitonin.  He has received 10 days of antibiotics for strep pneumonia bacteremia but infiltrates persist although oxygenation is improved Antibiotics have been broadened to meropenem and vancomycin for H CAP  He continues on amiodarone milrinone drip which is being titrated per co-ox Ventilator weaning is being deferred due to cardiogenic shock  Rhaelyn Giron V. Vassie Loll MD

## 2017-10-09 NOTE — Progress Notes (Signed)
Pre Procedure note for inpatients:   Bryan Harmon has been scheduled for Procedure(s): PLACEMENT OF IMPELLA 5.0 LEFT VENTRICULAR ASSIST DEVICE (N/A) TRANSESOPHAGEAL ECHOCARDIOGRAM (TEE) (N/A) today. The various methods of treatment have been discussed with the patient. After consideration of the risks, benefits and treatment options the patient has consented to the planned procedure.   The patient has been seen and labs reviewed. There are no changes in the patient's condition to prevent proceeding with the planned procedure today.  Recent labs:  Lab Results  Component Value Date   WBC 8.6 10/04/2017   HGB 12.4 (L) 09/13/2017   HCT 36.0 (L) 09/18/2017   PLT 163 09/22/2017   GLUCOSE 142 (H) 09/10/2017   CHOL 178 03/25/2012   TRIG 61 03/25/2012   HDL 69 03/25/2012   LDLCALC 97 03/25/2012   ALT 239 (H) 09/24/2017   AST 112 (H) 09/10/2017   NA 125 (L) 09/26/2017   K 4.2 10/10/2017   CL 81 (L) 09/20/2017   CREATININE 2.38 (H) 10/02/2017   BUN 54 (H) 09/10/2017   CO2 29 09/23/2017   TSH 2.407 09/26/2017   PSA 0.52 03/25/2012   INR 1.33 09/27/2017    Kerin Perna III, MD 10/07/2017 7:05 AM

## 2017-10-09 NOTE — Anesthesia Procedure Notes (Signed)
Central Venous Catheter Insertion Performed by: Cecile Hearing, MD, anesthesiologist Start/End02-11-2017 7:00 AM, 13-Oct-2017 7:13 AM Patient location: Pre-op. Preanesthetic checklist: patient identified, IV checked, site marked, risks and benefits discussed, surgical consent, monitors and equipment checked, pre-op evaluation, timeout performed and anesthesia consent Position: Trendelenburg Lidocaine 1% used for infiltration and patient sedated Hand hygiene performed , maximum sterile barriers used  and Seldinger technique used Catheter size: 8 Fr Total catheter length 16. Central line was placed.Double lumen Procedure performed using ultrasound guided technique. Ultrasound Notes:anatomy identified, needle tip was noted to be adjacent to the nerve/plexus identified, no ultrasound evidence of intravascular and/or intraneural injection and image(s) printed for medical record Attempts: 1 Following insertion, dressing applied, line sutured and Biopatch. Post procedure assessment: blood return through all ports  Patient tolerated the procedure well with no immediate complications.

## 2017-10-09 NOTE — Progress Notes (Signed)
CT surgery p.m. Rounds  Patient resting comfortably on ventilator LVAD flow 4.8 L/m Tube feeds to find nutrition have started Adequate urine output Would recommend leaving patient on ventilator because of probable right lower lobe pneumonia

## 2017-10-09 NOTE — Addendum Note (Signed)
Addendum  created 09/28/2017 1338 by Margarita Rana, CRNA   Child order released for a procedure order, Intraprocedure Blocks edited, Intraprocedure Event edited, LDA created via procedure documentation, Sign clinical note

## 2017-10-09 NOTE — Brief Op Note (Signed)
10/05/2017  10:44 AM  PATIENT:  Bryan Harmon  67 y.o. male  PRE-OPERATIVE DIAGNOSIS:  ICM  POST-OPERATIVE DIAGNOSIS:  ICM  PROCEDURE:  Procedure(s): PLACEMENT OF IMPELLA 5.0 LEFT VENTRICULAR ASSIST DEVICE (N/A) TRANSESOPHAGEAL ECHOCARDIOGRAM (TEE) (N/A)  SURGEON:  Surgeon(s) and Role:    Kerin Perna, MD - Primary  PHYSICIAN ASSISTANT: none  ASSISTANTS: TOW RNFA   ANESTHESIA:   general  EBL:  100 cc  BLOOD ADMINISTERED:none  DRAINS: none   LOCAL MEDICATIONS USED:  NONE  SPECIMEN:  No Specimen  DISPOSITION OF SPECIMEN:  N/A  COUNTS:  YES  TOURNIQUET:  * No tourniquets in log *  DICTATION: .Dragon Dictation  PLAN OF CARE: return to ICU room  PATIENT DISPOSITION:  ICU - intubated and hemodynamically stable.   Delay start of Pharmacological VTE agent (>24hrs) due to surgical blood loss or risk of bleeding: yes

## 2017-10-09 NOTE — Transfer of Care (Signed)
Immediate Anesthesia Transfer of Care Note  Patient: Bryan Harmon  Procedure(s) Performed: PLACEMENT OF IMPELLA 5.0 LEFT VENTRICULAR ASSIST DEVICE (N/A Chest) TRANSESOPHAGEAL ECHOCARDIOGRAM (TEE) (N/A )  Patient Location: ICU  Anesthesia Type:General  Level of Consciousness: Patient remains intubated per anesthesia plan  Airway & Oxygen Therapy: Patient remains intubated per anesthesia plan and Patient placed on Ventilator (see vital sign flow sheet for setting)  Post-op Assessment: Report given to RN and Post -op Vital signs reviewed and stable  Post vital signs: Reviewed and stable  Last Vitals:  Vitals:   09/17/2017 0530 09/29/2017 0600  BP:  (!) 117/91  Pulse: (!) 118 (!) 48  Resp: (!) 23 (!) 21  Temp: 37.5 C 37.5 C  SpO2: 91% 93%    Last Pain:  Vitals:   09/22/2017 2000  TempSrc:   PainSc: 0-No pain      Patients Stated Pain Goal: 0 (10/05/17 2151)  Complications: No apparent anesthesia complications

## 2017-10-09 NOTE — Progress Notes (Signed)
  Echocardiogram Echocardiogram Transesophageal has been performed.  Bryan Harmon Bryan Harmon 09/26/2017, 8:57 AM

## 2017-10-10 ENCOUNTER — Inpatient Hospital Stay (HOSPITAL_COMMUNITY): Payer: Medicare Other

## 2017-10-10 DIAGNOSIS — I509 Heart failure, unspecified: Secondary | ICD-10-CM

## 2017-10-10 DIAGNOSIS — E440 Moderate protein-calorie malnutrition: Secondary | ICD-10-CM

## 2017-10-10 DIAGNOSIS — R570 Cardiogenic shock: Secondary | ICD-10-CM

## 2017-10-10 DIAGNOSIS — Z95811 Presence of heart assist device: Secondary | ICD-10-CM

## 2017-10-10 DIAGNOSIS — M7989 Other specified soft tissue disorders: Secondary | ICD-10-CM

## 2017-10-10 DIAGNOSIS — I5023 Acute on chronic systolic (congestive) heart failure: Secondary | ICD-10-CM

## 2017-10-10 LAB — COMPREHENSIVE METABOLIC PANEL
ALT: 176 U/L — ABNORMAL HIGH (ref 17–63)
AST: 103 U/L — ABNORMAL HIGH (ref 15–41)
Albumin: 2 g/dL — ABNORMAL LOW (ref 3.5–5.0)
Alkaline Phosphatase: 111 U/L (ref 38–126)
Anion gap: 12 (ref 5–15)
BUN: 66 mg/dL — ABNORMAL HIGH (ref 6–20)
CO2: 27 mmol/L (ref 22–32)
Calcium: 7.8 mg/dL — ABNORMAL LOW (ref 8.9–10.3)
Chloride: 87 mmol/L — ABNORMAL LOW (ref 101–111)
Creatinine, Ser: 2.84 mg/dL — ABNORMAL HIGH (ref 0.61–1.24)
GFR calc Af Amer: 25 mL/min — ABNORMAL LOW (ref >60)
GFR calc non Af Amer: 22 mL/min — ABNORMAL LOW (ref >60)
Glucose, Bld: 110 mg/dL — ABNORMAL HIGH (ref 65–99)
Potassium: 4.4 mmol/L (ref 3.5–5.1)
Sodium: 126 mmol/L — ABNORMAL LOW (ref 135–145)
Total Bilirubin: 1.2 mg/dL (ref 0.3–1.2)
Total Protein: 5.4 g/dL — ABNORMAL LOW (ref 6.5–8.1)

## 2017-10-10 LAB — BASIC METABOLIC PANEL
ANION GAP: 12 (ref 5–15)
BUN: 67 mg/dL — AB (ref 6–20)
CHLORIDE: 89 mmol/L — AB (ref 101–111)
CO2: 29 mmol/L (ref 22–32)
Calcium: 7.6 mg/dL — ABNORMAL LOW (ref 8.9–10.3)
Creatinine, Ser: 2.76 mg/dL — ABNORMAL HIGH (ref 0.61–1.24)
GFR calc Af Amer: 26 mL/min — ABNORMAL LOW (ref >60)
GFR calc non Af Amer: 23 mL/min — ABNORMAL LOW (ref >60)
GLUCOSE: 126 mg/dL — AB (ref 65–99)
POTASSIUM: 4.5 mmol/L (ref 3.5–5.1)
Sodium: 130 mmol/L — ABNORMAL LOW (ref 135–145)

## 2017-10-10 LAB — CBC WITH DIFFERENTIAL/PLATELET
Basophils Absolute: 0.1 10*3/uL (ref 0.0–0.1)
Basophils Relative: 1 %
Eosinophils Absolute: 0 10*3/uL (ref 0.0–0.7)
Eosinophils Relative: 0 %
HCT: 34.1 % — ABNORMAL LOW (ref 39.0–52.0)
Hemoglobin: 11.5 g/dL — ABNORMAL LOW (ref 13.0–17.0)
Lymphocytes Relative: 10 %
Lymphs Abs: 1.1 10*3/uL (ref 0.7–4.0)
MCH: 30.7 pg (ref 26.0–34.0)
MCHC: 33.7 g/dL (ref 30.0–36.0)
MCV: 90.9 fL (ref 78.0–100.0)
Monocytes Absolute: 0.3 10*3/uL (ref 0.1–1.0)
Monocytes Relative: 3 %
Neutro Abs: 9.5 10*3/uL — ABNORMAL HIGH (ref 1.7–7.7)
Neutrophils Relative %: 86 %
Platelets: 113 10*3/uL — ABNORMAL LOW (ref 150–400)
RBC: 3.75 MIL/uL — ABNORMAL LOW (ref 4.22–5.81)
RDW: 13.8 % (ref 11.5–15.5)
WBC: 11 10*3/uL — ABNORMAL HIGH (ref 4.0–10.5)

## 2017-10-10 LAB — POCT ACTIVATED CLOTTING TIME
ACTIVATED CLOTTING TIME: 131 s
ACTIVATED CLOTTING TIME: 136 s
ACTIVATED CLOTTING TIME: 142 s
ACTIVATED CLOTTING TIME: 147 s
ACTIVATED CLOTTING TIME: 153 s
Activated Clotting Time: 131 seconds
Activated Clotting Time: 125 seconds
Activated Clotting Time: 120 seconds
Activated Clotting Time: 120 seconds
Activated Clotting Time: 131 seconds
Activated Clotting Time: 120 seconds
Activated Clotting Time: 147 seconds

## 2017-10-10 LAB — GLUCOSE, CAPILLARY
GLUCOSE-CAPILLARY: 110 mg/dL — AB (ref 65–99)
GLUCOSE-CAPILLARY: 119 mg/dL — AB (ref 65–99)
GLUCOSE-CAPILLARY: 143 mg/dL — AB (ref 65–99)
GLUCOSE-CAPILLARY: 144 mg/dL — AB (ref 65–99)
Glucose-Capillary: 100 mg/dL — ABNORMAL HIGH (ref 65–99)
Glucose-Capillary: 52 mg/dL — ABNORMAL LOW (ref 65–99)
Glucose-Capillary: 138 mg/dL — ABNORMAL HIGH (ref 65–99)
Glucose-Capillary: 117 mg/dL — ABNORMAL HIGH (ref 65–99)

## 2017-10-10 LAB — POCT I-STAT 3, ART BLOOD GAS (G3+)
ACID-BASE EXCESS: 11 mmol/L — AB (ref 0.0–2.0)
Bicarbonate: 35.6 mmol/L — ABNORMAL HIGH (ref 20.0–28.0)
O2 SAT: 99 %
PH ART: 7.492 — AB (ref 7.350–7.450)
TCO2: 37 mmol/L — ABNORMAL HIGH (ref 22–32)
pCO2 arterial: 47.1 mmHg (ref 32.0–48.0)
pO2, Arterial: 152 mmHg — ABNORMAL HIGH (ref 83.0–108.0)

## 2017-10-10 LAB — BLOOD GAS, ARTERIAL
Acid-Base Excess: 7.6 mmol/L — ABNORMAL HIGH (ref 0.0–2.0)
Bicarbonate: 31.5 mmol/L — ABNORMAL HIGH (ref 20.0–28.0)
Drawn by: 414221
FIO2: 50
MECHVT: 580 mL
O2 Saturation: 99.2 %
PEEP: 5 cmH2O
Patient temperature: 98.6
RATE: 12 resp/min
pCO2 arterial: 43.9 mmHg (ref 32.0–48.0)
pH, Arterial: 7.47 — ABNORMAL HIGH (ref 7.350–7.450)
pO2, Arterial: 171 mmHg — ABNORMAL HIGH (ref 83.0–108.0)

## 2017-10-10 LAB — MRSA CULTURE: CULTURE: NOT DETECTED

## 2017-10-10 LAB — COOXEMETRY PANEL
Carboxyhemoglobin: 1 % (ref 0.5–1.5)
Methemoglobin: 1.3 % (ref 0.0–1.5)
O2 Saturation: 67 %
Total hemoglobin: 10 g/dL — ABNORMAL LOW (ref 12.0–16.0)

## 2017-10-10 LAB — APTT: aPTT: 37 seconds — ABNORMAL HIGH (ref 24–36)

## 2017-10-10 LAB — ECHOCARDIOGRAM LIMITED
HEIGHTINCHES: 70 in
WEIGHTICAEL: 3044.11 [oz_av]

## 2017-10-10 LAB — MAGNESIUM
Magnesium: 2.2 mg/dL (ref 1.7–2.4)
Magnesium: 2.1 mg/dL (ref 1.7–2.4)

## 2017-10-10 LAB — PROCALCITONIN: Procalcitonin: 8.49 ng/mL

## 2017-10-10 LAB — HEPARIN LEVEL (UNFRACTIONATED): Heparin Unfractionated: <0.1 IU/mL — ABNORMAL LOW (ref 0.30–0.70)

## 2017-10-10 LAB — PHOSPHORUS
Phosphorus: 3.5 mg/dL (ref 2.5–4.6)
Phosphorus: 3 mg/dL (ref 2.5–4.6)

## 2017-10-10 LAB — LACTATE DEHYDROGENASE: LDH: 517 U/L — ABNORMAL HIGH (ref 98–192)

## 2017-10-10 LAB — CORTISOL-AM, BLOOD: Cortisol - AM: 20.3 ug/dL (ref 6.7–22.6)

## 2017-10-10 MED ORDER — ACETAMINOPHEN 10 MG/ML IV SOLN
1000.0000 mg | Freq: Once | INTRAVENOUS | Status: AC
  Administered 2017-10-10: 1000 mg via INTRAVENOUS
  Filled 2017-10-10: qty 100

## 2017-10-10 MED ORDER — DEXTROSE 50 % IV SOLN
INTRAVENOUS | Status: AC
  Administered 2017-10-10: 50 mL via INTRAVENOUS
  Filled 2017-10-10: qty 50

## 2017-10-10 MED ORDER — FAMOTIDINE IN NACL 20-0.9 MG/50ML-% IV SOLN
20.0000 mg | Freq: Two times a day (BID) | INTRAVENOUS | Status: DC
  Filled 2017-10-10: qty 50

## 2017-10-10 MED ORDER — MIDAZOLAM HCL 2 MG/2ML IJ SOLN
2.0000 mg | INTRAMUSCULAR | Status: AC | PRN
  Administered 2017-10-11 (×3): 2 mg via INTRAVENOUS
  Filled 2017-10-10 (×2): qty 2

## 2017-10-10 MED ORDER — FENTANYL CITRATE (PF) 100 MCG/2ML IJ SOLN
100.0000 ug | INTRAMUSCULAR | Status: DC | PRN
  Administered 2017-10-11 – 2017-10-17 (×23): 100 ug via INTRAVENOUS
  Filled 2017-10-10 (×18): qty 2

## 2017-10-10 MED ORDER — FENTANYL CITRATE (PF) 100 MCG/2ML IJ SOLN
100.0000 ug | INTRAMUSCULAR | Status: DC | PRN
  Administered 2017-10-13 – 2017-10-15 (×2): 100 ug via INTRAVENOUS
  Filled 2017-10-10 (×9): qty 2

## 2017-10-10 MED ORDER — MIDAZOLAM HCL 2 MG/2ML IJ SOLN
2.0000 mg | INTRAMUSCULAR | Status: DC | PRN
  Administered 2017-10-10 – 2017-10-25 (×42): 2 mg via INTRAVENOUS
  Filled 2017-10-10 (×45): qty 2

## 2017-10-10 MED ORDER — MAGIC MOUTHWASH
5.0000 mL | Freq: Four times a day (QID) | ORAL | Status: DC | PRN
  Administered 2017-10-10 – 2017-10-19 (×10): 5 mL via ORAL
  Filled 2017-10-10 (×11): qty 5

## 2017-10-10 MED ORDER — ORAL CARE MOUTH RINSE
15.0000 mL | OROMUCOSAL | Status: DC
  Administered 2017-10-10 – 2017-10-27 (×173): 15 mL via OROMUCOSAL

## 2017-10-10 MED ORDER — FAMOTIDINE IN NACL 20-0.9 MG/50ML-% IV SOLN
20.0000 mg | INTRAVENOUS | Status: DC
  Administered 2017-10-10 – 2017-10-24 (×15): 20 mg via INTRAVENOUS
  Filled 2017-10-10 (×16): qty 50

## 2017-10-10 MED ORDER — HEPARIN BOLUS VIA INFUSION
500.0000 [IU] | Freq: Once | INTRAVENOUS | Status: AC
  Administered 2017-10-10: 500 [IU] via INTRAVENOUS

## 2017-10-10 MED ORDER — DEXMEDETOMIDINE HCL IN NACL 200 MCG/50ML IV SOLN
0.0000 ug/kg/h | INTRAVENOUS | Status: DC
  Administered 2017-10-10 (×3): 0.7 ug/kg/h via INTRAVENOUS
  Administered 2017-10-11: 0.9 ug/kg/h via INTRAVENOUS
  Administered 2017-10-11: 0.7 ug/kg/h via INTRAVENOUS
  Administered 2017-10-11: 1 ug/kg/h via INTRAVENOUS
  Administered 2017-10-11: 0.7 ug/kg/h via INTRAVENOUS
  Administered 2017-10-11 (×2): 1.2 ug/kg/h via INTRAVENOUS
  Filled 2017-10-10 (×3): qty 50
  Filled 2017-10-10 (×2): qty 100
  Filled 2017-10-10 (×4): qty 50

## 2017-10-10 MED ORDER — ALBUMIN HUMAN 5 % IV SOLN
12.5000 g | Freq: Once | INTRAVENOUS | Status: AC
  Administered 2017-10-10: 12.5 g via INTRAVENOUS

## 2017-10-10 MED ORDER — FUROSEMIDE 10 MG/ML IJ SOLN
6.0000 mg/h | INTRAVENOUS | Status: DC
  Administered 2017-10-10: 12 mg/h via INTRAVENOUS
  Administered 2017-10-11 – 2017-10-13 (×2): 6 mg/h via INTRAVENOUS
  Filled 2017-10-10: qty 25
  Filled 2017-10-10: qty 20
  Filled 2017-10-10 (×3): qty 25

## 2017-10-10 MED ORDER — DEXTROSE 50 % IV SOLN
50.0000 mL | Freq: Once | INTRAVENOUS | Status: AC
  Administered 2017-10-10: 50 mL via INTRAVENOUS

## 2017-10-10 MED ORDER — ALBUMIN HUMAN 5 % IV SOLN
INTRAVENOUS | Status: AC
  Administered 2017-10-10: 12.5 g via INTRAVENOUS
  Filled 2017-10-10: qty 250

## 2017-10-10 MED ORDER — LIDOCAINE HCL (PF) 1 % IJ SOLN
INTRAMUSCULAR | Status: AC
  Administered 2017-10-10: 12:00:00
  Filled 2017-10-10: qty 30

## 2017-10-10 MED ORDER — METOLAZONE 2.5 MG PO TABS
2.5000 mg | ORAL_TABLET | Freq: Once | ORAL | Status: AC
  Administered 2017-10-10: 2.5 mg
  Filled 2017-10-10: qty 1

## 2017-10-10 MED ORDER — CHLORHEXIDINE GLUCONATE 0.12% ORAL RINSE (MEDLINE KIT)
15.0000 mL | Freq: Two times a day (BID) | OROMUCOSAL | Status: DC
  Administered 2017-10-10 – 2017-10-27 (×35): 15 mL via OROMUCOSAL

## 2017-10-10 MED ORDER — ALBUMIN HUMAN 5 % IV SOLN
INTRAVENOUS | Status: AC
  Filled 2017-10-10: qty 250

## 2017-10-10 MED ORDER — SODIUM CHLORIDE 0.9 % IV SOLN
250.0000 mL | INTRAVENOUS | Status: DC | PRN
  Administered 2017-10-12 – 2017-10-14 (×3): 250 mL via INTRAVENOUS
  Administered 2017-10-15: 06:00:00 via INTRAVENOUS
  Administered 2017-10-19: 40 mL via INTRAVENOUS
  Administered 2017-10-25: 250 mL via INTRAVENOUS

## 2017-10-10 MED ORDER — SILVER SULFADIAZINE 1 % EX CREA
TOPICAL_CREAM | Freq: Two times a day (BID) | CUTANEOUS | Status: DC
  Filled 2017-10-10: qty 85

## 2017-10-10 NOTE — Progress Notes (Addendum)
[place] for heparin while on Impella 5.0 Indication: Impella 5.0 in setting of LV thrombus and atrial fibrillation  No Known Allergies  Patient Measurements: Height: '5\' 10"'$  (177.8 cm) Weight: 190 lb 4.1 oz (86.3 kg) IBW/kg (Calculated) : 73 Heparin Dosing Weight: 84.1 kg  Vital Signs: Temp: 101.4 F (38.6 C) (01/31 1130) Temp Source: Axillary (01/31 1130) BP: 88/71 (01/31 1000) Pulse Rate: 112 (01/31 1125)  Labs: Recent Labs    10/10/2017 0005  09/14/2017 0411  09/11/2017 0430 09/12/2017 0836 10/10/2017 1158 10/02/2017 1535 10/10/17 0250  HGB  --    < > 12.2*  --  12.4* 12.9*  --  12.6* 11.5*  HCT  --    < > 35.5*  --  36.0* 38.0*  --  37.0* 34.1*  PLT  --   --  207  --  163  --   --   --  113*  APTT  --   --   --   --   --   --   --   --  37*  LABPROT 16.4*  --   --   --   --   --   --   --   --   INR 1.33  --   --   --   --   --   --   --   --   HEPARINUNFRC  --   --  0.43  --   --   --  0.10*  --  <0.10*  CREATININE 2.31*  --  2.20*   < > 2.38* 2.40*  --  2.90* 2.84*   < > = values in this interval not displayed.    Estimated Creatinine Clearance: 26.8 mL/min (A) (by C-G formula based on SCr of 2.84 mg/dL (H)).   Medical History: Past Medical History:  Diagnosis Date  . Hypertension   . Inguinal hernia, bilateral     Medications:  Scheduled:  . chlorhexidine gluconate (MEDLINE KIT)  15 mL Mouth Rinse BID  . Chlorhexidine Gluconate Cloth  6 each Topical Daily  . feeding supplement (PRO-STAT SUGAR FREE 64)  30 mL Per Tube Daily  . insulin aspart  2-6 Units Subcutaneous Q4H  . lidocaine (PF)      . mouth rinse  15 mL Mouth Rinse 10 times per day  . mupirocin ointment  1 application Nasal BID  . potassium chloride  20 mEq Oral QID  . silver sulfADIAZINE   Topical BID  . sodium chloride flush  3 mL Intravenous Q12H    Assessment: 66yom with LV apical thrombus on IV heparin. ECHO showing again small LV apical thrombus but no  LA appendage thrombus. He is also in atrial fibrillation.   Impella 5.0 was placed yesterday. Heparin in purge solution was started at 1800 last night, with systemic heparin starting this morning at 0600.   ACT have been as followed this shift: 131 prior to systemic heparin start: purge solution rate: 190 units/hr [3.8 mL/hr]; systemic heparin started at 500 units/hr 120: no bolus administered, rate increased by 100 units/hr  120: bolus administered (250 units x1 d/t wt>70 kg), rate increased by 200 units/hr 131: no bolus administered, rate increased by 100 units/hr. 120: no bolus administered, rate increased by 100 units/hr.  No signs/symptoms of bleeding. Hgb down slightly from 12.6 to 11.5, platelets down to 113 today. Will continue to monitor ACT along with nursing to provide any assistance.  Thanks,  Doylene Canard, PharmD Clinical Pharmacist  Pager: [phone] Clinical Phone for 10/10/2017 until 3:30pm: [phone] If after 3:30pm, please call main pharmacy at [phone] 10/10/2017,1:19 PM

## 2017-10-10 NOTE — Progress Notes (Addendum)
Attempted to re-wire L IJ dual lumen catheter (placed in surgery on 1/30).  Wire passed through the IJ catheter. Short white catheter placed over wire with no blood return.  Sterile drapes, gowns, gloves, and kit changed.  Attempted x1 for fresh L IJ approach stick.  [place] utilized for procedure.  Difficult to visualize the left IJ on [place].  Pressure held at site x15 minutes.  Delayed small hematoma development noted at L IJ site.  L [place] TLC placed per Dr. Nelda Marseille. RN aware of hematoma findings.  Will continue to monitor L IJ.  CXR reviewed for L [place] and in good position, no PTX, ok for use.   Wife updated on procedure and hematoma.   Noe Gens, NP-C [facility] Pgr: [phone] or if no answer [phone] 10/10/2017, 4:02 PM

## 2017-10-10 NOTE — Progress Notes (Signed)
Impella constantly alarming Impella Outflow blocked.  Dr. Donata Clay made aware.  Impella rep called and recommends checking 2D Echo again for placement.  Dr. Donata Clay made aware and will call fellow.

## 2017-10-10 NOTE — Progress Notes (Signed)
PCXR results called to RN by Radiologist.  Results given to Dr. Kendrick Fries.  Will change out Lt. IJ Central line

## 2017-10-10 NOTE — Progress Notes (Signed)
1 Day Post-Op Procedure(s) (LRB): PLACEMENT OF IMPELLA 5.0 LEFT VENTRICULAR ASSIST DEVICE (N/A) TRANSESOPHAGEAL ECHOCARDIOGRAM (TEE) (N/A) Subjective: Sedated on vent LVAD flows 4.9 L/min Cardiac output, Co-ox improved. Norepi dose decreased Echo shows improved RV fx Creat up to 2.8 CXR with mod R pleural effusion- will place chest tube and culture fluid  Objective: Vital signs in last 24 hours: Temp:  [98.8 F (37.1 C)-102 F (38.9 C)] 101.4 F (38.6 C) (01/31 1130) Pulse Rate:  [25-103] 47 (01/31 1100) Cardiac Rhythm: Atrial fibrillation (01/31 0800) Resp:  [11-23] 12 (01/31 1100) BP: (62-141)/(26-110) 88/71 (01/31 1000) SpO2:  [75 %-100 %] 100 % (01/31 1100) Arterial Line BP: (83-112)/(65-88) 92/65 (01/31 1100) FiO2 (%):  [40 %-50 %] 40 % (01/31 0925) Weight:  [190 lb 4.1 oz (86.3 kg)] 190 lb 4.1 oz (86.3 kg) (01/31 0427)  Hemodynamic parameters for last 24 hours: PAP: (32-58)/(11-35) 39/23 CVP:  [11 mmHg-24 mmHg] 16 mmHg CO:  [3 L/min-4.8 L/min] 4.2 L/min CI:  [1.5 L/min/m2-2.4 L/min/m2] 2.1 L/min/m2  Intake/Output from previous day: 01/30 0701 - 01/31 0700 In: 4629.3 [I.V.:3080.3; NG/GT:669; IV Piggyback:786] Out: 2949 [Urine:2749; Blood:200] Intake/Output this shift: Total I/O In: 487.2 [I.V.:239.8; Other:7.4; NG/GT:240] Out: 2150 [Urine:2150]       Exam  Febrile to 102    General-  Comfortable on vent    Neck- no JVD, no cervical adenopathy palpable, no carotid bruit. No bleeding at cannulation site   Lungs- decreased BS on R    Cor- 2/6 holosystolic murmur ,  +gallop   Abdomen- soft, non-tender   Extremities - warm, non-tender,  2+ edema   Neuro- oriented, appropriate, no focal weakness   Lab Results: Recent Labs    October 10, 2017 0430  2017-10-10 1535 10/10/17 0250  WBC 8.6  --   --  11.0*  HGB 12.4*   < > 12.6* 11.5*  HCT 36.0*   < > 37.0* 34.1*  PLT 163  --   --  113*   < > = values in this interval not displayed.   BMET:  Recent Labs     10-10-2017 0430  2017-10-10 1535 10/10/17 0250  NA 125*   < > 123* 126*  K 4.2   < > 4.6 4.4  CL 81*   < > 81* 87*  CO2 29  --   --  27  GLUCOSE 142*   < > 151* 110*  BUN 54*   < > 48* 66*  CREATININE 2.38*   < > 2.90* 2.84*  CALCIUM 8.0*  --   --  7.8*   < > = values in this interval not displayed.    PT/INR:  Recent Labs    09/17/2017 0005  LABPROT 16.4*  INR 1.33   ABG    Component Value Date/Time   PHART 7.470 (H) 10/10/2017 0350   HCO3 31.5 (H) 10/10/2017 0350   TCO2 37 (H) 10/10/2017 1538   ACIDBASEDEF 8.0 (H) 09/25/2017 2308   O2SAT 67.0 10/10/2017 0415   CBG (last 3)  Recent Labs    10/10/17 0305 10/10/17 0804 10/10/17 0931  GLUCAP 100* 52* 138*    Assessment/Plan: S/P Procedure(s) (LRB): PLACEMENT OF IMPELLA 5.0 LEFT VENTRICULAR ASSIST DEVICE (N/A) TRANSESOPHAGEAL ECHOCARDIOGRAM (TEE) (N/A) Not ready to vent wean until volume removed Place R chest tube for pleural effusion Pan culture utrition w/ TF Heparin sliding scale for Impella 5.0  LOS: 10 days    Bryan Harmon 10/10/2017

## 2017-10-10 NOTE — Procedures (Deleted)
Central Venous Catheter Insertion Procedure Note Bryan Harmon [id number] 02/15/1951  Procedure: Insertion of Central Venous Catheter Indications: Assessment of intravascular volume and Drug and/or fluid administration  Procedure Details Consent: Risks of procedure as well as the alternatives and risks of each were explained to the (patient/caregiver).  Consent for procedure obtained. Time Out: Verified patient identification, verified procedure, site/side was marked, verified correct patient position, special equipment/implants available, medications/allergies/relevent history reviewed, required imaging and test results available.  Performed  Maximum sterile technique was used including antiseptics, cap, gloves, gown, hand hygiene, mask and sheet. Skin prep: Chlorhexidine; local anesthetic administered A antimicrobial bonded/coated triple lumen catheter was placed in the left internal jugular vein via seldinger and sutured.  Evaluation Blood flow good Complications: No apparent complications Patient did tolerate procedure well. Chest X-ray ordered to verify placement.  CXR: pending.  Bryan Harmon 10/10/2017, 2:29 PM

## 2017-10-10 NOTE — Progress Notes (Signed)
Op Note   Right 28 F chest tube Placed Dx R pleural effusion, CHF Anesthesia 1% lidocaine local Consent obtained from wife CXR pending

## 2017-10-10 NOTE — Plan of Care (Signed)
  Progressing Cardiac: Ability to achieve and maintain adequate cardiopulmonary perfusion will improve 10/10/2017 2332 - Progressing by Derek Mound, RN Note Hemodynamics within parameters on current gtt's and support   Not Progressing Education: Ability to demonstrate management of disease process will improve 10/10/2017 2332 - Not Progressing by Derek Mound, RN Note Education ongoing with family. Pt remains intubated and sedated Ability to verbalize understanding of medication therapies will improve 10/10/2017 2332 - Not Progressing by Derek Mound, RN Activity: Capacity to carry out activities will improve 10/10/2017 2332 - Not Progressing by Derek Mound, RN

## 2017-10-10 NOTE — Progress Notes (Signed)
[place] for heparin while on Impella 5.0 Indication: Impella 5.0 in setting of LV thrombus and atrial fibrillation  No Known Allergies  Patient Measurements: Height: '5\' 10"'$  (177.8 cm) Weight: 190 lb 4.1 oz (86.3 kg) IBW/kg (Calculated) : 73 Heparin Dosing Weight: 84.1 kg  Vital Signs: Temp: 101.1 F (38.4 C) (01/31 1800) Temp Source: Axillary (01/31 1130) BP: 101/83 (01/31 1800) Pulse Rate: 131 (01/31 1800)  Labs: Recent Labs    09/12/2017 0005  09/12/2017 0411  09/18/2017 0430 09/14/2017 0836 09/23/2017 1158 09/22/2017 1535 10/10/17 0250  HGB  --    < > 12.2*  --  12.4* 12.9*  --  12.6* 11.5*  HCT  --    < > 35.5*  --  36.0* 38.0*  --  37.0* 34.1*  PLT  --   --  207  --  163  --   --   --  113*  APTT  --   --   --   --   --   --   --   --  37*  LABPROT 16.4*  --   --   --   --   --   --   --   --   INR 1.33  --   --   --   --   --   --   --   --   HEPARINUNFRC  --   --  0.43  --   --   --  0.10*  --  <0.10*  CREATININE 2.31*  --  2.20*   < > 2.38* 2.40*  --  2.90* 2.84*   < > = values in this interval not displayed.    Estimated Creatinine Clearance: 26.8 mL/min (A) (by C-G formula based on SCr of 2.84 mg/dL (H)).   Medical History: Past Medical History:  Diagnosis Date  . Hypertension   . Inguinal hernia, bilateral     Medications:  Scheduled:  . chlorhexidine gluconate (MEDLINE KIT)  15 mL Mouth Rinse BID  . Chlorhexidine Gluconate Cloth  6 each Topical Daily  . feeding supplement (PRO-STAT SUGAR FREE 64)  30 mL Per Tube Daily  . insulin aspart  2-6 Units Subcutaneous Q4H  . mouth rinse  15 mL Mouth Rinse 10 times per day  . mupirocin ointment  1 application Nasal BID  . potassium chloride  20 mEq Oral QID  . silver sulfADIAZINE   Topical BID  . sodium chloride flush  3 mL Intravenous Q12H    Assessment: 66yom with LV apical thrombus previously on heparin drip. ECHO showing again small LV apical thrombus but no LA  appendage thrombus. He is also in atrial fibrillation.   Impella 5.0 was placed yesterday with Heparin 50 units/ml in purge solution was started  last night and  systemic heparin starting this morning for ACT goal 160-180 sec.  Purge flow rate 3.80m/hr heparin = 180 uts/hr Peripheral heparin drip just increased 1100 uts/hr with ACTs 120>150 sec today.  Plan to max peripheral heparin drip 1400 uts/hr tonight with recent bleeding - discussed with RN and Dr. VDarcey Nora   CT placed today with 158moutput but none since this afternoon, and neck hematoma from new CL placement  - stable per RN, hgb ok at 11 this am.   Will continue to monitor ACT along with nursing to provide any assistance.  LiBonnita Nasutiharm.D. CPP, BCPS Clinical Pharmacist [phone]/31/2019 6:45 PM

## 2017-10-10 NOTE — Procedures (Signed)
Central Venous Catheter Insertion Procedure Note JAKARI JEAN [id number] 1950/11/17  Procedure: Insertion of Central Venous Catheter Indications: Assessment of intravascular volume, Drug and/or fluid administration and Frequent blood sampling  Procedure Details Consent: Risks of procedure as well as the alternatives and risks of each were explained to the (patient/caregiver).  Consent for procedure obtained. Time Out: Verified patient identification, verified procedure, site/side was marked, verified correct patient position, special equipment/implants available, medications/allergies/relevent history reviewed, required imaging and test results available.  Performed  Maximum sterile technique was used including antiseptics, cap, gloves, gown, hand hygiene, mask and sheet. Skin prep: Chlorhexidine; local anesthetic administered A antimicrobial bonded/coated triple lumen catheter was placed in the left subclavian vein using the Seldinger technique.  Evaluation Blood flow good Complications: No apparent complications Patient did tolerate procedure well. Chest X-ray ordered to verify placement.  CXR: pending.  Trinidi Toppins 10/10/2017, 3:30 PM

## 2017-10-10 NOTE — Consult Note (Signed)
WOC Nurse wound consult note Reason for Consult: penile wounds from condom cath Wound type: MARSI (medical adhesive skin damage) Pressure Injury POA: NA Measurement:circufrencially skin irritation noted on the glans, on the shaft there are several areas of small less than 0.1cm x 0.1cm areas of partial thickness skin loss. It is unclear if the patient had a traumatic removal of the device. It was reported the skin damage was noted after removal.  Wound bed: where the skin is open, the wounds area clean, pink, moist  Drainage (amount, consistency, odor) scant Periwound: intact, edema  Dressing procedure/placement/frequency: Xeroform gauze as nonadherent with antibacterial properties, moisture management. Monitor each shift for changes.   WOC nurse will follow along for weekly wound assessments.   Dory Demont [facility], CNS, [employer] [phone]

## 2017-10-10 NOTE — Progress Notes (Signed)
PULMONARY / CRITICAL CARE MEDICINE   Name: Bryan Harmon MRN: [id number] DOB: [birth year]    ADMISSION DATE:  09/24/2017  REFERRING MD:  Morton Peters  CHIEF COMPLAINT:  Fatigue  HISTORY OF PRESENT ILLNESS:   67 y/o male with non-ischemic cardiomyopathy and afib admitted with decompensated heart failure.    SUBJECTIVE:  Impella placed yesterday Fever overnight WBC up   VITAL SIGNS: BP 97/79   Pulse (!) 103   Temp (!) 100.8 F (38.2 C)   Resp 16   Ht 5\' 10"  (1.778 m)   Wt 190 lb 4.1 oz (86.3 kg)   SpO2 100%   BMI 27.30 kg/m   HEMODYNAMICS: PAP: (33-58)/(11-35) 33/15 CVP:  [12 mmHg-24 mmHg] 12 mmHg CO:  [3 L/min-4.8 L/min] 4.2 L/min CI:  [1.5 L/min/m2-2.4 L/min/m2] 2.1 L/min/m2  VENTILATOR SETTINGS: Vent Mode: SIMV;PSV;PRVC FiO2 (%):  [50 %] 50 % Set Rate:  [12 bmp] 12 bmp Vt Set:  [580 mL] 580 mL PEEP:  [5 cmH20] 5 cmH20 Pressure Support:  [10 cmH20] 10 cmH20 Plateau Pressure:  [13 cmH20-15 cmH20] 13 cmH20  INTAKE / OUTPUT: I/O last 3 completed shifts: In: 5497.4 [I.V.:4008.4; Other:94; NG/GT:609; IV Piggyback:786] Out: 3699 [Urine:3499; Blood:200]  PHYSICAL EXAMINATION:  General:  In bed on vent HENT: NCAT ETT in place PULM: diminished left base, vent supported breathing CV: RRR, no mgr GI: BS+, soft, nontender MSK: normal bulk and tone Neuro: sedated on vent Derm: L IJ, R IJ, R [place] lines well dressed   LABS:  BMET Recent Labs  Lab 10/10/2017 1439 10/24/2017 0430 10/16/2017 0836 10/11/2017 1535 10/10/17 0250  NA 125* 125* 124* 123* 126*  K 3.9 4.2 4.1 4.6 4.4  CL 79* 81* 80* 81* 87*  CO2 29 29  --   --  27  BUN 52* 54* 47* 48* 66*  CREATININE 2.33* 2.38* 2.40* 2.90* 2.84*  GLUCOSE 150* 142* 158* 151* 110*    Electrolytes Recent Labs  Lab 09/30/2017 1439 Oct 28, 2017 0430 Oct 28, 2017 1631 10/10/17 0250  CALCIUM 8.2* 8.0*  --  7.8*  MG  --  2.1 2.1 2.2  PHOS  --  2.9 4.2 3.5    CBC Recent Labs  Lab 10/01/2017 0411 11/05/2017 0430  10/20/2017 0836 10/24/2017 1535 10/10/17 0250  WBC 7.3 8.6  --   --  11.0*  HGB 12.2* 12.4* 12.9* 12.6* 11.5*  HCT 35.5* 36.0* 38.0* 37.0* 34.1*  PLT 207 163  --   --  113*    Coag's Recent Labs  Lab 09/27/2017 0005 10/10/17 0250  APTT  --  37*  INR 1.33  --     Sepsis Markers Recent Labs  Lab 09/22/2017 1524 10/02/2017 0411 10/22/2017 0430  PROCALCITON 4.72 4.42 6.00    ABG Recent Labs  Lab Oct 28, 2017 0840 28-Oct-2017 1538 10/10/17 0350  PHART 7.383 7.520* 7.470*  PCO2ART 50.1* 44.5 43.9  PO2ART 107.0 156.0* 171*    Liver Enzymes Recent Labs  Lab 10/06/17 0511 10/09/2017 0448 10/09/2017 0005 10/10/17 0250  AST 214*  --  112* 103*  ALT 484*  --  239* 176*  ALKPHOS 134*  --  137* 111  BILITOT 1.2  --  0.8 1.2  ALBUMIN 2.3* 2.3* 2.2* 2.0*    Cardiac Enzymes No results for input(s): TROPONINI, PROBNP in the last 168 hours.  Glucose Recent Labs  Lab 10/29/2017 2003 October 28, 2017 2358 10/10/17 0305 10/10/17 0804  GLUCAP 119* 143* 100* 52*    Imaging Dg Chest Portable 1 View  Result Date:  09/26/2017 CLINICAL DATA:  Patient status post placement of an Impella left ventricular assist device today. EXAM: PORTABLE CHEST 1 VIEW COMPARISON:  CT chest 09/17/2017. Single-view of the chest 10/07/2016. FINDINGS: Left ventricular assist device from a right IJ approach is in place. Tip of the device projects near the apex of the left ventricle. Right IJ approach Swan-Ganz catheter is in the distal right main pulmonary artery. ET tube is in good position with the tip at the level of the clavicular heads. There is no pneumothorax. Multifocal bilateral airspace disease is worst in the right base. There is massive cardiomegaly. IMPRESSION: Left ventricular assist device in place.  Negative for pneumothorax. Swan-Ganz catheter tip projects in the distal right main pulmonary artery. ET tube is in good position with tip at the level of clavicular heads. Right worse than left airspace disease  compatible with multifocal pneumonia seen on CT scan. Massive cardiomegaly. Electronically Signed   By: Drusilla Kanner M.D.   On: 09/29/2017 11:13   Dg Abd Portable 1v  Result Date: 09/23/2017 CLINICAL DATA:  Feeding tube placement. EXAM: PORTABLE ABDOMEN - 1 VIEW COMPARISON:  None. FINDINGS: Feeding tube is in place with the tip proximal fourth portion of the duodenum. The bowel gas pattern is normal. IMPRESSION: Feeding tube tip is in the proximal fourth portion of the duodenum. Electronically Signed   By: Drusilla Kanner M.D.   On: 09/22/2017 15:00   Dg C-arm 1-60 Min-no Report  Result Date: 10/04/2017 Fluoroscopy was utilized by the requesting physician.  No radiographic interpretation.     Lines/ Tubes 1/30 L IJ CVL >  1/30 R IJ swan 1/30 R sub clav impella 1/30 L radial arterial line 1/30 ETT >   Cultures: 1/21 Blood>>Strep Pneumoniae pan sens 1/21 Resp Viral Panel>> + Parainfluenza Virus 3 1/28 blood >  1/31 blood c>  1/31 resp culture  ABX:  1/21>> Rocephin >>> 1/29 1/21>> Doxy 1/23 1/30 Vanc >  1/30 Mero >   Studies: 10/01/2017>> Echo EF 15-20%, [place] ? PAP muscle vs. Thrombus, LV severely dilated, moderate concentric hypertrophy, systolic function normal, unable to evaluate LV diastolic function due to atrial fibrillation. + moderate spontaneous echo contrast, indicative of stasis Impression 1/23 Echo > Severely dilated LV with severe LV dysfunction globally with EF 15-20%. Severely dialted RV with severe RV dysfunction. Moderate to severe MR with ERO 0.33cm2 and MR volume 41ml. Moderate tosevere TR with moderate pulmonary HTN. Moderately thickened and calcified AV leaflets with mild MR, mildly dilated aortic root, massive biatrial enlargement. Cannot rule out LV thrombus. Thereis significant spontaneous echo contrast in LV c/w sluggish blood flow. The right ventricular systolic pressure was increasedconsistent with moderate pulmonary hypertension. 1/29 CT chest  > dense bilateral lower lobe consolidation R>L 1/30 TEE >>> LVEF 20-25%, impella in place, poor overall contractility but LV improved    DISCUSSION: 67 y/o male with ischemic cardiomyopathy admitted with decompensated systolic heart failure in the setting of strep pneumo pneumonia.  He has acute on chronic kidney failure, cardiogenic shock. New fever on 1/30 of undetermined cause.   ASSESSMENT / PLAN:  PULMONARY A: Acute respiratory failure with hypoxemia CAP Acute pulmonary edema Bilateral pleural effusions P:   Full mechanical vent support > change to PRVC VAP prevention Daily WUA/SBT See ID Agree with diuresis No plans to extubate today  CARDIOVASCULAR A:  Acute decompensated cardiomyopathy> severe biventricular failure S/p Impella 1/30 Cardiogenic shock P:  Tele Inotropes, mechanical support per primary service Diuresis Amiodarone per primary service  RENAL A:   AKI Hypokalemia/hypervolemic P:   Monitor BMET and UOP Replace electrolytes as needed Agree with diuresis  GASTROINTESTINAL A:   No acute issues P:   Continue tube feeding  HEMATOLOGIC A:   Mild anemia, thrombocytopenia without bleeding P:  Monitor for bleeding  INFECTIOUS A:   Severe CAP HCAP?  Fever 1/30? Parainfluenza virus pneumonia P:   Agree with line change, re-culture mucus, broad spectrum antibiotics  Would narrow antibiotics by 2/1 if no new culture Plan for prolonged course of clinical recovery, CXR will take weeks to months to recover  ENDOCRINE A:   Hyperglycemia P:   SSI  NEUROLOGIC A:   Need for sedation with mechanical ventilation P:   RASS goal: -1 PAD protocol: precedex, prn fentanyl, prn versed   FAMILY  - Updates: wife updated bedside by me  - Inter-disciplinary family meet or Palliative Care meeting due by:  day 7  My cc time 38 minutes  Heber [place], MD [place] [facility] Pager: [phone] Cell: [phone] After 3pm or if no response, call  [phone]    10/10/2017, 8:32 AM

## 2017-10-10 NOTE — Progress Notes (Signed)
Dr. Donata Clay spoke with again regarding impella outflow alarms. Orders received for albumin and lasix gtt to 4mg /hr. Will continue to monitor closely. Modena Jansky RN 2 Heart

## 2017-10-10 NOTE — Progress Notes (Signed)
VAD coordinator met with patient's wife and give brief overview of advance heart failure options including long term LVAD and heart transplant.   We discussed the process of the evaluation for both. She understands her husband is too sick right now to be considered a candidate for advanced heart failure options. She asked for educational materials re: HM III LVAD. HM III and AHF team pamphlets given for her review.  Wife is hopeful that pt will recover and will not need advanced options in the future. She spoke with Dr. Aundra Dubin at bedside during echo.   Advised her to contact either myself, Tanda Rockers, or Balinda Quails with and additional questions.   Zada Girt, RN VAD Coordinator [phone]

## 2017-10-10 NOTE — Progress Notes (Signed)
  Echocardiogram 2D Echocardiogram has been performed.  Bryan Harmon 10/10/2017, 9:31 AM

## 2017-10-10 NOTE — Progress Notes (Signed)
Bilateral lower extremity venous duplex has been completed. Negative for DVT. Results were given to the patient's nurse, Trula Ore.  10/10/17 9:49 AM Olen Cordial RVT

## 2017-10-10 NOTE — Progress Notes (Signed)
ABG results given to CCM NP, no new RT orders received.

## 2017-10-10 NOTE — Progress Notes (Signed)
Dr [name] spoke with and orders received to decrease P level to p8. Will continue to monitor closely. Modena Jansky Rn 2 Heart

## 2017-10-10 NOTE — Progress Notes (Addendum)
Patient ID: Bryan Harmon, male   DOB: 12-Sep-1950, 67 y.o.   MRN: [id number]     Advanced Heart Failure Rounding Note  PCP:  Primary Cardiologist: Aundra Dubin   Subjective:    Impella 5.0 placed in the OR 1/30.  Remains intubated post-op.  Currently on norepinephrine 24, milrinone 0.5, amiodarone 30 mg/hr. MAP 70s-80s overnight.  Reasonable UOP but I > O.  HR in 90s-100s afib on amiodarone 30 mg/hr.   LDH 443 => 517.  Plts lower at 113.   Impella 5.0  P9 Flow 4.9 L/min No alarms  [place]: CVP 16 PA 44/14 CI 2.2 PAPi 1.9 Co-ox 67%  Respiratory virus screen: Parainfluenza virus.  Bld CX- Strep pneumoniae--> IV rocephin initially.  CXR with persistent RLL PNA.  CT chest with multifocal PNA 1/29.  Persistent fever, infiltrates, and elevated procalcitonin => abx switched to meropenem + vancomycin 1/30.  Low grade fever to around 100 overnight.   Echo: Severe LV dilation, EF 15-20%, severely dilated RV with severely decreased RV systolic function, moderate-severe MR, moderate-severe TR, cannot rule out LV apical thrombus.  Repeat echo with Definity showed definite LV apical thrombus.   TEE (1/28): EF 10-15%, diffuse hypokinesis, small LV apical thrombus, mildly dilated RV with moderately decreased systolic function, no LAA thrombus.    [facility] Procedural Findings (1/29): Hemodynamics (mmHg) RA mean 11 RV 37/11 PA 41/22, mean 31 PCWP mean 24 Oxygen saturations: PA 40% AO 92% Cardiac Output (Fick) 3.1  Cardiac Index (Fick) 1.54 Cardiac Output (Thermo) 3.25 Cardiac Index (Thermo) 1.62  PVR 2.15 WU  Objective:   Weight Range: 190 lb 4.1 oz (86.3 kg) Body mass index is 27.3 kg/m.   Vital Signs:   Temp:  [98.8 F (37.1 C)-101.3 F (38.5 C)] 100.8 F (38.2 C) (01/31 0700) Pulse Rate:  [25-112] 92 (01/31 0615) Resp:  [11-23] 16 (01/31 0700) BP: (62-141)/(26-110) 97/79 (01/31 0630) SpO2:  [75 %-100 %] 90 % (01/31 0615) Arterial Line BP: (83-112)/(72-88) 91/72 (01/31  0700) FiO2 (%):  [50 %] 50 % (01/31 0354) Weight:  [190 lb 4.1 oz (86.3 kg)] 190 lb 4.1 oz (86.3 kg) (01/31 0427) Last BM Date: 10/03/17  Weight change: Filed Weights   10/10/2017 0500 09/24/2017 0500 10/10/17 0427  Weight: 179 lb 7.3 oz (81.4 kg) 183 lb 13.8 oz (83.4 kg) 190 lb 4.1 oz (86.3 kg)    Intake/Output:   Intake/Output Summary (Last 24 hours) at 10/10/2017 0743 Last data filed at 10/10/2017 1324 Gross per 24 hour  Intake 4534.95 ml  Output 2949 ml  Net 1585.95 ml      Physical Exam  CVP 16 General: Intubated/sedated Neck: JVP difficult with bilateral IJs, no thyromegaly or thyroid nodule.  Lungs: Decreased breath sounds bilateral bases.  CV: Lateral PMI.  Heart irregular S1/S2, no S3/S4, 1/6 HSM apex.  1+ edema to knees.   Abdomen: Soft, no hepatosplenomegaly, no distention.  Skin: Intact without lesions or rashes.  Neurologic: Sedated  Extremities: No clubbing or cyanosis.  HEENT: Normal.   Telemetry   Afib 90s-100s, personally reviewed.   EKG    No new tracings.    Labs    CBC Recent Labs    09/25/2017 0430  10/05/2017 1535 10/10/17 0250  WBC 8.6  --   --  11.0*  NEUTROABS 7.8*  --   --  9.5*  HGB 12.4*   < > 12.6* 11.5*  HCT 36.0*   < > 37.0* 34.1*  MCV 91.8  --   --  90.9  PLT 163  --   --  113*   < > = values in this interval not displayed.   Basic Metabolic Panel Recent Labs    10/01/2017 0430  10/10/2017 1535 09/12/2017 1631 10/10/17 0250  NA 125*   < > 123*  --  126*  K 4.2   < > 4.6  --  4.4  CL 81*   < > 81*  --  87*  CO2 29  --   --   --  27  GLUCOSE 142*   < > 151*  --  110*  BUN 54*   < > 48*  --  66*  CREATININE 2.38*   < > 2.90*  --  2.84*  CALCIUM 8.0*  --   --   --  7.8*  MG 2.1  --   --  2.1 2.2  PHOS 2.9  --   --  4.2 3.5   < > = values in this interval not displayed.   Liver Function Tests Recent Labs    10/05/2017 0005 10/10/17 0250  AST 112* 103*  ALT 239* 176*  ALKPHOS 137* 111  BILITOT 0.8 1.2  PROT 5.7* 5.4*   ALBUMIN 2.2* 2.0*   No results for input(s): LIPASE, AMYLASE in the last 72 hours. Cardiac Enzymes No results for input(s): CKTOTAL, CKMB, CKMBINDEX, TROPONINI in the last 72 hours.  BNP: BNP (last 3 results) Recent Labs    12/13/16 0949 02/11/17 1049 09/12/2017 1218  BNP 369.2* 562.7* 2,485.9*    ProBNP (last 3 results) No results for input(s): PROBNP in the last 8760 hours.   D-Dimer No results for input(s): DDIMER in the last 72 hours. Hemoglobin A1C No results for input(s): HGBA1C in the last 72 hours. Fasting Lipid Panel No results for input(s): CHOL, HDL, LDLCALC, TRIG, CHOLHDL, LDLDIRECT in the last 72 hours. Thyroid Function Tests No results for input(s): TSH, T4TOTAL, T3FREE, THYROIDAB in the last 72 hours.  Invalid input(s): FREET3  Other results:   Imaging    Dg Chest Portable 1 View  Result Date: 09/24/2017 CLINICAL DATA:  Patient status post placement of an Impella left ventricular assist device today. EXAM: PORTABLE CHEST 1 VIEW COMPARISON:  CT chest 09/10/2017. Single-view of the chest 10/07/2016. FINDINGS: Left ventricular assist device from a right IJ approach is in place. Tip of the device projects near the apex of the left ventricle. Right IJ approach Swan-Ganz catheter is in the distal right main pulmonary artery. ET tube is in good position with the tip at the level of the clavicular heads. There is no pneumothorax. Multifocal bilateral airspace disease is worst in the right base. There is massive cardiomegaly. IMPRESSION: Left ventricular assist device in place.  Negative for pneumothorax. Swan-Ganz catheter tip projects in the distal right main pulmonary artery. ET tube is in good position with tip at the level of clavicular heads. Right worse than left airspace disease compatible with multifocal pneumonia seen on CT scan. Massive cardiomegaly. Electronically Signed   By: Inge Rise M.D.   On: 09/14/2017 11:13   Dg Abd Portable 1v  Result Date:  09/13/2017 CLINICAL DATA:  Feeding tube placement. EXAM: PORTABLE ABDOMEN - 1 VIEW COMPARISON:  None. FINDINGS: Feeding tube is in place with the tip proximal fourth portion of the duodenum. The bowel gas pattern is normal. IMPRESSION: Feeding tube tip is in the proximal fourth portion of the duodenum. Electronically Signed   By: Inge Rise M.D.   On: 09/15/2017  15:00   Dg C-arm 1-60 Min-no Report  Result Date: 09/23/2017 Fluoroscopy was utilized by the requesting physician.  No radiographic interpretation.     Medications:     Scheduled Medications: . chlorhexidine gluconate (MEDLINE KIT)  15 mL Mouth Rinse BID  . Chlorhexidine Gluconate Cloth  6 each Topical Daily  . feeding supplement (PRO-STAT SUGAR FREE 64)  30 mL Per Tube Daily  . insulin aspart  2-6 Units Subcutaneous Q4H  . mouth rinse  15 mL Mouth Rinse 10 times per day  . metolazone  2.5 mg Per Tube Once  . mupirocin ointment  1 application Nasal BID  . potassium chloride  20 mEq Oral QID  . sodium chloride flush  3 mL Intravenous Q12H    Infusions: . sodium chloride 250 mL (10/04/2017 2112)  . amiodarone 30 mg/hr (10/10/17 0600)  . dexmedetomidine 0.7 mcg/kg/hr (10/10/17 0606)  . dextrose 5 % Impella 5.0 Purge solution    . feeding supplement (VITAL AF 1.2 CAL) 1,000 mL (10/10/17 0600)  . furosemide (LASIX) infusion    . impella catheter heparin 50 unit/mL in dextrose 5%    . heparin 500 Units/hr (10/10/17 0600)  . meropenem (MERREM) IV Stopped (10/10/17 3903)  . milrinone 0.5 mcg/kg/min (10/10/17 0609)  . norepinephrine (LEVOPHED) Adult infusion 24 mcg/min (10/10/17 0600)  . vancomycin Stopped (09/23/2017 2221)    PRN Medications: sodium chloride, acetaminophen (TYLENOL) oral liquid 160 mg/5 mL, albuterol, morphine injection, ondansetron (ZOFRAN) IV, sodium chloride flush, sodium chloride flush    Patient Profile  Mr [name] is a 67 year old with a history of PAF S/P DC-CV 11/2016, s/p bilateral inguinal  hernia repair 11/2017, PVCs, HTN, NICM, chronic systolic heart failure.   Sent from Urgent Care with A fib RVR. Acutely SOB on arrival   Assessment/Plan   1. Acute hypoxemic respiratory failure: RLL PNA, Strep pneumoniae in blood cultures. Respiratory cultures with parainfluenzae virus. Also acute/chronic systolic CHF.Now intubated s/p Impella 5.0 placement, multifocal PNA on CXR and CT chest.  -Antibiotics broadened from ceftriaxone to vancomycin/meropenem on 1/30.  -CVP 16. Continue IV diuresis.  2.Acute on chronic systolic CHF: Nonischemic cardiomyopathy. 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 Etiology of his CHF is not clear =>no definite inciting event. He has a history of HTN but doubt this was the only trigger. 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 This would ideally be confirmed by cMRI, but he has not wanted an MRI (concerned about side effects). He also has frequent PVCs, 21% total on last holter in 4/18 which is a risk for fall in EF.No family history of CMP. Cannot rule out viral myocarditis. SPEP negative. With medical management, he initially felt a lot better. However, he quit all his meds in early 2018 with recurrence of NYHA III symptoms and onset of atrial fibrillation.He had TEE-DCCV in 3/18.TEE showed that EF remained25%. He quit his meds again in 4/18 and apparently did not restart them when I asked him to in 6/18. No meds probably since 4/18. Echo this admission with EF 15-20%, severe RV dysfunction, LV apical thrombus.  Attempted DCCV on 1/28 failed likely due to pressors/inotropes.  [place] 1/29 with low cardiac output => Impella 5.0 placed 1/30.  Cardiac index improved this morning, CI 2.2 from [place] with co-ox 67%.  Impella at P9 with good flow.  Milrinone continues at 0.5 for RV support and norepinephrine decreased to 24.  I > O, filling pressures elevated,  BUN/creatinine have  generally trended up for the last day but creatinine is down some this am.   - Continue current milrinone for now, titrate down norepinephrine as able.   -Transition to Lasix 12 mg/hr and will give a dose of metolazone 2.5 x 1 today.  - Some rise in LDH and fall in platelets this morning though not marked. Will review device position with Impella rep under echo this morning.   3. 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 It is possible that the PVCs contribute to his cardiomyopathy. -Onamiodarone. No change. 4. Atrial fibrillation:Persistent, now with RVR. HR up to 140sinitially, now 110s on amiodarone gtt at30. Not sure how long he has been in atrial fibrillation with marked RVR, but this may have triggered his CHF and AKI due to worsening of cardiac output.  HR now better controlled.  -Heparin gtt restarted. - Continue amiodarone gtt at 30 mg/hr.   - Failed TEE-guided DCCV on 1/28 in setting of vasoactive meds.  If we can wean down milrinone/norepinephrine, will re-attempt DCCV in future.   5. AKI: Creatinine 1.08 in 6/18, he has not been on any meds. Suspect cardiorenal syndrome with afib/RVR and fall in cardiac output.He was initially hyperkalemic and acidotic.Creatinine up to 2.8.  Hopefully will improve now that cardiac output is better with Impella in place.  6. JK:DTOIZ pneumo PNA as well as parainfluenza virus.CT chest 1/29 with multifocal PNA. Temperature around 100 overnight, abx changed on 1/30 to vancomycin/meropenem.   - Cultures negative so far.  - Repeat PCT to see if trending down.  7. Elevated LFTs: Suspect shock liver type picture. - AST/ALT > 1000 at admission but have trended down steadily.  8. LV thrombus: Noted on TTE and TEE, small.  - Heparin gtt.    9. Hyponatremia: Hypervolemic hyponatremia. - Na remains low at 126 this am, some imrpovement.  10. Malnutrition: tube feeds.     CRITICAL CARE Performed by: Loralie Champagne  Total critical  care time: 40 minutes  Critical care time was exclusive of separately billable procedures and treating other patients.  Critical care was necessary to treat or prevent imminent or life-threatening deterioration.  Critical care was time spent personally by me on the following activities: development of treatment plan with patient and/or surrogate as well as nursing, discussions with consultants, evaluation of patient's response to treatment, examination of patient, obtaining history from patient or surrogate, ordering and performing treatments and interventions, ordering and review of laboratory studies, ordering and review of radiographic studies, pulse oximetry and re-evaluation of patient's condition.  Loralie Champagne 10/10/2017 7:43 AM   Impella visualized by echo, located at 3.5 cm. Position looks good, no changes.  By echo, RV mildly dilated with mild to moderate systolic dysfunction (lateral annulus tissue doppler 14 which is good).   Loralie Champagne 10/10/2017 9:32 AM

## 2017-10-11 ENCOUNTER — Inpatient Hospital Stay (HOSPITAL_COMMUNITY): Payer: Medicare Other

## 2017-10-11 ENCOUNTER — Encounter (HOSPITAL_COMMUNITY): Payer: Self-pay | Admitting: Cardiothoracic Surgery

## 2017-10-11 DIAGNOSIS — Z95811 Presence of heart assist device: Secondary | ICD-10-CM

## 2017-10-11 DIAGNOSIS — I5023 Acute on chronic systolic (congestive) heart failure: Secondary | ICD-10-CM

## 2017-10-11 DIAGNOSIS — J189 Pneumonia, unspecified organism: Secondary | ICD-10-CM | POA: Diagnosis not present

## 2017-10-11 LAB — MAGNESIUM: Magnesium: 2 mg/dL (ref 1.7–2.4)

## 2017-10-11 LAB — BLOOD GAS, ARTERIAL
Acid-Base Excess: 9.4 mmol/L — ABNORMAL HIGH (ref 0.0–2.0)
Bicarbonate: 33.3 mmol/L — ABNORMAL HIGH (ref 20.0–28.0)
Drawn by: 414221
FIO2: 40
O2 Saturation: 99 %
PEEP: 5 cmH2O
Patient temperature: 100
RATE: 8 resp/min
pCO2 arterial: 45.5 mmHg (ref 32.0–48.0)
pH, Arterial: 7.48 — ABNORMAL HIGH (ref 7.350–7.450)
pO2, Arterial: 147 mmHg — ABNORMAL HIGH (ref 83.0–108.0)

## 2017-10-11 LAB — CBC
HCT: 27.6 % — ABNORMAL LOW (ref 39.0–52.0)
HCT: 25.1 % — ABNORMAL LOW (ref 39.0–52.0)
Hemoglobin: 9.7 g/dL — ABNORMAL LOW (ref 13.0–17.0)
Hemoglobin: 8.5 g/dL — ABNORMAL LOW (ref 13.0–17.0)
MCH: 32.2 pg (ref 26.0–34.0)
MCH: 30.9 pg (ref 26.0–34.0)
MCHC: 35.1 g/dL (ref 30.0–36.0)
MCHC: 33.9 g/dL (ref 30.0–36.0)
MCV: 91.7 fL (ref 78.0–100.0)
MCV: 91.3 fL (ref 78.0–100.0)
Platelets: 92 10*3/uL — ABNORMAL LOW (ref 150–400)
Platelets: 136 10*3/uL — ABNORMAL LOW (ref 150–400)
RBC: 3.01 MIL/uL — ABNORMAL LOW (ref 4.22–5.81)
RBC: 2.75 MIL/uL — ABNORMAL LOW (ref 4.22–5.81)
RDW: 14 % (ref 11.5–15.5)
RDW: 14.1 % (ref 11.5–15.5)
WBC: 10.1 10*3/uL (ref 4.0–10.5)
WBC: 13.1 10*3/uL — ABNORMAL HIGH (ref 4.0–10.5)

## 2017-10-11 LAB — POCT ACTIVATED CLOTTING TIME
ACTIVATED CLOTTING TIME: 147 s
ACTIVATED CLOTTING TIME: 147 s
ACTIVATED CLOTTING TIME: 153 s
ACTIVATED CLOTTING TIME: 164 s
ACTIVATED CLOTTING TIME: 164 s
ACTIVATED CLOTTING TIME: 164 s
Activated Clotting Time: 153 seconds
Activated Clotting Time: 153 seconds
Activated Clotting Time: 158 seconds
Activated Clotting Time: 164 seconds
Activated Clotting Time: 147 seconds
Activated Clotting Time: 153 seconds

## 2017-10-11 LAB — ECHOCARDIOGRAM LIMITED
Height: 70 in
Weight: 2895.96 oz

## 2017-10-11 LAB — POCT I-STAT, CHEM 8
BUN: 54 mg/dL — ABNORMAL HIGH (ref 6–20)
CALCIUM ION: 1.05 mmol/L — AB (ref 1.15–1.40)
CHLORIDE: 89 mmol/L — AB (ref 101–111)
Creatinine, Ser: 2.7 mg/dL — ABNORMAL HIGH (ref 0.61–1.24)
Glucose, Bld: 227 mg/dL — ABNORMAL HIGH (ref 65–99)
HCT: 27 % — ABNORMAL LOW (ref 39.0–52.0)
HEMOGLOBIN: 9.2 g/dL — AB (ref 13.0–17.0)
POTASSIUM: 4.1 mmol/L (ref 3.5–5.1)
SODIUM: 131 mmol/L — AB (ref 135–145)
TCO2: 32 mmol/L (ref 22–32)

## 2017-10-11 LAB — COMPREHENSIVE METABOLIC PANEL
ALT: 113 U/L — ABNORMAL HIGH (ref 17–63)
AST: 78 U/L — ABNORMAL HIGH (ref 15–41)
Albumin: 2.2 g/dL — ABNORMAL LOW (ref 3.5–5.0)
Alkaline Phosphatase: 87 U/L (ref 38–126)
Anion gap: 12 (ref 5–15)
BUN: 67 mg/dL — ABNORMAL HIGH (ref 6–20)
CO2: 28 mmol/L (ref 22–32)
Calcium: 7.7 mg/dL — ABNORMAL LOW (ref 8.9–10.3)
Chloride: 92 mmol/L — ABNORMAL LOW (ref 101–111)
Creatinine, Ser: 2.72 mg/dL — ABNORMAL HIGH (ref 0.61–1.24)
GFR calc Af Amer: 27 mL/min — ABNORMAL LOW (ref >60)
GFR calc non Af Amer: 23 mL/min — ABNORMAL LOW (ref >60)
Glucose, Bld: 137 mg/dL — ABNORMAL HIGH (ref 65–99)
Potassium: 3.7 mmol/L (ref 3.5–5.1)
Sodium: 132 mmol/L — ABNORMAL LOW (ref 135–145)
Total Bilirubin: 1.2 mg/dL (ref 0.3–1.2)
Total Protein: 5 g/dL — ABNORMAL LOW (ref 6.5–8.1)

## 2017-10-11 LAB — CBC WITH DIFFERENTIAL/PLATELET
Basophils Absolute: 0 10*3/uL (ref 0.0–0.1)
Basophils Relative: 0 %
Eosinophils Absolute: 0 10*3/uL (ref 0.0–0.7)
Eosinophils Relative: 0 %
HCT: 28 % — ABNORMAL LOW (ref 39.0–52.0)
Hemoglobin: 9.3 g/dL — ABNORMAL LOW (ref 13.0–17.0)
Lymphocytes Relative: 12 %
Lymphs Abs: 1.1 10*3/uL (ref 0.7–4.0)
MCH: 30.4 pg (ref 26.0–34.0)
MCHC: 33.2 g/dL (ref 30.0–36.0)
MCV: 91.5 fL (ref 78.0–100.0)
Monocytes Absolute: 0.5 10*3/uL (ref 0.1–1.0)
Monocytes Relative: 5 %
Neutro Abs: 7.9 10*3/uL — ABNORMAL HIGH (ref 1.7–7.7)
Neutrophils Relative %: 83 %
Platelets: 88 10*3/uL — ABNORMAL LOW (ref 150–400)
RBC: 3.06 MIL/uL — ABNORMAL LOW (ref 4.22–5.81)
RDW: 14 % (ref 11.5–15.5)
WBC: 9.5 10*3/uL (ref 4.0–10.5)

## 2017-10-11 LAB — PHOSPHORUS: Phosphorus: 3.2 mg/dL (ref 2.5–4.6)

## 2017-10-11 LAB — CALCIUM, IONIZED: Calcium, Ionized, Serum: 4.6 mg/dL (ref 4.5–5.6)

## 2017-10-11 LAB — GLUCOSE, CAPILLARY
GLUCOSE-CAPILLARY: 150 mg/dL — AB (ref 65–99)
GLUCOSE-CAPILLARY: 164 mg/dL — AB (ref 65–99)
Glucose-Capillary: 152 mg/dL — ABNORMAL HIGH (ref 65–99)
Glucose-Capillary: 99 mg/dL (ref 65–99)
Glucose-Capillary: 228 mg/dL — ABNORMAL HIGH (ref 65–99)
Glucose-Capillary: 200 mg/dL — ABNORMAL HIGH (ref 65–99)

## 2017-10-11 LAB — PROCALCITONIN: PROCALCITONIN: 6.65 ng/mL

## 2017-10-11 LAB — LACTATE DEHYDROGENASE: LDH: 585 U/L — ABNORMAL HIGH (ref 98–192)

## 2017-10-11 LAB — APTT: aPTT: 58 seconds — ABNORMAL HIGH (ref 24–36)

## 2017-10-11 LAB — COOXEMETRY PANEL
Carboxyhemoglobin: 1.2 % (ref 0.5–1.5)
Methemoglobin: 1.1 % (ref 0.0–1.5)
O2 Saturation: 63.1 %
Total hemoglobin: 11.9 g/dL — ABNORMAL LOW (ref 12.0–16.0)

## 2017-10-11 LAB — HEPARIN LEVEL (UNFRACTIONATED): Heparin Unfractionated: <0.1 IU/mL — ABNORMAL LOW (ref 0.30–0.70)

## 2017-10-11 MED ORDER — ALBUMIN HUMAN 5 % IV SOLN
12.5000 g | Freq: Once | INTRAVENOUS | Status: AC
  Administered 2017-10-11: 12.5 g via INTRAVENOUS
  Filled 2017-10-11: qty 250

## 2017-10-11 MED ORDER — HEPARIN (PORCINE) IN NACL 100-0.45 UNIT/ML-% IJ SOLN
1300.0000 [IU]/h | INTRAMUSCULAR | Status: DC
  Administered 2017-10-12 (×2): 1600 [IU]/h via INTRAVENOUS
  Administered 2017-10-13 (×2): 1700 [IU]/h via INTRAVENOUS
  Administered 2017-10-14: 1400 [IU]/h via INTRAVENOUS
  Administered 2017-10-15: 1600 [IU]/h via INTRAVENOUS
  Administered 2017-10-16: 1700 [IU]/h via INTRAVENOUS
  Filled 2017-10-11 (×7): qty 250

## 2017-10-11 MED ORDER — INSULIN GLARGINE 100 UNIT/ML ~~LOC~~ SOLN
12.0000 [IU] | Freq: Two times a day (BID) | SUBCUTANEOUS | Status: DC
  Administered 2017-10-11 – 2017-10-27 (×31): 12 [IU] via SUBCUTANEOUS
  Filled 2017-10-11 (×34): qty 0.12

## 2017-10-11 MED ORDER — ACETAMINOPHEN 160 MG/5ML PO SOLN
650.0000 mg | ORAL | Status: DC | PRN
  Administered 2017-10-11 – 2017-10-27 (×23): 650 mg via ORAL
  Filled 2017-10-11 (×24): qty 20.3

## 2017-10-11 MED ORDER — METHYLPREDNISOLONE SODIUM SUCC 125 MG IJ SOLR
80.0000 mg | Freq: Once | INTRAMUSCULAR | Status: AC
  Administered 2017-10-11: 80 mg via INTRAVENOUS
  Filled 2017-10-11: qty 2

## 2017-10-11 MED ORDER — ACETAMINOPHEN 10 MG/ML IV SOLN
1000.0000 mg | Freq: Four times a day (QID) | INTRAVENOUS | Status: AC
  Administered 2017-10-11: 1000 mg via INTRAVENOUS
  Filled 2017-10-11: qty 100

## 2017-10-11 MED ORDER — VITAL 1.5 CAL PO LIQD
1000.0000 mL | ORAL | Status: DC
  Administered 2017-10-12 – 2017-10-15 (×5): 1000 mL
  Filled 2017-10-11 (×9): qty 1000

## 2017-10-11 MED ORDER — PRO-STAT SUGAR FREE PO LIQD
30.0000 mL | Freq: Three times a day (TID) | ORAL | Status: DC
  Administered 2017-10-11 – 2017-10-23 (×36): 30 mL
  Filled 2017-10-11 (×36): qty 30

## 2017-10-11 MED ORDER — DEXMEDETOMIDINE HCL IN NACL 400 MCG/100ML IV SOLN
0.0000 ug/kg/h | INTRAVENOUS | Status: DC
  Administered 2017-10-11 (×2): 1.2 ug/kg/h via INTRAVENOUS
  Filled 2017-10-11 (×5): qty 100

## 2017-10-11 MED FILL — Heparin Sodium (Porcine) Inj 1000 Unit/ML: INTRAMUSCULAR | Qty: 2500 | Status: AC

## 2017-10-11 MED FILL — Dexmedetomidine HCl in NaCl 0.9% IV Soln 400 MCG/100ML: INTRAVENOUS | Qty: 100 | Status: AC

## 2017-10-11 MED FILL — Heparin Sodium (Porcine) Inj 1000 Unit/ML: INTRAMUSCULAR | Qty: 30 | Status: AC

## 2017-10-11 MED FILL — Potassium Chloride Inj 2 mEq/ML: INTRAVENOUS | Qty: 40 | Status: AC

## 2017-10-11 MED FILL — Magnesium Sulfate Inj 50%: INTRAMUSCULAR | Qty: 10 | Status: AC

## 2017-10-11 MED FILL — Cefuroxime Sodium For Inj 750 MG: INTRAMUSCULAR | Qty: 750 | Status: AC

## 2017-10-11 NOTE — Progress Notes (Signed)
  Echocardiogram 2D Echocardiogram has been performed.  Janalyn Harder 10/11/2017, 10:47 AM

## 2017-10-11 NOTE — Progress Notes (Signed)
2 Days Post-Op Procedure(s) (LRB): PLACEMENT OF IMPELLA 5.0 LEFT VENTRICULAR ASSIST DEVICE (N/A) TRANSESOPHAGEAL ECHOCARDIOGRAM (TEE) (N/A) Subjective: Patient sedated on ventilator with improved chest x-ray, less pulmonary edema Febrile 101-102 from probable systemic inflammatory response to shock and LVAD LVAD flows 4. 0-4 0.9 L/m. Mixed venous saturation satisfactory, PA pressures have reduced with LVAD, CVP still elevated but improved Patient diuresed over 4 L of urine. Decreased LV volume required slight adjustment of LVAD catheter performed at the bedside. Creatinine remains elevated at 2.75 Hepatic function adequate Receiving tube feeds gradually increasing to goal  Patient not ready for ventilator wean because of fever. On comparison antibiotics with cultures pending.  Objective: Vital signs in last 24 hours: Temp:  [101.1 F (38.4 C)-102.2 F (39 C)] 101.3 F (38.5 C) (02/01 0700) Pulse Rate:  [25-131] 103 (02/01 0738) Cardiac Rhythm: Atrial fibrillation (02/01 0400) Resp:  [10-23] 12 (02/01 0738) BP: (69-158)/(52-108) 89/64 (02/01 0738) SpO2:  [87 %-100 %] 100 % (02/01 0738) Arterial Line BP: (85-122)/(52-81) 94/67 (02/01 0700) FiO2 (%):  [40 %-50 %] 40 % (02/01 0738) Weight:  [180 lb 16 oz (82.1 kg)] 180 lb 16 oz (82.1 kg) (02/01 0415)  Hemodynamic parameters for last 24 hours: PAP: (24-42)/(11-28) 24/19 CVP:  [8 mmHg-19 mmHg] 13 mmHg CO:  [4 L/min-5.5 L/min] 5.5 L/min CI:  [2 L/min/m2-2.8 L/min/m2] 2.8 L/min/m2  Intake/Output from previous day: 01/31 0701 - 02/01 0700 In: 4601.4 [I.V.:2405.4; NG/GT:1080; IV Piggyback:1050] Out: 7820 [Urine:6960; Chest Tube:860] Intake/Output this shift: No intake/output data recorded.       Exam    General- sedated on ventilator and comfortable    Neck- no JVD, no cervical adenopathy palpable, no carotid bruit   Lungs- coarse breath sounds-right greater than left   Cor- regular rate and rhythm, no murmur , gallop  Abdomen- soft, non-tender   Extremities - warm, non-tender, 2+ edema   Neuro- oriented, appropriate, no focal weakness   Lab Results: Recent Labs    10/10/17 0250 10/11/17 0355  WBC 11.0* 9.5  HGB 11.5* 9.3*  HCT 34.1* 28.0*  PLT 113* 88*   BMET:  Recent Labs    10/10/17 1741 10/11/17 0355  NA 130* 132*  K 4.5 3.7  CL 89* 92*  CO2 29 28  GLUCOSE 126* 137*  BUN 67* 67*  CREATININE 2.76* 2.72*  CALCIUM 7.6* 7.7*    PT/INR: No results for input(s): LABPROT, INR in the last 72 hours. ABG    Component Value Date/Time   PHART 7.480 (H) 10/11/2017 0324   HCO3 33.3 (H) 10/11/2017 0324   TCO2 37 (H) 10/10/2017 1550   ACIDBASEDEF 8.0 (H) 09/28/2017 2308   O2SAT 63.1 10/11/2017 0410   CBG (last 3)  Recent Labs    10/10/17 2028 10/10/17 2257 10/11/17 0350  GLUCAP 144* 150* 152*    Assessment/Plan: S/P Procedure(s) (LRB): PLACEMENT OF IMPELLA 5.0 LEFT VENTRICULAR ASSIST DEVICE (N/A) TRANSESOPHAGEAL ECHOCARDIOGRAM (TEE) (N/A) Continue percutaneous temporary LVAD support Patient's condition and plan of care discussed with wife in ICU. Consider bedside bronchoscopy for pulmonary toilet and retrieval of culture material from right lower lobe  LOS: 11 days    Bryan Harmon 10/11/2017

## 2017-10-11 NOTE — Progress Notes (Addendum)
Patient ID: Bryan Harmon, male   DOB: Mar 24, 1951, 67 y.o.   MRN: [id number]     Advanced Heart Failure Rounding Note  PCP:  Primary Cardiologist: Aundra Dubin   Subjective:    Impella 5.0 placed in the OR 1/30.  Remains intubated post-op.  Currently on norepinephrine 24, milrinone 0.5, amiodarone 30 mg/hr. SBP 100s-110s today.  Increased UOP, I/O net negative > 3000 cc.  HR in 90s-100s afib on amiodarone 30 mg/hr.   Chest tube placed on right 1/31.   Impella placement alarms overnight after significant movement during day shift for chest tube, new CVL, etc.  Responsive to albumin, decrease to P8, and cutting back Lasix.  This morning, pulled back slightly on device with improvement in waveform and flow up to 4.7. Turned back up from P8 => P9.   LDH 443 => 517 => 585.  Plts lower at 113 => 88.   Impella 5.0  P9 Flow 4.7 L/min  Swan: CVP 13 PA 30/23 CI 2.8 Co-ox 63%  Respiratory virus screen: Parainfluenza virus.  Bld CX- Strep pneumoniae--> IV rocephin initially.  CXR with persistent RLL PNA.  CT chest with multifocal PNA 1/29.  Persistent fever, infiltrates, and elevated procalcitonin => abx switched to meropenem + vancomycin 1/30. He was febrile overnight to 102 though WBCs not markedly elevated.  Most recent cultures remain negative.   Echo: Severe LV dilation, EF 15-20%, severely dilated RV with severely decreased RV systolic function, moderate-severe MR, moderate-severe TR, cannot rule out LV apical thrombus.  Repeat echo with Definity showed definite LV apical thrombus.   TEE (1/28): EF 10-15%, diffuse hypokinesis, small LV apical thrombus, mildly dilated RV with moderately decreased systolic function, no LAA thrombus.    [facility] Procedural Findings (1/29): Hemodynamics (mmHg) RA mean 11 RV 37/11 PA 41/22, mean 31 PCWP mean 24 Oxygen saturations: PA 40% AO 92% Cardiac Output (Fick) 3.1  Cardiac Index (Fick) 1.54 Cardiac Output (Thermo) 3.25 Cardiac Index (Thermo) 1.62    PVR 2.15 WU  Objective:   Weight Range: 180 lb 16 oz (82.1 kg) Body mass index is 25.97 kg/m.   Vital Signs:   Temp:  [101.1 F (38.4 C)-102.2 F (39 C)] 101.7 F (38.7 C) (02/01 0845) Pulse Rate:  [25-131] 59 (02/01 0845) Resp:  [10-23] 13 (02/01 0845) BP: (69-158)/(52-108) 84/63 (02/01 0800) SpO2:  [87 %-100 %] 100 % (02/01 0845) Arterial Line BP: (85-122)/(52-81) 102/70 (02/01 0845) FiO2 (%):  [40 %-50 %] 40 % (02/01 0800) Weight:  [180 lb 16 oz (82.1 kg)] 180 lb 16 oz (82.1 kg) (02/01 0415) Last BM Date: 09/29/2017  Weight change: Filed Weights   09/23/2017 0500 10/10/17 0427 10/11/17 0415  Weight: 183 lb 13.8 oz (83.4 kg) 190 lb 4.1 oz (86.3 kg) 180 lb 16 oz (82.1 kg)    Intake/Output:   Intake/Output Summary (Last 24 hours) at 10/11/2017 0853 Last data filed at 10/11/2017 0800 Gross per 24 hour  Intake 4797.13 ml  Output 7095 ml  Net -2297.87 ml      Physical Exam  CVP 13 General: NAD Neck: JVP 10 cm, no thyromegaly or thyroid nodule.  Lungs: Decreased BS at bases, chest tube on right.  CV: Lateral PMI.  Heart regular S1/S2, no S3/S4, 1/6 HSM apex.  1+ edema to knees bilaterally.   Abdomen: Soft, nontender, no hepatosplenomegaly, no distention.  Skin: Intact without lesions or rashes.  Neurologic: Alert and oriented x 3.  Psych: Normal affect. Extremities: No clubbing or cyanosis.  HEENT: Normal.  Telemetry   Afib 90s-100s, personally reviewed.   EKG    No new tracings.    Labs    CBC Recent Labs    10/10/17 0250 10/11/17 0355  WBC 11.0* 9.5  NEUTROABS 9.5* 7.9*  HGB 11.5* 9.3*  HCT 34.1* 28.0*  MCV 90.9 91.5  PLT 113* 88*   Basic Metabolic Panel Recent Labs    10/10/17 1741 10/11/17 0355  NA 130* 132*  K 4.5 3.7  CL 89* 92*  CO2 29 28  GLUCOSE 126* 137*  BUN 67* 67*  CREATININE 2.76* 2.72*  CALCIUM 7.6* 7.7*  MG 2.1 2.0  PHOS 3.0 3.2   Liver Function Tests Recent Labs    10/10/17 0250 10/11/17 0355  AST 103* 78*  ALT  176* 113*  ALKPHOS 111 87  BILITOT 1.2 1.2  PROT 5.4* 5.0*  ALBUMIN 2.0* 2.2*   No results for input(s): LIPASE, AMYLASE in the last 72 hours. Cardiac Enzymes No results for input(s): CKTOTAL, CKMB, CKMBINDEX, TROPONINI in the last 72 hours.  BNP: BNP (last 3 results) Recent Labs    12/13/16 0949 02/11/17 1049 10/10/2017 1218  BNP 369.2* 562.7* 2,485.9*    ProBNP (last 3 results) No results for input(s): PROBNP in the last 8760 hours.   D-Dimer No results for input(s): DDIMER in the last 72 hours. Hemoglobin A1C No results for input(s): HGBA1C in the last 72 hours. Fasting Lipid Panel No results for input(s): CHOL, HDL, LDLCALC, TRIG, CHOLHDL, LDLDIRECT in the last 72 hours. Thyroid Function Tests No results for input(s): TSH, T4TOTAL, T3FREE, THYROIDAB in the last 72 hours.  Invalid input(s): FREET3  Other results:   Imaging    Dg Chest Port 1 View  Result Date: 10/11/2017 CLINICAL DATA:  CHF EXAM: PORTABLE CHEST 1 VIEW COMPARISON:  Chest radiograph from one day prior. FINDINGS: Right internal jugular Swan-Ganz catheter terminates over the right lower lobe. Right apical chest tube is in place. Endotracheal tube tip is 5.1 cm above the carina. Right axillary Impella terminates over the left ventricular apex. Skin staples overlie the right axilla. Left subclavian central venous catheter terminates over the upper third of the superior vena cava. Enteric tube enters stomach with the tip not seen on this image. Stable cardiomediastinal silhouette with mild cardiomegaly. No pneumothorax. Probable trace bilateral pleural effusions, stable. Mild pulmonary edema appears stable. Stable bibasilar atelectasis. IMPRESSION: 1. Right internal jugular Swan-Ganz terminates over the right lower lung lobe, recommend retracting. 2. Otherwise well-positioned lines and tubes.  No pneumothorax. 3. Stable mild congestive heart failure, trace bilateral pleural effusions and bibasilar atelectasis.  These results will be called to the ordering clinician or representative by the Radiologist Assistant, and communication documented in the PACS or zVision Dashboard. Electronically Signed   By: Ilona Sorrel M.D.   On: 10/11/2017 08:14   Dg Chest Port 1 View  Result Date: 10/10/2017 CLINICAL DATA:  Central line infiltration. EXAM: PORTABLE CHEST 1 VIEW COMPARISON:  10/10/2017. FINDINGS: L interim removal of left IJ line. Eft subclavian central line noted with tip over superior vena cava. Endotracheal tube, feeding tube, Swan-Ganz catheter, right chest tube, Impella device in stable position. Stable cardiomegaly. Low lung volumes with mild basilar atelectasis/infiltrates again noted. Small bilateral pleural effusions again noted. No pneumothorax. Surgical staples and sutures right chest. IMPRESSION: 1. Interim removal of left IJ line. Left subclavian line now noted, its tip is over the superior vena cava. Remaining lines and tubes in stable position. No pneumothorax. 2. Stable cardiomegaly. Stable  bibasilar atelectasis. Small bilateral pleural effusions cannot be excluded. Electronically Signed   By: Marcello Moores  Register   On: 10/10/2017 15:55   Dg Chest Port 1 View  Result Date: 10/10/2017 CLINICAL DATA:  Follow-up chest tube placement EXAM: PORTABLE CHEST 1 VIEW COMPARISON:  10/10/2017 FINDINGS: Cardiac shadow is prominent. An Impella catheter is again identified and stable from the right arm approach. Endotracheal tube and feeding catheter are noted in satisfactory position. Right chest tube is now noted in place. No pneumothorax is identified. Swan-Ganz catheter is noted within the right pulmonary artery. Bibasilar atelectasis and right pleural effusion are seen. IMPRESSION: Bibasilar changes. Tubes and lines as described. Right chest tube is now seen without evidence of pneumothorax. Electronically Signed   By: Inez Catalina M.D.   On: 10/10/2017 12:52     Medications:     Scheduled Medications: .  chlorhexidine gluconate (MEDLINE KIT)  15 mL Mouth Rinse BID  . Chlorhexidine Gluconate Cloth  6 each Topical Daily  . feeding supplement (PRO-STAT SUGAR FREE 64)  30 mL Per Tube Daily  . insulin aspart  2-6 Units Subcutaneous Q4H  . mouth rinse  15 mL Mouth Rinse 10 times per day  . mupirocin ointment  1 application Nasal BID  . potassium chloride  20 mEq Oral QID  . silver sulfADIAZINE   Topical BID  . sodium chloride flush  3 mL Intravenous Q12H    Infusions: . sodium chloride 250 mL (10/11/17 0800)  . amiodarone 30 mg/hr (10/11/17 0800)  . dexmedetomidine (PRECEDEX) IV infusion 1.001 mcg/kg/hr (10/11/17 0800)  . dextrose 5 % Impella 5.0 Purge solution    . famotidine (PEPCID) IV Stopped (10/10/17 1030)  . feeding supplement (VITAL AF 1.2 CAL) 1,000 mL (10/11/17 0800)  . furosemide (LASIX) infusion 4 mg/hr (10/11/17 0800)  . impella catheter heparin 50 unit/mL in dextrose 5%    . heparin 1,400 Units/hr (10/11/17 0800)  . meropenem (MERREM) IV Stopped (10/11/17 8841)  . milrinone 0.5 mcg/kg/min (10/11/17 0800)  . norepinephrine (LEVOPHED) Adult infusion 22 mcg/min (10/11/17 0800)  . vancomycin Stopped (10/10/17 2140)    PRN Medications: sodium chloride, acetaminophen (TYLENOL) oral liquid 160 mg/5 mL, albuterol, fentaNYL (SUBLIMAZE) injection, fentaNYL (SUBLIMAZE) injection, magic mouthwash, midazolam, midazolam, morphine injection, ondansetron (ZOFRAN) IV, sodium chloride flush, sodium chloride flush    Patient Profile  Mr [name] is a 67 year old with a history of PAF S/P DC-CV 11/2016, s/p bilateral inguinal hernia repair 11/2017, PVCs, HTN, NICM, chronic systolic heart failure.   Sent from Urgent Care with A fib RVR. Acutely SOB on arrival   Assessment/Plan   1. Acute hypoxemic respiratory failure: RLL PNA, Strep pneumoniae in blood cultures. Respiratory cultures with parainfluenzae virus. Also acute/chronic systolic CHF.Now intubated s/p Impella 5.0 placement,  multifocal PNA on CXR and CT chest.  -Antibiotics broadened from ceftriaxone to vancomycin/meropenem on 1/30.  -CVP 13. Continue IV diuresis, being careful not to lower volume too rapidly with Impella.   - Right chest tube placed 1/31.  2.Acute on chronic systolic CHF: Nonischemic cardiomyopathy. 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 Etiology of his CHF is not clear =>no definite inciting event. He has a history of HTN but doubt this was the only trigger. 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 This would ideally be confirmed by cMRI, but he has not wanted an MRI (concerned about side effects). He also has frequent PVCs, 21% total on last holter in 4/18  which is a risk for fall in EF.No family history of CMP. Cannot rule out viral myocarditis. SPEP negative. With medical management, he initially felt a lot better. However, he quit all his meds in early 2018 with recurrence of NYHA III symptoms and onset of atrial fibrillation.He had TEE-DCCV in 3/18.TEE showed that EF remained25%. He quit his meds again in 4/18 and apparently did not restart them when I asked him to in 6/18. No meds probably since 4/18. Echo this admission with EF 15-20%, severe RV dysfunction, LV apical thrombus. RV has looked better on followup echoes after Impella placement.  Attempted DCCV on 1/28 failed likely due to pressors/inotropes.  [place] 1/29 with low cardiac output => Impella 5.0 placed 1/30.  Cardiac index improved this morning, CI 2.8 from [place] with co-ox 63%. Impella pulled back slightly this morning with significant improvement in waveform, now at P9 with good flow.  Milrinone continues at 0.5 for RV support and norepinephrine is at 22.  Good UOP yesterday, creatinine stable.   - With improved CO, will back off to 0.375 on milrinone.  Slowly titrating down norepinephrine.  -Lasix 6 mg/hr today, want to see net negative but not  markedly negative likely yesterday.  Avoid dropping CVP much < 10 with Impella in place.  - Some rise in LDH and fall in platelets again this morning.  Impella repositioned today, will check position on echo later this morning.  3. 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 It is possible that the PVCs contribute to his cardiomyopathy. -Onamiodarone. No change. 4. Atrial fibrillation:Persistent, now with RVR. HR up to 140sinitially, now 110s on amiodarone gtt at30. Not sure how long he has been in atrial fibrillation with marked RVR, but this may have triggered his CHF and AKI due to worsening of cardiac output.  HR now better controlled.  -Heparin gtt ongoing. - Continue amiodarone gtt at 30 mg/hr.   - Failed TEE-guided DCCV on 1/28 in setting of vasoactive meds.  If we can wean down milrinone/norepinephrine, will re-attempt DCCV in future.   5. AKI: Creatinine 1.08 in 6/18, he has not been on any meds. Suspect cardiorenal syndrome with afib/RVR and fall in cardiac output.He was initially hyperkalemic and acidotic.Creatinine stable at 2.72 today.  Hopefully will improve now that cardiac output is better with Impella in place.  6. PP:JKDTO pneumo PNA as well as parainfluenza virus.CT chest 1/29 with multifocal PNA. Fever to 102 overnight, abx changed on 1/30 to vancomycin/meropenem.   - Repeat cultures negative so far.  - ?If bronchoscopy would be helpful for cultures => d/w CCM.  7. Elevated LFTs: Suspect shock liver type picture. - AST/ALT > 1000 at admission but have trended down steadily.  8. LV thrombus: Noted on TTE and TEE, small.  - Heparin gtt.    9. Hyponatremia: Hypervolemic hyponatremia. Sodium improved.  10. Malnutrition: tube feeds.     CRITICAL CARE Performed by: Loralie Champagne  Total critical care time: 40 minutes  Critical care time was exclusive of separately billable procedures and treating other patients.  Critical care was necessary to treat  or prevent imminent or life-threatening deterioration.  Critical care was time spent personally by me on the following activities: development of treatment plan with patient and/or surrogate as well as nursing, discussions with consultants, evaluation of patient's response to treatment, examination of patient, obtaining history from patient or surrogate, ordering and performing treatments and interventions, ordering and review of laboratory studies, ordering and review of radiographic  studies, pulse oximetry and re-evaluation of patient's condition.  Loralie Champagne 10/11/2017 8:53 AM   Impella repositioned under echo guidance, appeared to have migrated deeper than yesterday.  Left at 3.7 cm.  Good flow, no alarms.    Loralie Champagne 10/11/2017 10:39 AM

## 2017-10-11 NOTE — Plan of Care (Signed)
  Progressing Cardiac: Ability to achieve and maintain adequate cardiopulmonary perfusion will improve 10/11/2017 2117 - Progressing by Derek Mound, RN Note Within parameters on current gtts   Not Progressing Education: Ability to demonstrate management of disease process will improve 10/11/2017 2117 - Not Progressing by Derek Mound, RN Note Pt remains intubated and sedated. Education ongoing with the family Ability to verbalize understanding of medication therapies will improve 10/11/2017 2117 - Not Progressing by Derek Mound, RN Note See above Activity: Capacity to carry out activities will improve 10/11/2017 2117 - Not Progressing by Derek Mound, RN

## 2017-10-11 NOTE — Progress Notes (Signed)
PULMONARY / CRITICAL CARE MEDICINE   Name: Bryan Harmon MRN: [id number] DOB: 11/15/1950    ADMISSION DATE:  09/28/2017  REFERRING MD:  Nils Pyle  CHIEF COMPLAINT:  Fatigue  HISTORY OF PRESENT ILLNESS:   67 y/o male with non-ischemic cardiomyopathy and afib admitted with decompensated heart failure.  Required Impella, vent.    SUBJECTIVE:  Tmax 101.8.  RN reports Impella repositioned this am.  Pt remains on milrinone, amiodarone, lasix, levophed, heparin and precedex gtt's.  SvO2 63%, LDH 585.  Last CI 2.8, CO 5.69.  Neg 3.2L in last 24 hours / 10.7 since admit   VITAL SIGNS: BP (!) 84/63 (BP Location: Right Arm)   Pulse (!) 48   Temp (!) 101.8 F (38.8 C)   Resp 10   Ht _0  (1.778 m)   Wt 180 lb 16 oz (82.1 kg)   SpO2 100%   BMI 25.97 kg/m   HEMODYNAMICS: PAP: (24-42)/(11-28) 29/20 CVP:  [6 mmHg-19 mmHg] 7 mmHg CO:  [4 L/min-5.6 L/min] 5.6 L/min CI:  [2 L/min/m2-2.8 L/min/m2] 2.8 L/min/m2  VENTILATOR SETTINGS: Vent Mode: PCV FiO2 (%):  [40 %-50 %] 40 % Set Rate:  [8 bmp-14 bmp] 8 bmp Vt Set:  [580 mL] 580 mL PEEP:  [5 cmH20] 5 cmH20 Plateau Pressure:  [14 cmH20-17 cmH20] 14 cmH20  INTAKE / OUTPUT: I/O last 3 completed shifts: In: 6447.8 [I.V.:3230.3; Other:107.5; NG/GT:1560; IV Piggyback:1550] Out: 9920 [Urine:9060; Chest Tube:860]  PHYSICAL EXAMINATION: General: critically ill appearing male on vent in NAD HEENT: MM pink/moist, R IJ swan ganz, ETT, NGT Neuro: sedate CV: s1s2 rrr, no m/r/g PULM: even/non-labored, lungs bilaterally coarse, R>L, diminished RLL, R chest tube in place, to 20 cm suction, with bloody drainage NW:GNFA, non-tender, bsx4 active  Extremities: warm/dry, trace generalized edema  Skin: no rashes or lesions  LABS:  BMET Recent Labs  Lab 10/10/17 0250 10/10/17 1741 10/11/17 0355  NA 126* 130* 132*  K 4.4 4.5 3.7  CL 87* 89* 92*  CO2 _1 BUN 66* 67* 67*  CREATININE 2.84* 2.76* 2.72*  GLUCOSE 110* 126* 137*     Electrolytes Recent Labs  Lab 10/10/17 0250 10/10/17 1741 10/11/17 0355  CALCIUM 7.8* 7.6* 7.7*  MG 2.2 2.1 2.0  PHOS 3.5 3.0 3.2    CBC Recent Labs  Lab 10/06/2017 0430  09/19/2017 1535 10/10/17 0250 10/11/17 0355  WBC 8.6  --   --  11.0* 9.5  HGB 12.4*   < > 12.6* 11.5* 9.3*  HCT 36.0*   < > 37.0* 34.1* 28.0*  PLT 163  --   --  113* 88*   < > = values in this interval not displayed.    Coag's Recent Labs  Lab 10/05/2017 0005 10/10/17 0250  APTT  --  37*  INR 1.33  --     Sepsis Markers Recent Labs  Lab 09/28/2017 0430 10/10/17 0926 10/11/17 0355  PROCALCITON 6.00 8.49 6.65    ABG Recent Labs  Lab 10/10/17 0350 10/10/17 1550 10/11/17 0324  PHART 7.470* 7.492* 7.480*  PCO2ART 43.9 47.1 45.5  PO2ART 171* 152.0* 147*    Liver Enzymes Recent Labs  Lab 09/29/2017 0005 10/10/17 0250 10/11/17 0355  AST 112* 103* 78*  ALT 239* 176* 113*  ALKPHOS 137* 111 87  BILITOT 0.8 1.2 1.2  ALBUMIN 2.2* 2.0* 2.2*    Cardiac Enzymes No results for input(s): TROPONINI, PROBNP in the last 168 hours.  Glucose Recent Labs  Lab 10/10/17 0931 10/10/17  1229 10/10/17 1514 10/10/17 2028 10/10/17 2257 10/11/17 0350  GLUCAP 138* 117* 110* 144* 150* 152*    Imaging Dg Chest Port 1 View  Result Date: 10/11/2017 CLINICAL DATA:  CHF EXAM: PORTABLE CHEST 1 VIEW COMPARISON:  Chest radiograph from one day prior. FINDINGS: Right internal jugular Swan-Ganz catheter terminates over the right lower lobe. Right apical chest tube is in place. Endotracheal tube tip is 5.1 cm above the carina. Right axillary Impella terminates over the left ventricular apex. Skin staples overlie the right axilla. Left subclavian central venous catheter terminates over the upper third of the superior vena cava. Enteric tube enters stomach with the tip not seen on this image. Stable cardiomediastinal silhouette with mild cardiomegaly. No pneumothorax. Probable trace bilateral pleural effusions,  stable. Mild pulmonary edema appears stable. Stable bibasilar atelectasis. IMPRESSION: 1. Right internal jugular Swan-Ganz terminates over the right lower lung lobe, recommend retracting. 2. Otherwise well-positioned lines and tubes.  No pneumothorax. 3. Stable mild congestive heart failure, trace bilateral pleural effusions and bibasilar atelectasis. These results will be called to the ordering clinician or representative by the Radiologist Assistant, and communication documented in the PACS or zVision Dashboard. Electronically Signed   By: Ilona Sorrel M.D.   On: 10/11/2017 08:14   Dg Chest Port 1 View  Result Date: 10/10/2017 CLINICAL DATA:  Central line infiltration. EXAM: PORTABLE CHEST 1 VIEW COMPARISON:  10/10/2017. FINDINGS: L interim removal of left IJ line. Eft subclavian central line noted with tip over superior vena cava. Endotracheal tube, feeding tube, Swan-Ganz catheter, right chest tube, Impella device in stable position. Stable cardiomegaly. Low lung volumes with mild basilar atelectasis/infiltrates again noted. Small bilateral pleural effusions again noted. No pneumothorax. Surgical staples and sutures right chest. IMPRESSION: 1. Interim removal of left IJ line. Left subclavian line now noted, its tip is over the superior vena cava. Remaining lines and tubes in stable position. No pneumothorax. 2. Stable cardiomegaly. Stable bibasilar atelectasis. Small bilateral pleural effusions cannot be excluded. Electronically Signed   By: Marcello Moores  Register   On: 10/10/2017 15:55   Dg Chest Port 1 View  Result Date: 10/10/2017 CLINICAL DATA:  Follow-up chest tube placement EXAM: PORTABLE CHEST 1 VIEW COMPARISON:  10/10/2017 FINDINGS: Cardiac shadow is prominent. An Impella catheter is again identified and stable from the right arm approach. Endotracheal tube and feeding catheter are noted in satisfactory position. Right chest tube is now noted in place. No pneumothorax is identified. Swan-Ganz catheter  is noted within the right pulmonary artery. Bibasilar atelectasis and right pleural effusion are seen. IMPRESSION: Bibasilar changes. Tubes and lines as described. Right chest tube is now seen without evidence of pneumothorax. Electronically Signed   By: Inez Catalina M.D.   On: 10/10/2017 12:52    Lines/ Tubes 1/30 L IJ CVL > 1/31 1/30 R IJ swan > 1/30 R sub clav impella > 1/30 L radial arterial line > 1/30 ETT >  1/31 L [place] TLC >   Cultures: 1/21 Blood >> Strep Pneumoniae >> pan sens 1/21 Resp Viral Panel >> + Parainfluenza Virus 3 1/28 blood >  1/30 blood >  1/31 resp culture > 1/31 tracheal aspirate >   ABX:  Rocephin 1/21 >> 1/29 Doxy 1/21 >> 1/23 Vanc 1/30 >>  Mero 1/30 >>   Studies: 10/01/2017>> Echo EF 15-20%, Apex ? PAP muscle vs. Thrombus, LV severely dilated, moderate concentric hypertrophy, systolic function normal, unable to evaluate LV diastolic function due to atrial fibrillation. + moderate spontaneous echo contrast, indicative  of stasis Impression 1/23 Echo > Severely dilated LV with severe LV dysfunction globally with EF 15-20%. Severely dialted RV with severe RV dysfunction. Moderate to severe MR with ERO 0.33cm2 and MR volume 69m. Moderate tosevere TR with moderate pulmonary HTN. Moderately thickened and calcified AV leaflets with mild MR, mildly dilated aortic root, massive biatrial enlargement. Cannot rule out LV thrombus. Thereis significant spontaneous echo contrast in LV c/w sluggish blood flow. The right ventricular systolic pressure was increasedconsistent with moderate pulmonary hypertension. 1/29 CT chest > dense bilateral lower lobe consolidation R>L 1/30 TEE >> LVEF 20-25%, impella in place, poor overall contractility but LV improved   DISCUSSION: 67y/o male with ischemic cardiomyopathy admitted with decompensated systolic heart failure in the setting of strep pneumo pneumonia and bacteremia.  He has acute on chronic kidney failure, cardiogenic  shock. New fever on 1/30 of undetermined cause.   ASSESSMENT / PLAN:  PULMONARY A: Acute respiratory failure with hypoxemia CAP - RLL  Acute pulmonary edema Bilateral pleural effusions  P:   PCV - PC 10, PEEP 5, Rate 8, 40%  ABG reviewed, continue above  VAP prevention measures > position left side down if possible  Daily WUA / SBT  Lasix gtt for negative balance as renal function / BP permit  No plan for extubation 2/1  See ID   CARDIOVASCULAR A:  Acute decompensated cardiomyopathy > severe biventricular failure, LVEF ~ 5% S/p Impella 1/30 Cardiogenic Shock  Medical Non-Compliance - stopped taking all meds prior to admit for CHF, ? Long term plan P:  ICU monitoring  Post-op care, inotropes per CVTS / CHF service  Amiodarone gtt  RENAL A:   AKI Hypokalemia/hypervolemic P:   Trend BMP / urinary output Replace electrolytes as indicated Avoid nephrotoxic agents, ensure adequate renal perfusion  GASTROINTESTINAL A:   No acute issues P:   TF per Nutrition  Pepcid IV for SUP   HEMATOLOGIC A:   Mild anemia, thrombocytopenia without bleeding P:  Trend CBC Monitor for bleeding > small amt bloody secretions from subglottic   INFECTIOUS A:   Strep Pneumonia Bacteremia - 1/21 Severe CAP HCAP?  Fever 1/30 - new, consider RLL, sinusitis with NGT, multiple lines Parainfluenza virus pneumonia P:   Plan for bronch 2/1 am to obtain culture from RLL  Continue abx as above, consider narrowing if no further fever, cultures remain negative  CXR will take weeks / months to recover  ENDOCRINE A:   Hyperglycemia - CBG 140-150's P:   SSI   NEUROLOGIC A:   Sedation Need / Mechanical Ventilation  P:   RASS goal: -1  Sedation protocol > continue precedex, PRN fentanyl, versed   FAMILY  - Updates:  No family at bedside on am 2/1 rounding.  Plan of care discussed with RN, Dr. VPrescott Gum    BNoe Gens NP-C [facility] Pgr: [phone] or if no  answer [phone]/09/2017, 9:08 AM  Attending Note:  67year old male with history of non-compliance with CHF medications presenting with an EF of 5% and respiratory failure.  On exam, diffuse crackles noted.  I reviewed CXR myself, ETT ok and pulmonary edema noted.  Will continue full vent support for now.  Plan bronch today.  BAL done.  Full vent support.  Pressor and impella as ordered.  Amio drip as ordered.  PCCM will continue to follow.  The patient is critically ill with multiple organ systems failure and requires high complexity decision making for assessment  and support, frequent evaluation and titration of therapies, application of advanced monitoring technologies and extensive interpretation of multiple databases.   Critical Care Time devoted to patient care services described in this note is  35  Minutes. This time reflects time of care of this signee Dr Jennet Maduro. This critical care time does not reflect procedure time, or teaching time or supervisory time of PA/NP/Med student/Med Resident etc but could involve care discussion time.  Rush Farmer, M.D. [facility] Pulmonary/Critical Care Medicine. Pager: [phone]. After hours pager: [phone].

## 2017-10-11 NOTE — Procedures (Signed)
Bronchoscopy Procedure Note Bryan Harmon [id number] 06/11/1951  Procedure: Bronchoscopy Indications: Obtain specimens for culture and/or other diagnostic studies and Remove secretions  Procedure Details Consent: Risks of procedure as well as the alternatives and risks of each were explained to the (patient/caregiver).  Consent for procedure obtained. Time Out: Verified patient identification, verified procedure, site/side was marked, verified correct patient position, special equipment/implants available, medications/allergies/relevent history reviewed, required imaging and test results available.  Performed  In preparation for procedure, patient was given 100% FiO2 and bronchoscope lubricated. Sedation: Benzodiazepines and Fentanyl  Airway entered and the following bronchi were examined: RUL, RML, RLL, LUL, LLL and Bronchi.   No purulent secretion, blood secretion throughout all lung fields Bronchoscope removed.    Evaluation Hemodynamic Status: BP stable throughout; O2 sats: stable throughout Patient's Current Condition: stable Specimens:  Sent serosanguinous fluid Complications: No apparent complications Patient did tolerate procedure well.   Bryan Harmon 10/11/2017

## 2017-10-11 NOTE — Progress Notes (Signed)
Nutrition Follow-up   Late Entry for 10-27-2016  DOCUMENTATION CODES:   Non-severe (moderate) malnutrition in context of chronic illness  INTERVENTION:   No BM x 1 week, recommend addition of bowel regimen  Tube Feeding:  Change formula to Vital 1.5 @ 55 ml/hr Pro-Stat 30 mL TID Provide 135 g of protein, 2280 kcals, 1003 mL of free water    NUTRITION DIAGNOSIS:   Moderate Malnutrition related to chronic illness(CHF) as evidenced by mild fat depletion, severe fat depletion.  Being addressed via TF  GOAL:   Provide needs based on ASPEN/SCCM guidelines  Progressing  MONITOR:   TF tolerance, Weight trends, Vent status, Skin, Labs  REASON FOR ASSESSMENT:   Malnutrition Screening Tool    ASSESSMENT:   67 yo male admitted with acute respiratory failure with RLL pneumonia as well as acute on chronic CHF, AKI. Pt with hx of HTN, CHF  Febrile, no plans to wean today Plan for bronch today  Patient is currently intubated on ventilator support MV: 14 L/min Temp (24hrs), Avg:101.6 F (38.7 C), Min:101.1 F (38.4 C), Max:102.2 F (39 C)  Vital AF 1.2 infusing @ 50 ml/hr via Cortrak tube  No BM x 1 week, no bowel regimen at present  UOP 7 L in 24 hours, 1 L thus far this shift Chest tube 860 mL in 24 hours  Weight fluctuated a lot this admission; using EDW of 79.7 kg at this time for calculations  Labs: sodium 132, potassium wdl, phosphorus wdl, magnesium wdl Meds: precedex, milrinone, lasix drip, levophed  Diet Order:  Diet NPO time specified  EDUCATION NEEDS:   Not appropriate for education at this time(Needs education on follow-up)  Skin:  Skin Assessment: Reviewed RN Assessment(no pressure ulcers noted)  Last BM:  1/24  Height:   Ht Readings from Last 1 Encounters:  10/01/17 5\' 10"  (1.778 m)    Weight:   Wt Readings from Last 1 Encounters:  10/27/17 180 lb 16 oz (82.1 kg)    Ideal Body Weight:  75.5 kg  BMI:  Body mass index is 25.97  kg/m.  Estimated Nutritional Needs:   Kcal:  2263 kcals  Protein:  120-160 g  Fluid:  </= 1.8 L   Romelle Starcher MS, RD, LDN, CNSC [phone] Pager  [phone] Weekend/On-Call Pager

## 2017-10-11 NOTE — Progress Notes (Signed)
ANTICOAGULATION CONSULT NOTE   Pharmacy Consult for heparin while on Impella 5.0 Indication: Impella 5.0 in setting of LV thrombus and atrial fibrillation  No Known Allergies  Patient Measurements: Height: 5\' 10"  (177.8 cm) Weight: 180 lb 16 oz (82.1 kg) IBW/kg (Calculated) : 73 Heparin Dosing Weight: 84.1 kg  Vital Signs: Temp: 103.1 F (39.5 C) (02/01 1330) BP: 88/72 (02/01 1200) Pulse Rate: 86 (02/01 1315)  Labs: Recent Labs    10/08/2017 0430  09/30/2017 1158 09/26/2017 1535 10/10/17 0250 10/10/17 1741 10/11/17 0355 10/11/17 0356  HGB 12.4*   < >  --  12.6* 11.5*  --  9.3*  --   HCT 36.0*   < >  --  37.0* 34.1*  --  28.0*  --   PLT 163  --   --   --  113*  --  88*  --   APTT  --   --   --   --  37*  --   --   --   HEPARINUNFRC  --   --  0.10*  --  <0.10*  --   --  <0.10*  CREATININE 2.38*   < >  --  2.90* 2.84* 2.76* 2.72*  --    < > = values in this interval not displayed.    Estimated Creatinine Clearance: 28 mL/min (A) (by C-G formula based on SCr of 2.72 mg/dL (H)).  Assessment: 66yom with LV apical thrombus previously on heparin drip. ECHO showing again small LV apical thrombus but no LA appendage thrombus. He is also in atrial fibrillation.   Impella 5.0 was placed 1/31. Heparin 50 units/ml in purge solution was started 1/31 PM and  systemic heparin started yesterday morning for ACT goal 160-180 sec.  Purge flow rate 4.91ml/hr heparin = 240 units/hr ACTs had been running in the 150s. Peripheral heparin increased to 1600 units/hr today and now ACTs 164. Hgb 11.5 > 9.3, platelets continue to trend down 113 > 88. Bleeding from chest tube.   *New max heparin rate 1600 units/hr per Dr. Maren Beach. Order adjusted in Epic.   Plan: 1) Continue to monitor ACTs along with nursing to provide any assistance.  Louie Casa, PharmD, BCPS 10/11/2017 1:40 PM

## 2017-10-11 DEATH — deceased

## 2017-10-12 ENCOUNTER — Inpatient Hospital Stay (HOSPITAL_COMMUNITY): Payer: Medicare Other

## 2017-10-12 LAB — CBC WITH DIFFERENTIAL/PLATELET
Basophils Absolute: 0 10*3/uL (ref 0.0–0.1)
Basophils Relative: 0 %
Eosinophils Absolute: 0 10*3/uL (ref 0.0–0.7)
Eosinophils Relative: 0 %
HCT: 23 % — ABNORMAL LOW (ref 39.0–52.0)
Hemoglobin: 7.9 g/dL — ABNORMAL LOW (ref 13.0–17.0)
Lymphocytes Relative: 7 %
Lymphs Abs: 0.9 10*3/uL (ref 0.7–4.0)
MCH: 31.5 pg (ref 26.0–34.0)
MCHC: 34.3 g/dL (ref 30.0–36.0)
MCV: 91.6 fL (ref 78.0–100.0)
Monocytes Absolute: 0.5 10*3/uL (ref 0.1–1.0)
Monocytes Relative: 4 %
Neutro Abs: 11.3 10*3/uL — ABNORMAL HIGH (ref 1.7–7.7)
Neutrophils Relative %: 89 %
Platelets: 120 10*3/uL — ABNORMAL LOW (ref 150–400)
RBC: 2.51 MIL/uL — ABNORMAL LOW (ref 4.22–5.81)
RDW: 14.4 % (ref 11.5–15.5)
WBC: 12.7 10*3/uL — ABNORMAL HIGH (ref 4.0–10.5)

## 2017-10-12 LAB — POCT I-STAT, CHEM 8
BUN: 83 mg/dL — ABNORMAL HIGH (ref 6–20)
CALCIUM ION: 1.01 mmol/L — AB (ref 1.15–1.40)
CHLORIDE: 91 mmol/L — AB (ref 101–111)
Creatinine, Ser: 2.5 mg/dL — ABNORMAL HIGH (ref 0.61–1.24)
Glucose, Bld: 161 mg/dL — ABNORMAL HIGH (ref 65–99)
HCT: 24 % — ABNORMAL LOW (ref 39.0–52.0)
Hemoglobin: 8.2 g/dL — ABNORMAL LOW (ref 13.0–17.0)
Potassium: 4.7 mmol/L (ref 3.5–5.1)
Sodium: 134 mmol/L — ABNORMAL LOW (ref 135–145)
TCO2: 34 mmol/L — AB (ref 22–32)

## 2017-10-12 LAB — BPAM RBC
Blood Product Expiration Date: 201902202359
Blood Product Expiration Date: 201902202359
Blood Product Expiration Date: 201902212359
Blood Product Expiration Date: 201902212359
ISSUE DATE / TIME: 201901300727
ISSUE DATE / TIME: 201901300727
ISSUE DATE / TIME: 201901300727
ISSUE DATE / TIME: 201901311413
Unit Type and Rh: 5100
Unit Type and Rh: 5100
Unit Type and Rh: 5100
Unit Type and Rh: 5100

## 2017-10-12 LAB — TYPE AND SCREEN
ABO/RH(D): O POS
Antibody Screen: NEGATIVE
Unit division: 0
Unit division: 0
Unit division: 0
Unit division: 0

## 2017-10-12 LAB — POCT ACTIVATED CLOTTING TIME
ACTIVATED CLOTTING TIME: 153 s
ACTIVATED CLOTTING TIME: 158 s
ACTIVATED CLOTTING TIME: 158 s
ACTIVATED CLOTTING TIME: 169 s
Activated Clotting Time: 158 seconds
Activated Clotting Time: 158 seconds
Activated Clotting Time: 158 seconds
Activated Clotting Time: 158 seconds
Activated Clotting Time: 158 seconds
Activated Clotting Time: 175 seconds

## 2017-10-12 LAB — CULTURE, BLOOD (ROUTINE X 2)
Culture: NO GROWTH
Culture: NO GROWTH
Special Requests: ADEQUATE
Special Requests: ADEQUATE

## 2017-10-12 LAB — COMPREHENSIVE METABOLIC PANEL
ALT: 83 U/L — ABNORMAL HIGH (ref 17–63)
AST: 55 U/L — ABNORMAL HIGH (ref 15–41)
Albumin: 2 g/dL — ABNORMAL LOW (ref 3.5–5.0)
Alkaline Phosphatase: 75 U/L (ref 38–126)
Anion gap: 11 (ref 5–15)
BUN: 74 mg/dL — ABNORMAL HIGH (ref 6–20)
CO2: 29 mmol/L (ref 22–32)
Calcium: 7.7 mg/dL — ABNORMAL LOW (ref 8.9–10.3)
Chloride: 92 mmol/L — ABNORMAL LOW (ref 101–111)
Creatinine, Ser: 2.59 mg/dL — ABNORMAL HIGH (ref 0.61–1.24)
GFR calc Af Amer: 28 mL/min — ABNORMAL LOW (ref >60)
GFR calc non Af Amer: 24 mL/min — ABNORMAL LOW (ref >60)
Glucose, Bld: 193 mg/dL — ABNORMAL HIGH (ref 65–99)
Potassium: 3.8 mmol/L (ref 3.5–5.1)
Sodium: 132 mmol/L — ABNORMAL LOW (ref 135–145)
Total Bilirubin: 1.1 mg/dL (ref 0.3–1.2)
Total Protein: 4.9 g/dL — ABNORMAL LOW (ref 6.5–8.1)

## 2017-10-12 LAB — PROCALCITONIN: PROCALCITONIN: 5.76 ng/mL

## 2017-10-12 LAB — CALCIUM, IONIZED
Calcium, Ionized, Serum: 4.6 mg/dL (ref 4.5–5.6)
Calcium, Ionized, Serum: 4.6 mg/dL (ref 4.5–5.6)

## 2017-10-12 LAB — HEMOGLOBIN AND HEMATOCRIT, BLOOD
HCT: 21.4 % — ABNORMAL LOW (ref 39.0–52.0)
Hemoglobin: 7.4 g/dL — ABNORMAL LOW (ref 13.0–17.0)

## 2017-10-12 LAB — PREPARE PLATELET PHERESIS: Unit division: 0

## 2017-10-12 LAB — COOXEMETRY PANEL
Carboxyhemoglobin: 1.4 % (ref 0.5–1.5)
Methemoglobin: 0.7 % (ref 0.0–1.5)
O2 Saturation: 59.6 %
Total hemoglobin: 6.5 g/dL — CL (ref 12.0–16.0)

## 2017-10-12 LAB — GLUCOSE, CAPILLARY
GLUCOSE-CAPILLARY: 180 mg/dL — AB (ref 65–99)
GLUCOSE-CAPILLARY: 150 mg/dL — AB (ref 65–99)
GLUCOSE-CAPILLARY: 159 mg/dL — AB (ref 65–99)
Glucose-Capillary: 209 mg/dL — ABNORMAL HIGH (ref 65–99)
Glucose-Capillary: 174 mg/dL — ABNORMAL HIGH (ref 65–99)
Glucose-Capillary: 176 mg/dL — ABNORMAL HIGH (ref 65–99)

## 2017-10-12 LAB — LACTATE DEHYDROGENASE: LDH: 553 U/L — ABNORMAL HIGH (ref 98–192)

## 2017-10-12 LAB — CULTURE, RESPIRATORY

## 2017-10-12 LAB — BPAM PLATELET PHERESIS
Blood Product Expiration Date: 201902021616
ISSUE DATE / TIME: 201902011656
Unit Type and Rh: 5100

## 2017-10-12 LAB — BLOOD GAS, ARTERIAL
Acid-Base Excess: 7.9 mmol/L — ABNORMAL HIGH (ref 0.0–2.0)
Bicarbonate: 31.7 mmol/L — ABNORMAL HIGH (ref 20.0–28.0)
Drawn by: 414221
FIO2: 40
O2 Saturation: 99.2 %
PEEP: 5 cmH2O
Patient temperature: 99
Pressure control: 10 cmH2O
RATE: 8 resp/min
pCO2 arterial: 43.6 mmHg (ref 32.0–48.0)
pH, Arterial: 7.477 — ABNORMAL HIGH (ref 7.350–7.450)
pO2, Arterial: 158 mmHg — ABNORMAL HIGH (ref 83.0–108.0)

## 2017-10-12 LAB — CULTURE, RESPIRATORY W GRAM STAIN

## 2017-10-12 LAB — HEPARIN LEVEL (UNFRACTIONATED): Heparin Unfractionated: 0.29 IU/mL — ABNORMAL LOW (ref 0.30–0.70)

## 2017-10-12 LAB — PREPARE RBC (CROSSMATCH)

## 2017-10-12 LAB — MAGNESIUM: Magnesium: 2.2 mg/dL (ref 1.7–2.4)

## 2017-10-12 LAB — APTT: aPTT: 105 seconds — ABNORMAL HIGH (ref 24–36)

## 2017-10-12 MED ORDER — CHLORHEXIDINE GLUCONATE CLOTH 2 % EX PADS
6.0000 | MEDICATED_PAD | Freq: Every day | CUTANEOUS | Status: DC
  Administered 2017-10-13 – 2017-10-27 (×15): 6 via TOPICAL

## 2017-10-12 MED ORDER — SODIUM CHLORIDE 0.9 % IV SOLN
Freq: Once | INTRAVENOUS | Status: AC
  Administered 2017-10-12: 14:00:00 via INTRAVENOUS

## 2017-10-12 MED ORDER — SODIUM CHLORIDE 0.9 % IV SOLN
200.0000 mg | Freq: Once | INTRAVENOUS | Status: AC
  Administered 2017-10-12: 200 mg via INTRAVENOUS
  Filled 2017-10-12: qty 200

## 2017-10-12 MED ORDER — SODIUM CHLORIDE 0.9 % IV SOLN
100.0000 mg | INTRAVENOUS | Status: DC
  Administered 2017-10-13 – 2017-10-14 (×2): 100 mg via INTRAVENOUS
  Filled 2017-10-12 (×2): qty 100

## 2017-10-12 MED ORDER — SODIUM CHLORIDE 0.9 % IV SOLN
0.0000 ug/kg/h | INTRAVENOUS | Status: AC
  Administered 2017-10-12 – 2017-10-14 (×11): 1.2 ug/kg/h via INTRAVENOUS
  Administered 2017-10-14: 1 ug/kg/h via INTRAVENOUS
  Administered 2017-10-14: 0.6 ug/kg/h via INTRAVENOUS
  Administered 2017-10-15: 1.2 ug/kg/h via INTRAVENOUS
  Filled 2017-10-12 (×16): qty 4

## 2017-10-12 NOTE — Progress Notes (Signed)
ANTICOAGULATION CONSULT NOTE   Pharmacy Consult for heparin while on Impella 5.0 Indication: Impella 5.0 in setting of LV thrombus and atrial fibrillation  No Known Allergies  Patient Measurements: Height: 5\' 10"  (177.8 cm) Weight: 181 lb 7 oz (82.3 kg) IBW/kg (Calculated) : 73 Heparin Dosing Weight: 84.1 kg  Vital Signs: Temp: 100.4 F (38 C) (02/02 0715) BP: 79/58 (02/02 0700) Pulse Rate: 46 (02/02 0515)  Labs: Recent Labs    10/10/17 0250  10/11/17 0355 10/11/17 0356 10/11/17 1431 10/11/17 1507 10/11/17 1515 10/11/17 2030 10/12/17 0359 10/12/17 0500  HGB 11.5*  --  9.3*  --   --  9.2* 9.7* 8.5* 7.9*  --   HCT 34.1*  --  28.0*  --   --  27.0* 27.6* 25.1* 23.0*  --   PLT 113*  --  88*  --   --   --  92* 136* 120*  --   APTT 37*  --   --   --  58*  --   --   --  105*  --   HEPARINUNFRC <0.10*  --   --  <0.10*  --   --   --   --   --  0.29*  CREATININE 2.84*   < > 2.72*  --   --  2.70*  --   --  2.59*  --    < > = values in this interval not displayed.    Estimated Creatinine Clearance: 29.4 mL/min (A) (by C-G formula based on SCr of 2.59 mg/dL (H)).  Assessment: 66yom with LV apical thrombus previously on heparin drip. ECHO showing again small LV apical thrombus but no LA appendage thrombus. He is also in atrial fibrillation.   Impella 5.0 was placed 1/31. Heparin 50 units/ml in purge solution was started 1/31 PM and then systemic heparin started for ACT goal 160-180 sec.  Purge flow rate 4.72ml/hr heparin = 220 units/hr ACTs had been running in the 150s. Peripheral heparin increased to 1600 units/hr today and now ACTs at goal per discussion with RN. Significant CT drainage overnight, but now stabilized per RN.  *New max heparin rate 1600 units/hr per Dr. Maren Beach. Order adjusted in Epic.   Plan: 1) Continue to monitor ACTs along with nursing to provide any assistance.  Fredonia Highland, PharmD, BCPS PGY-2 Cardiology Pharmacy Resident Pager:  [phone] 10/12/2017

## 2017-10-12 NOTE — Progress Notes (Addendum)
Patient ID: Bryan Harmon, male   DOB: Nov 05, 1950, 67 y.o.   MRN: [id number]     Advanced Heart Failure Rounding Note  PCP:  Primary Cardiologist: Aundra Dubin   Subjective:    Impella 5.0 placed in the OR 1/30.  Chest tube placed on right 1/31.   Remains intubated/sedated.  Currently on norepinephrine 16 (dwon from 24 yesterday am), milrinone 0.375, amiodarone 30 mg/hr. Impella waveforms remain positional.  Tmax 103.3 overnight. Bld CX- Strep pneumoniae--> IV rocephin initially.  CXR with persistent RLL PNA.  CT chest with multifocal PNA 1/29.  Now ion meropenem + vancomycin 1/30.  Most recent cultures remain negative. Rare yeast on BAL thought to be contaminant by CCM. Had bleeding fro CT yesterday. Hgb 7.9 this am    Creatinine 2.7-> 2.6. Good urine output but weight up a pound   Impella 5.0  P8 Flow 4.6 L/min  [place]: CVP 11 PA 35/27 CI 2.1 Co-ox 59%   Echo: Severe LV dilation, EF 15-20%, severely dilated RV with severely decreased RV systolic function, moderate-severe MR, moderate-severe TR, cannot rule out LV apical thrombus.  Repeat echo with Definity showed definite LV apical thrombus.   TEE (1/28): EF 10-15%, diffuse hypokinesis, small LV apical thrombus, mildly dilated RV with moderately decreased systolic function, no LAA thrombus.    Objective:   Weight Range: 82.3 kg (181 lb 7 oz) Body mass index is 26.03 kg/m.   Vital Signs:   Temp:  [99 F (37.2 C)-103.3 F (39.6 C)] 100.8 F (38.2 C) (02/02 0800) Pulse Rate:  [25-114] 101 (02/02 0840) Resp:  [8-23] 14 (02/02 0840) BP: (79-165)/(58-129) 104/70 (02/02 0840) SpO2:  [87 %-100 %] 100 % (02/02 0900) Arterial Line BP: (81-224)/(26-83) 102/65 (02/02 0900) FiO2 (%):  [40 %] 40 % (02/02 0840) Weight:  [82.3 kg (181 lb 7 oz)] 82.3 kg (181 lb 7 oz) (02/02 0450) Last BM Date: (last charted 09/21/2017)  Weight change: Filed Weights   10/10/17 0427 10/11/17 0415 10/12/17 0450  Weight: 86.3 kg (190 lb 4.1 oz) 82.1  kg (180 lb 16 oz) 82.3 kg (181 lb 7 oz)    Intake/Output:   Intake/Output Summary (Last 24 hours) at 10/12/2017 1021 Last data filed at 10/12/2017 1000 Gross per 24 hour  Intake 4465.86 ml  Output 4695 ml  Net -229.14 ml      Physical Exam   General:  Intubated/sedated on vent. Critically ill appearing  + rigors HEENT: +ETT Neck: supple. JVP to Jaw + swan  Cor: PMI nondisplaced. Impella hum. IRR IRR distant CT in place Left axillary impella site with dressing Lungs: + rhonchi  Abdomen: soft, nontender, nondistended. No hepatosplenomegaly. No bruits or masses. Good bowel sounds. Extremities: no cyanosis, clubbing, rash, 2+ edema TED hose Neuro: intubates sedated    Telemetry   Afib 90s-100s, personally reviewed.   EKG    No new tracings.    Labs    CBC Recent Labs    10/11/17 0355  10/11/17 2030 10/12/17 0359  WBC 9.5   < > 13.1* 12.7*  NEUTROABS 7.9*  --   --  11.3*  HGB 9.3*   < > 8.5* 7.9*  HCT 28.0*   < > 25.1* 23.0*  MCV 91.5   < > 91.3 91.6  PLT 88*   < > 136* 120*   < > = values in this interval not displayed.   Basic Metabolic Panel Recent Labs    10/10/17 1741 10/11/17 0355 10/11/17 1507 10/12/17 0359  NA  130* 132* 131* 132*  K 4.5 3.7 4.1 3.8  CL 89* 92* 89* 92*  CO2 29 28  --  29  GLUCOSE 126* 137* 227* 193*  BUN 67* 67* 54* 74*  CREATININE 2.76* 2.72* 2.70* 2.59*  CALCIUM 7.6* 7.7*  --  7.7*  MG 2.1 2.0  --  2.2  PHOS 3.0 3.2  --   --    Liver Function Tests Recent Labs    10/11/17 0355 10/12/17 0359  AST 78* 55*  ALT 113* 83*  ALKPHOS 87 75  BILITOT 1.2 1.1  PROT 5.0* 4.9*  ALBUMIN 2.2* 2.0*   No results for input(s): LIPASE, AMYLASE in the last 72 hours. Cardiac Enzymes No results for input(s): CKTOTAL, CKMB, CKMBINDEX, TROPONINI in the last 72 hours.  BNP: BNP (last 3 results) Recent Labs    12/13/16 0949 02/11/17 1049 09/25/2017 1218  BNP 369.2* 562.7* 2,485.9*    ProBNP (last 3 results) No results for input(s):  PROBNP in the last 8760 hours.   D-Dimer No results for input(s): DDIMER in the last 72 hours. Hemoglobin A1C No results for input(s): HGBA1C in the last 72 hours. Fasting Lipid Panel No results for input(s): CHOL, HDL, LDLCALC, TRIG, CHOLHDL, LDLDIRECT in the last 72 hours. Thyroid Function Tests No results for input(s): TSH, T4TOTAL, T3FREE, THYROIDAB in the last 72 hours.  Invalid input(s): FREET3  Other results:   Imaging    Dg Chest Port 1 View  Result Date: 10/12/2017 CLINICAL DATA:  LVAD. EXAM: PORTABLE CHEST 1 VIEW COMPARISON:  October 11, 2017. FINDINGS: Cardiomegaly remains. An Impella catheter, ETT, feeding tube, and left subclavian line are stable within visualize limits. The PA catheter remains as well with the distal tip slightly more peripheral projected over the right hilum. A right chest tube remains. No pneumothorax. Effusion and underlying atelectasis on the right is stable. Atelectasis on the left is stable. No other changes. IMPRESSION: 1. Support apparatus as above. 2. Small effusion and underlying atelectasis on the right and the atelectasis on the left are stable. Electronically Signed   By: Dorise Bullion III M.D   On: 10/12/2017 07:20   Dg Chest Port 1 View  Result Date: 10/11/2017 CLINICAL DATA:  Check chest tube placement EXAM: PORTABLE CHEST 1 VIEW COMPARISON:  10/11/2017 FINDINGS: Cardiac shadow remains enlarged. An Impella catheter, endotracheal tube, feeding catheter, Swan-Ganz catheter and left subclavian central line are again seen and stable. Right-sided chest tube is again noted and stable. No pneumothorax is seen. Minimal right basilar atelectasis is noted. No new focal abnormality is seen. IMPRESSION: No change from earlier in the same day. Electronically Signed   By: Inez Catalina M.D.   On: 10/11/2017 16:39     Medications:     Scheduled Medications: . chlorhexidine gluconate (MEDLINE KIT)  15 mL Mouth Rinse BID  . Chlorhexidine Gluconate Cloth   6 each Topical Daily  . feeding supplement (PRO-STAT SUGAR FREE 64)  30 mL Per Tube TID  . insulin aspart  2-6 Units Subcutaneous Q4H  . insulin glargine  12 Units Subcutaneous BID  . mouth rinse  15 mL Mouth Rinse 10 times per day  . mupirocin ointment  1 application Nasal BID  . potassium chloride  20 mEq Oral QID  . sodium chloride flush  3 mL Intravenous Q12H    Infusions: . sodium chloride 250 mL (10/12/17 0700)  . amiodarone 30 mg/hr (10/12/17 0700)  . dexmedetomidine (PRECEDEX) IV infusion 1.2 mcg/kg/hr (10/12/17 0836)  .  dextrose 5 % Impella 5.0 Purge solution    . famotidine (PEPCID) IV 20 mg (10/12/17 0955)  . feeding supplement (VITAL 1.5 CAL) 1,000 mL (10/12/17 0956)  . furosemide (LASIX) infusion 6 mg/hr (10/12/17 0700)  . impella catheter heparin 50 unit/mL in dextrose 5%    . heparin 1,600 Units/hr (10/12/17 0700)  . meropenem (MERREM) IV 1 g (10/12/17 0530)  . milrinone 0.375 mcg/kg/min (10/12/17 0955)  . norepinephrine (LEVOPHED) Adult infusion 16 mcg/min (10/12/17 0701)  . vancomycin Stopped (10/11/17 2101)    PRN Medications: sodium chloride, acetaminophen (TYLENOL) oral liquid 160 mg/5 mL, albuterol, fentaNYL (SUBLIMAZE) injection, fentaNYL (SUBLIMAZE) injection, magic mouthwash, midazolam, morphine injection, ondansetron (ZOFRAN) IV, sodium chloride flush, sodium chloride flush    Patient Profile  Mr [name] is a 67 year old with a history of PAF S/P DC-CV 11/2016, s/p bilateral inguinal hernia repair 11/2017, PVCs, HTN, NICM, chronic systolic heart failure.   Sent from Urgent Care with A fib RVR. Acutely SOB on arrival   Assessment/Plan   1. Acute hypoxemic respiratory failure: RLL PNA, Strep pneumoniae in blood cultures. Respiratory cultures with parainfluenzae virus. Also acute/chronic systolic CHF.Now intubated s/p Impella 5.0 placement, multifocal PNA on CXR and CT chest.  - Remains critically ill  -Antibiotics broadened from ceftriaxone to  vancomycin/meropenem on 1/30. Tmax still 103.3. BAL with rare yeast. Will add anti-fungal coverage -CVP 8-11. Continue IV diuresis, being careful not to lower volume too rapidly with Impella.   - Right chest tube placed 1/31.  - CXR stable with dense RLL/RML consolidation (Personally reviewed) 2.Acute on chronic systolic CHF-> cardiogenic shock : Nonischemic cardiomyopathy. 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 Etiology of his CHF is not clear =>no definite inciting event. He has a history of HTN but doubt this was the only trigger. 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 This would ideally be confirmed by cMRI, but he has not wanted an MRI (concerned about side effects). He also has frequent PVCs, 21% total on last holter in 4/18 which is a risk for fall in EF.No family history of CMP. Cannot rule out viral myocarditis. SPEP negative. With medical management, he initially felt a lot better. However, he quit all his meds in early 2018 with recurrence of NYHA III symptoms and onset of atrial fibrillation.He had TEE-DCCV in 3/18.TEE showed that EF remained25%. He quit his meds again in 4/18 and apparently did not restart them when I asked him to in 6/18. No meds probably since 4/18. Echo this admission with EF 15-20%, severe RV dysfunction, LV apical thrombus. RV has looked better on followup echoes after Impella placement.  Attempted DCCV on 1/28 failed likely due to pressors/inotropes.  [place] 1/29 with low cardiac output => Impella 5.0 placed 1/30.   - Hemodynamics remain marginal despite impella 5.0 and pressors. Very poor native LV function on echo today with moderate to severe RV dysfunction -Continue lasix at 6/hr. Can cut back/hold if CVP < 8  - Impella respositioned slightly today under echo guidance. Flow 4.6L  3. 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 It is possible that the PVCs  contribute to his cardiomyopathy. -Onamiodarone. No change. 4. Atrial fibrillation:Persistent, now with RVR. HR up to 140sinitially, now 110s on amiodarone gtt at30. Not sure how long he has been in atrial fibrillation with marked RVR, but this may have triggered his CHF and AKI due to worsening of cardiac output.  HR now better  controlled.  -Heparin gtt ongoing. - Continue amiodarone gtt at 30 mg/hr.   - Failed TEE-guided DCCV on 1/28 in setting of vasoactive meds.  If we can wean down milrinone/norepinephrine, will re-attempt DCCV in future.   5. AKI: Creatinine 1.08 in 6/18, he has not been on any meds. Suspect cardiorenal syndrome with afib/RVR and fall in cardiac output.He was initially hyperkalemic and acidotic.Creatinine down slightly today 2.7-> 2.6 but overall not much improvement with full hemodynamic support   6. TC:YELYH pneumo PNA as well as parainfluenza virus.CT chest 1/29 with multifocal PNA. Fever to 102 overnight, abx changed on 1/30 to vancomycin/meropenem.   - Repeat cultures negative so far.  - Given persistent fevers and rigors will add fungal coverage  7. Elevated LFTs: Suspect shock liver type picture. - AST/ALT > 1000 at admission but have trended down steadily.  8. LV thrombus: Noted on TTE and TEE, small.  - Heparin gtt.    9. Hyponatremia: Hypervolemic hyponatremia.  10. Malnutrition: tube feeds.     I spent over an hour today discussing the situation to his wife. We discussed how sick his heart is and also the fact that he now has MSOF which is not improving as we thought it would with full hemodynamic support. We also discussed the fact that he has worsening PNA and this is also major complicating factor. Finally we dicussed that even if he recovers from acute insult would not be candidate.   I raised the issue of comfort care with her and she has called her son and asked him to come from [place].   Dr. Aundra Dubin spoke to her as well over the phone  this am.   Will plan for ongoing care for now. Readdress as we go.   CRITICAL CARE Performed by: Glori Bickers  Total critical care time: 70 minutes  Critical care time was exclusive of separately billable procedures (including Impella repositioning) and treating other patients.  Critical care was necessary to treat or prevent imminent or life-threatening deterioration.  Critical care was time spent personally by me (independent of midlevel providers or residents) on the following activities: development of treatment plan with patient and/or surrogate as well as nursing, discussions with consultants, evaluation of patient's response to treatment, examination of patient, obtaining history from patient or surrogate, ordering and performing treatments and interventions, ordering and review of laboratory studies, ordering and review of radiographic studies, pulse oximetry and re-evaluation of patient's condition.    Glori Bickers, MD  10:28 AM   Addendum:  At wife's request I spent another 45 minutes talking to family and friends to update them on the critical nature of his situation and reviewed the treatment course to date in detail. As well as conveying his poor prognosis.   Additional CCT 45 mins.   Glori Bickers, MD  11:56 AM

## 2017-10-12 NOTE — Progress Notes (Signed)
PULMONARY / CRITICAL CARE MEDICINE   Name: Bryan Harmon MRN: [id number] DOB: 04/21/1951    ADMISSION DATE:  09/14/2017  REFERRING MD:  Nils Pyle  CHIEF COMPLAINT:  Fatigue  HISTORY OF PRESENT ILLNESS:   67 y/o male with non-ischemic cardiomyopathy and afib admitted with decompensated heart failure.  Required Impella, vent.    SUBJECTIVE:   Impeller device is positional, currently functioning adequately Remains on milrinone, amiodarone, norepinephrine Sedated this morning, RN reports he did follow commands before a dose of Versed given  VITAL SIGNS: BP 104/70   Pulse (!) 101   Temp (!) 100.8 F (38.2 C)   Resp 14   Ht _0  (1.778 m)   Wt 82.3 kg (181 lb 7 oz)   SpO2 100%   BMI 26.03 kg/m   HEMODYNAMICS: PAP: (19-35)/(13-27) 35/27 CVP:  [0 mmHg-13 mmHg] 11 mmHg PCWP:  [29 mmHg] 29 mmHg CO:  [4.1 L/min-4.8 L/min] 4.2 L/min CI:  [2.1 L/min/m2-2.4 L/min/m2] 2.1 L/min/m2  VENTILATOR SETTINGS: Vent Mode: PCV FiO2 (%):  [40 %] 40 % Set Rate:  [8 bmp] 8 bmp PEEP:  [5 cmH20] 5 cmH20 Plateau Pressure:  [11 cmH20-16 cmH20] 16 cmH20  INTAKE / OUTPUT: I/O last 3 completed shifts: In: 8242 [I.V.:3655.9; Blood:630; Other:145; PN/TI:1443.1; IV Piggyback:1350] Out: 5400 [Urine:6505; Chest Tube:2330]  PHYSICAL EXAMINATION: General: Critically ill, no distress, sedated on mechanical ventilation HEENT: ET tube in place, G tube in place, PA catheter Neuro: Sedated, will grimace and move his head with stimulation CV: Tachycardic, regular, mechanical sounds from Impella heard PULM: Coarse bilaterally, decreased at both bases, no wheezes.  Chest tube in place and to suction GI: Soft, nontender, hypoactive bowel sounds Extremities: Trace pretibial edema Skin: No rash  LABS:  BMET Recent Labs  Lab 10/10/17 1741 10/11/17 0355 10/11/17 1507 10/12/17 0359  NA 130* 132* 131* 132*  K 4.5 3.7 4.1 3.8  CL 89* 92* 89* 92*  CO2 29 28  --  29  BUN 67* 67* 54* 74*   CREATININE 2.76* 2.72* 2.70* 2.59*  GLUCOSE 126* 137* 227* 193*    Electrolytes Recent Labs  Lab 10/10/17 0250 10/10/17 1741 10/11/17 0355 10/12/17 0359  CALCIUM 7.8* 7.6* 7.7* 7.7*  MG 2.2 2.1 2.0 2.2  PHOS 3.5 3.0 3.2  --     CBC Recent Labs  Lab 10/11/17 1515 10/11/17 2030 10/12/17 0359  WBC 10.1 13.1* 12.7*  HGB 9.7* 8.5* 7.9*  HCT 27.6* 25.1* 23.0*  PLT 92* 136* 120*    Coag's Recent Labs  Lab 10/05/2017 0005 10/10/17 0250 10/11/17 1431 10/12/17 0359  APTT  --  37* 58* 105*  INR 1.33  --   --   --     Sepsis Markers Recent Labs  Lab 09/24/2017 0430 10/10/17 0926 10/11/17 0355  PROCALCITON 6.00 8.49 6.65    ABG Recent Labs  Lab 10/10/17 1550 10/11/17 0324 10/12/17 0328  PHART 7.492* 7.480* 7.477*  PCO2ART 47.1 45.5 43.6  PO2ART 152.0* 147* 158*    Liver Enzymes Recent Labs  Lab 10/10/17 0250 10/11/17 0355 10/12/17 0359  AST 103* 78* 55*  ALT 176* 113* 83*  ALKPHOS 111 87 75  BILITOT 1.2 1.2 1.1  ALBUMIN 2.0* 2.2* 2.0*    Cardiac Enzymes No results for input(s): TROPONINI, PROBNP in the last 168 hours.  Glucose Recent Labs  Lab 10/11/17 1154 10/11/17 1505 10/11/17 1959 10/12/17 0003 10/12/17 0428 10/12/17 0745  GLUCAP 164* 228* 200* 209* 174* 180*    Imaging Dg  Chest Port 1 View  Result Date: 10/12/2017 CLINICAL DATA:  LVAD. EXAM: PORTABLE CHEST 1 VIEW COMPARISON:  October 11, 2017. FINDINGS: Cardiomegaly remains. An Impella catheter, ETT, feeding tube, and left subclavian line are stable within visualize limits. The PA catheter remains as well with the distal tip slightly more peripheral projected over the right hilum. A right chest tube remains. No pneumothorax. Effusion and underlying atelectasis on the right is stable. Atelectasis on the left is stable. No other changes. IMPRESSION: 1. Support apparatus as above. 2. Small effusion and underlying atelectasis on the right and the atelectasis on the left are stable.  Electronically Signed   By: Dorise Bullion III M.D   On: 10/12/2017 07:20   Dg Chest Port 1 View  Result Date: 10/11/2017 CLINICAL DATA:  Check chest tube placement EXAM: PORTABLE CHEST 1 VIEW COMPARISON:  10/11/2017 FINDINGS: Cardiac shadow remains enlarged. An Impella catheter, endotracheal tube, feeding catheter, Swan-Ganz catheter and left subclavian central line are again seen and stable. Right-sided chest tube is again noted and stable. No pneumothorax is seen. Minimal right basilar atelectasis is noted. No new focal abnormality is seen. IMPRESSION: No change from earlier in the same day. Electronically Signed   By: Inez Catalina M.D.   On: 10/11/2017 16:39    Lines/ Tubes 1/30 L IJ CVL > 1/31 1/30 R IJ swan > 1/30 R sub clav impella > 1/30 L radial arterial line > 1/30 ETT >  1/31 L [place] TLC >   Cultures: 1/21 Blood >> Strep Pneumoniae >> pan sens 1/21 Resp Viral Panel >> + Parainfluenza Virus 3 1/28 blood >  1/30 blood >  1/31 resp culture > 1/31 tracheal aspirate >   ABX:  Rocephin 1/21 >> 1/29 Doxy 1/21 >> 1/23 Vanc 1/30 >>  Mero 1/30 >>   Studies: 10/01/2017>> Echo EF 15-20%, Apex ? PAP muscle vs. Thrombus, LV severely dilated, moderate concentric hypertrophy, systolic function normal, unable to evaluate LV diastolic function due to atrial fibrillation. + moderate spontaneous echo contrast, indicative of stasis Impression 1/23 Echo > Severely dilated LV with severe LV dysfunction globally with EF 15-20%. Severely dialted RV with severe RV dysfunction. Moderate to severe MR with ERO 0.33cm2 and MR volume 43m. Moderate tosevere TR with moderate pulmonary HTN. Moderately thickened and calcified AV leaflets with mild MR, mildly dilated aortic root, massive biatrial enlargement. Cannot rule out LV thrombus. Thereis significant spontaneous echo contrast in LV c/w sluggish blood flow. The right ventricular systolic pressure was increasedconsistent with moderate pulmonary  hypertension. 1/29 CT chest > dense bilateral lower lobe consolidation R>L 1/30 TEE >> LVEF 20-25%, impella in place, poor overall contractility but LV improved   DISCUSSION: 67y/o male with ischemic cardiomyopathy admitted with decompensated systolic heart failure in the setting of strep pneumo pneumonia and bacteremia.  He has acute on chronic kidney failure, cardiogenic shock. New fever on 1/30 of undetermined cause.  BAL 2/1 unrevealing so far  ASSESSMENT / PLAN:  PULMONARY A: Acute respiratory failure with hypoxemia Pneumococcal CAP - RLL  Acute pulmonary edema Bilateral pleural effusions  P:   Tolerating pressure control ventilation, PEEP 5, continue the same Follow serial ABG Given his cardiac status non-position for extubation or spontaneous breathing today Continue diuresis as his blood pressure and renal function can tolerate Antibiotics as below  CARDIOVASCULAR A:  Acute decompensated cardiomyopathy > severe biventricular failure, LVEF ~ 5% S/p Impella 1/30 Cardiogenic Shock  Medical Non-Compliance - stopped taking all meds prior to admit  for CHF, ? Long term plan P:  Post-op care, inotropes per CVTS / CHF service  Amiodarone gtt Diuresis as tolerated  RENAL A:   AKI Hypokalemia/hypervolemic P:   Follow BMP, urine output.  Some interval improvement in his serum creatinine -11 L for the hospitalization Replace electrolytes as indicated Avoid nephrotoxic agents, ensure adequate renal perfusion  GASTROINTESTINAL A:   No acute issues P:   Tube feeding per nutrition Pepcid IV for ulcer prophylaxis  HEMATOLOGIC A:   Mild anemia, thrombocytopenia without bleeding P:  Continue anticoagulation as ordered Follow for any evidence of acute blood loss  INFECTIOUS A:   Strep Pneumonia Bacteremia - 1/21 Severe CAP HCAP? >  No evidence thus far based on BAL from 2/1 Fever 1/30 - new, considering RLL, sinusitis with NGT, multiple lines Parainfluenza virus  pneumonia P:   Continue broad-spectrum antibiotics as above.  Plan to narrow over the next several days depending on culture data Suspect that yeast in his BAL is contaminant, not pathogen Follow chest x-ray, anticipate slow recovery  ENDOCRINE A:   Hyperglycemia - CBG 140-150's P:   Sliding scale insulin per protocol  NEUROLOGIC A:   Sedation Need / Mechanical Ventilation  P:   RASS goal: -1  Sedation protocol > continue precedex, PRN fentanyl, versed   FAMILY  - Updates: Updated his wife 2/2 at bedside   Independent CC time 32 minutes  Baltazar Apo, MD, PhD 10/12/2017, 10:06 AM [facility] [phone] or if no answer [phone]

## 2017-10-12 NOTE — Progress Notes (Signed)
Pharmacy Antibiotic Note  Bryan Harmon is a 67 y.o. male admitted on 09/24/2017 with pneumonia.  Pharmacy has been consulted for Meropenem and vancomycin dosing. Pt previously treated with Rocephin for Strep pneumonia bacteremia but ABX broadened with fevers. Renal function has remained stable with good UOP and pt is s/p bronch with RLL culture collected 2/1. Tmax 100.6 overnight, WBC remains elevated.  Plan: -Vancomycin 1g IV q24h - will defer vancomycin level until BAL cultures result and further plans established -Meropenem 1g IV q12h -Monitor renal funx, cultures, LOT  Height: _0  (177.8 cm) Weight: 181 lb 7 oz (82.3 kg) IBW/kg (Calculated) : 73  Temp (24hrs), Avg:100.9 F (38.3 C), Min:99 F (37.2 C), Max:103.3 F (39.6 C)  Recent Labs  Lab 10/10/17 0250 10/10/17 1741 10/11/17 0355 10/11/17 1507 10/11/17 1515 10/11/17 2030 10/12/17 0359  WBC 11.0*  --  9.5  --  10.1 13.1* 12.7*  CREATININE 2.84* 2.76* 2.72* 2.70*  --   --  2.59*    Estimated Creatinine Clearance: 29.4 mL/min (A) (by C-G formula based on SCr of 2.59 mg/dL (H)).    No Known Allergies  Antimicrobials this admission: 1/21 doxy> 1/23 1/21 rocephin > 1/30 1/30 merrem > 1/30 vanc >  Dose adjustments this admission: n/a  Microbiology results: Strep pna + urine antigen 2/1 BAL: pending 1/31 Trach aspirate: no organisms 1/30 Bcx: NGTD 1/28 Bcx: NGTD 1/21 Bcx: NGTD 1/21 Flu: neg 1/21 Bcx: +strep pneumo 1/21 Resp screen: Parainfluenza Virus 3  Thank you for allowing pharmacy to be a part of this patient's care.  Arrie Senate, PharmD, BCPS PGY-2 Cardiology Pharmacy Resident Pager: [phone] 10/12/2017

## 2017-10-12 NOTE — Progress Notes (Signed)
Pt impella positional. When preforming full turns pt dampens out placement signal on impella 5.0. Unable to do full turns. Doing turns as able q2 without affecting waveform. Modena Jansky RN 2 Heart

## 2017-10-13 ENCOUNTER — Inpatient Hospital Stay (HOSPITAL_COMMUNITY): Payer: Medicare Other

## 2017-10-13 LAB — POCT I-STAT, CHEM 8
BUN: 73 mg/dL — ABNORMAL HIGH (ref 6–20)
Calcium, Ion: 0.99 mmol/L — ABNORMAL LOW (ref 1.15–1.40)
Chloride: 95 mmol/L — ABNORMAL LOW (ref 101–111)
Creatinine, Ser: 2.3 mg/dL — ABNORMAL HIGH (ref 0.61–1.24)
GLUCOSE: 151 mg/dL — AB (ref 65–99)
HCT: 23 % — ABNORMAL LOW (ref 39.0–52.0)
HEMOGLOBIN: 7.8 g/dL — AB (ref 13.0–17.0)
POTASSIUM: 4.2 mmol/L (ref 3.5–5.1)
Sodium: 137 mmol/L (ref 135–145)
TCO2: 33 mmol/L — AB (ref 22–32)

## 2017-10-13 LAB — POCT ACTIVATED CLOTTING TIME
ACTIVATED CLOTTING TIME: 158 s
ACTIVATED CLOTTING TIME: 158 s
ACTIVATED CLOTTING TIME: 164 s
Activated Clotting Time: 158 seconds
Activated Clotting Time: 169 seconds
Activated Clotting Time: 169 seconds

## 2017-10-13 LAB — CALCIUM, IONIZED: CALCIUM, IONIZED, SERUM: 4.4 mg/dL — AB (ref 4.5–5.6)

## 2017-10-13 LAB — CBC
HCT: 22.4 % — ABNORMAL LOW (ref 39.0–52.0)
HEMOGLOBIN: 7.7 g/dL — AB (ref 13.0–17.0)
MCH: 31 pg (ref 26.0–34.0)
MCHC: 34.4 g/dL (ref 30.0–36.0)
MCV: 90.3 fL (ref 78.0–100.0)
Platelets: 98 10*3/uL — ABNORMAL LOW (ref 150–400)
RBC: 2.48 MIL/uL — AB (ref 4.22–5.81)
RDW: 15.8 % — ABNORMAL HIGH (ref 11.5–15.5)
WBC: 11.3 10*3/uL — AB (ref 4.0–10.5)

## 2017-10-13 LAB — POCT I-STAT 3, ART BLOOD GAS (G3+)
ACID-BASE EXCESS: 9 mmol/L — AB (ref 0.0–2.0)
BICARBONATE: 33.7 mmol/L — AB (ref 20.0–28.0)
O2 SAT: 99 %
PH ART: 7.413 (ref 7.350–7.450)
TCO2: 35 mmol/L — ABNORMAL HIGH (ref 22–32)
pCO2 arterial: 53.8 mmHg — ABNORMAL HIGH (ref 32.0–48.0)
pO2, Arterial: 161 mmHg — ABNORMAL HIGH (ref 83.0–108.0)

## 2017-10-13 LAB — COMPREHENSIVE METABOLIC PANEL
ALT: 69 U/L — ABNORMAL HIGH (ref 17–63)
AST: 65 U/L — ABNORMAL HIGH (ref 15–41)
Albumin: 1.8 g/dL — ABNORMAL LOW (ref 3.5–5.0)
Alkaline Phosphatase: 76 U/L (ref 38–126)
Anion gap: 10 (ref 5–15)
BUN: 79 mg/dL — ABNORMAL HIGH (ref 6–20)
CO2: 29 mmol/L (ref 22–32)
Calcium: 7.2 mg/dL — ABNORMAL LOW (ref 8.9–10.3)
Chloride: 96 mmol/L — ABNORMAL LOW (ref 101–111)
Creatinine, Ser: 2.38 mg/dL — ABNORMAL HIGH (ref 0.61–1.24)
GFR calc Af Amer: 31 mL/min — ABNORMAL LOW (ref >60)
GFR calc non Af Amer: 27 mL/min — ABNORMAL LOW (ref >60)
Glucose, Bld: 130 mg/dL — ABNORMAL HIGH (ref 65–99)
Potassium: 3.9 mmol/L (ref 3.5–5.1)
Sodium: 135 mmol/L (ref 135–145)
Total Bilirubin: 1.2 mg/dL (ref 0.3–1.2)
Total Protein: 4.4 g/dL — ABNORMAL LOW (ref 6.5–8.1)

## 2017-10-13 LAB — COOXEMETRY PANEL
Carboxyhemoglobin: 1.6 % — ABNORMAL HIGH (ref 0.5–1.5)
Methemoglobin: 0.9 % (ref 0.0–1.5)
O2 Saturation: 75.1 %
Total hemoglobin: 6.9 g/dL — CL (ref 12.0–16.0)

## 2017-10-13 LAB — PREPARE RBC (CROSSMATCH)

## 2017-10-13 LAB — GLUCOSE, CAPILLARY
GLUCOSE-CAPILLARY: 122 mg/dL — AB (ref 65–99)
GLUCOSE-CAPILLARY: 167 mg/dL — AB (ref 65–99)
Glucose-Capillary: 130 mg/dL — ABNORMAL HIGH (ref 65–99)
Glucose-Capillary: 141 mg/dL — ABNORMAL HIGH (ref 65–99)

## 2017-10-13 LAB — HEMOGLOBIN AND HEMATOCRIT, BLOOD
HCT: 20 % — ABNORMAL LOW (ref 39.0–52.0)
HEMOGLOBIN: 6.8 g/dL — AB (ref 13.0–17.0)

## 2017-10-13 LAB — CULTURE, BAL-QUANTITATIVE

## 2017-10-13 LAB — MAGNESIUM: Magnesium: 2 mg/dL (ref 1.7–2.4)

## 2017-10-13 LAB — LACTATE DEHYDROGENASE: LDH: 548 U/L — ABNORMAL HIGH (ref 98–192)

## 2017-10-13 LAB — APTT: aPTT: 121 seconds — ABNORMAL HIGH (ref 24–36)

## 2017-10-13 LAB — CULTURE, BAL-QUANTITATIVE W GRAM STAIN: Culture: 40000 — AB

## 2017-10-13 LAB — HEPARIN LEVEL (UNFRACTIONATED): Heparin Unfractionated: 0.42 IU/mL (ref 0.30–0.70)

## 2017-10-13 MED ORDER — "THROMBI-PAD 3""X3"" EX PADS"
1.0000 | MEDICATED_PAD | Freq: Once | CUTANEOUS | Status: AC
  Administered 2017-10-13: 1 via TOPICAL
  Filled 2017-10-13: qty 1

## 2017-10-13 MED ORDER — SODIUM CHLORIDE 0.9 % IV SOLN
Freq: Once | INTRAVENOUS | Status: AC
  Administered 2017-10-13: 09:00:00 via INTRAVENOUS

## 2017-10-13 NOTE — Progress Notes (Signed)
Patient ID: Bryan Harmon, male   DOB: February 24, 1951, 67 y.o.   MRN: [id number]     Advanced Heart Failure Rounding Note  PCP:  Primary Cardiologist: Aundra Dubin   Subjective:    Impella 5.0 placed in the OR 1/30.  Chest tube placed on right 1/31.   Remains intubated/sedated.  Currently on norepinephrine 14, milrinone 0.375, amiodarone 30 mg/hr. Impella 5.0 with no alarms.  Tmax 102 overnight. Bld CX- Strep pneumoniae--> IV rocephin initially.  CXR with persistent RLL PNA.  CT chest with multifocal PNA 1/29.  Now ion meropenem + vancomycin 1/30.  Most recent cultures remain negative. Rare yeast on BAL thought to be contaminant by CCM but started on anidulafungin 2/2. Still with blood from chest tube. Hgb 6.8 this am   Large decubitus ulcer per nursing.   Creatinine 2.7-> 2.6->2.38. Good urine output, weight stable.   Impella 5.0  P9 Flow 4.2-4.4 L/min  Swan: CVP 10 PA 38/22 CI 2.4 Co-ox 75%   Echo: Severe LV dilation, EF 15-20%, severely dilated RV with severely decreased RV systolic function, moderate-severe Bryan, moderate-severe TR, cannot rule out LV apical thrombus.  Repeat echo with Definity showed definite LV apical thrombus.   TEE (1/28): EF 10-15%, diffuse hypokinesis, small LV apical thrombus, mildly dilated RV with moderately decreased systolic function, no LAA thrombus.    Objective:   Weight Range: 181 lb 7 oz (82.3 kg) Body mass index is 26.03 kg/m.   Vital Signs:   Temp:  [101.3 F (38.5 C)-102.4 F (39.1 C)] 101.8 F (38.8 C) (02/03 0600) Pulse Rate:  [25-110] 44 (02/03 0600) Resp:  [9-25] 14 (02/03 0600) BP: (56-168)/(28-128) 96/55 (02/03 0600) SpO2:  [97 %-100 %] 100 % (02/03 0600) Arterial Line BP: (91-113)/(61-78) 108/70 (02/03 0510) FiO2 (%):  [40 %] 40 % (02/03 0317) Weight:  [181 lb 7 oz (82.3 kg)] 181 lb 7 oz (82.3 kg) (02/03 0427) Last BM Date: 10/03/17  Weight change: Filed Weights   10/11/17 0415 10/12/17 0450 10/13/17 0427  Weight: 180  lb 16 oz (82.1 kg) 181 lb 7 oz (82.3 kg) 181 lb 7 oz (82.3 kg)    Intake/Output:   Intake/Output Summary (Last 24 hours) at 10/13/2017 0824 Last data filed at 10/13/2017 0800 Gross per 24 hour  Intake 4901.7 ml  Output 3850 ml  Net 1051.7 ml      Physical Exam   General: Intubated, sedated but will follow commands.  Neck: JVP 10-12 cm, no thyromegaly or thyroid nodule.  Lungs: Decreased breath sounds at bases bilaterally.  CV: Lateral PMI.  Heart irregular S1/S2, no S3/S4, 1/6 HSM apex.  2+ edema to knees bilaterally.   Abdomen: Soft, nontender, no hepatosplenomegaly, no distention.  Skin: Intact without lesions or rashes.  Neurologic: Sedated but will follow commands.  Psych: Normal affect. Extremities: No clubbing or cyanosis.  HEENT: Normal.    Telemetry   Afib 90s-100s, personally reviewed.   EKG    No new tracings.    Labs    CBC Recent Labs    10/11/17 0355  10/11/17 2030 10/12/17 0359  10/12/17 1749 10/13/17 0253  WBC 9.5   < > 13.1* 12.7*  --   --   --   NEUTROABS 7.9*  --   --  11.3*  --   --   --   HGB 9.3*   < > 8.5* 7.9*   < > 7.4* 6.8*  HCT 28.0*   < > 25.1* 23.0*   < > 21.4*  20.0*  MCV 91.5   < > 91.3 91.6  --   --   --   PLT 88*   < > 136* 120*  --   --   --    < > = values in this interval not displayed.   Basic Metabolic Panel Recent Labs    10/10/17 1741 10/11/17 0355  10/12/17 0359 10/12/17 1618 10/13/17 0253  NA 130* 132*   < > 132* 134* 135  K 4.5 3.7   < > 3.8 4.7 3.9  CL 89* 92*   < > 92* 91* 96*  CO2 29 28  --  29  --  29  GLUCOSE 126* 137*   < > 193* 161* 130*  BUN 67* 67*   < > 74* 83* 79*  CREATININE 2.76* 2.72*   < > 2.59* 2.50* 2.38*  CALCIUM 7.6* 7.7*  --  7.7*  --  7.2*  MG 2.1 2.0  --  2.2  --  2.0  PHOS 3.0 3.2  --   --   --   --    < > = values in this interval not displayed.   Liver Function Tests Recent Labs    10/12/17 0359 10/13/17 0253  AST 55* 65*  ALT 83* 69*  ALKPHOS 75 76  BILITOT 1.1 1.2  PROT  4.9* 4.4*  ALBUMIN 2.0* 1.8*   No results for input(s): LIPASE, AMYLASE in the last 72 hours. Cardiac Enzymes No results for input(s): CKTOTAL, CKMB, CKMBINDEX, TROPONINI in the last 72 hours.  BNP: BNP (last 3 results) Recent Labs    12/13/16 0949 02/11/17 1049 09/12/2017 1218  BNP 369.2* 562.7* 2,485.9*    ProBNP (last 3 results) No results for input(s): PROBNP in the last 8760 hours.   D-Dimer No results for input(s): DDIMER in the last 72 hours. Hemoglobin A1C No results for input(s): HGBA1C in the last 72 hours. Fasting Lipid Panel No results for input(s): CHOL, HDL, LDLCALC, TRIG, CHOLHDL, LDLDIRECT in the last 72 hours. Thyroid Function Tests No results for input(s): TSH, T4TOTAL, T3FREE, THYROIDAB in the last 72 hours.  Invalid input(s): FREET3  Other results:   Imaging    No results found.   Medications:     Scheduled Medications: . chlorhexidine gluconate (MEDLINE KIT)  15 mL Mouth Rinse BID  . Chlorhexidine Gluconate Cloth  6 each Topical Daily  . feeding supplement (PRO-STAT SUGAR FREE 64)  30 mL Per Tube TID  . insulin aspart  2-6 Units Subcutaneous Q4H  . insulin glargine  12 Units Subcutaneous BID  . mouth rinse  15 mL Mouth Rinse 10 times per day  . mupirocin ointment  1 application Nasal BID  . potassium chloride  20 mEq Oral QID  . sodium chloride flush  3 mL Intravenous Q12H    Infusions: . sodium chloride 250 mL (10/13/17 0700)  . sodium chloride    . amiodarone 30 mg/hr (10/13/17 0700)  . anidulafungin    . dexmedetomidine (PRECEDEX) IV infusion 1.2 mcg/kg/hr (10/13/17 0700)  . dextrose 5 % Impella 5.0 Purge solution 4.6 mL/hr at 10/13/17 0800  . famotidine (PEPCID) IV Stopped (10/12/17 1026)  . feeding supplement (VITAL 1.5 CAL) 1,000 mL (10/13/17 0700)  . furosemide (LASIX) infusion 6 mg/hr (10/13/17 0709)  . impella catheter heparin 50 unit/mL in dextrose 5%    . heparin 1,600 Units/hr (10/13/17 0700)  . meropenem (MERREM) IV  Stopped (10/13/17 0543)  . milrinone 0.375 mcg/kg/min (10/13/17 0700)  .  norepinephrine (LEVOPHED) Adult infusion 13.973 mcg/min (10/13/17 0700)  . vancomycin Stopped (10/12/17 2115)    PRN Medications: sodium chloride, acetaminophen (TYLENOL) oral liquid 160 mg/5 mL, albuterol, fentaNYL (SUBLIMAZE) injection, fentaNYL (SUBLIMAZE) injection, magic mouthwash, midazolam, morphine injection, ondansetron (ZOFRAN) IV, sodium chloride flush, sodium chloride flush    Patient Profile  Bryan Harmon is a 67 year old with a history of PAF S/P DC-CV 11/2016, s/p bilateral inguinal hernia repair 11/2017, PVCs, HTN, NICM, chronic systolic heart failure.   Sent from Urgent Care with A fib RVR. Acutely SOB on arrival   Assessment/Plan   1. Acute hypoxemic respiratory failure: RLL PNA, Strep pneumoniae in blood cultures. Respiratory cultures with parainfluenzae virus. Also acute/chronic systolic CHF.Now intubated s/p Impella 5.0 placement, multifocal PNA on CXR and CT chest.  - Remains critically ill  -Antibiotics broadened from ceftriaxone to vancomycin/meropenem on 1/30. Tmax still 102. BAL with rare yeast, anidulafungin started 2/2.  -CVP about 10. Continue Lasix 6 mg/hr, aim for CVP around 9-10 with Impella.   - Right chest tube placed 1/31, still with bloody drainage.  - CXR stable with dense RLL/RML consolidation (Personally reviewed) 2.Acute on chronic systolic CHF-> cardiogenic shock : Nonischemic cardiomyopathy. 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 Etiology of his CHF is not clear =>no definite inciting event. He has a history of HTN but doubt this was the only trigger. 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 This would ideally be confirmed by cMRI, but he has not wanted an MRI (concerned about side effects). He also has frequent PVCs, 21% total on last holter in 4/18 which is a risk for fall in EF.No  family history of CMP. Cannot rule out viral myocarditis. SPEP negative. With medical management, he initially felt a lot better. However, he quit all his meds in early 2018 with recurrence of NYHA III symptoms and onset of atrial fibrillation.He had TEE-DCCV in 3/18.TEE showed that EF remained25%. He quit his meds again in 4/18 and apparently did not restart them when I asked him to in 6/18. No meds probably since 4/18. Echo this admission with EF 15-20%, severe RV dysfunction, LV apical thrombus. RV is hypokinetic.  Attempted DCCV on 1/28 failed likely due to pressors/inotropes.  [place] 1/29 with low cardiac output => Impella 5.0 placed 1/30.  Hemodynamics stable today with CI 2.4 and co-ox 75% but creatinine still elevated.  - Continue current support of RV with milrinone and norepinephrine. - Continue Impella 5.0 at P9. LDH has been stable and last platelet count was higher.  -Continue lasix at 6 mg/hr, aim for CVP 9-10 range.  - End point difficult to envision => would be poor LVAD candidate with renal failure and RV failure.   3. 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 It is possible that the PVCs contribute to his cardiomyopathy. -Onamiodarone. No change. 4. Atrial fibrillation:Persistent, now with RVR. HR up to 140sinitially, now 110s on amiodarone gtt at30. Not sure how long he has been in atrial fibrillation with marked RVR, but this may have triggered his CHF and AKI due to worsening of cardiac output.  HR now better controlled.  -Heparin gtt ongoing. - Continue amiodarone gtt at 30 mg/hr.   - Failed TEE-guided DCCV on 1/28 in setting of vasoactive meds.  If we can wean down milrinone/norepinephrine, will re-attempt DCCV in future.   5. AKI: Creatinine 1.08 in 6/18, he has not been on any meds. Suspect cardiorenal syndrome  with afib/RVR and fall in cardiac output.He was initially hyperkalemic and acidotic.Creatinine down today 2.7-> 2.6 -> 2.38 but overall not  as much improvement as hoped with full hemodynamic support.  6. TR:RNHAF pneumo PNA as well as parainfluenza virus.CT chest 1/29 with multifocal PNA. Fever to 102 overnight, abx changed on 1/30 to vancomycin/meropenem.  Persistent fevers, anidulafungin added 2/2.  - Repeat cultures negative so far.  7. Elevated LFTs: Suspect shock liver type picture. - AST/ALT > 1000 at admission but have trended down steadily.  8. LV thrombus: Noted on TTE and TEE, small.  - Heparin gtt.    9. Hyponatremia: Hypervolemic hyponatremia. Resolved.  10. Malnutrition: tube feeds.    11. Decubitus ulcer: Wound care.  12. Anemia: Significant blood loss via chest tube.  Transfuse 2 units PRBCs today.  Can increase Lasix gtt if CVP rises.   I talked with Bryan Parlato wife and son today.  His overall picture is very concerning and I am not sure that we have a good end point.  We will plan to continue aggressive support today, but if he is not improving significantly in the next 24-48 hrs, will need to strongly consider moving towards comfort care.  Family will discuss.   CRITICAL CARE Performed by: Loralie Champagne  Total critical care time: 60 minutes  Critical care time was exclusive of separately billable procedures (including Impella repositioning) and treating other patients.  Critical care was necessary to treat or prevent imminent or life-threatening deterioration.  Critical care was time spent personally by me (independent of midlevel providers or residents) on the following activities: development of treatment plan with patient and/or surrogate as well as nursing, discussions with consultants, evaluation of patient's response to treatment, examination of patient, obtaining history from patient or surrogate, ordering and performing treatments and interventions, ordering and review of laboratory studies, ordering and review of radiographic studies, pulse oximetry and re-evaluation of patient's  condition.  Loralie Champagne, MD  8:24 AM 10/13/2017

## 2017-10-13 NOTE — Plan of Care (Signed)
  Clinical Measurements: Respiratory complications will improve 10/13/2017 1824 - Progressing by Jacklynn Ganong, RN  Patient able to wean on 10/5 for 4 hours today Nutrition: Adequate nutrition will be maintained 10/13/2017 1824 - Progressing by Jacklynn Ganong, RN Patient able to tolerate TF at goal rate  Coping: Level of anxiety will decrease 10/13/2017 1824 - Progressing by Jacklynn Ganong, RN  Patient able to cope with anxiety during SBT and requiring less doses of Versed/Fentanyl  Skin Integrity: Risk for impaired skin integrity will decrease 10/13/2017 1824 - Not Progressing by Jacklynn Ganong, RN Patient with thoracic spine Stage I breakdown and Stage II/deep tissue injury on sacrum

## 2017-10-13 NOTE — Progress Notes (Signed)
PULMONARY / CRITICAL CARE MEDICINE   Name: KALIX MEINECKE MRN: [id number] DOB: 08-23-1951    ADMISSION DATE:  10/01/2017  REFERRING MD:  Nils Pyle  CHIEF COMPLAINT:  Fatigue  HISTORY OF PRESENT ILLNESS:   67 y/o male with non-ischemic cardiomyopathy and afib admitted with decompensated heart failure.  Required Impella, vent.    SUBJECTIVE:   No significant clinical change Impala device functioning Blood noted from right chest tube Antifungal coverage added 2/2  VITAL SIGNS: BP (!) 76/58   Pulse (!) 49   Temp (!) 102.2 F (39 C)   Resp 14   Ht _0  (1.778 m)   Wt 82.3 kg (181 lb 7 oz)   SpO2 100%   BMI 26.03 kg/m   HEMODYNAMICS: PAP: (26-42)/(19-31) 39/28 CVP:  [6 mmHg-14 mmHg] 12 mmHg CO:  [4.8 L/min] 4.8 L/min CI:  [2.4 L/min/m2] 2.4 L/min/m2  VENTILATOR SETTINGS: Vent Mode: PCV FiO2 (%):  [40 %] 40 % Set Rate:  [8 bmp] 8 bmp PEEP:  [5 cmH20] 5 cmH20 Plateau Pressure:  [15 cmH20-16 cmH20] 16 cmH20  INTAKE / OUTPUT: I/O last 3 completed shifts: In: 7855.8 [I.V.:3510.2; Blood:975; Other:155.6; NI/OE:7035; IV Piggyback:750] Out: 0093 [Urine:4230; Chest Tube:1830]  PHYSICAL EXAMINATION: General: Critically ill, no distress, sedated on mechanical ventilation HEENT: ET tube in place, G tube in place, PA catheter Neuro: Sedated, will grimace and move his head with stimulation CV: Tachycardic, regular, mechanical sounds from Impella heard PULM: Coarse bilaterally, decreased at both bases, no wheezes.  Chest tube in place and to suction, bloody fluid noted GI: Soft, nontender, hypoactive bowel sounds Extremities: Trace pretibial edema Skin: No rash  LABS:  BMET Recent Labs  Lab 10/11/17 0355  10/12/17 0359 10/12/17 1618 10/13/17 0253  NA 132*   < > 132* 134* 135  K 3.7   < > 3.8 4.7 3.9  CL 92*   < > 92* 91* 96*  CO2 28  --  29  --  29  BUN 67*   < > 74* 83* 79*  CREATININE 2.72*   < > 2.59* 2.50* 2.38*  GLUCOSE 137*   < > 193* 161* 130*   <  > = values in this interval not displayed.    Electrolytes Recent Labs  Lab 10/10/17 0250 10/10/17 1741 10/11/17 0355 10/12/17 0359 10/13/17 0253  CALCIUM 7.8* 7.6* 7.7* 7.7* 7.2*  MG 2.2 2.1 2.0 2.2 2.0  PHOS 3.5 3.0 3.2  --   --     CBC Recent Labs  Lab 10/11/17 1515 10/11/17 2030 10/12/17 0359 10/12/17 1618 10/12/17 1749 10/13/17 0253  WBC 10.1 13.1* 12.7*  --   --   --   HGB 9.7* 8.5* 7.9* 8.2* 7.4* 6.8*  HCT 27.6* 25.1* 23.0* 24.0* 21.4* 20.0*  PLT 92* 136* 120*  --   --   --     Coag's Recent Labs  Lab 09/28/2017 0005  10/11/17 1431 10/12/17 0359 10/13/17 0253  APTT  --    < > 58* 105* 121*  INR 1.33  --   --   --   --    < > = values in this interval not displayed.    Sepsis Markers Recent Labs  Lab 10/10/17 0926 10/11/17 0355 10/12/17 0359  PROCALCITON 8.49 6.65 5.76    ABG Recent Labs  Lab 10/11/17 0324 10/12/17 0328 10/13/17 0352  PHART 7.480* 7.477* 7.413  PCO2ART 45.5 43.6 53.8*  PO2ART 147* 158* 161.0*    Liver Enzymes Recent  Labs  Lab 10/11/17 0355 10/12/17 0359 10/13/17 0253  AST 78* 55* 65*  ALT 113* 83* 69*  ALKPHOS 87 75 76  BILITOT 1.2 1.1 1.2  ALBUMIN 2.2* 2.0* 1.8*    Cardiac Enzymes No results for input(s): TROPONINI, PROBNP in the last 168 hours.  Glucose Recent Labs  Lab 10/12/17 0745 10/12/17 1212 10/12/17 1602 10/12/17 2003 10/12/17 2348 10/13/17 0350  GLUCAP 180* 176* 150* 159* 167* 130*    Imaging Dg Chest Port 1 View  Result Date: 10/13/2017 CLINICAL DATA:  Heart failure. EXAM: PORTABLE CHEST 1 VIEW COMPARISON:  10/12/2017 FINDINGS: Endotracheal tube terminates approximately 2.7 cm above the carina. A feeding tube courses into the left upper abdomen with tip not imaged. Right jugular Swan-Ganz catheter tip projects over the right hilum, minimally more centrally positioned than on the prior study. An Impella device, left subclavian catheter, and right chest tube remain in place. The cardiac  silhouette remains enlarged. There are likely trace bilateral pleural effusions and suspected mild edema. Right greater than left basilar atelectasis is not significantly changed. No pneumothorax is identified. IMPRESSION: 1. Support devices as above. 2. Similar appearance of bibasilar atelectasis, mild edema, and likely trace bilateral pleural effusions. Electronically Signed   By: Logan Bores M.D.   On: 10/13/2017 08:24    Lines/ Tubes 1/30 L IJ CVL > 1/31 1/30 R IJ swan > 1/30 R sub clav impella > 1/30 L radial arterial line > 1/30 ETT >  1/31 L [place] TLC >   Cultures: 1/21 Blood >> Strep Pneumoniae >> pan sens 1/21 Resp Viral Panel >> + Parainfluenza Virus 3 1/28 blood >  1/30 blood >  1/31 resp culture > 1/31 tracheal aspirate >   ABX:  Rocephin 1/21 >> 1/29 Doxy 1/21 >> 1/23 Vanc 1/30 >>  Mero 1/30 >>  Eraxis 2/2 >>    Studies: 10/01/2017>> Echo EF 15-20%, [place] ? PAP muscle vs. Thrombus, LV severely dilated, moderate concentric hypertrophy, systolic function normal, unable to evaluate LV diastolic function due to atrial fibrillation. + moderate spontaneous echo contrast, indicative of stasis Impression 1/23 Echo > Severely dilated LV with severe LV dysfunction globally with EF 15-20%. Severely dialted RV with severe RV dysfunction. Moderate to severe MR with ERO 0.33cm2 and MR volume 43m. Moderate tosevere TR with moderate pulmonary HTN. Moderately thickened and calcified AV leaflets with mild MR, mildly dilated aortic root, massive biatrial enlargement. Cannot rule out LV thrombus. Thereis significant spontaneous echo contrast in LV c/w sluggish blood flow. The right ventricular systolic pressure was increasedconsistent with moderate pulmonary hypertension. 1/29 CT chest > dense bilateral lower lobe consolidation R>L 1/30 TEE >> LVEF 20-25%, impella in place, poor overall contractility but LV improved   DISCUSSION: 67y/o male with ischemic cardiomyopathy admitted  with decompensated systolic heart failure in the setting of strep pneumo pneumonia and bacteremia.  He has acute on chronic kidney failure, cardiogenic shock. New fever on 1/30 of undetermined cause.  BAL 2/1 unrevealing so far  ASSESSMENT / PLAN:  PULMONARY A: Acute respiratory failure with hypoxemia Pneumococcal CAP - RLL  Acute pulmonary edema Bilateral pleural effusions  P:   Tolerating pressure control ventilation, ABG 2/3 reviewed Follow ABG Not in a position for spontaneous breathing trials given hemodynamic instability Continue diuresis as his renal function and blood pressure can tolerate Antibiotics plus antifungal as below  CARDIOVASCULAR A:  Acute decompensated cardiomyopathy > severe biventricular failure, LVEF ~ 5% S/p Impella 1/30 Cardiogenic Shock  Medical Non-Compliance -  stopped taking all meds prior to admit for CHF, ? Long term plan P:  Volume management, inotrope management as per advanced heart failure team plans, TCTS plans.  Amiodarone drip Note discussions with patient's wife by Drs. Bensimhon and today by Dr Aundra Dubin regarding prognosis for meaningful recovery.   RENAL A:   AKI Hypokalemia/hypervolemic P:   Continue to follow urine output, BMP Replace electrolytes as indicated Avoid nephrotoxic agents, ensure adequate renal perfusion  GASTROINTESTINAL A:   No acute issues P:   Tube feeding per nutrition Pepcid IV for ulcer prophylaxis  HEMATOLOGIC A:   Anemia, thrombocytopenia  Bloody chest tube output P:  May need to consider adjusting his anticoagulation if hemoglobin does not stabilize 2 units PRBC ordered this morning Follow CBC this p.m.  INFECTIOUS A:   Strep Pneumonia Bacteremia - 1/21 Severe CAP HCAP? >  No evidence thus far based on BAL from 2/1 Fever 1/30 - new, considering RLL, sinusitis with NGT, multiple lines Parainfluenza virus pneumonia P:   Broad-spectrum antibiotics as above Suspect that yeast in BAL is a  contaminant, empiric anidulafungin added 2/2 Follow chest x-ray If fever continues could consider repeating CT scan of the chest to look for evidence of pulmonary abscess, unlikely  ENDOCRINE A:   Hyperglycemia  P:   Sliding scale insulin per protocol  NEUROLOGIC A:   Sedation Need / Mechanical Ventilation  P:   RASS goal: -1  Sedation protocol > continue precedex, PRN fentanyl, versed   FAMILY  - Updates: Updated his wife 2/2 at bedside   Independent CC time 33 minutes  Baltazar Apo, MD, PhD 10/13/2017, 8:34 AM [facility] [phone] or if no answer [phone]

## 2017-10-13 NOTE — Progress Notes (Signed)
ANTICOAGULATION CONSULT NOTE   Pharmacy Consult for heparin while on Impella 5.0 Indication: Impella 5.0 in setting of LV thrombus and atrial fibrillation  No Known Allergies  Patient Measurements: Height: 5\' 10"  (177.8 cm) Weight: 181 lb 7 oz (82.3 kg) IBW/kg (Calculated) : 73 Heparin Dosing Weight: 84.1 kg  Vital Signs: Temp: 102.2 F (39 C) (02/03 0800) BP: 76/58 (02/03 0800) Pulse Rate: 49 (02/03 0800)  Labs: Recent Labs    10/11/17 0356 10/11/17 1431  10/11/17 1515 10/11/17 2030 10/12/17 0359 10/12/17 0500 10/12/17 1618 10/12/17 1749 10/13/17 0253  HGB  --   --    < > 9.7* 8.5* 7.9*  --  8.2* 7.4* 6.8*  HCT  --   --    < > 27.6* 25.1* 23.0*  --  24.0* 21.4* 20.0*  PLT  --   --   --  92* 136* 120*  --   --   --   --   APTT  --  58*  --   --   --  105*  --   --   --  121*  HEPARINUNFRC <0.10*  --   --   --   --   --  0.29*  --   --  0.42  CREATININE  --   --    < >  --   --  2.59*  --  2.50*  --  2.38*   < > = values in this interval not displayed.    Estimated Creatinine Clearance: 32 mL/min (A) (by C-G formula based on SCr of 2.38 mg/dL (H)).  Assessment: 66yom with LV apical thrombus previously on heparin drip. ECHO showing again small LV apical thrombus but no LA appendage thrombus. He is also in atrial fibrillation.   Impella 5.0 was placed 1/31. Heparin 50 units/ml in purge solution was started 1/31 PM and then systemic heparin started for ACT goal 160-180 sec.   Purge flow rate 4.6 ml/hr heparin = 230 units/hr.  Per RN, CT with ~ 1L output overnight  ACTs had been running in the upper 150s. Peripheral heparin increased to 1700 units/hr per RN discussion with Dr. [name].   Plan: 1) Continue to monitor ACTs along with nursing to provide any assistance.  Tad Moore, BCPS  Clinical Pharmacist Pager [phone]  10/13/2017 8:40 AM

## 2017-10-14 ENCOUNTER — Inpatient Hospital Stay (HOSPITAL_COMMUNITY): Payer: Medicare Other

## 2017-10-14 DIAGNOSIS — Z87891 Personal history of nicotine dependence: Secondary | ICD-10-CM

## 2017-10-14 DIAGNOSIS — J942 Hemothorax: Secondary | ICD-10-CM

## 2017-10-14 DIAGNOSIS — J181 Lobar pneumonia, unspecified organism: Secondary | ICD-10-CM

## 2017-10-14 DIAGNOSIS — R569 Unspecified convulsions: Secondary | ICD-10-CM

## 2017-10-14 DIAGNOSIS — R4189 Other symptoms and signs involving cognitive functions and awareness: Secondary | ICD-10-CM

## 2017-10-14 DIAGNOSIS — Z9911 Dependence on respirator [ventilator] status: Secondary | ICD-10-CM

## 2017-10-14 DIAGNOSIS — R7881 Bacteremia: Secondary | ICD-10-CM

## 2017-10-14 DIAGNOSIS — I509 Heart failure, unspecified: Secondary | ICD-10-CM

## 2017-10-14 DIAGNOSIS — J189 Pneumonia, unspecified organism: Secondary | ICD-10-CM

## 2017-10-14 DIAGNOSIS — J13 Pneumonia due to Streptococcus pneumoniae: Secondary | ICD-10-CM

## 2017-10-14 DIAGNOSIS — Z9689 Presence of other specified functional implants: Secondary | ICD-10-CM

## 2017-10-14 DIAGNOSIS — I482 Chronic atrial fibrillation: Secondary | ICD-10-CM

## 2017-10-14 DIAGNOSIS — T82538A Leakage of other cardiac and vascular devices and implants, initial encounter: Secondary | ICD-10-CM

## 2017-10-14 DIAGNOSIS — R509 Fever, unspecified: Secondary | ICD-10-CM

## 2017-10-14 DIAGNOSIS — Z9889 Other specified postprocedural states: Secondary | ICD-10-CM

## 2017-10-14 DIAGNOSIS — N179 Acute kidney failure, unspecified: Secondary | ICD-10-CM

## 2017-10-14 DIAGNOSIS — J9600 Acute respiratory failure, unspecified whether with hypoxia or hypercapnia: Secondary | ICD-10-CM

## 2017-10-14 DIAGNOSIS — L8990 Pressure ulcer of unspecified site, unspecified stage: Secondary | ICD-10-CM

## 2017-10-14 LAB — POCT I-STAT 3, ART BLOOD GAS (G3+)
Acid-Base Excess: 8 mmol/L — ABNORMAL HIGH (ref 0.0–2.0)
Acid-Base Excess: 5 mmol/L — ABNORMAL HIGH (ref 0.0–2.0)
Bicarbonate: 29.8 mmol/L — ABNORMAL HIGH (ref 20.0–28.0)
Bicarbonate: 28.7 mmol/L — ABNORMAL HIGH (ref 20.0–28.0)
O2 SAT: 100 %
O2 Saturation: 99 %
PCO2 ART: 36.6 mmHg (ref 32.0–48.0)
PH ART: 7.438 (ref 7.350–7.450)
Patient temperature: 39.4
TCO2: 31 mmol/L (ref 22–32)
TCO2: 30 mmol/L (ref 22–32)
pCO2 arterial: 43.6 mmHg (ref 32.0–48.0)
pH, Arterial: 7.527 — ABNORMAL HIGH (ref 7.350–7.450)
pO2, Arterial: 180 mmHg — ABNORMAL HIGH (ref 83.0–108.0)
pO2, Arterial: 134 mmHg — ABNORMAL HIGH (ref 83.0–108.0)

## 2017-10-14 LAB — COMPREHENSIVE METABOLIC PANEL
ALT: 60 U/L (ref 17–63)
AST: 63 U/L — ABNORMAL HIGH (ref 15–41)
Albumin: 1.7 g/dL — ABNORMAL LOW (ref 3.5–5.0)
Alkaline Phosphatase: 69 U/L (ref 38–126)
Anion gap: 11 (ref 5–15)
BUN: 74 mg/dL — ABNORMAL HIGH (ref 6–20)
CO2: 26 mmol/L (ref 22–32)
Calcium: 7.2 mg/dL — ABNORMAL LOW (ref 8.9–10.3)
Chloride: 102 mmol/L (ref 101–111)
Creatinine, Ser: 2.22 mg/dL — ABNORMAL HIGH (ref 0.61–1.24)
GFR calc Af Amer: 34 mL/min — ABNORMAL LOW (ref >60)
GFR calc non Af Amer: 29 mL/min — ABNORMAL LOW (ref >60)
Glucose, Bld: 132 mg/dL — ABNORMAL HIGH (ref 65–99)
Potassium: 3.8 mmol/L (ref 3.5–5.1)
Sodium: 139 mmol/L (ref 135–145)
Total Bilirubin: 1.2 mg/dL (ref 0.3–1.2)
Total Protein: 4.2 g/dL — ABNORMAL LOW (ref 6.5–8.1)

## 2017-10-14 LAB — CBC
HCT: 24 % — ABNORMAL LOW (ref 39.0–52.0)
HCT: 25 % — ABNORMAL LOW (ref 39.0–52.0)
Hemoglobin: 8.1 g/dL — ABNORMAL LOW (ref 13.0–17.0)
Hemoglobin: 8.4 g/dL — ABNORMAL LOW (ref 13.0–17.0)
MCH: 29.7 pg (ref 26.0–34.0)
MCH: 29.8 pg (ref 26.0–34.0)
MCHC: 33.8 g/dL (ref 30.0–36.0)
MCHC: 33.6 g/dL (ref 30.0–36.0)
MCV: 87.9 fL (ref 78.0–100.0)
MCV: 88.7 fL (ref 78.0–100.0)
PLATELETS: 94 10*3/uL — AB (ref 150–400)
Platelets: 81 10*3/uL — ABNORMAL LOW (ref 150–400)
RBC: 2.73 MIL/uL — AB (ref 4.22–5.81)
RBC: 2.82 MIL/uL — ABNORMAL LOW (ref 4.22–5.81)
RDW: 16.1 % — AB (ref 11.5–15.5)
RDW: 16 % — AB (ref 11.5–15.5)
WBC: 13.6 10*3/uL — AB (ref 4.0–10.5)
WBC: 13.4 10*3/uL — ABNORMAL HIGH (ref 4.0–10.5)

## 2017-10-14 LAB — AMMONIA: Ammonia: 31 umol/L (ref 9–35)

## 2017-10-14 LAB — POCT I-STAT, CHEM 8
BUN: 72 mg/dL — ABNORMAL HIGH (ref 6–20)
CALCIUM ION: 1.04 mmol/L — AB (ref 1.15–1.40)
Chloride: 98 mmol/L — ABNORMAL LOW (ref 101–111)
Creatinine, Ser: 2.2 mg/dL — ABNORMAL HIGH (ref 0.61–1.24)
GLUCOSE: 146 mg/dL — AB (ref 65–99)
HCT: 23 % — ABNORMAL LOW (ref 39.0–52.0)
HEMOGLOBIN: 7.8 g/dL — AB (ref 13.0–17.0)
Potassium: 4.8 mmol/L (ref 3.5–5.1)
Sodium: 139 mmol/L (ref 135–145)
TCO2: 29 mmol/L (ref 22–32)

## 2017-10-14 LAB — CBC WITH DIFFERENTIAL/PLATELET
BASOS ABS: 0 10*3/uL (ref 0.0–0.1)
Basophils Relative: 0 %
Eosinophils Absolute: 0 10*3/uL (ref 0.0–0.7)
Eosinophils Relative: 0 %
HEMATOCRIT: 19.7 % — AB (ref 39.0–52.0)
HEMOGLOBIN: 6.7 g/dL — AB (ref 13.0–17.0)
LYMPHS PCT: 16 %
Lymphs Abs: 1.9 10*3/uL (ref 0.7–4.0)
MCH: 31 pg (ref 26.0–34.0)
MCHC: 34 g/dL (ref 30.0–36.0)
MCV: 91.2 fL (ref 78.0–100.0)
MONOS PCT: 5 %
Monocytes Absolute: 0.6 10*3/uL (ref 0.1–1.0)
NEUTROS ABS: 9.1 10*3/uL — AB (ref 1.7–7.7)
Neutrophils Relative %: 79 %
Platelets: 88 10*3/uL — ABNORMAL LOW (ref 150–400)
RBC: 2.16 MIL/uL — AB (ref 4.22–5.81)
RDW: 16 % — ABNORMAL HIGH (ref 11.5–15.5)
WBC: 11.6 10*3/uL — ABNORMAL HIGH (ref 4.0–10.5)

## 2017-10-14 LAB — BLOOD GAS, ARTERIAL
ACID-BASE EXCESS: 7.3 mmol/L — AB (ref 0.0–2.0)
Bicarbonate: 31 mmol/L — ABNORMAL HIGH (ref 20.0–28.0)
FIO2: 40
O2 SAT: 98.9 %
PCO2 ART: 41.9 mmHg (ref 32.0–48.0)
PEEP: 5 cmH2O
PH ART: 7.482 — AB (ref 7.350–7.450)
PRESSURE CONTROL: 10 cmH2O
Patient temperature: 98.6
pO2, Arterial: 116 mmHg — ABNORMAL HIGH (ref 83.0–108.0)

## 2017-10-14 LAB — POCT ACTIVATED CLOTTING TIME
ACTIVATED CLOTTING TIME: 142 s
ACTIVATED CLOTTING TIME: 175 s
Activated Clotting Time: 169 seconds
Activated Clotting Time: 169 seconds
Activated Clotting Time: 164 seconds
Activated Clotting Time: 131 seconds
Activated Clotting Time: 142 seconds

## 2017-10-14 LAB — ECHOCARDIOGRAM LIMITED
Height: 70 in
WEIGHTICAEL: 3033.53 [oz_av]

## 2017-10-14 LAB — MAGNESIUM: Magnesium: 2.1 mg/dL (ref 1.7–2.4)

## 2017-10-14 LAB — GLUCOSE, CAPILLARY
GLUCOSE-CAPILLARY: 126 mg/dL — AB (ref 65–99)
GLUCOSE-CAPILLARY: 131 mg/dL — AB (ref 65–99)
GLUCOSE-CAPILLARY: 156 mg/dL — AB (ref 65–99)
Glucose-Capillary: 145 mg/dL — ABNORMAL HIGH (ref 65–99)
Glucose-Capillary: 132 mg/dL — ABNORMAL HIGH (ref 65–99)
Glucose-Capillary: 155 mg/dL — ABNORMAL HIGH (ref 65–99)
Glucose-Capillary: 138 mg/dL — ABNORMAL HIGH (ref 65–99)

## 2017-10-14 LAB — CULTURE, BLOOD (ROUTINE X 2)
CULTURE: NO GROWTH
Culture: NO GROWTH
SPECIAL REQUESTS: ADEQUATE
Special Requests: ADEQUATE

## 2017-10-14 LAB — LACTATE DEHYDROGENASE: LDH: 637 U/L — ABNORMAL HIGH (ref 98–192)

## 2017-10-14 LAB — PREPARE RBC (CROSSMATCH)

## 2017-10-14 LAB — APTT: aPTT: 115 seconds — ABNORMAL HIGH (ref 24–36)

## 2017-10-14 LAB — HEPARIN LEVEL (UNFRACTIONATED): Heparin Unfractionated: 0.57 IU/mL (ref 0.30–0.70)

## 2017-10-14 MED ORDER — LORAZEPAM 2 MG/ML IJ SOLN
INTRAMUSCULAR | Status: AC
  Filled 2017-10-14: qty 1

## 2017-10-14 MED ORDER — ACETAMINOPHEN 10 MG/ML IV SOLN
1000.0000 mg | Freq: Four times a day (QID) | INTRAVENOUS | Status: AC
  Administered 2017-10-14 – 2017-10-15 (×4): 1000 mg via INTRAVENOUS
  Filled 2017-10-14 (×4): qty 100

## 2017-10-14 MED ORDER — SODIUM CHLORIDE 0.9 % IV SOLN
500.0000 mg | Freq: Two times a day (BID) | INTRAVENOUS | Status: DC
  Administered 2017-10-15 – 2017-10-16 (×3): 500 mg via INTRAVENOUS
  Filled 2017-10-14 (×4): qty 5

## 2017-10-14 MED ORDER — LORAZEPAM 2 MG/ML IJ SOLN
2.0000 mg | Freq: Once | INTRAMUSCULAR | Status: AC
  Administered 2017-10-14: 2 mg via INTRAVENOUS

## 2017-10-14 MED ORDER — SODIUM CHLORIDE 0.9 % IV SOLN: Freq: Once | INTRAVENOUS | Status: DC

## 2017-10-14 MED ORDER — LORAZEPAM 2 MG/ML IJ SOLN: 2.0000 mg | Freq: Once | INTRAMUSCULAR | Status: AC

## 2017-10-14 MED ORDER — SODIUM CHLORIDE 0.9 % IV SOLN
1000.0000 mg | Freq: Once | INTRAVENOUS | Status: AC
  Administered 2017-10-14: 1000 mg via INTRAVENOUS
  Filled 2017-10-14: qty 10

## 2017-10-14 MED ORDER — LORAZEPAM 2 MG/ML IJ SOLN
INTRAMUSCULAR | Status: AC
  Administered 2017-10-14: 17:00:00
  Filled 2017-10-14: qty 1

## 2017-10-14 MED ORDER — BISACODYL 10 MG RE SUPP
10.0000 mg | Freq: Once | RECTAL | Status: AC
  Administered 2017-10-14: 10 mg via RECTAL
  Filled 2017-10-14: qty 1

## 2017-10-14 MED ORDER — GERHARDT'S BUTT CREAM
TOPICAL_CREAM | Freq: Two times a day (BID) | CUTANEOUS | Status: DC
  Administered 2017-10-14: 12:00:00 via TOPICAL
  Administered 2017-10-14: 1 via TOPICAL
  Administered 2017-10-15 – 2017-10-16 (×3): via TOPICAL
  Administered 2017-10-16 – 2017-10-17 (×2): 1 via TOPICAL
  Administered 2017-10-17 – 2017-10-24 (×14): via TOPICAL
  Administered 2017-10-25: 1 via TOPICAL
  Administered 2017-10-25 – 2017-10-27 (×4): via TOPICAL
  Filled 2017-10-14 (×3): qty 1

## 2017-10-14 MED ORDER — POTASSIUM CHLORIDE 20 MEQ/15ML (10%) PO SOLN
20.0000 meq | Freq: Four times a day (QID) | ORAL | Status: AC
  Administered 2017-10-14 (×2): 20 meq via ORAL
  Filled 2017-10-14 (×2): qty 15

## 2017-10-14 MED ORDER — SODIUM CHLORIDE 0.9 % IV SOLN
Freq: Once | INTRAVENOUS | Status: AC
  Administered 2017-10-14: 15:00:00 via INTRAVENOUS

## 2017-10-14 MED ORDER — SENNOSIDES 8.8 MG/5ML PO SYRP
5.0000 mL | ORAL_SOLUTION | Freq: Every day | ORAL | Status: DC
  Administered 2017-10-14 – 2017-10-25 (×9): 5 mL via ORAL
  Filled 2017-10-14 (×9): qty 5

## 2017-10-14 MED ORDER — SODIUM CHLORIDE 0.9 % IV BOLUS (SEPSIS): 250.0000 mL | Freq: Once | INTRAVENOUS | Status: AC

## 2017-10-14 NOTE — Progress Notes (Signed)
eLink Physician-Brief Progress Note Patient Name: RICHARDO SPURBECK DOB: 1951/04/17 MRN: [id number]   Date of Service  10/14/2017  HPI/Events of Note  Patient being maintain at 37 C with cooling pads. No order written.   eICU Interventions  Will order Neuro induced normothermia order set.     Intervention Category Major Interventions: Other:  Sommer,Steven Dennard Nip 10/14/2017, 9:04 PM

## 2017-10-14 NOTE — Consult Note (Signed)
Neurology Consultation Reason for Consult: Seizures Referring Physician: Dr Vassie Loll  CC: Seizures  History is obtained from: Chart review  HPI: Bryan Harmon is a 67 y.o. male with non-ischemic cardiomyopathy, acute on chronic systolic CHF with cardiogenic shock s/p Impella placement, persistent fevers (Tmax last 24 hours 103), multifocal PNA, treated Strep bacteremia, vent dependent hypoxic respiratory failure, right sided pleural effusion, acute kidney injury, atrial fibrillation with RVR, hyponatremia, anemia who had neurologic decompensation this afternoon.  On PM rounds, nursing noted that patients chest tube had drained about 400cc of blood.  His heparin drip was stopped.  Patient then was noted to have left pectoral twitching, with unresponsiveness and rolling of his eyes upwards.  He was given 2 mg Ativan and due to  concern for seizure activity, neurology was consulted.  EEG and Stat head CT are pending   ROS: A 14 point ROS was unable to be obtained due to altered level of consciousness  Past Medical History:  Diagnosis Date  . Hypertension   . Inguinal hernia, bilateral      Family History  Problem Relation Age of Onset  . Hypertension Maternal Grandmother   . Cancer Neg Hx   . Heart disease Neg Hx      Social History:  reports that he quit smoking about 21 years ago. he has never used smokeless tobacco. He reports that he drinks alcohol. He reports that he does not use drugs.   Exam: Current vital signs: BP (!) 123/107   Pulse (!) 55   Temp (!) 103.8 F (39.9 C) (Core (Comment))   Resp 16   Ht 5\' 10"  (1.778 m)   Wt 189 lb 9.5 oz (86 kg)   SpO2 97%   BMI 27.20 kg/m  Vital signs in last 24 hours: Temp:  [102.2 F (39 C)-104.2 F (40.1 C)] 103.8 F (39.9 C) (02/04 1446) Pulse Rate:  [25-131] 55 (02/04 1400) Resp:  [15-32] 16 (02/04 1446) BP: (67-171)/(51-148) 123/107 (02/04 1446) SpO2:  [87 %-100 %] 97 % (02/04 1400) Arterial Line BP: (83-120)/(50-73)  86/65 (02/04 1446) FiO2 (%):  [40 %] 40 % (02/04 1213) Weight:  [189 lb 9.5 oz (86 kg)] 189 lb 9.5 oz (86 kg) (02/04 0500)   Physical Exam  Constitutional: Appears critically ill, intubated, unresponsive to stimuli Eyes: No Dolls eyes, pupils minimally reactive with disconjugate upward deviation HENT: NCAT, intubated, left [place] catheter Cardiovascular: Tachycardic rate, appears regular, heart sounds are distant, Chest tube in place with bloody drainage, Impella device in place. Respiratory: Anterior lung sounds are clear, mechanically assisted with symmetric chest rise and fall GI: Soft.  Non-distended Skin: Upper extremities with non-pitting edema and bruising of right arm  Neuro: Mental Status: Patient is unresponsive on Precedex drip  Cranial Nerves: II: No Dolls eyes, pupils minimally reactive with disconjugate upward deviation III,IV, VI: extra ocular muscles unable to be assessed V: unable to assess VII: No facial droop noted VIII: unable to assess X: not assessed XI: not assessed XII: not assessed Motor: Tone is normal. Bulk is normal.   I have reviewed labs in epic and the results pertinent to this consultation are: Glucose 150's ABG with pH 7.43, pCO2 43 Na 139, K 4.8, BUN 72, Cr 2.2, CO2 29 Hemoglobin 8.1, PLT 94  I have reviewed the images obtained: CT head pending EEG pending  Impression:  67 yo man with multiple complex medical problems admitted with atrial fibrillation with RVR, decompensated heart failure s/p Impella placement, Strep bacteremia s/p  treatment, persistent fevers found to be unresponsive this afternoon with concern for new onset seizures vs intracranial hemorrhage vs decreased cerebral perfusion.  ABG as above is reassuring.   Recommendations: 1) EEG 2) Stat Head CT 3) Keppra 1gm now then 500mg  BID after 4) Ammonia level  Gwynn Burly, PGY3 10/14/2017, 2:48 PM  [facility] Internal Medicine Pager: [phone]

## 2017-10-14 NOTE — Progress Notes (Signed)
Pt persistent fever 103-104 degrees. IV tylenol Q4, ice packs axilla and gron, fan on patient, cold rag on head. RN ordered cooling blanket. RN will continue to monitor.

## 2017-10-14 NOTE — Progress Notes (Signed)
Elink paged regarding needing order for normathermia with artic sun pads. RN instructed that Dr. Dellie Catholic will call me back. Will continue to monitor closely. Modena Jansky RN 2 Heart

## 2017-10-14 NOTE — Progress Notes (Signed)
STAT EEG Complete. Neuro notified. Results pending

## 2017-10-14 NOTE — Procedures (Signed)
History: 67 year old male with acute unresponsive  Sedation: Ativan just prior to the EEG  Technique: This is a 21 channel routine scalp EEG performed at the bedside with bipolar and monopolar montages arranged in accordance to the international 10/20 system of electrode placement. One channel was dedicated to EKG recording.    Background: There is intermixed delta and theta activities.  In addition, there are runs of theta and alpha activities most consistent with sleep structures.  Following noxious stimulation, there does appear to be a posterior dominant rhythm of 7-8 Hz that is seen.  There are bifrontally predominant quasiperiodic discharges with triphasic morphology that wax and wane without clear evolution.  There are also occasional periodic runs of discharges that are seen isolated at P3, or isolated at C3, never in any other lead when present the one lead.  They are never seen at both P3 and C3 simultaneously.  These had a possibly artifactual appearance, but this is not definite by this exam.  Photic stimulation: Physiologic driving is not performed  EEG Abnormalities: 1) left parietal central sharp waves versus artifact 2) generalized irregular delta and theta activity 3) triphasic waves   Clinical Interpretation: This EEG recorded possible evidence of epileptogenic zone in the left parietocentral region, though this is not definite by the study and could represent artifact.   There was no seizure recorded on this study. Please note that a normal EEG does not preclude the possibility of epilepsy.   Ritta Slot, MD [facility] [phone]  If 7pm- 7am, please page neurology on call as listed in [facility]ION.

## 2017-10-14 NOTE — Progress Notes (Signed)
MD wants ACT goal at 160. Pharmacy aware. MD aware of chest tube output 100-130/hr. Recheck of Hgb at 1230

## 2017-10-14 NOTE — Progress Notes (Signed)
[facility] fellow on call notified of Impella alarming that impella position can't be found.  CVP 2-5 on monitor.  Per Dr. Prescott Gum note, if CVP <7 hold lasix drip.  Per Dr. Alford Highland note today, keep CVP 9-10.  Lasix drip paused.  Order received to bolus NS 250 ml.  Will continue to closely monitor.

## 2017-10-14 NOTE — Progress Notes (Signed)
   Called by nursing staff for possible seizure. Decreased LOC. Neuro consulted. Given ativan and fentanyl.   EEG and CT Head ordered per Neuro. EEG underway.   T 104. mutl  Increased output from CT.-->over 400cc with repositioning.   On Impella P9 .   MAps low. Norepi increased to 17 mcg. Maps improved. Remains on milrinone 0.375 mcg and amio 30 mg per hour.   Received 3UPRBCs this am for Hgb 6.7---> 7.8 . Another unit PRBCs ordered.   Remains tenous with evidence of multisystem organ failure.    Amy Clegg NP-C  3:09 PM .

## 2017-10-14 NOTE — Progress Notes (Signed)
Patient was transported to CT & back to 2H20 without any complications.  

## 2017-10-14 NOTE — Progress Notes (Signed)
Dr. Cristina Gong at bedside. Orders received for unit PRBC and to restart Heparin at a rate of 1400 units/hour with a max of 1600 units per hour. Will continue to monitor closely. Modena Jansky RN 2 heart

## 2017-10-14 NOTE — Progress Notes (Signed)
I responded to [facility] ICU page  From RN  And evaluated  Pt   And  regarding  Questions about  Restarting  Hep gtt  And  Need for IV  Fluids  As CVP  Was  At 6  After  Reviewing  Detailed  Advanced provider of HF  Notes,  PRBC  One  Unit  And hep gtt  Was  Given  And advised to RN to stop hep gtt if  Any chest  tube bleeding occurs,  Paged HF  On a call to inform about  Above  Plan  And sms  Sent to pager No current  Bleeding is  noted

## 2017-10-14 NOTE — Consult Note (Addendum)
Date of Admission:  10/10/2017          Reason for Consult: PNA and recurrent fevers    Referring Provider: Dr. Aundra Dubin, Kirk Ruths   Assessment: Bryan Harmon is a 67 y.o. male HFrEF and Atriat fibrillation who presented on 1/21 in A-fib with RVR and low output heart failure. He has subsequently has had a complicated hospitalization with placement of an Impella and persistent fevers since 1/30. However, he would have had adequate treatment with Ceftriaxone prior to placement of Impella making seeding less likely. Fevers began on day of initiation of Vancomycin and Meropenem. It appears that these were started as a result of the fevers. Repeat BC on 1/28 and 1/30 showing no growth to date. While BAL did grow Candida albicans, it is unlikely to cause invasive disease in an immunocompetent patient. Pressure ulcers evaluated and due not appear infected. No clear source of infection at this point. Would recommend discontinuing the Vancomycin and Abdiulafungin. Continue Meropenem for now with plans to discontinue tomorrow. If patient continues to have fevers would recommend repeat CT chest with Abdomen and pelvis to look for other possible sources.   Plan: 1. Discontinue Vancomycin and Abdiulafungin 2. Continue Meropenem  3. If patient continues to have fevers would recommend repeat CT chest/abdomen/pelvis to look for other possible sources.   Active Problems:   Acute on chronic systolic heart failure ([place])   Encounter for central line care   Shock circulatory ([place])   Malnutrition of moderate degree   Cardiogenic shock (HCC)   Presence of left ventricular assist device (LVAD) (HCC)   HCAP (healthcare-associated pneumonia)   Pressure injury of skin  Scheduled Meds: . bisacodyl  10 mg Rectal Once  . chlorhexidine gluconate (MEDLINE KIT)  15 mL Mouth Rinse BID  . Chlorhexidine Gluconate Cloth  6 each Topical Daily  . feeding supplement (PRO-STAT SUGAR FREE 64)  30 mL Per Tube TID  .  insulin aspart  2-6 Units Subcutaneous Q4H  . insulin glargine  12 Units Subcutaneous BID  . mouth rinse  15 mL Mouth Rinse 10 times per day  . mupirocin ointment  1 application Nasal BID  . potassium chloride  20 mEq Oral QID  . sennosides  5 mL Oral Daily  . sodium chloride flush  3 mL Intravenous Q12H   Continuous Infusions: . sodium chloride Stopped (10/14/17 0800)  . sodium chloride    . acetaminophen    . amiodarone 30 mg/hr (10/14/17 0742)  . anidulafungin Stopped (10/13/17 1308)  . dexmedetomidine (PRECEDEX) IV infusion 1.2 mcg/kg/hr (10/14/17 0700)  . dextrose 5 % Impella 5.0 Purge solution 4 mL/hr at 10/13/17 1808  . famotidine (PEPCID) IV Stopped (10/13/17 1000)  . feeding supplement (VITAL 1.5 CAL) 1,000 mL (10/14/17 0700)  . impella catheter heparin 50 unit/mL in dextrose 5%    . heparin 1,700 Units/hr (10/14/17 0700)  . meropenem (MERREM) IV Stopped (10/14/17 0612)  . milrinone 0.375 mcg/kg/min (10/14/17 0700)  . norepinephrine (LEVOPHED) Adult infusion 18 mcg/min (10/14/17 0754)  . vancomycin Stopped (10/13/17 2128)   PRN Meds:.sodium chloride, acetaminophen (TYLENOL) oral liquid 160 mg/5 mL, albuterol, fentaNYL (SUBLIMAZE) injection, fentaNYL (SUBLIMAZE) injection, magic mouthwash, midazolam, morphine injection, ondansetron (ZOFRAN) IV, sodium chloride flush, sodium chloride flush  HPI: Bryan Harmon is a 67 y.o. male HFrEF and Atriat fibrillation who presented on 1/21 with generalized weakness and fatigue subsequently found to be in A-fib with RVR. Initially voiced concerns of diarrhea but no respiratory symptoms.  CXR in the ED showing possible RLL opacity and he was started on Ceftriaxone and Doxycycline. He appeared to be in a low output acute heart failure exacerbation and is being managed with aggressive diuresis and HR control, along with milrinone and norepinephrine. A TEE-DCCV was preformed on 1/28. The patient converted and maintained in NSR briefly before  converting back to A-fib. He was then taken for Impella placement on 1/30 and was left intubated due to cardiogenic shock. He has had multiple echocardiograms (TTE and TEE) that have not shown any signs of endocarditis.   CXR have continued to illustrated infiltrate on the right side and right-sided pleural effusion s/p chest tube placement on 1/31.   Patient has been febrile since 1/30 and broadened to Meropenem and Vancomycin.   Abx: Ceftriaxone 1/21 -> 1/30  Doxycycline 1/21 -> 1/23 Vancomycin 1/30 ->  Meropenem 1/30 ->   Abdiulafungin 2/2 ->   Cultures:  - BC on 1/21 growing Streptococcus Pneumoniae sensitive to ceftriaxone - Viral respiratory panel positive for parainfluenza virus 3  - Respiratory cultures growing on 1/31 candida albicans  - Repeat BC on 1/28 and 1/30 showing no growth   Lines/surgeries:  - PICC on 1/22 - TEE-DCCV on 1/28, no vegetations or other signs of endocarditis seen on TEE ([facility] noted) Luiz Blare cath placed on 1/29  - Implella 5.0 on 1/30 and intubated - Chest tube on 1/31 for right pleural effusion  - Bronch on 2/1, BAL growing candida. No masses noted on report   Review of Systems: Unable to obtain as the patient is drowsy and intubated. Unable to answer questions.   Past Medical History:  Diagnosis Date  . Hypertension   . Inguinal hernia, bilateral    Social History   Tobacco Use  . Smoking status: Former Smoker    Last attempt to quit: 09/10/1996    Years since quitting: 21.1  . Smokeless tobacco: Never Used  Substance Use Topics  . Alcohol use: Yes    Comment: occasional   . Drug use: No   Family History  Problem Relation Age of Onset  . Hypertension Maternal Grandmother   . Cancer Neg Hx   . Heart disease Neg Hx    No Known Allergies  OBJECTIVE: Blood pressure (!) 83/65, pulse (!) 118, temperature (!) 103.1 F (39.5 C), temperature source Core, resp. rate 20, height _0  (1.778 m), weight 189 lb 9.5 oz (86 kg), SpO2  97 %.  Physical Exam  Constitutional:  Intubated, unresponsive to verbal stimuli  HENT:  Intubated. Catheter in the IJ, bandage is clean and dry   Eyes: Right eye exhibits discharge. Left eye exhibits discharge.  Cardiovascular: Exam reveals no friction rub.  No murmur heard. Tachycardic, distant heart sounds  Pulmonary/Chest:  Left side clear to ausculation. Right side has minimal crackles throughout  Abdominal: Soft. Bowel sounds are normal. He exhibits no distension. There is no tenderness.  Musculoskeletal: He exhibits edema (Ptting edema of the sacrum and LEs. Nonpitting edema of the upper extremities bilaterally).  Skin: No rash noted.  Left hand is cold compared to the right. No rashes noted.   Lab Results Lab Results  Component Value Date   WBC 11.6 (H) 10/14/2017   HGB 6.7 (LL) 10/14/2017   HCT 19.7 (L) 10/14/2017   MCV 91.2 10/14/2017   PLT 88 (L) 10/14/2017    Lab Results  Component Value Date   CREATININE 2.22 (H) 10/14/2017   BUN 74 (H) 10/14/2017  NA 139 10/14/2017   K 3.8 10/14/2017   CL 102 10/14/2017   CO2 26 10/14/2017    Lab Results  Component Value Date   ALT 60 10/14/2017   AST 63 (H) 10/14/2017   ALKPHOS 69 10/14/2017   BILITOT 1.2 10/14/2017   Microbiology: Recent Results (from the past 240 hour(s))  Culture, blood (Routine X 2) w Reflex to ID Panel     Status: None   Collection Time: 09/15/2017  4:18 PM  Result Value Ref Range Status   Specimen Description BLOOD RIGHT ANTECUBITAL  Final   Special Requests IN PEDIATRIC BOTTLE Blood Culture adequate volume  Final   Culture   Final    NO GROWTH 5 DAYS Performed at [facility], [place] [address] [place], [place] [address]    Report Status 10/12/2017 FINAL  Final  Culture, blood (Routine X 2) w Reflex to ID Panel     Status: None   Collection Time: 10/06/2017  4:20 PM  Result Value Ref Range Status   Specimen Description BLOOD RIGHT ANTECUBITAL  Final   Special Requests IN PEDIATRIC  BOTTLE Blood Culture adequate volume  Final   Culture   Final    NO GROWTH 5 DAYS Performed at [facility], [place] [address], [place], [place] [address]    Report Status 10/12/2017 FINAL  Final  Surgical pcr screen     Status: Abnormal   Collection Time: 09/20/2017  8:37 PM  Result Value Ref Range Status   MRSA, PCR RESULT CALLED TO, READ BACK BY AND VERIFIED WITH: (A) NEGATIVE Final    Comment:  M [facility] RN 09/13/2017 0504 JDW   Staphylococcus aureus INVALID RESULTS, SPECIMEN SENT FOR CULTURE (A) NEGATIVE Final    Comment: Results Called to:  [facility] 10/01/2017 0504 JDW (NOTE) The Xpert SA Assay (FDA approved for NASAL specimens in patients 81 years of age and older), is one component of a comprehensive surveillance program. It is not intended to diagnose infection nor to guide or monitor treatment.   MRSA culture     Status: None   Collection Time: 09/28/2017  8:37 PM  Result Value Ref Range Status   Specimen Description NASOPHARYNGEAL  Final   Special Requests NONE  Final   Culture NO MRSA DETECTED  Final   Report Status 10/10/2017 FINAL  Final  Culture, blood (routine x 2)     Status: None (Preliminary result)   Collection Time: 10/04/2017  5:52 PM  Result Value Ref Range Status   Specimen Description BLOOD RIGHT ANTECUBITAL  Final   Special Requests IN PEDIATRIC BOTTLE Blood Culture adequate volume  Final   Culture   Final    NO GROWTH 4 DAYS Performed at [facility], [place] [address], [place], [place] [address]    Report Status PENDING  Incomplete  Culture, blood (routine x 2)     Status: None (Preliminary result)   Collection Time: 09/16/2017  6:00 PM  Result Value Ref Range Status   Specimen Description BLOOD RIGHT HAND  Final   Special Requests IN PEDIATRIC BOTTLE Blood Culture adequate volume  Final   Culture   Final    NO GROWTH 4 DAYS Performed at [facility], [place] [address] [place], [place] [address]    Report Status PENDING  Incomplete  Culture, respiratory  (NON-Expectorated)     Status: None   Collection Time: 10/10/17  7:55 AM  Result Value Ref Range Status  Specimen Description TRACHEAL ASPIRATE  Final   Special Requests NONE  Final   Gram Stain   Final    RARE WBC PRESENT, PREDOMINANTLY MONONUCLEAR NO ORGANISMS SEEN Performed at [facility], [place] [address] [place], [place] [address]    Culture RARE CANDIDA ALBICANS  Final   Report Status 10/12/2017 FINAL  Final  Culture, bal-quantitative     Status: Abnormal   Collection Time: 10/11/17 11:11 AM  Result Value Ref Range Status   Specimen Description BRONCHIAL ALVEOLAR LAVAGE  Final   Special Requests NONE  Final   Gram Stain   Final    MODERATE WBC PRESENT,BOTH PMN AND MONONUCLEAR NO SQUAMOUS EPITHELIAL CELLS SEEN RARE BUDDING YEAST SEEN Performed at [facility], [place] [address] [place], [place] [address]    Culture 40,000 COLONIES/mL CANDIDA ALBICANS (A)  Final   Report Status 10/13/2017 FINAL  Final   Ina Homes, [place] [facility] [phone] pager   [phone] cell 10/14/2017, 8:26 AM

## 2017-10-14 NOTE — Progress Notes (Signed)
Artic sun pads placed on patient at this time per MD Mclean for cooling temperature purposes. Pt Tmax 104.7.

## 2017-10-14 NOTE — Progress Notes (Signed)
Cards fellow Cristina Gong called regarding CVP less than 10 (5-6) and questions about restarting systemic heparin. Told that he will speak with Heart Failure team and get back to me. Will continue to monitor closely. Modena Jansky RN 2 Heart

## 2017-10-14 NOTE — Progress Notes (Signed)
CRITICAL VALUE ALERT  Critical Value:  Hgb 6.7  Date & Time Notied:  10/14/2017 2263   Provider Notified: Gladstone Lighter MD  Orders Received/Actions taken: 3 Units PRBC

## 2017-10-14 NOTE — Progress Notes (Signed)
ANTICOAGULATION CONSULT NOTE   Pharmacy Consult for heparin while on Impella 5.0 Indication: Impella 5.0 in setting of LV thrombus and atrial fibrillation  No Known Allergies  Patient Measurements: Height: 5\' 10"  (177.8 cm) Weight: 189 lb 9.5 oz (86 kg) IBW/kg (Calculated) : 73 Heparin Dosing Weight: 84.1 kg  Vital Signs: Temp: 103.6 F (39.8 C) (02/04 1213) Temp Source: Core (02/04 1213) BP: 84/69 (02/04 1200) Pulse Rate: 29 (02/04 1213)  Labs: Recent Labs    10/12/17 0359 10/12/17 0500  10/13/17 0253 10/13/17 1554 10/13/17 1606 10/14/17 0239 10/14/17 0240  HGB 7.9*  --    < > 6.8* 7.7* 7.8*  --  6.7*  HCT 23.0*  --    < > 20.0* 22.4* 23.0*  --  19.7*  PLT 120*  --   --   --  98*  --   --  88*  APTT 105*  --   --  121*  --   --  115*  --   HEPARINUNFRC  --  0.29*  --  0.42  --   --  0.57  --   CREATININE 2.59*  --    < > 2.38*  --  2.30*  --  2.22*   < > = values in this interval not displayed.    Estimated Creatinine Clearance: 34.3 mL/min (A) (by C-G formula based on SCr of 2.22 mg/dL (H)).  Assessment: 66yom with LV apical thrombus previously on heparin drip. ECHO showing again small LV apical thrombus but no LA appendage thrombus. He is also in atrial fibrillation.   Impella 5.0 was placed 1/31. Heparin 50 units/ml in purge solution was started 1/31 PM and then systemic heparin started for ACT goal 160-180 sec.   Purge flow rate 4.9 ml/hr heparin = 245 units/hr.  Per RN, CT with ~ 1L output overnight and another 400 mL so far today.   ACTs had been running between 164-175 since yesterday. Peripheral heparin had been increased to 1700 units/hr per discussion with Dr. [name] yesterday. Hgb has dropped today to 6.7 - currently undergoing 3 PRBCs transfusions.  Per Dr. [name] will target goal ACT~160 instead of 160-180 sec. Discussed with nursing and will peripheral heparin infusion reduced to 1600 units/hr (previous ACTs in upper 150s to low 160s on rate).    Plan: 1) Continue to monitor ACTs along with nursing to provide any assistance.  Girard Cooter, PharmD Clinical Pharmacist  Pager: [phone] Clinical Phone for 10/14/2017 until 3:30pm: [phone] If after 3:30pm, please call main pharmacy at [phone]  10/14/2017 12:49 PM

## 2017-10-14 NOTE — Progress Notes (Addendum)
Patient ID: Bryan Harmon, male   DOB: 1951/05/24, 67 y.o.   MRN: [id number]     Advanced Heart Failure Rounding Note  PCP:  Primary Cardiologist: Aundra Dubin   Subjective:    Impella 5.0 placed in the OR 1/30.  Chest tube placed on right 1/31.   On amio 30 mg/hr, milrinone 0.375 mcg, norepi 52mg. Urine output trending down.   Remains febrile 103. Sacrum/R buttock/mid back suspected deep tissue injury.    LDH 548 => 637  Hgb 6.7 after 2 units PRBCs yesterday.   Creatinine 2.7-> 2.6->2.38->2.2.   Impella 5.0  P9 Flow 4.1  Swan: CVP 6-7 PA 29/21  CI 2.4   Echo: Severe LV dilation, EF 15-20%, severely dilated RV with severely decreased RV systolic function, moderate-severe MR, moderate-severe TR, cannot rule out LV apical thrombus.  Repeat echo with Definity showed definite LV apical thrombus.   TEE (1/28): EF 10-15%, diffuse hypokinesis, small LV apical thrombus, mildly dilated RV with moderately decreased systolic function, no LAA thrombus.    Objective:   Weight Range: 189 lb 9.5 oz (86 kg) Body mass index is 27.2 kg/m.   Vital Signs:   Temp:  [102.2 F (39 C)-103.1 F (39.5 C)] 103.1 F (39.5 C) (02/04 0700) Pulse Rate:  [25-125] 25 (02/04 0645) Resp:  [14-35] 21 (02/04 0700) BP: (67-147)/(50-123) 76/51 (02/04 0700) SpO2:  [87 %-100 %] 97 % (02/04 0645) Arterial Line BP: (83-120)/(50-82) 94/52 (02/04 0700) FiO2 (%):  [40 %] 40 % (02/04 0343) Weight:  [189 lb 9.5 oz (86 kg)] 189 lb 9.5 oz (86 kg) (02/04 0500) Last BM Date: 10/03/17  Weight change: Filed Weights   10/12/17 0450 10/13/17 0427 10/14/17 0500  Weight: 181 lb 7 oz (82.3 kg) 181 lb 7 oz (82.3 kg) 189 lb 9.5 oz (86 kg)    Intake/Output:   Intake/Output Summary (Last 24 hours) at 10/14/2017 0713 Last data filed at 10/14/2017 0700 Gross per 24 hour  Intake 5179.17 ml  Output 4065 ml  Net 1114.17 ml      Physical Exam  CVP 6  General: Intubated.  No resp difficulty HEENT: normal Neck:  supple. Hard to assess JVD. Carotids 2+ bilat; no bruits. No lymphadenopathy or thryomegaly appreciated. Cor: PMI nondisplaced. Regular rate & rhythm. No rubs, gallops or murmurs. R axillary impella.  Lungs: Coarse throughout. R CT with bloody exudate.  Abdomen: soft, nontender, nondistended. No hepatosplenomegaly. No bruits or masses. Extremities: no cyanosis, clubbing, rash, edema. LUE cold edematous. LUE A line. .  Neuro: intubated. sedated  Telemetry   A fib 100s personally reviewed.   EKG    No new tracings.    Labs    CBC Recent Labs    10/12/17 0359  10/13/17 1554 10/13/17 1606 10/14/17 0240  WBC 12.7*  --  11.3*  --  11.6*  NEUTROABS 11.3*  --   --   --  PENDING  HGB 7.9*   < > 7.7* 7.8* 6.7*  HCT 23.0*   < > 22.4* 23.0* 19.7*  MCV 91.6  --  90.3  --  91.2  PLT 120*  --  98*  --  88*   < > = values in this interval not displayed.   Basic Metabolic Panel Recent Labs    10/13/17 0253 10/13/17 1606 10/14/17 0240  NA 135 137 139  K 3.9 4.2 3.8  CL 96* 95* 102  CO2 29  --  26  GLUCOSE 130* 151* 132*  BUN 79* 73*  74*  CREATININE 2.38* 2.30* 2.22*  CALCIUM 7.2*  --  7.2*  MG 2.0  --  2.1   Liver Function Tests Recent Labs    10/13/17 0253 10/14/17 0240  AST 65* 63*  ALT 69* 60  ALKPHOS 76 69  BILITOT 1.2 1.2  PROT 4.4* 4.2*  ALBUMIN 1.8* 1.7*   No results for input(s): LIPASE, AMYLASE in the last 72 hours. Cardiac Enzymes No results for input(s): CKTOTAL, CKMB, CKMBINDEX, TROPONINI in the last 72 hours.  BNP: BNP (last 3 results) Recent Labs    12/13/16 0949 02/11/17 1049 09/14/2017 1218  BNP 369.2* 562.7* 2,485.9*    ProBNP (last 3 results) No results for input(s): PROBNP in the last 8760 hours.   D-Dimer No results for input(s): DDIMER in the last 72 hours. Hemoglobin A1C No results for input(s): HGBA1C in the last 72 hours. Fasting Lipid Panel No results for input(s): CHOL, HDL, LDLCALC, TRIG, CHOLHDL, LDLDIRECT in the last 72  hours. Thyroid Function Tests No results for input(s): TSH, T4TOTAL, T3FREE, THYROIDAB in the last 72 hours.  Invalid input(s): FREET3  Other results:   Imaging    No results found.   Medications:     Scheduled Medications: . chlorhexidine gluconate (MEDLINE KIT)  15 mL Mouth Rinse BID  . Chlorhexidine Gluconate Cloth  6 each Topical Daily  . feeding supplement (PRO-STAT SUGAR FREE 64)  30 mL Per Tube TID  . insulin aspart  2-6 Units Subcutaneous Q4H  . insulin glargine  12 Units Subcutaneous BID  . mouth rinse  15 mL Mouth Rinse 10 times per day  . mupirocin ointment  1 application Nasal BID  . potassium chloride  20 mEq Oral QID  . sodium chloride flush  3 mL Intravenous Q12H    Infusions: . sodium chloride 250 mL (10/14/17 0119)  . sodium chloride    . amiodarone 30 mg/hr (10/14/17 0700)  . anidulafungin Stopped (10/13/17 1308)  . dexmedetomidine (PRECEDEX) IV infusion 1.2 mcg/kg/hr (10/14/17 0700)  . dextrose 5 % Impella 5.0 Purge solution 4 mL/hr at 10/13/17 1808  . famotidine (PEPCID) IV Stopped (10/13/17 1000)  . feeding supplement (VITAL 1.5 CAL) 1,000 mL (10/14/17 0700)  . furosemide (LASIX) infusion Stopped (10/14/17 0100)  . impella catheter heparin 50 unit/mL in dextrose 5%    . heparin 1,700 Units/hr (10/14/17 0700)  . meropenem (MERREM) IV Stopped (10/14/17 0612)  . milrinone 0.375 mcg/kg/min (10/14/17 0700)  . norepinephrine (LEVOPHED) Adult infusion 13.973 mcg/min (10/14/17 0600)  . vancomycin Stopped (10/13/17 2128)    PRN Medications: sodium chloride, acetaminophen (TYLENOL) oral liquid 160 mg/5 mL, albuterol, fentaNYL (SUBLIMAZE) injection, fentaNYL (SUBLIMAZE) injection, magic mouthwash, midazolam, morphine injection, ondansetron (ZOFRAN) IV, sodium chloride flush, sodium chloride flush    Patient Profile  [name] is a 67 year old with a history of PAF S/P DC-CV 11/2016, s/p bilateral inguinal hernia repair 11/2017, PVCs, HTN, NICM, chronic  systolic heart failure.   Sent from Urgent Care with A fib RVR. Acutely SOB on arrival   Assessment/Plan   1. Acute hypoxemic respiratory failure: RLL PNA, Strep pneumoniae in blood cultures. Respiratory cultures with parainfluenzae virus. Also acute/chronic systolic CHF.Now intubated s/p Impella 5.0 placement, multifocal PNA on CXR and CT chest. Tmax 103.  - Remains critically ill  -Antibiotics broadened from ceftriaxone to vancomycin/meropenem on 1/30.  - BAL with Candida albicans, anidulafungin started 2/2.  -CVP 6 with Impella.   - Right chest tube placed 1/31, still with bloody drainage.  -  CXR stable with increased R pleural effusion and left basilar atelectasis.  2.Acute on chronic systolic CHF-> cardiogenic shock : Nonischemic cardiomyopathy. 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 Etiology of his CHF is not clear =>no definite inciting event. He has a history of HTN but doubt this was the only trigger. 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 This would ideally be confirmed by cMRI, but he has not wanted an MRI (concerned about side effects). He also has frequent PVCs, 21% total on last holter in 4/18 which is a risk for fall in EF.No family history of CMP. Cannot rule out viral myocarditis. SPEP negative. With medical management, he initially felt a lot better. However, he quit all his meds in early 2018 with recurrence of NYHA III symptoms and onset of atrial fibrillation.He had TEE-DCCV in 3/18.TEE showed that EF remained25%. He quit his meds again in 4/18 and apparently did not restart them when I asked him to in 6/18. No meds probably since 4/18. Echo this admission with EF 15-20%, severe RV dysfunction, LV apical thrombus. RV is hypokinetic.  Attempted DCCV on 1/28 failed likely due to pressors/inotropes.  [place] 1/29 with low cardiac output => Impella 5.0 placed 1/30.  CO 4.8 CI 2.4  today.  CVP 6. Lasix drip stopped at 0100.  - Leave off Lasix with low CVP.  - Continue current support of RV with milrinone and norepinephrine. - Continue Impella 5.0 at P9. LDH trending up 637. We attempted to decrease to P7 but there was a significant fall in flow with drop, so increased back to P9.  Concerned for hemolysis => will assess Impella today under echo guidance and need higher CVP (blood, stopping Lasix).  - End point difficult to envision => would be poor LVAD candidate with renal failure and RV failure.   3. 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 It is possible that the PVCs contribute to his cardiomyopathy. -Onamiodarone. No change. 4. Atrial fibrillation:Persistent, now with RVR. HR up to 140sinitially, now 110s on amiodarone gtt at30. Not sure how long he has been in atrial fibrillation with marked RVR, but this may have triggered his CHF and AKI due to worsening of cardiac output.  HR now better controlled.  -Heparin gtt ongoing. - Continue amiodarone gtt at 30 mg/hr.   - Failed TEE-guided DCCV on 1/28 in setting of vasoactive meds.  If we can wean down milrinone/norepinephrine, will re-attempt DCCV in future.   5. AKI: Creatinine 1.08 in 6/18, he has not been on any meds. Suspect cardiorenal syndrome with afib/RVR and fall in cardiac output.He was initially hyperkalemic and acidotic.Creatinine down today 2.7-> 2.6 -> 2.38-->2.2  but overall not as much improvement as hoped with full hemodynamic support.  6. GQ:QPYPP pneumo PNA as well as parainfluenza virus.CT chest 1/29 with multifocal PNA. TMax 103. On  1/30 to vancomycin/meropenem.  Persistent fevers, anidulafungin added 2/2.  - Cultures negative.   - Will ask ID to see, ?drug fever. 7. Elevated LFTs: Suspect shock liver type picture. - AST/ALT > 1000 at admission but have trended down steadily.  8. LV thrombus: Noted on TTE and TEE, small.  - Heparin gtt. Hgb down to 6.7 9. Hyponatremia:  Resolved.   10. Malnutrition: tube feeds.    13. Anemia: Significant blood loss via chest tube and mouth.  ?Hemolysis from Impella playing a role => need higher CVP, providing with blood and volume.  Need to check  position.  Received 2UPRBCs 2/3. Todays Hgb 6.7. Receiving 3 uPRBCs.   13. Suspected Deep Tissue Injury -->Sacrum/R buttock/Mid back . Continue to repostion R/L. WOC consult pending.  14. L hand cold: Watch closely, will not remove yet because alternative location would be femoral and cuff BP inaccurate.   Amy Clegg NP-C  7:49 AM   Patient seen with NP, agree with the above note.  He is remains critically ill and very tenuous.  Still with fever to 103.  CI 2.4 with full Impella support, significant fall in flow with attempted weaning.  CVP down to 7 today, ideal around 10 with Impella.  LDH up to 637 and hgb 6.7.  Creatinine 2.22.  Still with right chest tube with bloody drainage.    On exam, intubated but able to follow some commands.  Has RIJ Swan and left subclavian central line.  JVP not elevated.  1+ edema to knees.  Left hand cooler than right.  Decreased breath sounds dependently.  Sacral decubitus.   See plan outlined in above note.  Attempted to wean down Impella today => significantly decreased flow at P7 with fall in BP, will keep at P9.  Concern with hemolysis with fall in hgb and plts and rise in LDH.  Will need to keep volume up more, CVP was 7.  Will give blood and stop Lasix today, keep CVP around 10. Will assess Impella placement today with echo.   Ongoing fevers to 103 despite coverage with vanocmycin, meropenem, and anidulafungin.  Will ask ID to see today.  ?Drug fever, ?anything we are missing.  Source assumed to be RLL PNA.   Hemodynamically stable on current support but unable to wean inotropes/Impella.   To get 3 units PRBCs today.   CRITICAL CARE Performed by: Loralie Champagne  Total critical care time: 45 minutes  Critical care time was exclusive of  separately billable procedures and treating other patients.  Critical care was necessary to treat or prevent imminent or life-threatening deterioration.  Critical care was time spent personally by me on the following activities: development of treatment plan with patient and/or surrogate as well as nursing, discussions with consultants, evaluation of patient's response to treatment, examination of patient, obtaining history from patient or surrogate, ordering and performing treatments and interventions, ordering and review of laboratory studies, ordering and review of radiographic studies, pulse oximetry and re-evaluation of patient's condition.  Loralie Champagne 10/14/2017 8:09 AM   Impella position evaluated under echo, 3.2 cm.  Position appears stable, no changes.   Loralie Champagne 10/14/2017 1:19 PM

## 2017-10-14 NOTE — Consult Note (Addendum)
WOC Nurse wound consult note Reason for Consult: new areas on the sacrum and back Wound type: Deep tissue injury, thoracic spine, coccyx and sacrum  Penile wounds have improved and healed Inner thighs with superficial skin loss from moisture using antimicrobial wicking fabric to treat currently  Pressure Injury POA:No Measurement: Sacrum: 8cm x 7cm x 0.1cm  Coccyx: 2cmx 2cm x 0cm  thoracic spine linear area: 9cm x 1cmx 0cm  Wound bed: Sacrum: dark purple, non blanchable with superficial skin loss Coccyx; intact dark purple skin Thoracic spine: intact dark purple skin  Drainage (amount, consistency, odor) none Periwound:edema and ecchymosis  Dressing procedure/placement/frequency: Foam to protect spinal wounds Add Gerhardts for sacrum Progressa bed in place for low air loss and moisture management  Suggested microshifts as much as possible for spinal wounds, continue to turn from side to side, however unable to do as much of a turn due to use of Impella    WOC Nurse team will follow along with you for weekly wound assessments.  Please notify me of any acute changes in the wounds or any new areas of concerns Deniece Rankin [facility] MSN, RN,CWOCN, CNS, CWON-AP [phone]

## 2017-10-14 NOTE — Progress Notes (Signed)
PULMONARY / CRITICAL CARE MEDICINE   Name: Bryan Harmon MRN: [id number] DOB: Jul 12, 1951    ADMISSION DATE:  09/24/2017  REFERRING MD:  Nils Pyle  CHIEF COMPLAINT:  Fatigue  HISTORY OF PRESENT ILLNESS:   67 y/o male with non-ischemic cardiomyopathy and afib admitted with decompensated heart failure.  Required Impella, vent.     Lines/ Tubes 1/30 L IJ CVL > 1/31 1/30 R IJ swan > 1/30 R sub clav impella > 1/30 L radial arterial line > 1/30 ETT >  1/31 L [place] TLC >   Cultures: 1/21 Blood >> Strep Pneumoniae >> pan sens 1/21 Resp Viral Panel >> + Parainfluenza Virus 3 1/28 blood > ng 1/30 blood > ng 1/31 resp culture > ng 2/1 BAL > candida  ABX:  Rocephin 1/21 >> 1/29 Doxy 1/21 >> 1/23 Vanc 1/30 >>  Mero 1/30 >>  Eraxis 2/2 >>    Studies: 10/01/2017>> Echo EF 15-20%, Apex ? PAP muscle vs. Thrombus, LV severely dilated, moderate concentric hypertrophy, systolic function normal, unable to evaluate LV diastolic function due to atrial fibrillation. + moderate spontaneous echo contrast, indicative of stasis Impression 1/23 Echo > Severely dilated LV with severe LV dysfunction globally with EF 15-20%. Severely dialted RV with severe RV dysfunction. Moderate to severe MR with ERO 0.33cm2 and MR volume 20m. Moderate tosevere TR with moderate pulmonary HTN. Moderately thickened and calcified AV leaflets with mild MR, mildly dilated aortic root, massive biatrial enlargement. Cannot rule out LV thrombus. Thereis significant spontaneous echo contrast in LV c/w sluggish blood flow. The right ventricular systolic pressure was increasedconsistent with moderate pulmonary hypertension. 1/29 CT chest > dense bilateral lower lobe consolidation R>L 1/30 TEE >> LVEF 20-25%, impella in place, poor overall contractility but LV improved    SUBJECTIVE:   Febrile 103 max Remains on levo + milrinone gtt Good UO  VITAL SIGNS: BP 94/65   Pulse (!) 25   Temp (!) 103.3 F (39.6 C)    Resp 20   Ht _0  (1.778 m)   Wt 189 lb 9.5 oz (86 kg)   SpO2 97%   BMI 27.20 kg/m   HEMODYNAMICS: PAP: (20-45)/(13-28) 27/19 CVP:  [1 mmHg-17 mmHg] 7 mmHg PCWP:  [16 mmHg] 16 mmHg CO:  [4.4 L/min-5.1 L/min] 4.4 L/min CI:  [2.2 L/min/m2-2.6 L/min/m2] 2.2 L/min/m2  VENTILATOR SETTINGS: Vent Mode: PCV FiO2 (%):  [40 %] 40 % Set Rate:  [8 bmp] 8 bmp PEEP:  [5 cmH20-10 cmH20] 5 cmH20 Pressure Support:  [10 cmH20] 10 cmH20 Plateau Pressure:  [12 cmH20-16 cmH20] 12 cmH20  INTAKE / OUTPUT: I/O last 3 completed shifts: In: 8676.2 [I.V.:4871.1; Blood:630; Other:240.1; NPI/RJ:1884 IV Piggyback:450] Out: 61660[Urine:4715; Chest Tube:1650]  PHYSICAL EXAMINATION: General: Critically ill, no distress, sedated on precedex HEENT - pale, aniecteric Neuro -weak, follows commands , can grip CV: Tachycardic, regular, mechanical sounds from Impella heard PULM: Coarse bilaterally, decreased at both bases, no wheezes.  Chest tube in place and to suction, bloody fluid noted GI: Soft, nontender, hypoactive bowel sounds Extremities: Trace pretibial edema Skin: No rash  LABS:  BMET Recent Labs  Lab 10/12/17 0359  10/13/17 0253 10/13/17 1606 10/14/17 0240  NA 132*   < > 135 137 139  K 3.8   < > 3.9 4.2 3.8  CL 92*   < > 96* 95* 102  CO2 29  --  29  --  26  BUN 74*   < > 79* 73* 74*  CREATININE 2.59*   < >  2.38* 2.30* 2.22*  GLUCOSE 193*   < > 130* 151* 132*   < > = values in this interval not displayed.    Electrolytes Recent Labs  Lab 10/10/17 0250 10/10/17 1741 10/11/17 0355 10/12/17 0359 10/13/17 0253 10/14/17 0240  CALCIUM 7.8* 7.6* 7.7* 7.7* 7.2* 7.2*  MG 2.2 2.1 2.0 2.2 2.0 2.1  PHOS 3.5 3.0 3.2  --   --   --     CBC Recent Labs  Lab 10/12/17 0359  10/13/17 1554 10/13/17 1606 10/14/17 0240  WBC 12.7*  --  11.3*  --  11.6*  HGB 7.9*   < > 7.7* 7.8* 6.7*  HCT 23.0*   < > 22.4* 23.0* 19.7*  PLT 120*  --  98*  --  88*   < > = values in this interval not  displayed.    Coag's Recent Labs  Lab 09/24/2017 0005  10/12/17 0359 10/13/17 0253 10/14/17 0239  APTT  --    < > 105* 121* 115*  INR 1.33  --   --   --   --    < > = values in this interval not displayed.    Sepsis Markers Recent Labs  Lab 10/10/17 0926 10/11/17 0355 10/12/17 0359  PROCALCITON 8.49 6.65 5.76    ABG Recent Labs  Lab 10/13/17 0352 10/14/17 0420 10/14/17 0430  PHART 7.413 7.527* 7.482*  PCO2ART 53.8* 36.6 41.9  PO2ART 161.0* 180.0* 116*    Liver Enzymes Recent Labs  Lab 10/12/17 0359 10/13/17 0253 10/14/17 0240  AST 55* 65* 63*  ALT 83* 69* 60  ALKPHOS 75 76 69  BILITOT 1.1 1.2 1.2  ALBUMIN 2.0* 1.8* 1.7*    Cardiac Enzymes No results for input(s): TROPONINI, PROBNP in the last 168 hours.  Glucose Recent Labs  Lab 10/13/17 0834 10/13/17 1209 10/13/17 1959 10/13/17 2358 10/14/17 0412 10/14/17 0749  GLUCAP 141* 122* 145* 156* 126* 132*    Imaging Dg Chest Port 1 View  Result Date: 10/14/2017 CLINICAL DATA:  Heart failure EXAM: PORTABLE CHEST 1 VIEW COMPARISON:  2/3/9 FINDINGS: Endotracheal tube, feeding catheter and Swan-Ganz catheter are again seen. Left subclavian central line is again noted as well. The endotracheal tube lies at the level of the carina. This may be positional in nature. Consideration for withdrawal is recommended. Impella catheter is seen as well as a right thoracostomy catheter. These are stable in appearance from the prior exam. Increasing opacity in the right hemithorax is noted likely related to a posterior fusion. No focal consolidation is seen. Mild left basilar atelectasis is noted. IMPRESSION: Slight increase in right-sided pleural effusion. Tubes and lines as described above. The endotracheal tube lies at the level of the carina and could be withdrawn slightly. Stable left basilar atelectasis. Electronically Signed   By: Inez Catalina M.D.   On: 10/14/2017 07:19      DISCUSSION: 67 y/o male with ischemic  cardiomyopathy admitted with decompensated systolic heart failure in the setting of strep pneumo pneumonia and bacteremia s/p impella.  He has acute on chronic kidney failure, cardiogenic shock. New fever since 1/30 of undetermined cause, all cultures neg so far    ASSESSMENT / PLAN:  PULMONARY A: Acute respiratory failure with hypoxemia Pneumococcal CAP - RLL  Acute pulmonary edema Bilateral pleural effusions , R >> L P:   On pressure control ventilation,  Surprisingly tolerating some PS/CPAP  CARDIOVASCULAR A:  Acute decompensated cardiomyopathy > severe biventricular failure, LVEF ~ 5% S/p Impella 1/30 Cardiogenic  Shock  Medical Non-Compliance - stopped taking all meds prior to admit for CHF, ? Long term plan P:  Volume management, inotrope management as per advanced heart failure team plans, TCTS plans.  Ct levophed/ milrinone gtt Amiodarone drip   RENAL A:   AKI Hypokalemia/hypervolemic P:   Continue to follow urine output, BMP Replace electrolytes as indicated Avoid nephrotoxic agents, ensure adequate renal perfusion  GASTROINTESTINAL A:   No acute issues P:   Tube feeding per nutrition Pepcid IV for ulcer prophylaxis  HEMATOLOGIC A:   Anemia, thrombocytopenia - blood loss via chest tube Concern for hemolytic anemia with impella, note high LDH P:  PRBCs per CHF service, goal Hb 8 & above Follow CBC this p.m.  INFECTIOUS A:   Strep Pneumonia Bacteremia - 1/21 - completed 10 ds of rocephin Severe CAP HCAP? >  No evidence thus far based on BAL from 2/1 Fever 1/30 - new, considering RLL, sinusitis with NGT, multiple lines Parainfluenza virus pneumonia P:   Dc vanc, ct meropenem Suspect that yeast in BAL is a contaminant, empiric anidulafungin added 2/2 Consider drug fever - ID consulted ,note pct was high  ENDOCRINE A:   Hyperglycemia  P:   Sliding scale insulin per protocol  NEUROLOGIC A:   Sedation Need / Mechanical Ventilation  P:   RASS  goal: -1  Sedation protocol > continue precedex, PRN fentanyl, versed   FAMILY  - Updates: Updated his wife & son  at bedside 2/4. They would like to avoid trach but willing if needed.They remain optimistic for fullrecovery  The patient is critically ill with multiple organ systems failure and requires high complexity decision making for assessment and support, frequent evaluation and titration of therapies, application of advanced monitoring technologies and extensive interpretation of multiple databases. Critical Care Time devoted to patient care services described in this note independent of APP/resident  time is 32 minutes.   Kara Mead MD. Shade Flood. [facility] Pager [phone] If no response call [phone]    10/14/2017, 9:48 AM

## 2017-10-14 NOTE — Progress Notes (Signed)
  Echocardiogram 2D Echocardiogram has been performed.  Janalyn Harder 10/14/2017, 12:47 PM

## 2017-10-14 NOTE — Progress Notes (Signed)
On afternoon rounds, RN noted that chest tube had drained 400 cc of blood. Just transfused 3 units PRBC. IV heparin stopped and stat CBC sent  Patient then went unresponsive with up rolling of eyeballs and jerking movement of left upper arm Stat head CT and EEG ordered, neurology consulted, 2 mg Ativan given  Discussed with Dr. [name]. Additional critical care time x 41m  Rakesh V. Vassie Loll MD

## 2017-10-15 ENCOUNTER — Inpatient Hospital Stay (HOSPITAL_COMMUNITY): Payer: Medicare Other

## 2017-10-15 DIAGNOSIS — Z9889 Other specified postprocedural states: Secondary | ICD-10-CM

## 2017-10-15 DIAGNOSIS — R000 Tachycardia, unspecified: Secondary | ICD-10-CM

## 2017-10-15 DIAGNOSIS — Z930 Tracheostomy status: Secondary | ICD-10-CM

## 2017-10-15 DIAGNOSIS — I330 Acute and subacute infective endocarditis: Secondary | ICD-10-CM

## 2017-10-15 DIAGNOSIS — R569 Unspecified convulsions: Secondary | ICD-10-CM

## 2017-10-15 DIAGNOSIS — I340 Nonrheumatic mitral (valve) insufficiency: Secondary | ICD-10-CM

## 2017-10-15 DIAGNOSIS — Z95811 Presence of heart assist device: Secondary | ICD-10-CM

## 2017-10-15 LAB — URINALYSIS, ROUTINE W REFLEX MICROSCOPIC
Bilirubin Urine: NEGATIVE
Glucose, UA: NEGATIVE mg/dL
Ketones, ur: NEGATIVE mg/dL
Leukocytes, UA: NEGATIVE
Nitrite: NEGATIVE
PH: 5 (ref 5.0–8.0)
Protein, ur: NEGATIVE mg/dL
SPECIFIC GRAVITY, URINE: 1.018 (ref 1.005–1.030)

## 2017-10-15 LAB — BASIC METABOLIC PANEL
ANION GAP: 12 (ref 5–15)
ANION GAP: 11 (ref 5–15)
BUN: 90 mg/dL — ABNORMAL HIGH (ref 6–20)
BUN: 92 mg/dL — ABNORMAL HIGH (ref 6–20)
CALCIUM: 7.3 mg/dL — AB (ref 8.9–10.3)
CALCIUM: 7.2 mg/dL — AB (ref 8.9–10.3)
CHLORIDE: 102 mmol/L (ref 101–111)
CO2: 23 mmol/L (ref 22–32)
CO2: 24 mmol/L (ref 22–32)
Chloride: 101 mmol/L (ref 101–111)
Creatinine, Ser: 2.53 mg/dL — ABNORMAL HIGH (ref 0.61–1.24)
Creatinine, Ser: 2.47 mg/dL — ABNORMAL HIGH (ref 0.61–1.24)
GFR calc non Af Amer: 26 mL/min — ABNORMAL LOW (ref >60)
GFR, EST AFRICAN AMERICAN: 29 mL/min — AB (ref >60)
GFR, EST AFRICAN AMERICAN: 30 mL/min — AB (ref >60)
GFR, EST NON AFRICAN AMERICAN: 25 mL/min — AB (ref >60)
Glucose, Bld: 179 mg/dL — ABNORMAL HIGH (ref 65–99)
Glucose, Bld: 132 mg/dL — ABNORMAL HIGH (ref 65–99)
POTASSIUM: 4.1 mmol/L (ref 3.5–5.1)
Potassium: 4.3 mmol/L (ref 3.5–5.1)
SODIUM: 136 mmol/L (ref 135–145)
Sodium: 137 mmol/L (ref 135–145)

## 2017-10-15 LAB — CBC WITH DIFFERENTIAL/PLATELET
BASOS ABS: 0 10*3/uL (ref 0.0–0.1)
BASOS PCT: 0 %
EOS ABS: 0 10*3/uL (ref 0.0–0.7)
Eosinophils Relative: 0 %
HCT: 23.6 % — ABNORMAL LOW (ref 39.0–52.0)
Hemoglobin: 7.8 g/dL — ABNORMAL LOW (ref 13.0–17.0)
LYMPHS PCT: 12 %
Lymphs Abs: 1.9 10*3/uL (ref 0.7–4.0)
MCH: 29.1 pg (ref 26.0–34.0)
MCHC: 33.1 g/dL (ref 30.0–36.0)
MCV: 88.1 fL (ref 78.0–100.0)
Monocytes Absolute: 0.8 10*3/uL (ref 0.1–1.0)
Monocytes Relative: 5 %
NEUTROS PCT: 83 %
Neutro Abs: 13.2 10*3/uL — ABNORMAL HIGH (ref 1.7–7.7)
PLATELETS: 75 10*3/uL — AB (ref 150–400)
RBC: 2.68 MIL/uL — AB (ref 4.22–5.81)
RDW: 15.7 % — ABNORMAL HIGH (ref 11.5–15.5)
WBC: 15.9 10*3/uL — AB (ref 4.0–10.5)

## 2017-10-15 LAB — MAGNESIUM: Magnesium: 2.1 mg/dL (ref 1.7–2.4)

## 2017-10-15 LAB — POCT I-STAT 3, ART BLOOD GAS (G3+)
ACID-BASE EXCESS: 6 mmol/L — AB (ref 0.0–2.0)
Bicarbonate: 30 mmol/L — ABNORMAL HIGH (ref 20.0–28.0)
O2 Saturation: 99 %
Patient temperature: 36.7
TCO2: 31 mmol/L (ref 22–32)
pCO2 arterial: 38 mmHg (ref 32.0–48.0)
pH, Arterial: 7.505 — ABNORMAL HIGH (ref 7.350–7.450)
pO2, Arterial: 116 mmHg — ABNORMAL HIGH (ref 83.0–108.0)

## 2017-10-15 LAB — COOXEMETRY PANEL
Carboxyhemoglobin: 1.6 % — ABNORMAL HIGH (ref 0.5–1.5)
Methemoglobin: 1.3 % (ref 0.0–1.5)
O2 Saturation: 60.7 %
Total hemoglobin: 6.3 g/dL — CL (ref 12.0–16.0)

## 2017-10-15 LAB — HEPATIC FUNCTION PANEL
ALBUMIN: 1.6 g/dL — AB (ref 3.5–5.0)
ALK PHOS: 75 U/L (ref 38–126)
ALT: 44 U/L (ref 17–63)
AST: 68 U/L — AB (ref 15–41)
Bilirubin, Direct: 0.5 mg/dL (ref 0.1–0.5)
Indirect Bilirubin: 1.3 mg/dL — ABNORMAL HIGH (ref 0.3–0.9)
TOTAL PROTEIN: 3.6 g/dL — AB (ref 6.5–8.1)
Total Bilirubin: 1.8 mg/dL — ABNORMAL HIGH (ref 0.3–1.2)

## 2017-10-15 LAB — POCT ACTIVATED CLOTTING TIME
ACTIVATED CLOTTING TIME: 153 s
ACTIVATED CLOTTING TIME: 153 s
ACTIVATED CLOTTING TIME: 158 s
ACTIVATED CLOTTING TIME: 263 s
Activated Clotting Time: 153 seconds
Activated Clotting Time: 153 seconds
Activated Clotting Time: 136 seconds
Activated Clotting Time: 142 seconds

## 2017-10-15 LAB — LACTATE DEHYDROGENASE: LDH: 944 U/L — ABNORMAL HIGH (ref 98–192)

## 2017-10-15 LAB — CALCIUM, IONIZED: Calcium, Ionized, Serum: 4.5 mg/dL (ref 4.5–5.6)

## 2017-10-15 LAB — HEMOGLOBIN AND HEMATOCRIT, BLOOD
HEMATOCRIT: 25.2 % — AB (ref 39.0–52.0)
HEMOGLOBIN: 8.6 g/dL — AB (ref 13.0–17.0)

## 2017-10-15 LAB — SAVE SMEAR

## 2017-10-15 LAB — HEPARIN LEVEL (UNFRACTIONATED): Heparin Unfractionated: 0.33 IU/mL (ref 0.30–0.70)

## 2017-10-15 LAB — ECHOCARDIOGRAM LIMITED
HEIGHTINCHES: 70 in
Weight: 3048.7 oz

## 2017-10-15 LAB — POCT I-STAT, CHEM 8
BUN: 85 mg/dL — AB (ref 6–20)
CALCIUM ION: 1.05 mmol/L — AB (ref 1.15–1.40)
CREATININE: 2.5 mg/dL — AB (ref 0.61–1.24)
Chloride: 99 mmol/L — ABNORMAL LOW (ref 101–111)
Glucose, Bld: 109 mg/dL — ABNORMAL HIGH (ref 65–99)
HCT: 24 % — ABNORMAL LOW (ref 39.0–52.0)
Hemoglobin: 8.2 g/dL — ABNORMAL LOW (ref 13.0–17.0)
Potassium: 4.2 mmol/L (ref 3.5–5.1)
Sodium: 136 mmol/L (ref 135–145)
TCO2: 26 mmol/L (ref 22–32)

## 2017-10-15 LAB — GLUCOSE, CAPILLARY
GLUCOSE-CAPILLARY: 164 mg/dL — AB (ref 65–99)
GLUCOSE-CAPILLARY: 137 mg/dL — AB (ref 65–99)
GLUCOSE-CAPILLARY: 162 mg/dL — AB (ref 65–99)
GLUCOSE-CAPILLARY: 133 mg/dL — AB (ref 65–99)
Glucose-Capillary: 148 mg/dL — ABNORMAL HIGH (ref 65–99)

## 2017-10-15 LAB — PREPARE RBC (CROSSMATCH)

## 2017-10-15 LAB — APTT: aPTT: 105 seconds — ABNORMAL HIGH (ref 24–36)

## 2017-10-15 MED ORDER — SODIUM CHLORIDE 0.9 % IV SOLN: Freq: Once | INTRAVENOUS | Status: AC

## 2017-10-15 MED ORDER — DEXMEDETOMIDINE HCL IN NACL 200 MCG/50ML IV SOLN: 0.0000 ug/kg/h | INTRAVENOUS | Status: DC

## 2017-10-15 MED ORDER — DEXMEDETOMIDINE HCL IN NACL 200 MCG/50ML IV SOLN
0.0000 ug/kg/h | INTRAVENOUS | Status: DC
  Filled 2017-10-15: qty 50

## 2017-10-15 MED ORDER — IOPAMIDOL (ISOVUE-300) INJECTION 61%
INTRAVENOUS | Status: AC
  Administered 2017-10-15 (×2)
  Filled 2017-10-15: qty 30

## 2017-10-15 MED ORDER — SODIUM CHLORIDE 0.9 % IV SOLN
400.0000 mg | Freq: Two times a day (BID) | INTRAVENOUS | Status: DC
  Administered 2017-10-15 – 2017-10-22 (×14): 400 mg via INTRAVENOUS
  Filled 2017-10-15 (×16): qty 400

## 2017-10-15 MED ORDER — VITAL 1.5 CAL PO LIQD
1000.0000 mL | ORAL | Status: DC
  Administered 2017-10-15 – 2017-10-27 (×16): 1000 mL
  Filled 2017-10-15 (×21): qty 1000

## 2017-10-15 MED ORDER — SODIUM CHLORIDE 0.9 % IV SOLN
0.4000 ug/kg/h | INTRAVENOUS | Status: DC
  Administered 2017-10-15 – 2017-10-18 (×16): 1.2 ug/kg/h via INTRAVENOUS
  Administered 2017-10-19: 1 ug/kg/h via INTRAVENOUS
  Administered 2017-10-19: 1.2 ug/kg/h via INTRAVENOUS
  Administered 2017-10-19: 1 ug/kg/h via INTRAVENOUS
  Administered 2017-10-19 (×2): 1.2 ug/kg/h via INTRAVENOUS
  Administered 2017-10-20 (×4): 1 ug/kg/h via INTRAVENOUS
  Administered 2017-10-20: 1.2 ug/kg/h via INTRAVENOUS
  Administered 2017-10-21: 1 ug/kg/h via INTRAVENOUS
  Administered 2017-10-21: 0.7 ug/kg/h via INTRAVENOUS
  Administered 2017-10-21 (×2): 1 ug/kg/h via INTRAVENOUS
  Administered 2017-10-22: 0.3 ug/kg/h via INTRAVENOUS
  Administered 2017-10-22: 0.7 ug/kg/h via INTRAVENOUS
  Administered 2017-10-22: 1.148 ug/kg/h via INTRAVENOUS
  Administered 2017-10-23: 0.8 ug/kg/h via INTRAVENOUS
  Administered 2017-10-23: 1.2 ug/kg/h via INTRAVENOUS
  Administered 2017-10-23: 0.8 ug/kg/h via INTRAVENOUS
  Administered 2017-10-23: 1 ug/kg/h via INTRAVENOUS
  Administered 2017-10-23: 1.2 ug/kg/h via INTRAVENOUS
  Administered 2017-10-24 (×3): 0.4 ug/kg/h via INTRAVENOUS
  Administered 2017-10-24: 1 ug/kg/h via INTRAVENOUS
  Administered 2017-10-24: 1.2 ug/kg/h via INTRAVENOUS
  Administered 2017-10-24: 0.4 ug/kg/h via INTRAVENOUS
  Administered 2017-10-24 – 2017-10-25 (×2): 1.2 ug/kg/h via INTRAVENOUS
  Administered 2017-10-25: 0.6 ug/kg/h via INTRAVENOUS
  Administered 2017-10-25 (×5): 1.2 ug/kg/h via INTRAVENOUS
  Administered 2017-10-26 (×2): 0.8 ug/kg/h via INTRAVENOUS
  Administered 2017-10-26: 1.2 ug/kg/h via INTRAVENOUS
  Administered 2017-10-26: 0.8 ug/kg/h via INTRAVENOUS
  Administered 2017-10-27 (×4): 1.2 ug/kg/h via INTRAVENOUS
  Administered 2017-10-27: 0.4 ug/kg/h via INTRAVENOUS
  Administered 2017-10-27: 0.5 ug/kg/h via INTRAVENOUS
  Administered 2017-10-27: 1.2 ug/kg/h via INTRAVENOUS
  Filled 2017-10-15 (×67): qty 4

## 2017-10-15 MED ORDER — METRONIDAZOLE IN NACL 5-0.79 MG/ML-% IV SOLN
500.0000 mg | Freq: Three times a day (TID) | INTRAVENOUS | Status: DC
  Administered 2017-10-15 – 2017-10-23 (×25): 500 mg via INTRAVENOUS
  Filled 2017-10-15 (×26): qty 100

## 2017-10-15 NOTE — Progress Notes (Signed)
Subjective: Tmax last 24 hrs 104.2.  On [employer] for cooling.  Heparin drip resumed overnight.  Remains intubated this morning.  Able to follow simple commands.  Exam: Vitals:   10/15/17 0715 10/15/17 0800  BP:    Pulse: 77   Resp: 15   Temp: 98.2 F (36.8 C) 97.9 F (36.6 C)  SpO2: 100%    Gen: lying in bed, critically ill appearing, ET tube in place Cardiac: Tachycardic rate Resp: mechanically assisted breaths, symmetric rise and fall Abd: soft, non distended  Neuro: MS: Remains on Precedex, but able to follow commands. CN: pupils slow but do react to light, EOMI, no facial droop appreciated, hearing appears intact, able to stick out his tongue Motor: He is able to move his toes and squeeze his hands by command.  He is unable to lift extremities off the bed.  Normal tone and bulk DTR: Decreased bilaterally lower extremities   Pertinent Labs: Head CT: negative for bleed Ammonia 31 Na 136, K 4.1, BUN 90, Cr 2.5, GFR 25 WBC 16, Hgb 7.8, PLT 75  Impression:  67 year old man evaluated for acute unresponsiveness yesterday in setting of multiple complex medical issues including low output heart failure s/p Impella and vasopressors, treated Strep bacteremia, acute kidney injury, atrial fibrillation with RVR on anticoagulation, and persistent high fevers.  Fortunately, CT head was negative for bleed. He remains on pressors this morning, and his fever has come down with Arctic sun cooling.  No clear source of fevers as of now.  Antibiotics are being managed by ID.  He has had repeat cultures that have been negative.  EEG did not reveal any ongoing status.  There was possible evidence of epileptogenic zone in the left parietocentral region, though this could represent artifact.  Given his high fevers at time of evaluation and low blood pressures, this episode yesterday may have represented a hypoperfusion event as opposed to partial seizure.  However, he was loaded with Keppra and started  on BID dosing in the event that his was the case.  His exam today is improved and he is able to follow simple commands and track with his eyes while remaining on Precedex for some sedation.  Recommendations: 1) Continue Keppra 500mg  BID for now given his critical illness.  May be able to stop if unresponsive episode felt ultimately to be more consistent with hypoperfusion event.  Will anticipate repeating EEG in a couple days to help with this determination   Gwynn Burly, PGY3 10/15/2017, 8:17 AM [facility] Medicine Pager: [phone]

## 2017-10-15 NOTE — Procedures (Signed)
Arterial Catheter Insertion Procedure Note Bryan Harmon [id number] 02/22/1951  Procedure: Insertion of Arterial Catheter  Indications: Blood pressure monitoring and Frequent blood sampling  Procedure Details Consent: Risks of procedure as well as the alternatives and risks of each were explained to the (patient/caregiver).  Consent for procedure obtained. Time Out: Verified patient identification, verified procedure, site/side was marked, verified correct patient position, special equipment/implants available, medications/allergies/relevent history reviewed, required imaging and test results available.  Performed  Maximum sterile technique was used including antiseptics, cap, gloves, gown, hand hygiene, mask and sheet. Skin prep: Chlorhexidine; local anesthetic administered 20 gauge catheter was inserted into right femoral artery using the Seldinger technique.  Evaluation Blood flow good; BP tracing good. Complications: No apparent complications.   Brett Canales Minor ACNP [facility] PCC[facility] Pager [phone] till 3 pm If no answer page [phone] 10/15/2017, 11:02 AM

## 2017-10-15 NOTE — Plan of Care (Signed)
  Cardiac: Ability to achieve and maintain adequate cardiopulmonary perfusion will improve 10/15/2017 1759 - Progressing by Karren Burly, RN

## 2017-10-15 NOTE — Progress Notes (Signed)
PULMONARY / CRITICAL CARE MEDICINE   Name: TAMARION HAYMOND MRN: [id number] DOB: 06-10-1951    ADMISSION DATE:  09/26/2017  REFERRING MD:  Nils Pyle  CHIEF COMPLAINT:  Fatigue  HISTORY OF PRESENT ILLNESS:   67 y/o male with non-ischemic cardiomyopathy and afib admitted with decompensated heart failure.  Required Impella, vent.     Lines/ Tubes 1/30 L IJ CVL > 1/31 1/30 R IJ swan > 1/30 R sub clav impella > 1/30 L radial arterial line > 1/30 ETT >  1/31 L Touchet TLC >   Cultures: 1/21 Blood >> Strep Pneumoniae >> pan sens 1/21 Resp Viral Panel >> + Parainfluenza Virus 3 1/28 blood > ng 1/30 blood > ng 1/31 resp culture > ng 2/1 BAL > candida 10/15/2017 blood cultures x2>> 10/15/2017 tracheal aspirate>> 10/15/2017 urine culture>>  ABX: Infectious disease consult 10/14/2017 Rocephin 1/21 >> 1/29 Doxy 1/21 >> 1/23 Vanc 1/30 >> 10/14/2017 Mero 1/30 >>  Eraxis 2/2 >> off   Studies: 10/01/2017>> Echo EF 15-20%, Apex ? PAP muscle vs. Thrombus, LV severely dilated, moderate concentric hypertrophy, systolic function normal, unable to evaluate LV diastolic function due to atrial fibrillation. + moderate spontaneous echo contrast, indicative of stasis Impression 1/23 Echo > Severely dilated LV with severe LV dysfunction globally with EF 15-20%. Severely dialted RV with severe RV dysfunction. Moderate to severe MR with ERO 0.33cm2 and MR volume 41m. Moderate tosevere TR with moderate pulmonary HTN. Moderately thickened and calcified AV leaflets with mild MR, mildly dilated aortic root, massive biatrial enlargement. Cannot rule out LV thrombus. Thereis significant spontaneous echo contrast in LV c/w sluggish blood flow. The right ventricular systolic pressure was increasedconsistent with moderate pulmonary hypertension. 1/29 CT chest > dense bilateral lower lobe consolidation R>L 1/30 TEE >> LVEF 20-25%, impella in place, poor overall contractility but LV improved    SUBJECTIVE:    Fever 105.6, currently on Arctic sun, seizure during the night suspected secondary to febrile state.  Remains on multiple pressors, mechanical assist devices and inotropes.  VITAL SIGNS: BP (!) 85/71   Pulse 77   Temp 98.2 F (36.8 C)   Resp 15   Ht _0  (1.778 m)   Wt 86.4 kg (190 lb 8.7 oz)   SpO2 100%   BMI 27.34 kg/m   HEMODYNAMICS: PAP: (23-52)/(14-29) 29/23 CVP:  [4 mmHg-17 mmHg] 7 mmHg CO:  [3.7 L/min-4.6 L/min] 3.7 L/min CI:  [1.9 L/min/m2-2.3 L/min/m2] 1.9 L/min/m2  VENTILATOR SETTINGS: Vent Mode: PCV FiO2 (%):  [40 %] 40 % Set Rate:  [8 bmp-15 bmp] 15 bmp PEEP:  [5 cmH20] 5 cmH20 Pressure Support:  [10 cmH20] 10 cmH20  INTAKE / OUTPUT: I/O last 3 completed shifts: In: 9251.9 [I.V.:4698.3; Blood:1642.8; Other:213.1; NPY/KD:9833.8 IV Piggyback:560] Out: 52505[Urine:3330; Chest Tube:1860]  PHYSICAL EXAMINATION: General: Grossly volume overloaded male sedated on vent HEENT: Endotracheal tube to ventilator, core tract tube feedings. PSY: Sedated Neuro: Sedated CV: Heart sounds are distant, on vasopressors and inotropes PULM: Pressure control ventilation with decreased breath sounds in the bases Right chest tube in place with no air leak, with variable output of 0-110 cc/h, remains on heparin drip GLZ:JQBH non-tender, bsx4 active tube feeding at goal Extremities: 4+ edema, extremities cool to touch.  Right axillary Impela. Note: Left radial A-line site 3 cm circumference of irritation around the catheter site. Skin: Cool to touch   LABS:  BMET Recent Labs  Lab 10/13/17 0253  10/14/17 0240 10/14/17 1425 10/15/17 0302  NA 135   < >  139 139 136  K 3.9   < > 3.8 4.8 4.1  CL 96*   < > 102 98* 101  CO2 29  --  26  --  23  BUN 79*   < > 74* 72* 90*  CREATININE 2.38*   < > 2.22* 2.20* 2.53*  GLUCOSE 130*   < > 132* 146* 179*   < > = values in this interval not displayed.    Electrolytes Recent Labs  Lab 10/10/17 0250 10/10/17 1741 10/11/17 0355   10/13/17 0253 10/14/17 0240 10/15/17 0302  CALCIUM 7.8* 7.6* 7.7*   < > 7.2* 7.2* 7.3*  MG 2.2 2.1 2.0   < > 2.0 2.1 2.1  PHOS 3.5 3.0 3.2  --   --   --   --    < > = values in this interval not displayed.    CBC Recent Labs  Lab 10/14/17 1400 10/14/17 1425 10/14/17 1743 10/15/17 0302  WBC 13.6*  --  13.4* 15.9*  HGB 8.1* 7.8* 8.4* 7.8*  HCT 24.0* 23.0* 25.0* 23.6*  PLT 94*  --  81* 75*    Coag's Recent Labs  Lab 10/13/17 0253 10/14/17 0239 10/15/17 0302  APTT 121* 115* 105*    Sepsis Markers Recent Labs  Lab 10/10/17 0926 10/11/17 0355 10/12/17 0359  PROCALCITON 8.49 6.65 5.76    ABG Recent Labs  Lab 10/14/17 0430 10/14/17 1429 10/15/17 0628  PHART 7.482* 7.438 7.505*  PCO2ART 41.9 43.6 38.0  PO2ART 116* 134.0* 116.0*    Liver Enzymes Recent Labs  Lab 10/12/17 0359 10/13/17 0253 10/14/17 0240  AST 55* 65* 63*  ALT 83* 69* 60  ALKPHOS 75 76 69  BILITOT 1.1 1.2 1.2  ALBUMIN 2.0* 1.8* 1.7*    Cardiac Enzymes No results for input(s): TROPONINI, PROBNP in the last 168 hours.  Glucose Recent Labs  Lab 10/14/17 0749 10/14/17 1153 10/14/17 1645 10/14/17 1956 10/14/17 2327 10/15/17 0417  GLUCAP 132* 155* 131* 138* 164* 137*    Imaging Ct Head Wo Contrast  Result Date: 10/14/2017 CLINICAL DATA:  Altered level of consciousness, patient went unresponsive, rolled eyes up, jerking movements of LEFT upper arm EXAM: CT HEAD WITHOUT CONTRAST TECHNIQUE: Contiguous axial images were obtained from the base of the skull through the vertex without intravenous contrast. Sagittal and coronal MPR images reconstructed from axial data set. COMPARISON:  None FINDINGS: Brain: Scattered motion artifacts. Normal ventricular morphology. No midline shift or mass effect. Small vessel chronic ischemic changes of deep cerebral white matter. No definite intracranial hemorrhage, mass lesion, or evidence of acute infarction within limitations of motion. No definite  extra-axial fluid collections. Vascular: Atherosclerotic calcifications of internal carotid and vertebral arteries at skull base Skull: No gross abnormality identified Sinuses/Orbits: Air-fluid level in LEFT maxillary sinus Other: N/A IMPRESSION: Atrophy with small vessel chronic ischemic changes of deep cerebral white matter. No definite acute intracranial abnormalities identified on exam limited by motion. LEFT maxillary sinus air-fluid level. Electronically Signed   By: Lavonia Dana M.D.   On: 10/14/2017 16:24   Dg Chest Port 1 View  Result Date: 10/15/2017 CLINICAL DATA:  Hypertension. Respiratory failure. Left ventricular assist device. EXAM: PORTABLE CHEST 1 VIEW COMPARISON:  Yesterday FINDINGS: Endotracheal tube terminates 2.5 cm above carina.Swan-Ganz catheter from a right internal jugular approach is unchanged with tip in the proximal right pulmonary artery. A left-sided subclavian line terminates over the high SVC. Feeding tube extends beyond the inferior aspect of the film. Similar position of  Impella device terminating over the left ventricle. Right chest tube is unchanged in position. Cardiomegaly accentuated by AP portable technique. Layering right pleural effusion and probable small left pleural effusion, similar. Decrease in less than 5% right apical pneumothorax. Similar bibasilar airspace disease. Mild pulmonary venous congestion is asymmetric, greater on the right. IMPRESSION: Right chest tube remaining in place, with decrease in less than 5% right apical pneumothorax. Otherwise, similar appearance of the chest with interstitial edema, layering pleural effusions, and bibasilar airspace disease. Electronically Signed   By: Abigail Miyamoto M.D.   On: 10/15/2017 07:43   Dg Chest Port 1 View  Result Date: 10/14/2017 CLINICAL DATA:  Difficulty breathing EXAM: PORTABLE CHEST 1 VIEW COMPARISON:  10/14/2017 FINDINGS: Endotracheal tube has been placed in good position. Swan-Ganz catheter right pulmonary  artery. Left subclavian central venous catheter tip in the mid SVC. Impella device in the left ventricle unchanged. Right chest tube in place. Probable small right apical pneumothorax. Moderate right effusion unchanged. Right lower lobe atelectasis/infiltrate unchanged. Left lower lobe airspace disease unchanged. IMPRESSION: Interval intubation with endotracheal tube in good position Probable small right apical pneumothorax Bibasilar airspace disease and bilateral effusions unchanged. Electronically Signed   By: Franchot Gallo M.D.   On: 10/14/2017 15:08      DISCUSSION: 67 y/o male with ischemic cardiomyopathy admitted with decompensated systolic heart failure in the setting of strep pneumo pneumonia and bacteremia s/p impella.  He has acute on chronic kidney failure, cardiogenic shock.  10/15/2017 with fever 105.6, pancultured 01/2018, ID consult 10/14/2017. 's overall cardiac function has declined despite all interventions.  Family wants to continue with maximum interventions.  ASSESSMENT / PLAN:  PULMONARY A: Acute respiratory failure with hypoxemia Pneumococcal CAP - RLL  Acute pulmonary edema Bilateral pleural effusions , R >> L P:   Continue pressure control ventilation for which she is tolerating well. We will not push weaning due to his febrile state, suspected seizures, and low cardiac output state.  CARDIOVASCULAR A:  Acute decompensated cardiomyopathy > severe biventricular failure, LVEF ~ 5% S/p Impella 1/30 Cardiogenic Shock  Medical Non-Compliance - stopped taking all meds prior to admit for CHF, ? Long term plan 10/11/2017 right chest tube P:  Volume management, inotrope management as per advanced heart failure team plans, TCTS plans.  Ct levophed/ milrinone gtt Amiodarone drip Poor prognosis with worsening multiorgan dysfunction as of 10/15/2017 Right chest tube continues to drain and he is on a heparin drip. Consider DC of the left radial A-line in place with femoral if we  continue aggressive care.   RENAL Lab Results  Component Value Date   CREATININE 2.53 (H) 10/15/2017   CREATININE 2.20 (H) 10/14/2017   CREATININE 2.22 (H) 10/14/2017   CREATININE 1.11 03/25/2012   Recent Labs  Lab 10/14/17 0240 10/14/17 1425 10/15/17 0302  K 3.8 4.8 4.1    A:   Worsening renal failure with creatinine 2.53 on 10/15/2017 Hypokalemia/hypervolemic P:   Continue to follow urine output, BMP Replace electrolytes as indicated Avoid nephrotoxic agents, ensure adequate renal perfusion  GASTROINTESTINAL A:   No acute issues P:   Tube feeding per nutrition Pepcid IV for ulcer prophylaxis  HEMATOLOGIC Recent Labs    10/14/17 1743 10/15/17 0302  HGB 8.4* 7.8*   Lab Results  Component Value Date   INR 1.33 09/28/2017   INR 1.10 07/05/2016   INR 0.98 10/08/2012    A:   Anemia, thrombocytopenia - blood loss via chest tube Concern for hemolytic anemia with  impella, note high LDH P:  PRBCs per CHF service, goal Hb 8 & above Transfuse 4 units of packed cells 10/14/2017 Follow CBC and transfusion per cardiology service with 2 units of packed cells on 10/15/2017  INFECTIOUS A:   Strep Pneumonia Bacteremia - 1/21 - completed 10 ds of rocephin Severe CAP HCAP? >  No evidence thus far based on BAL from 2/1 Fever 1/30 - new, considering RLL, sinusitis with NGT, multiple lines Parainfluenza virus pneumonia P:   Vancomycin discontinued on 10/14/2017 Continue meropenem Infectious disease consult 10/14/2017 Suspect that yeast in BAL is a contaminant, empiric  Consider drug fever - ID consulted ,note pct was high Note 10/15/2017 fever was 105.6 he was placed on Arctic sun for cooling. 10/15/2017 due to fever spike multiple invasive catheters we will pan culture 10/15/2017  ENDOCRINE CBG (last 3)  Recent Labs    10/14/17 1956 10/14/17 2327 10/15/17 0417  GLUCAP 138* 164* 137*    A:   Hyperglycemia  P:   Sliding scale insulin per protocol  NEUROLOGIC A:    Sedation Need / Mechanical Ventilation  Suspected seizure 10/14/2017 neurology consult Suspect he may be secondary to febrile state. EEG performed P:   RASS goal: -1  Sedation protocol: -1> continue precedex, PRN fentanyl, versed  Neurology consult as noted. EEG with no seizure activity noted.  FAMILY  - Updates: 10/15/2017 family updated at bedside.  Despite the fact he is declined during the night and is showing no improvement they remain hopeful that he will recover.  Tracheostomy was not brought up at this time.  App CCT 45 min    Richardson Landry Minor ACNP Maryanna Shape PCCM Pager [phone] till 1 pm If no answer page [phone] 10/15/2017, 8:04 AM

## 2017-10-15 NOTE — Progress Notes (Signed)
  Echocardiogram 2D Echocardiogram has been performed.  Bryan Harmon 10/15/2017, 10:05 AM

## 2017-10-15 NOTE — Progress Notes (Signed)
Patient was transported to CT & back to 2H20 without any complications.  

## 2017-10-15 NOTE — Progress Notes (Signed)
Subjective: Patient following some commands  Family at bedside  Antibiotics:  Anti-infectives (From admission, onward)   Start     Dose/Rate Route Frequency Ordered Stop   10/13/17 1200  anidulafungin (ERAXIS) 100 mg in sodium chloride 0.9 % 100 mL IVPB  Status:  Discontinued     100 mg 78 mL/hr over 100 Minutes Intravenous Every 24 hours 10/12/17 1123 10/14/17 1238   10/12/17 1200  anidulafungin (ERAXIS) 200 mg in sodium chloride 0.9 % 200 mL IVPB     200 mg 78 mL/hr over 200 Minutes Intravenous  Once 10/12/17 1123 10/12/17 1702   09/23/2017 2000  vancomycin (VANCOCIN) IVPB 1000 mg/200 mL premix  Status:  Discontinued     1,000 mg 200 mL/hr over 60 Minutes Intravenous Every 24 hours 10/05/2017 1754 10/14/17 0951   09/14/2017 1800  meropenem (MERREM) 1 g in sodium chloride 0.9 % 100 mL IVPB     1 g 200 mL/hr over 30 Minutes Intravenous Every 12 hours 09/29/2017 1720     09/20/2017 0912  vancomycin (VANCOCIN) 1,000 mg in sodium chloride 0.9 % 1,000 mL irrigation  Status:  Discontinued       As needed 09/16/2017 0912 09/21/2017 1115   09/15/2017 0700  fluconazole (DIFLUCAN) IVPB 400 mg  Status:  Discontinued     400 mg 100 mL/hr over 120 Minutes Intravenous To Surgery 09/15/2017 0650 09/27/2017 1115   09/22/2017 0700  rifampin (RIFADIN) 600 mg in sodium chloride 0.9 % 100 mL IVPB  Status:  Discontinued     600 mg 200 mL/hr over 30 Minutes Intravenous To Surgery 09/28/2017 0650 09/16/2017 1115   09/28/2017 0700  vancomycin (VANCOCIN) powder 1,000 mg  Status:  Discontinued     1,000 mg Other To Surgery 09/11/2017 0650 09/11/2017 1115   09/25/2017 0400  vancomycin (VANCOCIN) 1,250 mg in sodium chloride 0.9 % 250 mL IVPB     1,250 mg 166.7 mL/hr over 90 Minutes Intravenous To Surgery 09/14/2017 1910 09/14/2017 1222   09/13/2017 0400  cefUROXime (ZINACEF) 1.5 g in dextrose 5 % 50 mL IVPB     1.5 g 100 mL/hr over 30 Minutes Intravenous To Surgery 10/10/2017 1910 09/17/2017 1202   09/17/2017 0400  cefUROXime (ZINACEF) 750  mg in dextrose 5 % 50 mL IVPB  Status:  Discontinued     750 mg 100 mL/hr over 30 Minutes Intravenous To Surgery 09/20/2017 1910 09/26/2017 1115   10/01/17 0600  cefTRIAXone (ROCEPHIN) 2 g in dextrose 5 % 50 mL IVPB  Status:  Discontinued     2 g 100 mL/hr over 30 Minutes Intravenous Every 24 hours 10/01/17 0541 09/13/2017 1705   09/16/2017 1315  doxycycline (VIBRA-TABS) tablet 100 mg  Status:  Discontinued     100 mg Oral Every 12 hours 09/24/2017 1301 10/02/17 1346   09/26/2017 1315  cefTRIAXone (ROCEPHIN) 1 g in dextrose 5 % 50 mL IVPB  Status:  Discontinued     1 g 100 mL/hr over 30 Minutes Intravenous Every 24 hours 09/21/2017 1301 10/01/17 0541     Medications: Scheduled Meds: . chlorhexidine gluconate (MEDLINE KIT)  15 mL Mouth Rinse BID  . Chlorhexidine Gluconate Cloth  6 each Topical Daily  . feeding supplement (PRO-STAT SUGAR FREE 64)  30 mL Per Tube TID  . Gerhardt's butt cream   Topical BID  . insulin aspart  2-6 Units Subcutaneous Q4H  . insulin glargine  12 Units Subcutaneous BID  . mouth rinse  15  mL Mouth Rinse 10 times per day  . sennosides  5 mL Oral Daily  . sodium chloride flush  3 mL Intravenous Q12H   Continuous Infusions: . sodium chloride 20 mL/hr at 10/15/17 0608  . sodium chloride Stopped (10/15/17 0220)  . sodium chloride    . sodium chloride    . amiodarone 30 mg/hr (10/15/17 0820)  . dexmedetomidine (PRECEDEX) IV infusion 1.2 mcg/kg/hr (10/15/17 0600)  . dextrose 5 % Impella 5.0 Purge solution 4 mL/hr at 10/13/17 1808  . famotidine (PEPCID) IV Stopped (10/14/17 1035)  . feeding supplement (VITAL 1.5 CAL) 1,000 mL (10/15/17 0600)  . impella catheter heparin 50 unit/mL in dextrose 5%    . heparin 1,600 Units/hr (10/15/17 0600)  . levETIRAcetam Stopped (10/15/17 0525)  . meropenem (MERREM) IV Stopped (10/15/17 3785)  . milrinone 0.375 mcg/kg/min (10/15/17 0600)  . norepinephrine (LEVOPHED) Adult infusion 18 mcg/min (10/15/17 0745)   PRN Meds:.sodium chloride,  acetaminophen (TYLENOL) oral liquid 160 mg/5 mL, albuterol, fentaNYL (SUBLIMAZE) injection, fentaNYL (SUBLIMAZE) injection, magic mouthwash, midazolam, morphine injection, ondansetron (ZOFRAN) IV, sodium chloride flush, sodium chloride flush  Objective: Weight change: 15.2 oz (0.43 kg)  Intake/Output Summary (Last 24 hours) at 10/15/2017 8850 Last data filed at 10/15/2017 0800 Gross per 24 hour  Intake 5098.13 ml  Output 3225 ml  Net 1873.13 ml   Blood pressure (!) 85/71, pulse 77, temperature 97.9 F (36.6 C), resp. rate 15, height _0  (1.778 m), weight 190 lb 8.7 oz (86.4 kg), SpO2 100 %. Temp:  [96.4 F (35.8 C)-104.2 F (40.1 C)] 97.9 F (36.6 C) (02/05 0800) Pulse Rate:  [25-131] 77 (02/05 0715) Resp:  [12-36] 15 (02/05 0715) BP: (63-127)/(42-107) 85/71 (02/05 0700) SpO2:  [54 %-100 %] 100 % (02/05 0715) Arterial Line BP: (71-120)/(50-95) 99/75 (02/05 0715) FiO2 (%):  [40 %] 40 % (02/05 0316) Weight:  [190 lb 8.7 oz (86.4 kg)] 190 lb 8.7 oz (86.4 kg) (02/05 0430)  Physical Exam: General: Trach in place. Follows some commands HEENT: anicteric sclera, pupils reactive to light and accommodation, EOMI CVS tachycardic,  no murmur rubs or gallops Chest: Chest tube in place, diminished breath sounds on the right with faint crackles  Abdomen: soft nontender, nondistended, normal bowel sounds, Extremities: no clubbing or edema noted bilaterally Skin: no rashes Lymph: no new lymphadenopathy  CBC: _1 (wbc3,Hgb:3,Hct:3,Plt:3,INR:3APTT:3)@  BMET Recent Labs    10/14/17 0240 10/14/17 1425 10/15/17 0302  NA 139 139 136  K 3.8 4.8 4.1  CL 102 98* 101  CO2 26  --  23  GLUCOSE 132* 146* 179*  BUN 74* 72* 90*  CREATININE 2.22* 2.20* 2.53*  CALCIUM 7.2*  --  7.3*   Liver Panel  Recent Labs    10/13/17 0253 10/14/17 0240  PROT 4.4* 4.2*  ALBUMIN 1.8* 1.7*  AST 65* 63*  ALT 69* 60  ALKPHOS 76 69  BILITOT 1.2 1.2   Sedimentation Rate No results for input(s):  ESRSEDRATE in the last 72 hours. C-Reactive Protein No results for input(s): CRP in the last 72 hours.  Micro Results: Recent Results (from the past 720 hour(s))  Culture, blood (Routine X 2) w Reflex to ID Panel     Status: Abnormal   Collection Time: 10/04/2017  1:08 PM  Result Value Ref Range Status   Specimen Description BLOOD RIGHT HAND  Final   Special Requests   Final    BOTTLES DRAWN AEROBIC AND ANAEROBIC Blood Culture adequate volume   Culture  Setup Time  Final    GRAM POSITIVE COCCI AEROBIC BOTTLE ONLY CRITICAL RESULT CALLED TO, READ BACK BY AND VERIFIED WITH: J LEDFORD PHARMD 0532 10/01/17 A BROWNING    Culture STREPTOCOCCUS PNEUMONIAE (A)  Final   Report Status 10/03/2017 FINAL  Final   Organism ID, Bacteria STREPTOCOCCUS PNEUMONIAE  Final      Susceptibility   Streptococcus pneumoniae - MIC*    ERYTHROMYCIN <=0.12 SENSITIVE Sensitive     LEVOFLOXACIN 0.5 SENSITIVE Sensitive     PENICILLIN (non-meningitis) 0.25 SENSITIVE Sensitive     CEFTRIAXONE (non-meningitis) <=0.12 SENSITIVE Sensitive     * STREPTOCOCCUS PNEUMONIAE  Blood Culture ID Panel (Reflexed)     Status: Abnormal   Collection Time: 10/01/2017  1:08 PM  Result Value Ref Range Status   Enterococcus species NOT DETECTED NOT DETECTED Final   Listeria monocytogenes NOT DETECTED NOT DETECTED Final   Staphylococcus species NOT DETECTED NOT DETECTED Final   Staphylococcus aureus NOT DETECTED NOT DETECTED Final   Streptococcus species DETECTED (A) NOT DETECTED Final    Comment: CRITICAL RESULT CALLED TO, READ BACK BY AND VERIFIED WITH: J LEDFORD PHARMD 0532 10/01/17 A BROWNING    Streptococcus agalactiae NOT DETECTED NOT DETECTED Final   Streptococcus pneumoniae DETECTED (A) NOT DETECTED Final    Comment: CRITICAL RESULT CALLED TO, READ BACK BY AND VERIFIED WITH: J LEDFORD PHARMD 0532 10/01/17 A BROWNING    Streptococcus pyogenes NOT DETECTED NOT DETECTED Final   Acinetobacter baumannii NOT DETECTED NOT DETECTED  Final   Enterobacteriaceae species NOT DETECTED NOT DETECTED Final   Enterobacter cloacae complex NOT DETECTED NOT DETECTED Final   Escherichia coli NOT DETECTED NOT DETECTED Final   Klebsiella oxytoca NOT DETECTED NOT DETECTED Final   Klebsiella pneumoniae NOT DETECTED NOT DETECTED Final   Proteus species NOT DETECTED NOT DETECTED Final   Serratia marcescens NOT DETECTED NOT DETECTED Final   Haemophilus influenzae NOT DETECTED NOT DETECTED Final   Neisseria meningitidis NOT DETECTED NOT DETECTED Final   Pseudomonas aeruginosa NOT DETECTED NOT DETECTED Final   Candida albicans NOT DETECTED NOT DETECTED Final   Candida glabrata NOT DETECTED NOT DETECTED Final   Candida krusei NOT DETECTED NOT DETECTED Final   Candida parapsilosis NOT DETECTED NOT DETECTED Final   Candida tropicalis NOT DETECTED NOT DETECTED Final  Culture, blood (Routine X 2) w Reflex to ID Panel     Status: None   Collection Time: 09/26/2017  1:11 PM  Result Value Ref Range Status   Specimen Description BLOOD LEFT FOREARM  Final   Special Requests IN PEDIATRIC BOTTLE Blood Culture adequate volume  Final   Culture NO GROWTH 5 DAYS  Final   Report Status 10/05/2017 FINAL  Final  Respiratory Panel by PCR     Status: Abnormal   Collection Time: 09/29/2017  1:51 PM  Result Value Ref Range Status   Adenovirus NOT DETECTED NOT DETECTED Final   Coronavirus 229E NOT DETECTED NOT DETECTED Final   Coronavirus HKU1 NOT DETECTED NOT DETECTED Final   Coronavirus NL63 NOT DETECTED NOT DETECTED Final   Coronavirus OC43 NOT DETECTED NOT DETECTED Final   Metapneumovirus NOT DETECTED NOT DETECTED Final   Rhinovirus / Enterovirus NOT DETECTED NOT DETECTED Final   Influenza A NOT DETECTED NOT DETECTED Final   Influenza B NOT DETECTED NOT DETECTED Final   Parainfluenza Virus 1 NOT DETECTED NOT DETECTED Final   Parainfluenza Virus 2 NOT DETECTED NOT DETECTED Final   Parainfluenza Virus 3 DETECTED (A) NOT DETECTED Final  Parainfluenza  Virus 4 NOT DETECTED NOT DETECTED Final   Respiratory Syncytial Virus NOT DETECTED NOT DETECTED Final   Bordetella pertussis NOT DETECTED NOT DETECTED Final   Chlamydophila pneumoniae NOT DETECTED NOT DETECTED Final   Mycoplasma pneumoniae NOT DETECTED NOT DETECTED Final  Culture, blood (routine x 2)     Status: None   Collection Time: 09/16/2017  4:45 PM  Result Value Ref Range Status   Specimen Description BLOOD LEFT HAND  Final   Special Requests IN PEDIATRIC BOTTLE Blood Culture adequate volume  Final   Culture NO GROWTH 5 DAYS  Final   Report Status 10/05/2017 FINAL  Final  MRSA PCR Screening     Status: None   Collection Time: 10/04/2017  4:49 PM  Result Value Ref Range Status   MRSA by PCR NEGATIVE NEGATIVE Final    Comment:        The GeneXpert MRSA Assay (FDA approved for NASAL specimens only), is one component of a comprehensive MRSA colonization surveillance program. It is not intended to diagnose MRSA infection nor to guide or monitor treatment for MRSA infections.   Culture, blood (routine x 2)     Status: None   Collection Time: 09/23/2017  5:06 PM  Result Value Ref Range Status   Specimen Description BLOOD RIGHT HAND  Final   Special Requests IN PEDIATRIC BOTTLE Blood Culture adequate volume  Final   Culture NO GROWTH 5 DAYS  Final   Report Status 10/05/2017 FINAL  Final  Culture, blood (Routine X 2) w Reflex to ID Panel     Status: None   Collection Time: 10/06/2017  4:18 PM  Result Value Ref Range Status   Specimen Description BLOOD RIGHT ANTECUBITAL  Final   Special Requests IN PEDIATRIC BOTTLE Blood Culture adequate volume  Final   Culture   Final    NO GROWTH 5 DAYS Performed at [facility], [place] [address], [place], [place] [address]    Report Status 10/12/2017 FINAL  Final  Culture, blood (Routine X 2) w Reflex to ID Panel     Status: None   Collection Time: 09/25/2017  4:20 PM  Result Value Ref Range Status   Specimen Description BLOOD RIGHT  ANTECUBITAL  Final   Special Requests IN PEDIATRIC BOTTLE Blood Culture adequate volume  Final   Culture   Final    NO GROWTH 5 DAYS Performed at [facility], [place] [address], [place], [place] [address]    Report Status 10/12/2017 FINAL  Final  Surgical pcr screen     Status: Abnormal   Collection Time: 10/04/2017  8:37 PM  Result Value Ref Range Status   MRSA, PCR RESULT CALLED TO, READ BACK BY AND VERIFIED WITH: (A) NEGATIVE Final    Comment:  M AMO RN 10/02/2017 0504 JDW   Staphylococcus aureus INVALID RESULTS, SPECIMEN SENT FOR CULTURE (A) NEGATIVE Final    Comment: Results Called to:  M [facility] 09/11/2017 0504 JDW (NOTE) The Xpert SA Assay (FDA approved for NASAL specimens in patients 40 years of age and older), is one component of a comprehensive surveillance program. It is not intended to diagnose infection nor to guide or monitor treatment.   MRSA culture     Status: None   Collection Time: 10/10/2017  8:37 PM  Result Value Ref Range Status   Specimen Description NASOPHARYNGEAL  Final   Special Requests NONE  Final   Culture NO MRSA DETECTED  Final   Report Status  10/10/2017 FINAL  Final  Culture, blood (routine x 2)     Status: None   Collection Time: 09/29/2017  5:52 PM  Result Value Ref Range Status   Specimen Description BLOOD RIGHT ANTECUBITAL  Final   Special Requests IN PEDIATRIC BOTTLE Blood Culture adequate volume  Final   Culture   Final    NO GROWTH 5 DAYS Performed at [facility], [place] [address], [place], [place] [address]    Report Status 10/14/2017 FINAL  Final  Culture, blood (routine x 2)     Status: None   Collection Time: 09/25/2017  6:00 PM  Result Value Ref Range Status   Specimen Description BLOOD RIGHT HAND  Final   Special Requests IN PEDIATRIC BOTTLE Blood Culture adequate volume  Final   Culture   Final    NO GROWTH 5 DAYS Performed at [facility], [place] [address] [place], [place] [address]    Report Status 10/14/2017 FINAL  Final    Culture, respiratory (NON-Expectorated)     Status: None   Collection Time: 10/10/17  7:55 AM  Result Value Ref Range Status   Specimen Description TRACHEAL ASPIRATE  Final   Special Requests NONE  Final   Gram Stain   Final    RARE WBC PRESENT, PREDOMINANTLY MONONUCLEAR NO ORGANISMS SEEN Performed at [facility], [place] [address] [place], [place] [address]    Culture RARE CANDIDA ALBICANS  Final   Report Status 10/12/2017 FINAL  Final  Culture, bal-quantitative     Status: Abnormal   Collection Time: 10/11/17 11:11 AM  Result Value Ref Range Status   Specimen Description BRONCHIAL ALVEOLAR LAVAGE  Final   Special Requests NONE  Final   Gram Stain   Final    MODERATE WBC PRESENT,BOTH PMN AND MONONUCLEAR NO SQUAMOUS EPITHELIAL CELLS SEEN RARE BUDDING YEAST SEEN Performed at [facility], [place] [address] [place], [place] [address]    Culture 40,000 COLONIES/mL CANDIDA ALBICANS (A)  Final   Report Status 10/13/2017 FINAL  Final   Studies/Results: Ct Head Wo Contrast  Result Date: 10/14/2017 CLINICAL DATA:  Altered level of consciousness, patient went unresponsive, rolled eyes up, jerking movements of LEFT upper arm EXAM: CT HEAD WITHOUT CONTRAST TECHNIQUE: Contiguous axial images were obtained from the base of the skull through the vertex without intravenous contrast. Sagittal and coronal MPR images reconstructed from axial data set. COMPARISON:  None FINDINGS: Brain: Scattered motion artifacts. Normal ventricular morphology. No midline shift or mass effect. Small vessel chronic ischemic changes of deep cerebral white matter. No definite intracranial hemorrhage, mass lesion, or evidence of acute infarction within limitations of motion. No definite extra-axial fluid collections. Vascular: Atherosclerotic calcifications of internal carotid and vertebral arteries at skull base Skull: No gross abnormality identified Sinuses/Orbits: Air-fluid level in LEFT maxillary sinus Other: N/A  IMPRESSION: Atrophy with small vessel chronic ischemic changes of deep cerebral white matter. No definite acute intracranial abnormalities identified on exam limited by motion. LEFT maxillary sinus air-fluid level. Electronically Signed   By: Lavonia Dana M.D.   On: 10/14/2017 16:24   Dg Chest Port 1 View  Result Date: 10/15/2017 CLINICAL DATA:  Hypertension. Respiratory failure. Left ventricular assist device. EXAM: PORTABLE CHEST 1 VIEW COMPARISON:  Yesterday FINDINGS: Endotracheal tube terminates 2.5 cm above carina.Swan-Ganz catheter from a right internal jugular approach is unchanged with tip in the proximal right pulmonary artery. A left-sided subclavian line terminates over the high SVC. Feeding tube extends  beyond the inferior aspect of the film. Similar position of Impella device terminating over the left ventricle. Right chest tube is unchanged in position. Cardiomegaly accentuated by AP portable technique. Layering right pleural effusion and probable small left pleural effusion, similar. Decrease in less than 5% right apical pneumothorax. Similar bibasilar airspace disease. Mild pulmonary venous congestion is asymmetric, greater on the right. IMPRESSION: Right chest tube remaining in place, with decrease in less than 5% right apical pneumothorax. Otherwise, similar appearance of the chest with interstitial edema, layering pleural effusions, and bibasilar airspace disease. Electronically Signed   By: Abigail Miyamoto M.D.   On: 10/15/2017 07:43   Dg Chest Port 1 View  Result Date: 10/14/2017 CLINICAL DATA:  Difficulty breathing EXAM: PORTABLE CHEST 1 VIEW COMPARISON:  10/14/2017 FINDINGS: Endotracheal tube has been placed in good position. Swan-Ganz catheter right pulmonary artery. Left subclavian central venous catheter tip in the mid SVC. Impella device in the left ventricle unchanged. Right chest tube in place. Probable small right apical pneumothorax. Moderate right effusion unchanged. Right lower  lobe atelectasis/infiltrate unchanged. Left lower lobe airspace disease unchanged. IMPRESSION: Interval intubation with endotracheal tube in good position Probable small right apical pneumothorax Bibasilar airspace disease and bilateral effusions unchanged. Electronically Signed   By: Franchot Gallo M.D.   On: 10/14/2017 15:08   Dg Chest Port 1 View  Result Date: 10/14/2017 CLINICAL DATA:  Heart failure EXAM: PORTABLE CHEST 1 VIEW COMPARISON:  2/3/9 FINDINGS: Endotracheal tube, feeding catheter and Swan-Ganz catheter are again seen. Left subclavian central line is again noted as well. The endotracheal tube lies at the level of the carina. This may be positional in nature. Consideration for withdrawal is recommended. Impella catheter is seen as well as a right thoracostomy catheter. These are stable in appearance from the prior exam. Increasing opacity in the right hemithorax is noted likely related to a posterior fusion. No focal consolidation is seen. Mild left basilar atelectasis is noted. IMPRESSION: Slight increase in right-sided pleural effusion. Tubes and lines as described above. The endotracheal tube lies at the level of the carina and could be withdrawn slightly. Stable left basilar atelectasis. Electronically Signed   By: Inez Catalina M.D.   On: 10/14/2017 07:19   Assessment/Plan:  INTERVAL HISTORY:  - Episode of unresponsiveness and LUE jerking. Neurology consulted. EEG preformed, unclear whether there was epileptic discharge vs background. CT head showing no acute intracranial etiology. Felt to be secondary to hypoperfusion.  - T max of 105F yesterday. Started on hypothermia protocol - Chest tube with significant bloody output. Heparin initially stopped, now restarted. Has received 4 units pRBCs - LDH continues to increase  - Cardiology stopped Lasix to increase CVP needed for Impella   Active Problems:   Atrial fibrillation with RVR (HCC)   Acute on chronic systolic heart failure (HCC)    Encounter for central line care   Shock circulatory (HCC)   Malnutrition of moderate degree   Cardiogenic shock (HCC)   LVAD (left ventricular assist device) present ([place])   HCAP (healthcare-associated pneumonia)   Pressure injury of skin   AKI (acute kidney injury) ([place])   Central line infiltration (HCC)   CHF (congestive heart failure) ([place])   Community acquired pneumonia of right lower lobe of lung ([place])   Hemothorax on right   Hx of chest tube placement   FUO (fever of unknown origin)   Pneumococcal bacteremia   Focal seizure ([place])   Unresponsiveness  Bryan Harmon is a 67 y.o. male  HFrEF and Atriat fibrillation who presented on 1/21 in A-fib with RVR and low output heart failure. He has subsequently has had a complicated hospitalization with placement of an Impella and persistent fevers since 1/30. Unfortunately at his point there is no clear infectious source. We will get a CT chest/abdomen/pelvis today. Agree that the anemia and thrombocytopenia are likely secondary to hemolysis caused by Impella as opposed to Sepsis. Persistent leukocytosis began on 02/01 with last BC on 01/30. Although the patient is on Meropenem agree with repeat BC. Further management of ID as outlined below.   - Agree with repeat blood cultures  - Minimize lines as much as possible  - Do not think his recurrent fevers are drug-fevers as his fevers began prior to the initiation of Vancomycin and Meropenem. Prior to this he was only on Ceftriaxone and this was discontinued. He is now off the Vancomycin and Abdiulafungin. Patient is not on any other drugs that have commonly been associated with drug fevers (other than heparin but this is exceedingly rare).  - Meropenem can decrease the seizure threshold and may account for the patient's episode on unresponsiveness / LUE jerking  - As we continue to work-up infectious causes of the patient's fevers we would recommend getting a CT chest/abdomen/pelvis. If these  come back negative and the patient continues to have fevers could consider an LP (would need to stop anticoagulation) vs MRI to evaluate for cerebritis vs meningeal enhancement (unsure if the Impella in MRI compatible or if the patient is stable enough for MRI).    LOS: 15 days   [facility] 10/15/2017, 8:22 AM

## 2017-10-15 NOTE — Progress Notes (Signed)
Nutrition Follow-up  DOCUMENTATION CODES:   Non-severe (moderate) malnutrition in context of chronic illness  INTERVENTION:   Tube Feeding:  Vital 1.5 @ 60 ml/hr Pro-Stat 30 mL TID Provides 2460 kcals, 143 of protein, 1094 mL of free water  NUTRITION DIAGNOSIS:   Moderate Malnutrition related to chronic illness(CHF) as evidenced by mild fat depletion, severe fat depletion.  Being addressed via TF  GOAL:   Provide needs based on ASPEN/SCCM guidelines  Progressing  MONITOR:   TF tolerance, Weight trends, Vent status, Skin, Labs  REASON FOR ASSESSMENT:   Malnutrition Screening Tool    ASSESSMENT:   67 yo male admitted with acute respiratory failure with RLL pneumonia as well as acute on chronic CHF, AKI. Pt with hx of HTN, CHF  Patient is currently intubated on ventilator support MV: 13.3 L/min Temp (24hrs), Avg:98.9 F (37.2 C), Min:96.4 F (35.8 C), Max:104.2 F (40.1 C)   CT abdomen and chest pending for today Noted new DTI to back and sacrum documented 2/4  Tolerating Vital 1.5 @ 55 ml/hr, Pro-Stat 30 mL TID via Cortrak tube  Labs: reviewed Meds: amiodarone, precedex, milrinone, levophed  Diet Order:  Diet NPO time specified  EDUCATION NEEDS:   Not appropriate for education at this time(Needs education on follow-up)  Skin:  Skin Assessment: Skin Integrity Issues: Skin Integrity Issues:: DTI, Incisions DTI: back, sacrum Incisions: chest  Last BM:  2/4  Height:   Ht Readings from Last 1 Encounters:  10/01/17 5\' 10"  (1.778 m)    Weight:   Wt Readings from Last 1 Encounters:  10/15/17 190 lb 8.7 oz (86.4 kg)    Ideal Body Weight:  75.5 kg  BMI:  Body mass index is 27.34 kg/m.  Estimated Nutritional Needs:   Kcal:  2434 kcals  Protein:  120-160 g  Fluid:  </= 1.8 L  Romelle Starcher MS, RD, LDN, CNSC [phone] Pager  [phone] Weekend/On-Call Pager

## 2017-10-15 NOTE — Progress Notes (Addendum)
Patient ID: Bryan Harmon, male   DOB: 1951/05/26, 67 y.o.   MRN: [id number]     Advanced Heart Failure Rounding Note  PCP:  Primary Cardiologist: Aundra Dubin   Subjective:    Impella 5.0 placed in the OR 1/30.  Chest tube placed on right 1/31.   On amio 30 mg/hr, milrinone 0.375 mcg, norepi 16 mcg.   During the day yesterday, significant bloody output from chest tube with falling hgb. Heparin stopped.  Chest tube output has slowed and he is now back on heparin gtt.   2/4 ID consulted for ongoing fever. Antibiotics narrowed, he is only on meropenem. Neuro consulted for ?seizure. CT of head with no acute findings. EEG without seizure activity.  Received 4 UPRBCs. Remains intubated.   Temperature to 105 yesterday pm, started on hypothermia protocol.   LDH 637>944 Creatinine 2.2>2.5   Impella 5.0  P9 Flow 4.9 L/min  Swan: CVP 6-7 PA 25/17   CI 1.9  Co-ox 61%  Echo: Severe LV dilation, EF 15-20%, severely dilated RV with severely decreased RV systolic function, moderate-severe Bryan, moderate-severe TR, cannot rule out LV apical thrombus.  Repeat echo with Definity showed definite LV apical thrombus.   TEE (1/28): EF 10-15%, diffuse hypokinesis, small LV apical thrombus, mildly dilated RV with moderately decreased systolic function, no LAA thrombus.    Objective:   Weight Range: 190 lb 8.7 oz (86.4 kg) Body mass index is 27.34 kg/m.   Vital Signs:   Temp:  [96.4 F (35.8 C)-104.2 F (40.1 C)] 98.2 F (36.8 C) (02/05 0700) Pulse Rate:  [25-131] 37 (02/05 0700) Resp:  [12-36] 13 (02/05 0700) BP: (63-171)/(42-148) 85/71 (02/05 0700) SpO2:  [54 %-100 %] 90 % (02/05 0700) Arterial Line BP: (71-120)/(50-95) 80/68 (02/05 0700) FiO2 (%):  [40 %] 40 % (02/05 0316) Weight:  [190 lb 8.7 oz (86.4 kg)] 190 lb 8.7 oz (86.4 kg) (02/05 0430) Last BM Date: 10/14/17  Weight change: Filed Weights   10/13/17 0427 10/14/17 0500 10/15/17 0430  Weight: 181 lb 7 oz (82.3 kg) 189 lb 9.5  oz (86 kg) 190 lb 8.7 oz (86.4 kg)    Intake/Output:   Intake/Output Summary (Last 24 hours) at 10/15/2017 0713 Last data filed at 10/15/2017 0700 Gross per 24 hour  Intake 5778.86 ml  Output 3465 ml  Net 2313.86 ml      Physical Exam   CVP 6-7  General:  Intubated/Sedated. On artic sun HEENT: ETT Neck: supple. no JVD. Carotids 2+ bilat; no bruits. No lymphadenopathy or thryomegaly appreciated. Cor: PMI nondisplaced. Regular rate & rhythm. No rubs, gallops or murmurs.R axillary impella 5.0  Lungs: clear R chest tube  Abdomen: soft, nontender, nondistended. No hepatosplenomegaly. No bruits or masses. Good bowel sounds. Extremities: no cyanosis, clubbing, rash, R and LLE 2+ edema Neuro: intubated sedated.  Skin: coccyx/Rbuttock/ mid back deep tissure injury GU: foley yellow urine noted.    Telemetry   A fib 100-120s with NSVT personally reviewed  EKG    No new tracings.    Labs    CBC Recent Labs    10/14/17 0240  10/14/17 1743 10/15/17 0302  WBC 11.6*   < > 13.4* 15.9*  NEUTROABS 9.1*  --   --  PENDING  HGB 6.7*   < > 8.4* 7.8*  HCT 19.7*   < > 25.0* 23.6*  MCV 91.2   < > 88.7 88.1  PLT 88*   < > 81* 75*   < > = values in  this interval not displayed.   Basic Metabolic Panel Recent Labs    10/14/17 0240 10/14/17 1425 10/15/17 0302  NA 139 139 136  K 3.8 4.8 4.1  CL 102 98* 101  CO2 26  --  23  GLUCOSE 132* 146* 179*  BUN 74* 72* 90*  CREATININE 2.22* 2.20* 2.53*  CALCIUM 7.2*  --  7.3*  MG 2.1  --  2.1   Liver Function Tests Recent Labs    10/13/17 0253 10/14/17 0240  AST 65* 63*  ALT 69* 60  ALKPHOS 76 69  BILITOT 1.2 1.2  PROT 4.4* 4.2*  ALBUMIN 1.8* 1.7*   No results for input(s): LIPASE, AMYLASE in the last 72 hours. Cardiac Enzymes No results for input(s): CKTOTAL, CKMB, CKMBINDEX, TROPONINI in the last 72 hours.  BNP: BNP (last 3 results) Recent Labs    12/13/16 0949 02/11/17 1049 10/05/2017 1218  BNP 369.2* 562.7* 2,485.9*     ProBNP (last 3 results) No results for input(s): PROBNP in the last 8760 hours.   D-Dimer No results for input(s): DDIMER in the last 72 hours. Hemoglobin A1C No results for input(s): HGBA1C in the last 72 hours. Fasting Lipid Panel No results for input(s): CHOL, HDL, LDLCALC, TRIG, CHOLHDL, LDLDIRECT in the last 72 hours. Thyroid Function Tests No results for input(s): TSH, T4TOTAL, T3FREE, THYROIDAB in the last 72 hours.  Invalid input(s): FREET3  Other results:   Imaging    Ct Head Wo Contrast  Result Date: 10/14/2017 CLINICAL DATA:  Altered level of consciousness, patient went unresponsive, rolled eyes up, jerking movements of LEFT upper arm EXAM: CT HEAD WITHOUT CONTRAST TECHNIQUE: Contiguous axial images were obtained from the base of the skull through the vertex without intravenous contrast. Sagittal and coronal MPR images reconstructed from axial data set. COMPARISON:  None FINDINGS: Brain: Scattered motion artifacts. Normal ventricular morphology. No midline shift or mass effect. Small vessel chronic ischemic changes of deep cerebral white matter. No definite intracranial hemorrhage, mass lesion, or evidence of acute infarction within limitations of motion. No definite extra-axial fluid collections. Vascular: Atherosclerotic calcifications of internal carotid and vertebral arteries at skull base Skull: No gross abnormality identified Sinuses/Orbits: Air-fluid level in LEFT maxillary sinus Other: N/A IMPRESSION: Atrophy with small vessel chronic ischemic changes of deep cerebral white matter. No definite acute intracranial abnormalities identified on exam limited by motion. LEFT maxillary sinus air-fluid level. Electronically Signed   By: Lavonia Dana M.D.   On: 10/14/2017 16:24   Dg Chest Port 1 View  Result Date: 10/14/2017 CLINICAL DATA:  Difficulty breathing EXAM: PORTABLE CHEST 1 VIEW COMPARISON:  10/14/2017 FINDINGS: Endotracheal tube has been placed in good position.  Swan-Ganz catheter right pulmonary artery. Left subclavian central venous catheter tip in the mid SVC. Impella device in the left ventricle unchanged. Right chest tube in place. Probable small right apical pneumothorax. Moderate right effusion unchanged. Right lower lobe atelectasis/infiltrate unchanged. Left lower lobe airspace disease unchanged. IMPRESSION: Interval intubation with endotracheal tube in good position Probable small right apical pneumothorax Bibasilar airspace disease and bilateral effusions unchanged. Electronically Signed   By: Franchot Gallo M.D.   On: 10/14/2017 15:08     Medications:     Scheduled Medications: . chlorhexidine gluconate (MEDLINE KIT)  15 mL Mouth Rinse BID  . Chlorhexidine Gluconate Cloth  6 each Topical Daily  . feeding supplement (PRO-STAT SUGAR FREE 64)  30 mL Per Tube TID  . Gerhardt's butt cream   Topical BID  . insulin aspart  2-6 Units Subcutaneous Q4H  . insulin glargine  12 Units Subcutaneous BID  . mouth rinse  15 mL Mouth Rinse 10 times per day  . sennosides  5 mL Oral Daily  . sodium chloride flush  3 mL Intravenous Q12H    Infusions: . sodium chloride 20 mL/hr at 10/15/17 0608  . sodium chloride Stopped (10/15/17 0220)  . sodium chloride    . amiodarone 30 mg/hr (10/15/17 0600)  . dexmedetomidine (PRECEDEX) IV infusion 1.2 mcg/kg/hr (10/15/17 0600)  . dextrose 5 % Impella 5.0 Purge solution 4 mL/hr at 10/13/17 1808  . famotidine (PEPCID) IV Stopped (10/14/17 1035)  . feeding supplement (VITAL 1.5 CAL) 1,000 mL (10/15/17 0600)  . impella catheter heparin 50 unit/mL in dextrose 5%    . heparin 1,600 Units/hr (10/15/17 0600)  . levETIRAcetam Stopped (10/15/17 0525)  . meropenem (MERREM) IV Stopped (10/15/17 9563)  . milrinone 0.375 mcg/kg/min (10/15/17 0600)  . norepinephrine (LEVOPHED) Adult infusion 16 mcg/min (10/15/17 0600)    PRN Medications: sodium chloride, acetaminophen (TYLENOL) oral liquid 160 mg/5 mL, albuterol, fentaNYL  (SUBLIMAZE) injection, fentaNYL (SUBLIMAZE) injection, magic mouthwash, midazolam, morphine injection, ondansetron (ZOFRAN) IV, sodium chloride flush, sodium chloride flush    Patient Profile  Bryan Harmon is a 67 year old with a history of PAF S/P DC-CV 11/2016, s/p bilateral inguinal hernia repair 11/2017, PVCs, HTN, NICM, chronic systolic heart failure.   Sent from Urgent Care with A fib RVR. Acutely SOB on arrival   Assessment/Plan   1. Acute hypoxemic respiratory failure: RLL PNA, Strep pneumoniae in blood cultures. Respiratory cultures with parainfluenzae virus. Also acute/chronic systolic CHF.Now intubated s/p Impella 5.0 placement, multifocal PNA on CXR and CT chest. Remains critically ill.  Antibiotics broadened from ceftriaxone to vancomycin/meropenem on 1/30. BAL with Candida albicans, anidulafungin started 2/2.  - Seen by ID, abx narrowed to meropenem alone.  - Right chest tube placed 1/31, still with bloody drainage but slowing today.  2.Acute on chronic systolic CHF-> cardiogenic shock : Nonischemic cardiomyopathy. 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 Etiology of his CHF is not clear =>no definite inciting event. He has a history of HTN but doubt this was the only trigger. 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 This would ideally be confirmed by cMRI, but he has not wanted an MRI (concerned about side effects). He also has frequent PVCs, 21% total on last holter in 4/18 which is a risk for fall in EF.No family history of CMP. Cannot rule out viral myocarditis. SPEP negative. With medical management, he initially felt a lot better. However, he quit all his meds in early 2018 with recurrence of NYHA III symptoms and onset of atrial fibrillation.He had TEE-DCCV in 3/18.TEE showed that EF remained25%. He quit his meds again in 4/18 and apparently did not restart them when I asked him to  in 6/18. No meds probably since 4/18. Echo this admission with EF 15-20%, severe RV dysfunction, LV apical thrombus. RV is hypokinetic.  Attempted DCCV on 1/28 failed likely due to pressors/inotropes.  [place] 1/29 with low cardiac output => Impella 5.0 placed 1/30.  Off diuretics, CVP 6-7. CI 1.9 today with co-ox 61%, this is a bit lower than yesterday.  Still getting good flow from Impella at P9 with 4.9 L/min. - Off Lasix, need higher CVP for Impella.  Will get more blood today.   - Continue current support of RV with milrinone and norepinephrine. - Continue Impella 5.0  at P9. LDH continues to rise LDH trending up 637>944, concerning for hemolysis. I am concerned about lower support with lower cardiac index today.  He is back on heparin gtt today after it was stopped yesterday with hemorrhage from chest tube.  We will again assess Impella under echo for appropriate position.  - End point difficult to envision => would be poor LVAD candidate with renal failure and RV failure.   3. 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 It is possible that the PVCs contribute to his cardiomyopathy.Mag 2.1 K 4.1 Stable.  -Onamiodarone. No change. 4. Atrial fibrillation:Persistent, now with RVR. HR up to 140sinitially, now 110s on amiodarone gtt at30. Not sure how long he has been in atrial fibrillation with marked RVR, but this may have triggered his CHF and AKI due to worsening of cardiac output.  HR now better controlled. Remains in A fib.  -Heparin gtt ongoing. - Continue amiodarone gtt at 30 mg/hr.   - Failed TEE-guided DCCV on 1/28 in setting of vasoactive meds.  If we can wean down milrinone/norepinephrine, will re-attempt DCCV in future.   5. AKI: Creatinine 1.08 in 6/18, he has not been on any meds. Suspect cardiorenal syndrome with afib/RVR and fall in cardiac output.He was initially hyperkalemic and acidotic.Creatinine trending back up. 2.2 -->2.5 .  6. WO:EHOZY pneumo PNA as well  as parainfluenza virus.CT chest 1/29 with multifocal PNA. On  1/30 to vancomycin/meropenem, then anidulafungin added.  Ongoing fevers, up to 105 yesterday and started on hypothermia protoocol.  ID has seen, recommend peeling back antibiotics.  - Plan to stop meropenem after today.  - After weaning of [place] sun, if still having fevers will need CT abdomen/pelvis.  - will make sure blood/urine cultures resent.  7. Elevated LFTs: Suspect shock liver type picture. - AST/ALT > 1000 at admission but have trended down steadily.  8. LV thrombus: Noted on TTE and TEE, small.  - Back on heparin drip.  9. Hyponatremia: Resolved.   10. Malnutrition: tube feeds.    13. Anemia: Significant blood loss via chest tube and mouth.  ?Hemolysis from Impella playing a role => need higher CVP, providing with blood and volume.  Need to check position today.  Received 2UPRBCs 2/3, 2/4 5 uPRBCs.   Todays Hgb 7.8 . Give 2 U PRBCs.  13. Suspected Deep Tissue Injury -->Sacrum/R buttock/Mid back . Continue to repostion R/L. WOC consult appreciated.   14. L hand cold: Watch closely (less differential today), will not remove arterial line yet because alternative location would be femoral and cuff BP inaccurate.  15. Seizure- Neurology appreciated. CT head negative. EEG no seizure noted.    Amy Clegg NP-C  7:13 AM   Patient seen with NP, agree with the above note.  He is remains critically ill and very tenuous.  Fever to 105 yesterday and hypothermia protocol used.  Seen by ID, stopped vancomycin/anidulafungin.  Plan to stop meropenem today (no further growth on multiple cultures).  ?Drug fever.  CI 1.9 today with co-ox 61% on full Impella support. CVP down to 6-7 today, ideal around 10 with Impella.  LDH up to 944 and hgb 7.8.  Creatinine higher at 2.5.  Still with right chest tube with bloody drainage => lots of blood out yesterday and heparin gtt stopped.  Today, much less blood and back on heparin gtt.    On exam,  intubated but per family intermittently able to follow some commands.  Has RIJ Swan and left  subclavian central line.  JVP not elevated.  2+ edema to at ankles.  Extremities cool.  Decreased breath sounds dependently.  Sacral decubitus.   See plan outlined in above note.  I am concerned about ongoing hemolysis in addition to blood loss via chest tube.  The chest tube losses are slowing but LDH is higher.  He was off heparin gtt for a time yesterday with bleeding via chest tube and CVP has been low.  - Will give 2 units PRBCs now for blood and volume. No Lasix.  - Will check position of Impella today via echo.  - Do not think we can wean Impella speed today with marginal cardiac output.  - Continue heparin gtt.   Continue current milrinone and norepinephrine.  Atrial fibrillation rate controlled on amiodarone.    Ongoing fevers to 105 yesterday during the day requiring [employer] despite coverage with vanocmycin, meropenem, and anidulafungin.  ID has seen.  ?Drug fever, ?anything we are missing.  Source assumed to be RLL PNA.  - He is now off vancomycin/anidulafungin, plan to stop meropenem today.  - May need repeat CT abdomen/pelvis.   Unable to wean current support.  Developing decubitus ulcer.  At this time, long-term prognosis is poor.  He is not a candidate for LVAD at this point.  Have discussed poor prognosis with family, they want to continue aggressive treatment for the time being.   CRITICAL CARE Performed by: Loralie Champagne  Total critical care time: 45 minutes  Critical care time was exclusive of separately billable procedures and treating other patients.  Critical care was necessary to treat or prevent imminent or life-threatening deterioration.  Critical care was time spent personally by me on the following activities: development of treatment plan with patient and/or surrogate as well as nursing, discussions with consultants, evaluation of patient's response to  treatment, examination of patient, obtaining history from patient or surrogate, ordering and performing treatments and interventions, ordering and review of laboratory studies, ordering and review of radiographic studies, pulse oximetry and re-evaluation of patient's condition.  Loralie Champagne 10/15/2017 8:03 AM   Impella assess under echo, pulled back about 1/2 cm and left at 3.5 cm.  Improvement in waveform.  Impella appears to be very positional.  I turned down to P8 with flow 4.0-4.1 L/min.  Sending UA today to look for e/o hemolysis.   Echo done for Impella positioning shows a very small vegetation attached to the mitral valve chord (not valve itself or Impella).  I have not seen this on prior positioning echoes or on the prior TEE, but this positioning echo was more thorough than the last couple.  He remains on meropenem, will discuss with ID.   Loralie Champagne 10/15/2017 9:42 AM

## 2017-10-15 NOTE — Progress Notes (Signed)
ANTICOAGULATION CONSULT NOTE   Pharmacy Consult for heparin while on Impella 5.0 Indication: Impella 5.0 in setting of LV thrombus and atrial fibrillation  No Known Allergies  Patient Measurements: Height: 5\' 10"  (177.8 cm) Weight: 190 lb 8.7 oz (86.4 kg) IBW/kg (Calculated) : 73 Heparin Dosing Weight: 84.1 kg  Vital Signs: Temp: 100.2 F (37.9 C) (02/05 2130) Temp Source: Core (02/05 1100) BP: 87/49 (02/05 2130) Pulse Rate: 128 (02/05 2130)  Labs: Recent Labs    10/13/17 0253  10/14/17 0239  10/14/17 1400  10/14/17 1743 10/15/17 0302 10/15/17 1330 10/15/17 1512  HGB 6.8*   < >  --    < > 8.1*   < > 8.4* 7.8* 8.6* 8.2*  HCT 20.0*   < >  --    < > 24.0*   < > 25.0* 23.6* 25.2* 24.0*  PLT  --    < >  --    < > 94*  --  81* 75*  --   --   APTT 121*  --  115*  --   --   --   --  105*  --   --   HEPARINUNFRC 0.42  --  0.57  --   --   --   --  0.33  --   --   CREATININE 2.38*   < >  --    < >  --    < >  --  2.53* 2.47* 2.50*   < > = values in this interval not displayed.    Estimated Creatinine Clearance: 30.4 mL/min (A) (by C-G formula based on SCr of 2.5 mg/dL (H)).  Assessment: 66yom with LV apical thrombus previously on heparin drip. ECHO showing again small LV apical thrombus but no LA appendage thrombus. He is also in atrial fibrillation.   Impella 5.0 was placed 1/31. Heparin 50 units/ml in purge solution was started 1/31 PM and then systemic heparin started for ACT goal 160-180 sec.   After increased CT output yesterday, per Dr. [name], will target goal ACT~160 instead of previous 160-180 sec. Peripheral heparin had been held yesterday afternoon for neurology workup. It was restarted at 2154 on 2/4 after EEG and CT head were negative.  Purge flow rate 3.1 ml/hr heparin = 155 units/hr. Peripheral heparin infusion is running at 1600 units/hr. ACT ~150s. Heparin was held shortly this morning to allow for femoral A-line placement. CT output this morning is 220  mL.  Hgb is low at 7.8. Received 4 units PRBCs yesterday and 2 planned so far today. Platelets are stable ~150. LDH continues to increase from [phone] - will continue to monitor.  Plan: 1) Spoke to RN this evening about ACT now in 140s.  Will increase heparin to 1700 units/hr per Impella heparin orders.  Monitor chest tube output for any increased drainage.  Tad Moore, BCPS  Clinical Pharmacist Pager [phone]  10/15/2017 9:50 PM

## 2017-10-15 NOTE — Progress Notes (Signed)
ANTICOAGULATION CONSULT NOTE   Pharmacy Consult for heparin while on Impella 5.0 Indication: Impella 5.0 in setting of LV thrombus and atrial fibrillation  No Known Allergies  Patient Measurements: Height: 5\' 10"  (177.8 cm) Weight: 190 lb 8.7 oz (86.4 kg) IBW/kg (Calculated) : 73 Heparin Dosing Weight: 84.1 kg  Vital Signs: Temp: 98.2 F (36.8 C) (02/05 1300) Temp Source: Core (02/05 1100) BP: 102/82 (02/05 1200) Pulse Rate: 126 (02/05 1300)  Labs: Recent Labs    10/13/17 0253  10/14/17 0239 10/14/17 0240 10/14/17 1400 10/14/17 1425 10/14/17 1743 10/15/17 0302  HGB 6.8*   < >  --  6.7* 8.1* 7.8* 8.4* 7.8*  HCT 20.0*   < >  --  19.7* 24.0* 23.0* 25.0* 23.6*  PLT  --    < >  --  88* 94*  --  81* 75*  APTT 121*  --  115*  --   --   --   --  105*  HEPARINUNFRC 0.42  --  0.57  --   --   --   --  0.33  CREATININE 2.38*   < >  --  2.22*  --  2.20*  --  2.53*   < > = values in this interval not displayed.    Estimated Creatinine Clearance: 30.1 mL/min (A) (by C-G formula based on SCr of 2.53 mg/dL (H)).  Assessment: 66yom with LV apical thrombus previously on heparin drip. ECHO showing again small LV apical thrombus but no LA appendage thrombus. He is also in atrial fibrillation.   Impella 5.0 was placed 1/31. Heparin 50 units/ml in purge solution was started 1/31 PM and then systemic heparin started for ACT goal 160-180 sec.   After increased CT output yesterday, per Dr. [name], will target goal ACT~160 instead of previous 160-180 sec. Peripheral heparin had been held yesterday afternoon for neurology workup. It was restarted at 2154 on 2/4 after EEG and CT head were negative.  Purge flow rate 3.1 ml/hr heparin = 155 units/hr. Peripheral heparin infusion is running at 1600 units/hr. ACT ~150s. Heparin was held shortly this morning to allow for femoral A-line placement. CT output this morning is 220 mL.  Hgb is low at 7.8. Received 4 units PRBCs yesterday and 2 planned so  far today. Platelets are stable ~150. LDH continues to increase from [phone] - will continue to monitor.  Plan: 1) Continue to monitor ACTs along with nursing to provide any assistance.  Girard Cooter, PharmD Clinical Pharmacist  Pager: [phone] Clinical Phone for 10/15/2017 until 3:30pm: [phone] If after 3:30pm, please call main pharmacy at [phone]  10/15/2017 1:11 PM

## 2017-10-16 ENCOUNTER — Inpatient Hospital Stay (HOSPITAL_COMMUNITY): Payer: Medicare Other

## 2017-10-16 DIAGNOSIS — I5020 Unspecified systolic (congestive) heart failure: Secondary | ICD-10-CM

## 2017-10-16 DIAGNOSIS — J90 Pleural effusion, not elsewhere classified: Secondary | ICD-10-CM

## 2017-10-16 DIAGNOSIS — J869 Pyothorax without fistula: Secondary | ICD-10-CM

## 2017-10-16 DIAGNOSIS — I509 Heart failure, unspecified: Secondary | ICD-10-CM

## 2017-10-16 DIAGNOSIS — I5023 Acute on chronic systolic (congestive) heart failure: Secondary | ICD-10-CM

## 2017-10-16 DIAGNOSIS — Z515 Encounter for palliative care: Secondary | ICD-10-CM

## 2017-10-16 DIAGNOSIS — I330 Acute and subacute infective endocarditis: Secondary | ICD-10-CM

## 2017-10-16 DIAGNOSIS — J850 Gangrene and necrosis of lung: Secondary | ICD-10-CM

## 2017-10-16 DIAGNOSIS — Z7189 Other specified counseling: Secondary | ICD-10-CM

## 2017-10-16 LAB — ECHOCARDIOGRAM LIMITED
HEIGHTINCHES: 70 in
Height: 70 in
LV SIMPSON'S DISK: 11
LV dias vol index: 123 mL/m2
LV dias vol: 263 mL — AB (ref 62–150)
LV sys vol index: 110 mL/m2
LV sys vol: 235 mL — AB (ref 21–61)
Stroke v: 28 ml
Weight: 3200.37 oz
Weight: 3200.37 oz

## 2017-10-16 LAB — TYPE AND SCREEN
ABO/RH(D): O POS
ANTIBODY SCREEN: NEGATIVE
UNIT DIVISION: 0
UNIT DIVISION: 0
UNIT DIVISION: 0
UNIT DIVISION: 0
Unit division: 0
Unit division: 0
Unit division: 0
Unit division: 0
Unit division: 0
Unit division: 0

## 2017-10-16 LAB — POCT I-STAT 3, ART BLOOD GAS (G3+)
ACID-BASE DEFICIT: 1 mmol/L (ref 0.0–2.0)
Acid-Base Excess: 1 mmol/L (ref 0.0–2.0)
Bicarbonate: 24.4 mmol/L (ref 20.0–28.0)
Bicarbonate: 24.6 mmol/L (ref 20.0–28.0)
O2 SAT: 94 %
O2 SAT: 96 %
PCO2 ART: 35.9 mmHg (ref 32.0–48.0)
PH ART: 7.373 (ref 7.350–7.450)
TCO2: 26 mmol/L (ref 22–32)
TCO2: 26 mmol/L (ref 22–32)
pCO2 arterial: 41.8 mmHg (ref 32.0–48.0)
pH, Arterial: 7.446 (ref 7.350–7.450)
pO2, Arterial: 73 mmHg — ABNORMAL LOW (ref 83.0–108.0)
pO2, Arterial: 80 mmHg — ABNORMAL LOW (ref 83.0–108.0)

## 2017-10-16 LAB — POCT ACTIVATED CLOTTING TIME
Activated Clotting Time: 142 seconds
Activated Clotting Time: 147 seconds
Activated Clotting Time: 142 seconds

## 2017-10-16 LAB — BPAM RBC
BLOOD PRODUCT EXPIRATION DATE: 201902212359
BLOOD PRODUCT EXPIRATION DATE: 201902212359
BLOOD PRODUCT EXPIRATION DATE: 201903012359
BLOOD PRODUCT EXPIRATION DATE: 201903012359
BLOOD PRODUCT EXPIRATION DATE: 201903042359
Blood Product Expiration Date: 201902122359
Blood Product Expiration Date: 201902152359
Blood Product Expiration Date: 201902212359
Blood Product Expiration Date: 201902252359
Blood Product Expiration Date: 201903052359
ISSUE DATE / TIME: 201902021344
ISSUE DATE / TIME: 201902030848
ISSUE DATE / TIME: 201902040528
ISSUE DATE / TIME: 201902041453
ISSUE DATE / TIME: 201902050816
ISSUE DATE / TIME: 201902031108
ISSUE DATE / TIME: 201902040805
ISSUE DATE / TIME: 201902041001
ISSUE DATE / TIME: 201902042219
ISSUE DATE / TIME: 201902051011
UNIT TYPE AND RH: 5100
UNIT TYPE AND RH: 5100
UNIT TYPE AND RH: 5100
UNIT TYPE AND RH: 5100
UNIT TYPE AND RH: 5100
Unit Type and Rh: 5100
Unit Type and Rh: 5100
Unit Type and Rh: 5100
Unit Type and Rh: 5100
Unit Type and Rh: 5100

## 2017-10-16 LAB — BLOOD GAS, ARTERIAL
Acid-Base Excess: 2.6 mmol/L — ABNORMAL HIGH (ref 0.0–2.0)
Bicarbonate: 25.8 mmol/L (ref 20.0–28.0)
FIO2: 40
O2 Saturation: 97.2 %
PEEP: 5 cmH2O
Patient temperature: 98.6
Pressure control: 10 cmH2O
RATE: 15 resp/min
pCO2 arterial: 34.2 mmHg (ref 32.0–48.0)
pH, Arterial: 7.49 — ABNORMAL HIGH (ref 7.350–7.450)
pO2, Arterial: 86.3 mmHg (ref 83.0–108.0)

## 2017-10-16 LAB — GLUCOSE, CAPILLARY
GLUCOSE-CAPILLARY: 122 mg/dL — AB (ref 65–99)
GLUCOSE-CAPILLARY: 137 mg/dL — AB (ref 65–99)
GLUCOSE-CAPILLARY: 171 mg/dL — AB (ref 65–99)
Glucose-Capillary: 135 mg/dL — ABNORMAL HIGH (ref 65–99)
Glucose-Capillary: 150 mg/dL — ABNORMAL HIGH (ref 65–99)
Glucose-Capillary: 131 mg/dL — ABNORMAL HIGH (ref 65–99)

## 2017-10-16 LAB — URINE CULTURE
CULTURE: NO GROWTH
Special Requests: NORMAL

## 2017-10-16 LAB — BASIC METABOLIC PANEL
Anion gap: 12 (ref 5–15)
BUN: 96 mg/dL — AB (ref 6–20)
CO2: 23 mmol/L (ref 22–32)
CREATININE: 2.66 mg/dL — AB (ref 0.61–1.24)
Calcium: 7.3 mg/dL — ABNORMAL LOW (ref 8.9–10.3)
Chloride: 102 mmol/L (ref 101–111)
GFR calc Af Amer: 27 mL/min — ABNORMAL LOW (ref >60)
GFR, EST NON AFRICAN AMERICAN: 24 mL/min — AB (ref >60)
Glucose, Bld: 127 mg/dL — ABNORMAL HIGH (ref 65–99)
Potassium: 4.2 mmol/L (ref 3.5–5.1)
SODIUM: 137 mmol/L (ref 135–145)

## 2017-10-16 LAB — CBC WITH DIFFERENTIAL/PLATELET
BAND NEUTROPHILS: 3 %
BASOS PCT: 0 %
Basophils Absolute: 0 10*3/uL (ref 0.0–0.1)
Blasts: 0 %
EOS ABS: 0.4 10*3/uL (ref 0.0–0.7)
Eosinophils Relative: 2 %
HCT: 23 % — ABNORMAL LOW (ref 39.0–52.0)
Hemoglobin: 7.8 g/dL — ABNORMAL LOW (ref 13.0–17.0)
LYMPHS ABS: 1.6 10*3/uL (ref 0.7–4.0)
Lymphocytes Relative: 8 %
MCH: 30.4 pg (ref 26.0–34.0)
MCHC: 33.9 g/dL (ref 30.0–36.0)
MCV: 89.5 fL (ref 78.0–100.0)
MONO ABS: 0.8 10*3/uL (ref 0.1–1.0)
MONOS PCT: 4 %
Metamyelocytes Relative: 0 %
Myelocytes: 0 %
NEUTROS ABS: 16.6 10*3/uL — AB (ref 1.7–7.7)
Neutrophils Relative %: 83 %
OTHER: 0 %
PLATELETS: 102 10*3/uL — AB (ref 150–400)
Promyelocytes Absolute: 0 %
RBC: 2.57 MIL/uL — ABNORMAL LOW (ref 4.22–5.81)
RDW: 15.9 % — ABNORMAL HIGH (ref 11.5–15.5)
WBC: 19.4 10*3/uL — ABNORMAL HIGH (ref 4.0–10.5)
nRBC: 11 /100 WBC — ABNORMAL HIGH

## 2017-10-16 LAB — POCT I-STAT, CHEM 8
BUN: 89 mg/dL — AB (ref 6–20)
CALCIUM ION: 1.08 mmol/L — AB (ref 1.15–1.40)
CHLORIDE: 100 mmol/L — AB (ref 101–111)
Creatinine, Ser: 2.8 mg/dL — ABNORMAL HIGH (ref 0.61–1.24)
GLUCOSE: 146 mg/dL — AB (ref 65–99)
HCT: 25 % — ABNORMAL LOW (ref 39.0–52.0)
Hemoglobin: 8.5 g/dL — ABNORMAL LOW (ref 13.0–17.0)
Potassium: 4.1 mmol/L (ref 3.5–5.1)
Sodium: 137 mmol/L (ref 135–145)
TCO2: 24 mmol/L (ref 22–32)

## 2017-10-16 LAB — COOXEMETRY PANEL
Carboxyhemoglobin: 1.5 % (ref 0.5–1.5)
Carboxyhemoglobin: 1.8 % — ABNORMAL HIGH (ref 0.5–1.5)
Carboxyhemoglobin: 1.8 % — ABNORMAL HIGH (ref 0.5–1.5)
METHEMOGLOBIN: 0.9 % (ref 0.0–1.5)
Methemoglobin: 1.4 % (ref 0.0–1.5)
Methemoglobin: 1 % (ref 0.0–1.5)
O2 Saturation: 43 %
O2 Saturation: 45.5 %
O2 Saturation: 51.3 %
Total hemoglobin: 8.2 g/dL — ABNORMAL LOW (ref 12.0–16.0)
Total hemoglobin: 7.8 g/dL — ABNORMAL LOW (ref 12.0–16.0)
Total hemoglobin: 7.9 g/dL — ABNORMAL LOW (ref 12.0–16.0)

## 2017-10-16 LAB — CALCIUM, IONIZED: Calcium, Ionized, Serum: 4.5 mg/dL (ref 4.5–5.6)

## 2017-10-16 LAB — PHOSPHORUS: PHOSPHORUS: 4.4 mg/dL (ref 2.5–4.6)

## 2017-10-16 LAB — HEPATITIS B SURFACE ANTIGEN: HEP B S AG: NEGATIVE

## 2017-10-16 LAB — APTT: aPTT: 82 seconds — ABNORMAL HIGH (ref 24–36)

## 2017-10-16 LAB — HAPTOGLOBIN: Haptoglobin: <10 mg/dL — ABNORMAL LOW (ref 34–200)

## 2017-10-16 LAB — HEPARIN LEVEL (UNFRACTIONATED)
Heparin Unfractionated: 0.23 IU/mL — ABNORMAL LOW (ref 0.30–0.70)
Heparin Unfractionated: 0.16 IU/mL — ABNORMAL LOW (ref 0.30–0.70)

## 2017-10-16 LAB — HCV COMMENT:

## 2017-10-16 LAB — LACTATE DEHYDROGENASE: LDH: 929 U/L — ABNORMAL HIGH (ref 98–192)

## 2017-10-16 LAB — HEPATITIS C ANTIBODY (REFLEX)

## 2017-10-16 LAB — HEMOGLOBIN FREE, PLASMA: Hgb, Plasma: 15.4 mg/dL — ABNORMAL HIGH (ref 0.0–4.9)

## 2017-10-16 LAB — MAGNESIUM: Magnesium: 2.1 mg/dL (ref 1.7–2.4)

## 2017-10-16 LAB — PREPARE RBC (CROSSMATCH)

## 2017-10-16 MED ORDER — MIDAZOLAM HCL 2 MG/2ML IJ SOLN
2.0000 mg | Freq: Once | INTRAMUSCULAR | Status: AC
  Administered 2017-10-16: 2 mg via INTRAVENOUS

## 2017-10-16 MED ORDER — SODIUM CHLORIDE 0.9 % IV SOLN: Freq: Once | INTRAVENOUS | Status: DC

## 2017-10-16 MED ORDER — HEPARIN (PORCINE) IN NACL 100-0.45 UNIT/ML-% IJ SOLN
1700.0000 [IU]/h | INTRAMUSCULAR | Status: DC
Start: 2017-10-16 — End: 2017-10-17

## 2017-10-16 MED ORDER — LIDOCAINE HCL (PF) 1 % IJ SOLN
INTRAMUSCULAR | Status: AC
  Administered 2017-10-16: 30 mL
  Filled 2017-10-16: qty 30

## 2017-10-16 MED ORDER — LIDOCAINE HCL (PF) 1 % IJ SOLN
INTRAMUSCULAR | Status: AC
  Filled 2017-10-16: qty 10

## 2017-10-16 MED ORDER — FENTANYL CITRATE (PF) 100 MCG/2ML IJ SOLN
100.0000 ug | Freq: Once | INTRAMUSCULAR | Status: AC
  Administered 2017-10-16: 100 ug via INTRAVENOUS

## 2017-10-16 NOTE — Progress Notes (Signed)
Patient ID: Bryan Harmon, male   DOB: [birth year], 67 y.o.   MRN: [id number]  Called to bedside for Impella pump stoppage.  Pump stopped without warning.  Echo was done this morning prior to pump stop, showed stable small vegetation on a chord of the mitral valve.  Echo done post-pump stop showed no vegetation visible.  I suspect that this small vegetation was sucked into the Impella leading the pump to stop.    SBP 100s with CI 2.4 from [place] after pump stopped.  Hemodynamically stable so far.  He remains on milrinone 0.5 and norepinephrine 18.    Impella will need to be removed.  We have contacted Dr. Donata Clay.  Ideally, this would be done at the bedside without general anesthesia (discussed with Impella rep, should be an option here).    I would not replace the Impella given current hemodynamic stability and patient's overall picture.   CRITICAL CARE Performed by: Marca Ancona  Total critical care time: 30 minutes  Critical care time was exclusive of separately billable procedures and treating other patients.  Critical care was necessary to treat or prevent imminent or life-threatening deterioration.  Critical care was time spent personally by me on the following activities: development of treatment plan with patient and/or surrogate as well as nursing, discussions with consultants, evaluation of patient's response to treatment, examination of patient, obtaining history from patient or surrogate, ordering and performing treatments and interventions, ordering and review of laboratory studies, ordering and review of radiographic studies, pulse oximetry and re-evaluation of patient's condition.  Marca Ancona 10/16/2017 11:48 AM

## 2017-10-16 NOTE — Progress Notes (Addendum)
Patient ID: Bryan Harmon, male   DOB: [birth year], 66 y.o.   MRN: [id number]     Advanced Heart Failure Rounding Note  PCP:  Primary Cardiologist: Aundra Dubin   Subjective:    Impella 5.0 placed in the OR 1/30.  Chest tube placed on right 1/31.   2/4 ID consulted for ongoing fever. 2/4 Neuro consulted for ?seizure. CT of head with no acute findings. EEG without seizure activity.  Echo for Impella placement on 2/5 showed a small vegetation on a mitral valve chord.   2/5 CT of chest/abd/pelvis: Worsening pulmonary infiltrates.  Necrotizing PNA on right with complex pleural effusion. Also with aggressive left base PNA.    He had temperature to 100.4 last night, remains on [place] Sun.  Antibiotics changed to ceftaroline + Flagyl.   Awake, follows commands.   Still with bloody drainage from right chest tube, hgb 7.8 today.   On amio 30 mg/hr, milrinone 0.375 mcg, norepi 18 mcg  LDH [id number] Creatinine 2.2>2.5 >2.6  Impella 5.0  P 8  Flow 4.4 L/min No alarms  Swan: CVP 5-6  PA 24/18  CI 2.3  Co-ox 45.5%  Echo: Severe LV dilation, EF 15-20%, severely dilated RV with severely decreased RV systolic function, moderate-severe MR, moderate-severe TR, cannot rule out LV apical thrombus.  Repeat echo with Definity showed definite LV apical thrombus.   TEE (1/28): EF 10-15%, diffuse hypokinesis, small LV apical thrombus, mildly dilated RV with moderately decreased systolic function, no LAA thrombus.    Objective:   Weight Range: 200 lb 0.4 oz (90.7 kg) Body mass index is 28.7 kg/m.   Vital Signs:   Temp:  [96.8 F (36 C)-100.4 F (38 C)] 99.1 F (37.3 C) (02/06 0700) Pulse Rate:  [77-133] 123 (02/06 0700) Resp:  [12-28] 18 (02/06 0700) BP: (74-124)/(30-99) 94/69 (02/06 0700) SpO2:  [90 %-100 %] 98 % (02/06 0700) Arterial Line BP: (65-107)/(50-82) 92/64 (02/06 0700) FiO2 (%):  [40 %] 40 % (02/06 0309) Weight:  [200 lb 0.4 oz (90.7 kg)] 200 lb 0.4 oz (90.7 kg) (02/06  0300) Last BM Date: 10/14/17(Per documentation)  Weight change: Filed Weights   10/14/17 0500 10/15/17 0430 10/16/17 0300  Weight: 189 lb 9.5 oz (86 kg) 190 lb 8.7 oz (86.4 kg) 200 lb 0.4 oz (90.7 kg)    Intake/Output:   Intake/Output Summary (Last 24 hours) at 10/16/2017 0714 Last data filed at 10/16/2017 0700 Gross per 24 hour  Intake 6445.48 ml  Output 2275 ml  Net 4170.48 ml      Physical Exam  CVP 5-6  General: Intubated. On arctic sun.  HEENT: ETT Neck: supple. no JVD. Carotids 2+ bilat; no bruits. No lymphadenopathy or thryomegaly appreciated. Cor: PMI nondisplaced. Irregular rate & rhythm. No rubs, gallops or murmurs. R axillar impella 5.0 .  Lungs:  R chest tube Abdomen: soft, nontender, nondistended. No hepatosplenomegaly. No bruits or masses. Good bowel sounds. Extremities: no cyanosis, clubbing, rash, 3+ edema. Extremities cool. Left femoral A line.  Neuro: intubated, can follow commands.   Skin: coccyx/Rbuttock/ mid back deep tissure injury GU: foley yellow urine noted.    Telemetry  A Fib 110s personally reviewed.   EKG    No new tracings.    Labs    CBC Recent Labs    10/15/17 0302  10/15/17 1512 10/16/17 0404  WBC 15.9*  --   --  PENDING  NEUTROABS 13.2*  --   --  PENDING  HGB 7.8*   < > 8.2*  7.8*  HCT 23.6*   < > 24.0* 23.0*  MCV 88.1  --   --  89.5  PLT 75*  --   --  102*   < > = values in this interval not displayed.   Basic Metabolic Panel Recent Labs    10/15/17 0302 10/15/17 1330 10/15/17 1512 10/16/17 0404  NA 136 137 136 137  K 4.1 4.3 4.2 4.2  CL 101 102 99* 102  CO2 23 24  --  23  GLUCOSE 179* 132* 109* 127*  BUN 90* 92* 85* 96*  CREATININE 2.53* 2.47* 2.50* 2.66*  CALCIUM 7.3* 7.2*  --  7.3*  MG 2.1  --   --  2.1  PHOS  --   --   --  4.4   Liver Function Tests Recent Labs    10/14/17 0240 10/15/17 1029  AST 63* 68*  ALT 60 44  ALKPHOS 69 75  BILITOT 1.2 1.8*  PROT 4.2* 3.6*  ALBUMIN 1.7* 1.6*   No results  for input(s): LIPASE, AMYLASE in the last 72 hours. Cardiac Enzymes No results for input(s): CKTOTAL, CKMB, CKMBINDEX, TROPONINI in the last 72 hours.  BNP: BNP (last 3 results) Recent Labs    12/13/16 0949 02/11/17 1049 09/23/2017 1218  BNP 369.2* 562.7* 2,485.9*    ProBNP (last 3 results) No results for input(s): PROBNP in the last 8760 hours.   D-Dimer No results for input(s): DDIMER in the last 72 hours. Hemoglobin A1C No results for input(s): HGBA1C in the last 72 hours. Fasting Lipid Panel No results for input(s): CHOL, HDL, LDLCALC, TRIG, CHOLHDL, LDLDIRECT in the last 72 hours. Thyroid Function Tests No results for input(s): TSH, T4TOTAL, T3FREE, THYROIDAB in the last 72 hours.  Invalid input(s): FREET3  Other results:   Imaging    Ct Abdomen Pelvis Wo Contrast  Result Date: 10/15/2017 CLINICAL DATA:  Fever of unknown origin. EXAM: CT CHEST, ABDOMEN AND PELVIS WITHOUT CONTRAST TECHNIQUE: Multidetector CT imaging of the chest, abdomen and pelvis was performed following the standard protocol without IV contrast. COMPARISON:  Chest CT 09/12/2017 FINDINGS: CT CHEST FINDINGS Cardiovascular: The heart is globally enlarged and stable. Stable position of the Impella device. No pericardial effusion. Mediastinum/Nodes: Numerous scattered mediastinal and hilar lymph nodes. The esophagus contains a feeding tube. Lungs/Pleura: Complex right pleural fluid collection worrisome for empyema or hemothorax despite the chest tube. Complex overlying infiltrate worrisome for necrotizing pneumonia. Small left effusion and overlying infiltrate with low-attenuation also worrisome for aggressive pneumonia. Progressive finding since the prior CT scan. Musculoskeletal: No significant bony findings. CT ABDOMEN PELVIS FINDINGS Hepatobiliary: No focal hepatic lesions. The gallbladder is distended and contains layering gallstones. Pancreas: No mass, inflammation or ductal dilatation. Spleen: Grossly  normal. Adrenals/Urinary Tract: Grossly normal. Stomach/Bowel: The stomach contains a feeding tube. The duodenum, small bowel and colon are grossly normal. No obstruction or mass. Vascular/Lymphatic: Stable vascular calcifications. Scattered mesenteric and retroperitoneal lymph nodes but no obvious mass or overt adenopathy. Reproductive: Foley catheter in the bladder. Prostate gland and seminal vesicles are grossly normal. Other: Mesenteric edema and small volume abdominal/pelvic ascites. There is also diffuse body wall edema suggesting anasarca. Musculoskeletal: No significant findings. IMPRESSION: 1. Worsening bilateral pulmonary infiltrates with areas of low attenuation worrisome for necrotizing pneumonia. 2. Large right and small left pleural effusions. The right effusion is larger and is complicated by high attenuation material worrisome for empyema or pleural hemorrhage despite the chest tube. 3. Stable cardiac enlargement and stable support apparatus. 4. Diffuse  mesenteric edema and small volume abdominal/pelvic ascites. There is also diffuse body wall edema suggesting anasarca. 5. Distended gallbladder with layering gallstones. No obvious acute cholecystitis. This is likely cholestasis. Right upper quadrant ultrasound may be helpful for further evaluation. Electronically Signed   By: Marijo Sanes M.D.   On: 10/15/2017 16:10   Ct Chest Wo Contrast  Result Date: 10/15/2017 CLINICAL DATA:  Fever of unknown origin. EXAM: CT CHEST, ABDOMEN AND PELVIS WITHOUT CONTRAST TECHNIQUE: Multidetector CT imaging of the chest, abdomen and pelvis was performed following the standard protocol without IV contrast. COMPARISON:  Chest CT 10/01/2017 FINDINGS: CT CHEST FINDINGS Cardiovascular: The heart is globally enlarged and stable. Stable position of the Impella device. No pericardial effusion. Mediastinum/Nodes: Numerous scattered mediastinal and hilar lymph nodes. The esophagus contains a feeding tube. Lungs/Pleura:  Complex right pleural fluid collection worrisome for empyema or hemothorax despite the chest tube. Complex overlying infiltrate worrisome for necrotizing pneumonia. Small left effusion and overlying infiltrate with low-attenuation also worrisome for aggressive pneumonia. Progressive finding since the prior CT scan. Musculoskeletal: No significant bony findings. CT ABDOMEN PELVIS FINDINGS Hepatobiliary: No focal hepatic lesions. The gallbladder is distended and contains layering gallstones. Pancreas: No mass, inflammation or ductal dilatation. Spleen: Grossly normal. Adrenals/Urinary Tract: Grossly normal. Stomach/Bowel: The stomach contains a feeding tube. The duodenum, small bowel and colon are grossly normal. No obstruction or mass. Vascular/Lymphatic: Stable vascular calcifications. Scattered mesenteric and retroperitoneal lymph nodes but no obvious mass or overt adenopathy. Reproductive: Foley catheter in the bladder. Prostate gland and seminal vesicles are grossly normal. Other: Mesenteric edema and small volume abdominal/pelvic ascites. There is also diffuse body wall edema suggesting anasarca. Musculoskeletal: No significant findings. IMPRESSION: 1. Worsening bilateral pulmonary infiltrates with areas of low attenuation worrisome for necrotizing pneumonia. 2. Large right and small left pleural effusions. The right effusion is larger and is complicated by high attenuation material worrisome for empyema or pleural hemorrhage despite the chest tube. 3. Stable cardiac enlargement and stable support apparatus. 4. Diffuse mesenteric edema and small volume abdominal/pelvic ascites. There is also diffuse body wall edema suggesting anasarca. 5. Distended gallbladder with layering gallstones. No obvious acute cholecystitis. This is likely cholestasis. Right upper quadrant ultrasound may be helpful for further evaluation. Electronically Signed   By: Marijo Sanes M.D.   On: 10/15/2017 16:10   Dg Chest Port 1  View  Result Date: 10/15/2017 CLINICAL DATA:  Hypertension. Respiratory failure. Left ventricular assist device. EXAM: PORTABLE CHEST 1 VIEW COMPARISON:  Yesterday FINDINGS: Endotracheal tube terminates 2.5 cm above carina.Swan-Ganz catheter from a right internal jugular approach is unchanged with tip in the proximal right pulmonary artery. A left-sided subclavian line terminates over the high SVC. Feeding tube extends beyond the inferior aspect of the film. Similar position of Impella device terminating over the left ventricle. Right chest tube is unchanged in position. Cardiomegaly accentuated by AP portable technique. Layering right pleural effusion and probable small left pleural effusion, similar. Decrease in less than 5% right apical pneumothorax. Similar bibasilar airspace disease. Mild pulmonary venous congestion is asymmetric, greater on the right. IMPRESSION: Right chest tube remaining in place, with decrease in less than 5% right apical pneumothorax. Otherwise, similar appearance of the chest with interstitial edema, layering pleural effusions, and bibasilar airspace disease. Electronically Signed   By: Abigail Miyamoto M.D.   On: 10/15/2017 07:43     Medications:     Scheduled Medications: . chlorhexidine gluconate (MEDLINE KIT)  15 mL Mouth Rinse BID  . Chlorhexidine Gluconate  Cloth  6 each Topical Daily  . feeding supplement (PRO-STAT SUGAR FREE 64)  30 mL Per Tube TID  . Gerhardt's butt cream   Topical BID  . insulin aspart  2-6 Units Subcutaneous Q4H  . insulin glargine  12 Units Subcutaneous BID  . mouth rinse  15 mL Mouth Rinse 10 times per day  . sennosides  5 mL Oral Daily  . sodium chloride flush  3 mL Intravenous Q12H    Infusions: . sodium chloride 20 mL/hr at 10/15/17 0608  . sodium chloride Stopped (10/15/17 0220)  . sodium chloride 20 mL/hr at 10/15/17 1900  . amiodarone 30 mg/hr (10/15/17 2030)  . ceFTAROline (TEFLARO) IV Stopped (10/16/17 0010)  . dexmedetomidine  (PRECEDEX) IV infusion for high rates 1.2 mcg/kg/hr (10/16/17 0650)  . dextrose 5 % Impella 5.0 Purge solution 4 mL/hr at 10/13/17 1808  . famotidine (PEPCID) IV Stopped (10/15/17 1130)  . feeding supplement (VITAL 1.5 CAL) 1,000 mL (10/15/17 2000)  . impella catheter heparin 50 unit/mL in dextrose 5%    . heparin 1,700 Units/hr (10/15/17 2200)  . levETIRAcetam Stopped (10/16/17 0515)  . metronidazole Stopped (10/16/17 0500)  . milrinone 0.375 mcg/kg/min (10/16/17 0014)  . norepinephrine (LEVOPHED) Adult infusion 18 mcg/min (10/16/17 0000)    PRN Medications: sodium chloride, acetaminophen (TYLENOL) oral liquid 160 mg/5 mL, albuterol, fentaNYL (SUBLIMAZE) injection, fentaNYL (SUBLIMAZE) injection, magic mouthwash, midazolam, morphine injection, ondansetron (ZOFRAN) IV, sodium chloride flush, sodium chloride flush    Patient Profile  Mr Dubuque is a 67 year old with a history of PAF S/P DC-CV 11/2016, s/p bilateral inguinal hernia repair 11/2017, PVCs, HTN, NICM, chronic systolic heart failure.   Sent from Urgent Care with A fib RVR. Acutely SOB on arrival   Assessment/Plan   1. Acute hypoxemic respiratory failure: RLL PNA, Strep pneumoniae in blood cultures. Respiratory cultures with parainfluenzae virus. Also acute/chronic systolic CHF.Now intubated s/p Impella 5.0 placement, multifocal PNA on CXR and CT chest. Remains critically ill.  Antibiotics broadened from ceftriaxone to vancomycin/meropenem on 1/30. BAL with Candida albicans, anidulafungin started 2/2.  Necrotizing PNA with complex pleural effusion on right on 2/5 CT, anti-microbials changed to ceftaroline and Flagyl on 2/5.   - Right chest tube placed 1/31, still with bloody drainage. Drainage picking back up.   2.Acute on chronic systolic CHF-> cardiogenic shock : Nonischemic cardiomyopathy. 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 Etiology of his CHF  is not clear =>no definite inciting event. He has a history of HTN but doubt this was the only trigger. 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 This would ideally be confirmed by cMRI, but he has not wanted an MRI (concerned about side effects). He also has frequent PVCs, 21% total on last holter in 4/18 which is a risk for fall in EF.No family history of CMP. Cannot rule out viral myocarditis. SPEP negative. With medical management, he initially felt a lot better. However, he quit all his meds in early 2018 with recurrence of NYHA III symptoms and onset of atrial fibrillation.He had TEE-DCCV in 3/18.TEE showed that EF remained25%. He quit his meds again in 4/18 and apparently did not restart them when I asked him to in 6/18. No meds probably since 4/18. Echo this admission with EF 15-20%, severe RV dysfunction, LV apical thrombus. RV is hypokinetic.  Attempted DCCV on 1/28 failed likely due to pressors/inotropes.  [place] 1/29 with low cardiac output => Impella 5.0 placed 1/30.  CI 2.3 today but co-ox low at 45%. Off diuretics with CVP 5-6.  - No diuretics, will try to increase CVP some with blood today.  - Continue current support of RV with milrinone and norepinephrine, with low co-ox will increase milrinone back to 0.5. - Continue Impella 5.0 at P8. LDH plateaued--> [phone].  No Impella alarms overnight, good flow.  - On heparin gtt.  -  End point difficult to envision => would be poor LVAD candidate with renal failure and RV failure.   3. 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 It is possible that the PVCs contribute to his cardiomyopathy.Mag 2.1 K 4.1 Stable.  -Onamiodarone. No change. 4. Atrial fibrillation:Persistent, now with RVR. HR up to 140sinitially, now 110s on amiodarone gtt at30. Not sure how long he has been in atrial fibrillation with marked RVR, but this may have triggered his CHF and AKI due to worsening of cardiac output.  HR now better  controlled. Remains in A fib.  -Heparin gtt ongoing. - Continue amiodarone gtt at 30 mg/hr.   - Failed TEE-guided DCCV on 1/28 in setting of vasoactive meds.  If we can wean down milrinone/norepinephrine, will re-attempt DCCV in future.   5. AKI: Creatinine 1.08 in 6/18, he has not been on any meds. Suspect cardiorenal syndrome with afib/RVR and fall in cardiac output.He was initially hyperkalemic and acidotic.Creatinine trending back up. 2.2 -->2.5-->2.6  .  6. FB:PZWCH pneumo PNA as well as parainfluenza virus.CT chest 1/29 with multifocal PNA. On  1/30 to vancomycin/meropenem, then anidulafungin added.  Tmax 100.4 despite [employer].  ID has seen. Now on Teflaro and metronidazole. CT of chest/abd/pelvis 2/5 concerning for necrotizing right pneumonia with surrounding complex large pleural effusion.  Yesterday's limited echo showed a small vegetation on a mitral valve chord. - Continue current antibiotics, pending further culture data.   7. Elevated LFTs: Suspect shock liver type picture. - AST/ALT > 1000 at admission but have trended down steadily.  8. LV thrombus: Noted on TTE and TEE, small.  - Back on heparin drip.  9. Hyponatremia: resolved.  10. Malnutrition: tube feeds.    11. Anemia: Significant blood loss via chest tube and mouth.  ?Hemolysis from Impella playing a role => need higher CVP, providing with blood and volume.  Good position Impella position after readjustment yesterday, no alarms overnight.  Received 2UPRBCs 2/3, 2/4 5 uPRBCs, 2/5 2UPRBCs.  Hgb 7.8.  - Transfuse 2 units today.   12. Suspected Deep Tissue Injury -->Sacrum/R buttock/Mid back . Continue to repostion R/L. WOC consult appreciated.   13. Seizure- Neurology appreciated. CT head negative. EEG no seizure noted.    Amy Clegg NP-C  7:14 AM   Patient seen with NP, agree with the above note. He is remains critically ill and very tenuous. On [place], temperature still up to 100.4.  CT yesterday with right  necrotizing PNA and likely empyema.  Chest tube remains in place with ongoing bloody drainage.    On exam, intubated but per family intermittently able follow commands. Has RIJ Swan and left subclavian central line. JVP not elevated. 2+ edema to at ankles. Extremities cool. Decreased breath sounds dependently. Sacral decubitus.   See plan outlined in above note.  No Impella alarms and good flow, LDH with slight down-trend.  Haptoglobin < 10.  Hemoglobin 7.8, will give 2 units PRBCs and no Lasix (maintain CVP around 10 goal). - Keep Impella at P8. - Will check position of Impella today via echo.  -  Do not think we can wean Impella speed further today with marginal cardiac output.  - Continue heparin gtt.   Co-ox lower today at 45% despite good cardiac index at 2.3.  Will increase milrinone back to 0.5.   Atrial fibrillation rate controlled on amiodarone.    He is now on Teflaro + Flagyl with necrotizing PNA and suspected empyema on right.  Also noted to have small vegetation on MV chord.   Unable to wean current support.  Developing decubitus ulcer.  At this time, long-term prognosis is poor.  He is not a candidate for LVAD at this point.  Have discussed poor prognosis again with family, they want to continue aggressive treatment for the time being.  They would like him to remain full code and do not want palliative care evaluation.   CRITICAL CARE Performed KN:[id number] Alvah Lagrow  Total critical care time:46mnutes  Critical care time was exclusive of separately billable procedures and treating other patients.  Critical care was necessary to treat or prevent imminent or life-threatening deterioration.  Critical care was time spent personally by me on the following activities: development of treatment plan with patient and/or surrogate as well as nursing, discussions with consultants, evaluation of patient's response to treatment, examination of patient, obtaining history from  patient or surrogate, ordering and performing treatments and interventions, ordering and review of laboratory studies, ordering and review of radiographic studies, pulse oximetry and re-evaluation of patient's condition.  DLoralie Champagne2/02/2018 8:32 AM

## 2017-10-16 NOTE — Progress Notes (Signed)
CT surgery p.m. Rounds  Patient more alert Minimal bleeding in axillary artery incision site, currently on IV heparin 1700 units Doppler pulses in extremities Hemoglobin 8.4

## 2017-10-16 NOTE — Progress Notes (Signed)
PULMONARY / CRITICAL CARE MEDICINE   Name: Bryan Harmon MRN: [id number] DOB: 1951-04-03    ADMISSION DATE:  09/29/2017  REFERRING MD:  Nils Pyle  CHIEF COMPLAINT:  Fatigue  HISTORY OF PRESENT ILLNESS:   67 y/o male with non-ischemic cardiomyopathy and afib admitted with decompensated heart failure.  Required Impella, vent.     Lines/ Tubes 1/30 L IJ CVL > 1/31 1/30 R IJ swan > 1/30 R sub clav impella > 1/30 L radial arterial line > 10/15/2017 1/30 ETT >  1/31 L [place] TLC >  10/15/2017 right femoral arterial line placed>>   Cultures: 1/21 Blood >> Strep Pneumoniae >> pan sens 1/21 Resp Viral Panel >> + Parainfluenza Virus 3 1/28 blood > ng 1/30 blood > ng 1/31 resp culture > ng 2/1 BAL > candida 10/15/2017 blood cultures x2>> 10/15/2017 tracheal aspirate>> 10/15/2017 urine culture>>  ABX: Infectious disease consult 10/14/2017 Rocephin 1/21 >> 1/29 Doxy 1/21 >> 1/23 Vanc 1/30 >> 10/14/2017 Mero 1/30 >>  Eraxis 2/2 >> off   Studies: 10/01/2017>> Echo EF 15-20%, Apex ? PAP muscle vs. Thrombus, LV severely dilated, moderate concentric hypertrophy, systolic function normal, unable to evaluate LV diastolic function due to atrial fibrillation. + moderate spontaneous echo contrast, indicative of stasis Impression 1/23 Echo > Severely dilated LV with severe LV dysfunction globally with EF 15-20%. Severely dialted RV with severe RV dysfunction. Moderate to severe MR with ERO 0.33cm2 and MR volume 16m. Moderate tosevere TR with moderate pulmonary HTN. Moderately thickened and calcified AV leaflets with mild MR, mildly dilated aortic root, massive biatrial enlargement. Cannot rule out LV thrombus. Thereis significant spontaneous echo contrast in LV c/w sluggish blood flow. The right ventricular systolic pressure was increasedconsistent with moderate pulmonary hypertension. 1/29 CT chest > dense bilateral lower lobe consolidation R>L 1/30 TEE >> LVEF 20-25%, impella in place, poor  overall contractility but LV improved    SUBJECTIVE:   Fever down to 100.0, currently on Arctic sun, no further seizures since 10/15/1998.  Remains on multiple pressors, mechanical assist devices and inotropes. Milrinone drip Amiodarone drip Norepinephrine drip Heparin drip Precedex drip Impella  VITAL SIGNS: BP (!) 87/58   Pulse (!) 123   Temp 99 F (37.2 C)   Resp 14   Ht _0  (1.778 m)   Wt 90.7 kg (200 lb 0.4 oz)   SpO2 99%   BMI 28.70 kg/m   HEMODYNAMICS: PAP: (21-44)/(14-35) 24/15 CVP:  [2 mmHg-16 mmHg] 4 mmHg CO:  [4 L/min-4.7 L/min] 4.7 L/min CI:  [2 L/min/m2-2.4 L/min/m2] 2.4 L/min/m2  VENTILATOR SETTINGS: Vent Mode: PCV FiO2 (%):  [40 %] 40 % Set Rate:  [15 bmp] 15 bmp PEEP:  [5 cmH20] 5 cmH20 Plateau Pressure:  [12 cmH20-15 cmH20] 15 cmH20  INTAKE / OUTPUT: I/O last 3 completed shifts: In: 8539.7 [I.V.:3427.2; Blood:937.5; Other:105; NG/GT:2920; IV PATFTDDUKG:2542]Out: 37062[Urine:2200; Chest Tube:1190]  PHYSICAL EXAMINATION: General: Volume overloaded male who responds to verbal stimulus HEENT: Endotracheal tube connected to ventilator, feeding tube with tube feeding PSY: Sedated Neuro: Follows commands intermittently CV: Heart sounds are regular PULM: Decreased breath sounds throughout, right chest tube without air leak intermittent drainage from 0-50 cc GBJ:SEGB non-tender, bsx4 active  Extremities: warm/dry, 3+ edema  Skin: no rashes or lesions    LABS:  BMET Recent Labs  Lab 10/15/17 0302 10/15/17 1330 10/15/17 1512 10/16/17 0404  NA 136 137 136 137  K 4.1 4.3 4.2 4.2  CL 101 102 99* 102  CO2 23 24  --  23  BUN 90* 92* 85* 96*  CREATININE 2.53* 2.47* 2.50* 2.66*  GLUCOSE 179* 132* 109* 127*    Electrolytes Recent Labs  Lab 10/10/17 1741 10/11/17 0355  10/14/17 0240 10/15/17 0302 10/15/17 1330 10/16/17 0404  CALCIUM 7.6* 7.7*   < > 7.2* 7.3* 7.2* 7.3*  MG 2.1 2.0   < > 2.1 2.1  --  2.1  PHOS 3.0 3.2  --   --   --   --   4.4   < > = values in this interval not displayed.    CBC Recent Labs  Lab 10/14/17 1743 10/15/17 0302 10/15/17 1330 10/15/17 1512 10/16/17 0404  WBC 13.4* 15.9*  --   --  19.4*  HGB 8.4* 7.8* 8.6* 8.2* 7.8*  HCT 25.0* 23.6* 25.2* 24.0* 23.0*  PLT 81* 75*  --   --  102*    Coag's Recent Labs  Lab 10/14/17 0239 10/15/17 0302 10/16/17 0404  APTT 115* 105* 82*    Sepsis Markers Recent Labs  Lab 10/10/17 0926 10/11/17 0355 10/12/17 0359  PROCALCITON 8.49 6.65 5.76    ABG Recent Labs  Lab 10/14/17 1429 10/15/17 0628 10/16/17 0439  PHART 7.438 7.505* 7.490*  PCO2ART 43.6 38.0 34.2  PO2ART 134.0* 116.0* 86.3    Liver Enzymes Recent Labs  Lab 10/13/17 0253 10/14/17 0240 10/15/17 1029  AST 65* 63* 68*  ALT 69* 60 44  ALKPHOS 76 69 75  BILITOT 1.2 1.2 1.8*  ALBUMIN 1.8* 1.7* 1.6*    Cardiac Enzymes No results for input(s): TROPONINI, PROBNP in the last 168 hours.  Glucose Recent Labs  Lab 10/15/17 0829 10/15/17 1229 10/15/17 2018 10/16/17 0009 10/16/17 0433 10/16/17 0832  GLUCAP 162* 133* 148* 171* 137* 135*    Imaging Ct Abdomen Pelvis Wo Contrast  Result Date: 10/15/2017 CLINICAL DATA:  Fever of unknown origin. EXAM: CT CHEST, ABDOMEN AND PELVIS WITHOUT CONTRAST TECHNIQUE: Multidetector CT imaging of the chest, abdomen and pelvis was performed following the standard protocol without IV contrast. COMPARISON:  Chest CT 09/14/2017 FINDINGS: CT CHEST FINDINGS Cardiovascular: The heart is globally enlarged and stable. Stable position of the Impella device. No pericardial effusion. Mediastinum/Nodes: Numerous scattered mediastinal and hilar lymph nodes. The esophagus contains a feeding tube. Lungs/Pleura: Complex right pleural fluid collection worrisome for empyema or hemothorax despite the chest tube. Complex overlying infiltrate worrisome for necrotizing pneumonia. Small left effusion and overlying infiltrate with low-attenuation also worrisome for  aggressive pneumonia. Progressive finding since the prior CT scan. Musculoskeletal: No significant bony findings. CT ABDOMEN PELVIS FINDINGS Hepatobiliary: No focal hepatic lesions. The gallbladder is distended and contains layering gallstones. Pancreas: No mass, inflammation or ductal dilatation. Spleen: Grossly normal. Adrenals/Urinary Tract: Grossly normal. Stomach/Bowel: The stomach contains a feeding tube. The duodenum, small bowel and colon are grossly normal. No obstruction or mass. Vascular/Lymphatic: Stable vascular calcifications. Scattered mesenteric and retroperitoneal lymph nodes but no obvious mass or overt adenopathy. Reproductive: Foley catheter in the bladder. Prostate gland and seminal vesicles are grossly normal. Other: Mesenteric edema and small volume abdominal/pelvic ascites. There is also diffuse body wall edema suggesting anasarca. Musculoskeletal: No significant findings. IMPRESSION: 1. Worsening bilateral pulmonary infiltrates with areas of low attenuation worrisome for necrotizing pneumonia. 2. Large right and small left pleural effusions. The right effusion is larger and is complicated by high attenuation material worrisome for empyema or pleural hemorrhage despite the chest tube. 3. Stable cardiac enlargement and stable support apparatus. 4. Diffuse mesenteric edema and small volume abdominal/pelvic ascites. There  is also diffuse body wall edema suggesting anasarca. 5. Distended gallbladder with layering gallstones. No obvious acute cholecystitis. This is likely cholestasis. Right upper quadrant ultrasound may be helpful for further evaluation. Electronically Signed   By: Marijo Sanes M.D.   On: 10/15/2017 16:10   Ct Chest Wo Contrast  Result Date: 10/15/2017 CLINICAL DATA:  Fever of unknown origin. EXAM: CT CHEST, ABDOMEN AND PELVIS WITHOUT CONTRAST TECHNIQUE: Multidetector CT imaging of the chest, abdomen and pelvis was performed following the standard protocol without IV contrast.  COMPARISON:  Chest CT 09/10/2017 FINDINGS: CT CHEST FINDINGS Cardiovascular: The heart is globally enlarged and stable. Stable position of the Impella device. No pericardial effusion. Mediastinum/Nodes: Numerous scattered mediastinal and hilar lymph nodes. The esophagus contains a feeding tube. Lungs/Pleura: Complex right pleural fluid collection worrisome for empyema or hemothorax despite the chest tube. Complex overlying infiltrate worrisome for necrotizing pneumonia. Small left effusion and overlying infiltrate with low-attenuation also worrisome for aggressive pneumonia. Progressive finding since the prior CT scan. Musculoskeletal: No significant bony findings. CT ABDOMEN PELVIS FINDINGS Hepatobiliary: No focal hepatic lesions. The gallbladder is distended and contains layering gallstones. Pancreas: No mass, inflammation or ductal dilatation. Spleen: Grossly normal. Adrenals/Urinary Tract: Grossly normal. Stomach/Bowel: The stomach contains a feeding tube. The duodenum, small bowel and colon are grossly normal. No obstruction or mass. Vascular/Lymphatic: Stable vascular calcifications. Scattered mesenteric and retroperitoneal lymph nodes but no obvious mass or overt adenopathy. Reproductive: Foley catheter in the bladder. Prostate gland and seminal vesicles are grossly normal. Other: Mesenteric edema and small volume abdominal/pelvic ascites. There is also diffuse body wall edema suggesting anasarca. Musculoskeletal: No significant findings. IMPRESSION: 1. Worsening bilateral pulmonary infiltrates with areas of low attenuation worrisome for necrotizing pneumonia. 2. Large right and small left pleural effusions. The right effusion is larger and is complicated by high attenuation material worrisome for empyema or pleural hemorrhage despite the chest tube. 3. Stable cardiac enlargement and stable support apparatus. 4. Diffuse mesenteric edema and small volume abdominal/pelvic ascites. There is also diffuse body  wall edema suggesting anasarca. 5. Distended gallbladder with layering gallstones. No obvious acute cholecystitis. This is likely cholestasis. Right upper quadrant ultrasound may be helpful for further evaluation. Electronically Signed   By: Marijo Sanes M.D.   On: 10/15/2017 16:10      DISCUSSION: 67 y/o male with ischemic cardiomyopathy admitted with decompensated systolic heart failure in the setting of strep pneumo pneumonia and bacteremia s/p impella.  He has acute on chronic kidney failure, cardiogenic shock.  10/15/2017 with fever 105.6, pancultured 01/2018, ID consult 10/14/2017. 's overall cardiac function has declined despite all interventions.  Family wants to continue with maximum interventions.  The question of CODE STATUS was again addressed on 10/16/2017 per cardiology with family again.  CODE STATUS is retained.  ASSESSMENT / PLAN:  PULMONARY A: Acute respiratory failure with hypoxemia Pneumococcal CAP - RLL  Acute pulmonary edema Bilateral pleural effusions , R >> L P:   Continue pressure control ventilation for which she is tolerating well. We will not push weaning due to his febrile state, suspected seizures, and low cardiac output state.  CARDIOVASCULAR A:  Acute decompensated cardiomyopathy > severe biventricular failure, LVEF ~ 5% S/p Impella 1/30 Cardiogenic Shock  Medical Non-Compliance - stopped taking all meds prior to admit for CHF, ? Long term plan 10/11/2017 right chest tube P:  Volume management, inotrope management as per advanced heart failure team plans, TCTS plans.  Ct levophed/ milrinone gtt Amiodarone drip Poor prognosis with  worsening multiorgan dysfunction as of 10/15/2017 Right chest tube continues to drain and he is on a heparin drip. Consider DC of the left radial A-line in place with femoral if we continue aggressive care.   RENAL Lab Results  Component Value Date   CREATININE 2.66 (H) 10/16/2017   CREATININE 2.50 (H) 10/15/2017   CREATININE  2.47 (H) 10/15/2017   CREATININE 1.11 03/25/2012   Recent Labs  Lab 10/15/17 1330 10/15/17 1512 10/16/17 0404  K 4.3 4.2 4.2    A:   Worsening renal failure with creatinine 2.53 on 10/15/2017 Hypokalemia/hypervolemic P:   Continue to follow urine output, BMP Replace electrolytes as indicated Avoid nephrotoxic agents, ensure adequate renal perfusion  GASTROINTESTINAL A:   No acute issues P:   Tube feeding per nutrition Pepcid IV for ulcer prophylaxis  HEMATOLOGIC Recent Labs    10/15/17 1512 10/16/17 0404  HGB 8.2* 7.8*   Lab Results  Component Value Date   INR 1.33 09/17/2017   INR 1.10 07/05/2016   INR 0.98 10/08/2012    A:   Anemia, thrombocytopenia - blood loss via chest tube Concern for hemolytic anemia with impella, note high LDH P:  PRBCs per CHF service, goal Hb 8 & above Transfuse 4 units of packed cells 10/14/2017 Follow CBC and transfusion per cardiology service with 2 units of packed cells on 10/15/2017 Remains on heparin  INFECTIOUS A:   Strep Pneumonia Bacteremia - 1/21 - completed 10 ds of rocephin Severe CAP HCAP? >  No evidence thus far based on BAL from 2/1 Fever 1/30 - new, considering RLL, sinusitis with NGT, multiple lines Parainfluenza virus pneumonia Persistent right pleural effusion appears complex and could be empyema despite right chest tube P:   Vancomycin discontinued on 10/14/2017 Continue meropenem Infectious disease consult 10/14/2017 Suspect that yeast in BAL is a contaminant, empiric  Consider drug fever - ID consulted ,note pct was high Note 10/15/2017 fever was 105.6 he was placed on Arctic sun for cooling. 10/15/2017 due to fever spike multiple invasive catheters we will pan culture 10/15/2017 Spoke with cardiovascular thoracic surgeons 10/16/2017 no indication for further chest tube.  Question of lytic therapy in the future.  ENDOCRINE CBG (last 3)  Recent Labs    10/16/17 0009 10/16/17 0433 10/16/17 0832  GLUCAP 171* 137*  135*    A:   Hyperglycemia  P:   Sliding scale insulin per protocol  NEUROLOGIC A:   Sedation Need / Mechanical Ventilation  Suspected seizure 10/14/2017 neurology consult Suspect he may be secondary to febrile state. EEG performed P:   RASS goal: -1  Sedation protocol: -1> continue precedex, PRN fentanyl, versed  Neurology consult as noted.  10/16/2017 neurology plans to sign off. EEG with no seizure activity noted.  FAMILY  - Updates: 10/16/2017 family updated extensively by Dr. Marigene Ehlers in the subject of CODE STATUS was broached.  Family wants full CODE STATUS.  Note the impela.  Is reaching end of life expectancy  App CCT 45 min  Richardson Landry Minor ACNP Maryanna Shape PCCM Pager [phone] till 1 pm If no answer page [phone] 10/16/2017, 9:08 AM  Attending Note:  67 year old male with extensive cardiac history who is now in cardiogenic shock with a pneumonia and CT is in place for empyema.  Impella device in place but evidently at the end of its usability.  On exam, patient aroses easily but diffuse crackles noted on exam.  I reviewed CXR myself, CT and ETT are in good position.  Discussed with PCCM-NP, ID-MD and bedside nurse.  Cardiology feels that we are at the limit of what we can do from a medical standpoint.  I spoke with son and wife extensively for over an hour.  Discussed the entire case with them and the rational for DNR status and was very frank that patient will not survive this.  Answered all question.  Dr. Drucilla Schmidt came in during our discussion and confirmed that we are essentially at the end of what we can do.  The family expressed understanding but continued to insist on full code status.  Will continue discussions.  The patient is critically ill with multiple organ systems failure and requires high complexity decision making for assessment and support, frequent evaluation and titration of therapies, application of advanced monitoring technologies and extensive interpretation of  multiple databases.   Critical Care Time devoted to patient care services described in this note is  95  Minutes. This time reflects time of care of this signee Dr Jennet Maduro. This critical care time does not reflect procedure time, or teaching time or supervisory time of PA/NP/Med student/Med Resident etc but could involve care discussion time.  Rush Farmer, M.D. [facility], [place] [facility]. Pager: [phone]. After hours pager: [phone].

## 2017-10-16 NOTE — Procedures (Signed)
ELECTROENCEPHALOGRAM REPORT  Date of Study: 10/16/2017  Patient's Name: MARTRAVIOUS FRYSON MRN: [id number] Date of Birth: 01-Dec-1950  Referring Provider: Dr. Ritta Slot  Clinical History: This is a 67 year old man with altered mental status.  Medications: Keppra  Precedex  Technical Summary: A multichannel digital EEG recording measured by the international 10-20 system with electrodes applied with paste and impedances below 5000 ohms performed as portable with EKG monitoring in an intubated and sedated patient.  Hyperventilation and photic stimulation were not performed.  The digital EEG was referentially recorded, reformatted, and digitally filtered in a variety of bipolar and referential montages for optimal display.   Description: The patient is intubated and sedated during the recording. There is no clear posterior dominant rhythm seen. The background consists of a large amount of diffuse 4-7 Hz theta and 2-3 Hz delta slowing. Normal sleep architecture is not seen. With stimulation, there is an increase in muscle artifact over the temporal regions. Hyperventilation and photic stimulation were not performed. There were no epileptiform discharges or electrographic seizures seen.    EKG lead showed tachycardia with irregular rhythm.  Impression: This sedated EEG is abnormal due to moderate diffuse background slowing.  Clinical Correlation of the above findings indicates diffuse cerebral dysfunction that is non-specific in etiology and can be seen with hypoxic/ischemic injury, toxic/metabolic encephalopathies, neurodegenerative disorders, or medication effect from Precedex.  The absence of epileptiform discharges does not rule out a clinical diagnosis of epilepsy.  Clinical correlation is advised.   Patrcia Dolly, M.D.

## 2017-10-16 NOTE — Progress Notes (Signed)
  Echocardiogram 2D Echocardiogram has been performed.  Janalyn Harder 10/16/2017, 9:35 AM

## 2017-10-16 NOTE — Progress Notes (Signed)
No blood perform discharges on EEG, I would discontinue Keppra.  Please call if there are any further episodes of concern.  Ritta Slot, MD [facility] [phone]  If 7pm- 7am, please page neurology on call as listed in AMION.

## 2017-10-16 NOTE — Progress Notes (Signed)
  Echocardiogram 2D Echocardiogram has been performed.  Bryan Harmon 10/16/2017, 12:23 PM

## 2017-10-16 NOTE — Progress Notes (Signed)
ANTICOAGULATION CONSULT NOTE - Follow Up Consult  Pharmacy Consult for Heparin Indication: atrial fibrillation and LA thrombus  No Known Allergies  Patient Measurements: Height: 5\' 10"  (177.8 cm) Weight: 200 lb 0.4 oz (90.7 kg) IBW/kg (Calculated) : 73   Vital Signs: Temp: 99 F (37.2 C) (02/06 1455) Temp Source: Core (02/06 1317) BP: 89/58 (02/06 1400) Pulse Rate: 131 (02/06 1455)  Labs: Recent Labs    10/14/17 0239  10/14/17 1743 10/15/17 0302  10/15/17 1512 10/16/17 0404 10/16/17 1550  HGB  --    < > 8.4* 7.8*   < > 8.2* 7.8* 8.5*  HCT  --    < > 25.0* 23.6*   < > 24.0* 23.0* 25.0*  PLT  --    < > 81* 75*  --   --  102*  --   APTT 115*  --   --  105*  --   --  82*  --   HEPARINUNFRC 0.57  --   --  0.33  --   --  0.23*  --   CREATININE  --    < >  --  2.53*   < > 2.50* 2.66* 2.80*   < > = values in this interval not displayed.    Estimated Creatinine Clearance: 29.8 mL/min (A) (by C-G formula based on SCr of 2.8 mg/dL (H)).  Assessment: 66yom known to pharmacy for heparin management with impella. Impella removed today due to stoppage from small vegetation that got sucked into the catheter. Pharmacy asked to resume heparin ~ 4 hours after removal for afib and LA thrombus. Heparin level this morning on 1700 units/hr systemic and 135 units/hr purge was 0.23. Dr. Maren Beach would like to aim for a level ~ 0.3. Hgb was low this morning 7.8 and he received 2 units PRBCs. Chest tube still with bloody drainage.   Goal of Therapy:  Heparin level 0.3 units/ml Monitor platelets by anticoagulation protocol: Yes   Plan:  1) Resume heparin at 1650 units/hr now. 2) Check 6 hour heparin level 3) Daily heparin level and CBC  Tad Moore, BCPS  Clinical Pharmacist Pager [phone]  10/16/2017 4:35 PM

## 2017-10-16 NOTE — Progress Notes (Signed)
Subjective: No new complaints. Family at bedside, discussed the plan from an ID standpoint.  Patient more interactive yesterday.   Antibiotics:  Anti-infectives (From admission, onward)   Start     Dose/Rate Route Frequency Ordered Stop   10/15/17 1200  ceftaroline (TEFLARO) 400 mg in sodium chloride 0.9 % 250 mL IVPB     400 mg 250 mL/hr over 60 Minutes Intravenous Every 12 hours 10/15/17 1052     10/15/17 1100  metroNIDAZOLE (FLAGYL) IVPB 500 mg     500 mg 100 mL/hr over 60 Minutes Intravenous Every 8 hours 10/15/17 1052     10/13/17 1200  anidulafungin (ERAXIS) 100 mg in sodium chloride 0.9 % 100 mL IVPB  Status:  Discontinued     100 mg 78 mL/hr over 100 Minutes Intravenous Every 24 hours 10/12/17 1123 10/14/17 1238   10/12/17 1200  anidulafungin (ERAXIS) 200 mg in sodium chloride 0.9 % 200 mL IVPB     200 mg 78 mL/hr over 200 Minutes Intravenous  Once 10/12/17 1123 10/12/17 1702   09/20/2017 2000  vancomycin (VANCOCIN) IVPB 1000 mg/200 mL premix  Status:  Discontinued     1,000 mg 200 mL/hr over 60 Minutes Intravenous Every 24 hours 09/13/2017 1754 10/14/17 0951   10/04/2017 1800  meropenem (MERREM) 1 g in sodium chloride 0.9 % 100 mL IVPB  Status:  Discontinued     1 g 200 mL/hr over 30 Minutes Intravenous Every 12 hours 09/14/2017 1720 10/15/17 1052   10/03/2017 0912  vancomycin (VANCOCIN) 1,000 mg in sodium chloride 0.9 % 1,000 mL irrigation  Status:  Discontinued       As needed 09/19/2017 0912 09/18/2017 1115   09/24/2017 0700  fluconazole (DIFLUCAN) IVPB 400 mg  Status:  Discontinued     400 mg 100 mL/hr over 120 Minutes Intravenous To Surgery 09/19/2017 0650 09/18/2017 1115   09/13/2017 0700  rifampin (RIFADIN) 600 mg in sodium chloride 0.9 % 100 mL IVPB  Status:  Discontinued     600 mg 200 mL/hr over 30 Minutes Intravenous To Surgery 09/15/2017 0650 09/24/2017 1115   10/02/2017 0700  vancomycin (VANCOCIN) powder 1,000 mg  Status:  Discontinued     1,000 mg Other To Surgery 10/03/2017  0650 09/23/2017 1115   09/14/2017 0400  vancomycin (VANCOCIN) 1,250 mg in sodium chloride 0.9 % 250 mL IVPB     1,250 mg 166.7 mL/hr over 90 Minutes Intravenous To Surgery 09/16/2017 1910 09/27/2017 1222   09/23/2017 0400  cefUROXime (ZINACEF) 1.5 g in dextrose 5 % 50 mL IVPB     1.5 g 100 mL/hr over 30 Minutes Intravenous To Surgery 09/10/2017 1910 09/16/2017 1202   09/14/2017 0400  cefUROXime (ZINACEF) 750 mg in dextrose 5 % 50 mL IVPB  Status:  Discontinued     750 mg 100 mL/hr over 30 Minutes Intravenous To Surgery 09/24/2017 1910 09/29/2017 1115   10/01/17 0600  cefTRIAXone (ROCEPHIN) 2 g in dextrose 5 % 50 mL IVPB  Status:  Discontinued     2 g 100 mL/hr over 30 Minutes Intravenous Every 24 hours 10/01/17 0541 09/20/2017 1705   10/03/2017 1315  doxycycline (VIBRA-TABS) tablet 100 mg  Status:  Discontinued     100 mg Oral Every 12 hours 09/18/2017 1301 10/02/17 1346   10/01/2017 1315  cefTRIAXone (ROCEPHIN) 1 g in dextrose 5 % 50 mL IVPB  Status:  Discontinued     1 g 100 mL/hr over 30 Minutes Intravenous Every 24  hours 09/10/2017 1301 10/01/17 0541     Medications: Scheduled Meds: . chlorhexidine gluconate (MEDLINE KIT)  15 mL Mouth Rinse BID  . Chlorhexidine Gluconate Cloth  6 each Topical Daily  . feeding supplement (PRO-STAT SUGAR FREE 64)  30 mL Per Tube TID  . Gerhardt's butt cream   Topical BID  . insulin aspart  2-6 Units Subcutaneous Q4H  . insulin glargine  12 Units Subcutaneous BID  . mouth rinse  15 mL Mouth Rinse 10 times per day  . sennosides  5 mL Oral Daily  . sodium chloride flush  3 mL Intravenous Q12H   Continuous Infusions: . sodium chloride 20 mL/hr at 10/15/17 0608  . sodium chloride Stopped (10/15/17 0220)  . sodium chloride 20 mL/hr at 10/15/17 1900  . sodium chloride    . amiodarone 30 mg/hr (10/15/17 2030)  . ceFTAROline (TEFLARO) IV Stopped (10/16/17 0010)  . dexmedetomidine (PRECEDEX) IV infusion for high rates 1.2 mcg/kg/hr (10/16/17 0650)  . dextrose 5 % Impella 5.0 Purge  solution 4 mL/hr at 10/13/17 1808  . famotidine (PEPCID) IV Stopped (10/15/17 1130)  . feeding supplement (VITAL 1.5 CAL) 1,000 mL (10/15/17 2000)  . impella catheter heparin 50 unit/mL in dextrose 5%    . heparin 1,700 Units/hr (10/15/17 2200)  . levETIRAcetam Stopped (10/16/17 0515)  . metronidazole Stopped (10/16/17 0500)  . milrinone 0.375 mcg/kg/min (10/16/17 0014)  . norepinephrine (LEVOPHED) Adult infusion 18 mcg/min (10/16/17 0000)   PRN Meds:.sodium chloride, acetaminophen (TYLENOL) oral liquid 160 mg/5 mL, albuterol, fentaNYL (SUBLIMAZE) injection, fentaNYL (SUBLIMAZE) injection, magic mouthwash, midazolam, morphine injection, ondansetron (ZOFRAN) IV, sodium chloride flush, sodium chloride flush  Objective: Weight change: 9 lb 7.7 oz (4.3 kg)  Intake/Output Summary (Last 24 hours) at 10/16/2017 0826 Last data filed at 10/16/2017 0700 Gross per 24 hour  Intake 6168.55 ml  Output 2205 ml  Net 3963.55 ml   Blood pressure 94/69, pulse (!) 123, temperature 99.1 F (37.3 C), resp. rate 18, height _0  (1.778 m), weight 200 lb 0.4 oz (90.7 kg), SpO2 98 %. Temp:  [96.8 F (36 C)-100.4 F (38 C)] 99.1 F (37.3 C) (02/06 0700) Pulse Rate:  [100-133] 123 (02/06 0700) Resp:  [12-28] 18 (02/06 0700) BP: (74-124)/(30-99) 94/69 (02/06 0700) SpO2:  [90 %-100 %] 98 % (02/06 0700) Arterial Line BP: (65-107)/(50-82) 92/64 (02/06 0700) FiO2 (%):  [40 %] 40 % (02/06 0309) Weight:  [200 lb 0.4 oz (90.7 kg)] 200 lb 0.4 oz (90.7 kg) (02/06 0300)  Physical Exam: General: Intubated, not as responsive this AM HEENT: Anicteric sclera, pupils reactive to light and accommodation, EOMI CVS: Tachycardic, no murmur rubs or gallops Chest: Diminished breath sounds bilaterally, faint crackles on the right  Abdomen: Soft nontender, nondistended, hypoactive bowel sounds Extremities: Diffuse edema of the upper and lower extremities. Some pitting edema of the LEs Lymph: No new lymphadenopathy Neuro:  Withdrawals to painful stimuli   CBC: _1 (wbc3,Hgb:3,Hct:3,Plt:3,INR:3APTT:3)@  BMET Recent Labs    10/15/17 1330 10/15/17 1512 10/16/17 0404  NA 137 136 137  K 4.3 4.2 4.2  CL 102 99* 102  CO2 24  --  23  GLUCOSE 132* 109* 127*  BUN 92* 85* 96*  CREATININE 2.47* 2.50* 2.66*  CALCIUM 7.2*  --  7.3*   Liver Panel  Recent Labs    10/14/17 0240 10/15/17 1029  PROT 4.2* 3.6*  ALBUMIN 1.7* 1.6*  AST 63* 68*  ALT 60 44  ALKPHOS 69 75  BILITOT 1.2 1.8*  BILIDIR  --  0.5  IBILI  --  1.3*   Sedimentation Rate No results for input(s): ESRSEDRATE in the last 72 hours. C-Reactive Protein No results for input(s): CRP in the last 72 hours.  Micro Results: Recent Results (from the past 720 hour(s))  Culture, blood (Routine X 2) w Reflex to ID Panel     Status: Abnormal   Collection Time: 09/11/2017  1:08 PM  Result Value Ref Range Status   Specimen Description BLOOD RIGHT HAND  Final   Special Requests   Final    BOTTLES DRAWN AEROBIC AND ANAEROBIC Blood Culture adequate volume   Culture  Setup Time   Final    GRAM POSITIVE COCCI AEROBIC BOTTLE ONLY CRITICAL RESULT CALLED TO, READ BACK BY AND VERIFIED WITH: J LEDFORD PHARMD 0532 10/01/17 A BROWNING    Culture STREPTOCOCCUS PNEUMONIAE (A)  Final   Report Status 10/03/2017 FINAL  Final   Organism ID, Bacteria STREPTOCOCCUS PNEUMONIAE  Final      Susceptibility   Streptococcus pneumoniae - MIC*    ERYTHROMYCIN <=0.12 SENSITIVE Sensitive     LEVOFLOXACIN 0.5 SENSITIVE Sensitive     PENICILLIN (non-meningitis) 0.25 SENSITIVE Sensitive     CEFTRIAXONE (non-meningitis) <=0.12 SENSITIVE Sensitive     * STREPTOCOCCUS PNEUMONIAE  Blood Culture ID Panel (Reflexed)     Status: Abnormal   Collection Time: 09/18/2017  1:08 PM  Result Value Ref Range Status   Enterococcus species NOT DETECTED NOT DETECTED Final   Listeria monocytogenes NOT DETECTED NOT DETECTED Final   Staphylococcus species NOT DETECTED NOT DETECTED Final    Staphylococcus aureus NOT DETECTED NOT DETECTED Final   Streptococcus species DETECTED (A) NOT DETECTED Final    Comment: CRITICAL RESULT CALLED TO, READ BACK BY AND VERIFIED WITH: J LEDFORD PHARMD 0532 10/01/17 A BROWNING    Streptococcus agalactiae NOT DETECTED NOT DETECTED Final   Streptococcus pneumoniae DETECTED (A) NOT DETECTED Final    Comment: CRITICAL RESULT CALLED TO, READ BACK BY AND VERIFIED WITH: J LEDFORD PHARMD 0532 10/01/17 A BROWNING    Streptococcus pyogenes NOT DETECTED NOT DETECTED Final   Acinetobacter baumannii NOT DETECTED NOT DETECTED Final   Enterobacteriaceae species NOT DETECTED NOT DETECTED Final   Enterobacter cloacae complex NOT DETECTED NOT DETECTED Final   Escherichia coli NOT DETECTED NOT DETECTED Final   Klebsiella oxytoca NOT DETECTED NOT DETECTED Final   Klebsiella pneumoniae NOT DETECTED NOT DETECTED Final   Proteus species NOT DETECTED NOT DETECTED Final   Serratia marcescens NOT DETECTED NOT DETECTED Final   Haemophilus influenzae NOT DETECTED NOT DETECTED Final   Neisseria meningitidis NOT DETECTED NOT DETECTED Final   Pseudomonas aeruginosa NOT DETECTED NOT DETECTED Final   Candida albicans NOT DETECTED NOT DETECTED Final   Candida glabrata NOT DETECTED NOT DETECTED Final   Candida krusei NOT DETECTED NOT DETECTED Final   Candida parapsilosis NOT DETECTED NOT DETECTED Final   Candida tropicalis NOT DETECTED NOT DETECTED Final  Culture, blood (Routine X 2) w Reflex to ID Panel     Status: None   Collection Time: 09/28/2017  1:11 PM  Result Value Ref Range Status   Specimen Description BLOOD LEFT FOREARM  Final   Special Requests IN PEDIATRIC BOTTLE Blood Culture adequate volume  Final   Culture NO GROWTH 5 DAYS  Final   Report Status 10/05/2017 FINAL  Final  Respiratory Panel by PCR     Status: Abnormal   Collection Time: 09/26/2017  1:51 PM  Result Value Ref Range Status  Adenovirus NOT DETECTED NOT DETECTED Final   Coronavirus 229E NOT  DETECTED NOT DETECTED Final   Coronavirus HKU1 NOT DETECTED NOT DETECTED Final   Coronavirus NL63 NOT DETECTED NOT DETECTED Final   Coronavirus OC43 NOT DETECTED NOT DETECTED Final   Metapneumovirus NOT DETECTED NOT DETECTED Final   Rhinovirus / Enterovirus NOT DETECTED NOT DETECTED Final   Influenza A NOT DETECTED NOT DETECTED Final   Influenza B NOT DETECTED NOT DETECTED Final   Parainfluenza Virus 1 NOT DETECTED NOT DETECTED Final   Parainfluenza Virus 2 NOT DETECTED NOT DETECTED Final   Parainfluenza Virus 3 DETECTED (A) NOT DETECTED Final   Parainfluenza Virus 4 NOT DETECTED NOT DETECTED Final   Respiratory Syncytial Virus NOT DETECTED NOT DETECTED Final   Bordetella pertussis NOT DETECTED NOT DETECTED Final   Chlamydophila pneumoniae NOT DETECTED NOT DETECTED Final   Mycoplasma pneumoniae NOT DETECTED NOT DETECTED Final  Culture, blood (routine x 2)     Status: None   Collection Time: 09/26/2017  4:45 PM  Result Value Ref Range Status   Specimen Description BLOOD LEFT HAND  Final   Special Requests IN PEDIATRIC BOTTLE Blood Culture adequate volume  Final   Culture NO GROWTH 5 DAYS  Final   Report Status 10/05/2017 FINAL  Final  MRSA PCR Screening     Status: None   Collection Time: 10/01/2017  4:49 PM  Result Value Ref Range Status   MRSA by PCR NEGATIVE NEGATIVE Final    Comment:        The GeneXpert MRSA Assay (FDA approved for NASAL specimens only), is one component of a comprehensive MRSA colonization surveillance program. It is not intended to diagnose MRSA infection nor to guide or monitor treatment for MRSA infections.   Culture, blood (routine x 2)     Status: None   Collection Time: 09/25/2017  5:06 PM  Result Value Ref Range Status   Specimen Description BLOOD RIGHT HAND  Final   Special Requests IN PEDIATRIC BOTTLE Blood Culture adequate volume  Final   Culture NO GROWTH 5 DAYS  Final   Report Status 10/05/2017 FINAL  Final  Culture, blood (Routine X 2) w  Reflex to ID Panel     Status: None   Collection Time: 09/17/2017  4:18 PM  Result Value Ref Range Status   Specimen Description BLOOD RIGHT ANTECUBITAL  Final   Special Requests IN PEDIATRIC BOTTLE Blood Culture adequate volume  Final   Culture   Final    NO GROWTH 5 DAYS Performed at [facility], [place] [address] [place], [place] [address]    Report Status 10/12/2017 FINAL  Final  Culture, blood (Routine X 2) w Reflex to ID Panel     Status: None   Collection Time: 09/20/2017  4:20 PM  Result Value Ref Range Status   Specimen Description BLOOD RIGHT ANTECUBITAL  Final   Special Requests IN PEDIATRIC BOTTLE Blood Culture adequate volume  Final   Culture   Final    NO GROWTH 5 DAYS Performed at [facility], [place] [address], [place], [place] [address]    Report Status 10/12/2017 FINAL  Final  Surgical pcr screen     Status: Abnormal   Collection Time: 09/22/2017  8:37 PM  Result Value Ref Range Status   MRSA, PCR RESULT CALLED TO, READ BACK BY AND VERIFIED WITH: (A) NEGATIVE Final    CommentJerilynn Mages [facility] RN 09/15/2017 0504 JDW   Staphylococcus aureus INVALID  RESULTS, SPECIMEN SENT FOR CULTURE (A) NEGATIVE Final    Comment: Results Called to:  M [facility] 09/19/2017 0504 JDW (NOTE) The Xpert SA Assay (FDA approved for NASAL specimens in patients 15 years of age and older), is one component of a comprehensive surveillance program. It is not intended to diagnose infection nor to guide or monitor treatment.   MRSA culture     Status: None   Collection Time: 10/05/2017  8:37 PM  Result Value Ref Range Status   Specimen Description NASOPHARYNGEAL  Final   Special Requests NONE  Final   Culture NO MRSA DETECTED  Final   Report Status 10/10/2017 FINAL  Final  Culture, blood (routine x 2)     Status: None   Collection Time: 09/14/2017  5:52 PM  Result Value Ref Range Status   Specimen Description BLOOD RIGHT ANTECUBITAL  Final   Special Requests IN PEDIATRIC BOTTLE Blood Culture adequate volume   Final   Culture   Final    NO GROWTH 5 DAYS Performed at [facility], [place] [address], [place], [place] [address]    Report Status 10/14/2017 FINAL  Final  Culture, blood (routine x 2)     Status: None   Collection Time: 09/12/2017  6:00 PM  Result Value Ref Range Status   Specimen Description BLOOD RIGHT HAND  Final   Special Requests IN PEDIATRIC BOTTLE Blood Culture adequate volume  Final   Culture   Final    NO GROWTH 5 DAYS Performed at [facility], [place] [address], [place], [place] [address]    Report Status 10/14/2017 FINAL  Final  Culture, respiratory (NON-Expectorated)     Status: None   Collection Time: 10/10/17  7:55 AM  Result Value Ref Range Status   Specimen Description TRACHEAL ASPIRATE  Final   Special Requests NONE  Final   Gram Stain   Final    RARE WBC PRESENT, PREDOMINANTLY MONONUCLEAR NO ORGANISMS SEEN Performed at [facility], [place] [address] [place], [place] [address]    Culture RARE CANDIDA ALBICANS  Final   Report Status 10/12/2017 FINAL  Final  Culture, bal-quantitative     Status: Abnormal   Collection Time: 10/11/17 11:11 AM  Result Value Ref Range Status   Specimen Description BRONCHIAL ALVEOLAR LAVAGE  Final   Special Requests NONE  Final   Gram Stain   Final    MODERATE WBC PRESENT,BOTH PMN AND MONONUCLEAR NO SQUAMOUS EPITHELIAL CELLS SEEN RARE BUDDING YEAST SEEN Performed at [facility], [place] [address], [place], [place] [address]    Culture 40,000 COLONIES/mL CANDIDA ALBICANS (A)  Final   Report Status 10/13/2017 FINAL  Final  Culture, respiratory (NON-Expectorated)     Status: None (Preliminary result)   Collection Time: 10/15/17  8:12 AM  Result Value Ref Range Status   Specimen Description TRACHEAL ASPIRATE  Final   Special Requests Normal  Final   Gram Stain   Final    FEW WBC PRESENT, PREDOMINANTLY PMN RARE GRAM NEGATIVE RODS Performed at [facility], [place] [address] [place], [place] [address]     Culture PENDING  Incomplete   Report Status PENDING  Incomplete   Studies/Results: Ct Abdomen Pelvis Wo Contrast  Result Date: 10/15/2017 CLINICAL DATA:  Fever of unknown origin. EXAM: CT CHEST, ABDOMEN AND PELVIS WITHOUT CONTRAST TECHNIQUE: Multidetector CT imaging of the chest, abdomen and pelvis was performed following the standard protocol without IV contrast. COMPARISON:  Chest  CT 09/14/2017 FINDINGS: CT CHEST FINDINGS Cardiovascular: The heart is globally enlarged and stable. Stable position of the Impella device. No pericardial effusion. Mediastinum/Nodes: Numerous scattered mediastinal and hilar lymph nodes. The esophagus contains a feeding tube. Lungs/Pleura: Complex right pleural fluid collection worrisome for empyema or hemothorax despite the chest tube. Complex overlying infiltrate worrisome for necrotizing pneumonia. Small left effusion and overlying infiltrate with low-attenuation also worrisome for aggressive pneumonia. Progressive finding since the prior CT scan. Musculoskeletal: No significant bony findings. CT ABDOMEN PELVIS FINDINGS Hepatobiliary: No focal hepatic lesions. The gallbladder is distended and contains layering gallstones. Pancreas: No mass, inflammation or ductal dilatation. Spleen: Grossly normal. Adrenals/Urinary Tract: Grossly normal. Stomach/Bowel: The stomach contains a feeding tube. The duodenum, small bowel and colon are grossly normal. No obstruction or mass. Vascular/Lymphatic: Stable vascular calcifications. Scattered mesenteric and retroperitoneal lymph nodes but no obvious mass or overt adenopathy. Reproductive: Foley catheter in the bladder. Prostate gland and seminal vesicles are grossly normal. Other: Mesenteric edema and small volume abdominal/pelvic ascites. There is also diffuse body wall edema suggesting anasarca. Musculoskeletal: No significant findings. IMPRESSION: 1. Worsening bilateral pulmonary infiltrates with areas of low attenuation worrisome for  necrotizing pneumonia. 2. Large right and small left pleural effusions. The right effusion is larger and is complicated by high attenuation material worrisome for empyema or pleural hemorrhage despite the chest tube. 3. Stable cardiac enlargement and stable support apparatus. 4. Diffuse mesenteric edema and small volume abdominal/pelvic ascites. There is also diffuse body wall edema suggesting anasarca. 5. Distended gallbladder with layering gallstones. No obvious acute cholecystitis. This is likely cholestasis. Right upper quadrant ultrasound may be helpful for further evaluation. Electronically Signed   By: Marijo Sanes M.D.   On: 10/15/2017 16:10   Ct Head Wo Contrast  Result Date: 10/14/2017 CLINICAL DATA:  Altered level of consciousness, patient went unresponsive, rolled eyes up, jerking movements of LEFT upper arm EXAM: CT HEAD WITHOUT CONTRAST TECHNIQUE: Contiguous axial images were obtained from the base of the skull through the vertex without intravenous contrast. Sagittal and coronal MPR images reconstructed from axial data set. COMPARISON:  None FINDINGS: Brain: Scattered motion artifacts. Normal ventricular morphology. No midline shift or mass effect. Small vessel chronic ischemic changes of deep cerebral white matter. No definite intracranial hemorrhage, mass lesion, or evidence of acute infarction within limitations of motion. No definite extra-axial fluid collections. Vascular: Atherosclerotic calcifications of internal carotid and vertebral arteries at skull base Skull: No gross abnormality identified Sinuses/Orbits: Air-fluid level in LEFT maxillary sinus Other: N/A IMPRESSION: Atrophy with small vessel chronic ischemic changes of deep cerebral white matter. No definite acute intracranial abnormalities identified on exam limited by motion. LEFT maxillary sinus air-fluid level. Electronically Signed   By: Lavonia Dana M.D.   On: 10/14/2017 16:24   Ct Chest Wo Contrast  Result Date:  10/15/2017 CLINICAL DATA:  Fever of unknown origin. EXAM: CT CHEST, ABDOMEN AND PELVIS WITHOUT CONTRAST TECHNIQUE: Multidetector CT imaging of the chest, abdomen and pelvis was performed following the standard protocol without IV contrast. COMPARISON:  Chest CT 09/15/2017 FINDINGS: CT CHEST FINDINGS Cardiovascular: The heart is globally enlarged and stable. Stable position of the Impella device. No pericardial effusion. Mediastinum/Nodes: Numerous scattered mediastinal and hilar lymph nodes. The esophagus contains a feeding tube. Lungs/Pleura: Complex right pleural fluid collection worrisome for empyema or hemothorax despite the chest tube. Complex overlying infiltrate worrisome for necrotizing pneumonia. Small left effusion and overlying infiltrate with low-attenuation also worrisome for aggressive pneumonia. Progressive finding since the prior  CT scan. Musculoskeletal: No significant bony findings. CT ABDOMEN PELVIS FINDINGS Hepatobiliary: No focal hepatic lesions. The gallbladder is distended and contains layering gallstones. Pancreas: No mass, inflammation or ductal dilatation. Spleen: Grossly normal. Adrenals/Urinary Tract: Grossly normal. Stomach/Bowel: The stomach contains a feeding tube. The duodenum, small bowel and colon are grossly normal. No obstruction or mass. Vascular/Lymphatic: Stable vascular calcifications. Scattered mesenteric and retroperitoneal lymph nodes but no obvious mass or overt adenopathy. Reproductive: Foley catheter in the bladder. Prostate gland and seminal vesicles are grossly normal. Other: Mesenteric edema and small volume abdominal/pelvic ascites. There is also diffuse body wall edema suggesting anasarca. Musculoskeletal: No significant findings. IMPRESSION: 1. Worsening bilateral pulmonary infiltrates with areas of low attenuation worrisome for necrotizing pneumonia. 2. Large right and small left pleural effusions. The right effusion is larger and is complicated by high attenuation  material worrisome for empyema or pleural hemorrhage despite the chest tube. 3. Stable cardiac enlargement and stable support apparatus. 4. Diffuse mesenteric edema and small volume abdominal/pelvic ascites. There is also diffuse body wall edema suggesting anasarca. 5. Distended gallbladder with layering gallstones. No obvious acute cholecystitis. This is likely cholestasis. Right upper quadrant ultrasound may be helpful for further evaluation. Electronically Signed   By: Marijo Sanes M.D.   On: 10/15/2017 16:10   Dg Chest Port 1 View  Result Date: 10/15/2017 CLINICAL DATA:  Hypertension. Respiratory failure. Left ventricular assist device. EXAM: PORTABLE CHEST 1 VIEW COMPARISON:  Yesterday FINDINGS: Endotracheal tube terminates 2.5 cm above carina.Swan-Ganz catheter from a right internal jugular approach is unchanged with tip in the proximal right pulmonary artery. A left-sided subclavian line terminates over the high SVC. Feeding tube extends beyond the inferior aspect of the film. Similar position of Impella device terminating over the left ventricle. Right chest tube is unchanged in position. Cardiomegaly accentuated by AP portable technique. Layering right pleural effusion and probable small left pleural effusion, similar. Decrease in less than 5% right apical pneumothorax. Similar bibasilar airspace disease. Mild pulmonary venous congestion is asymmetric, greater on the right. IMPRESSION: Right chest tube remaining in place, with decrease in less than 5% right apical pneumothorax. Otherwise, similar appearance of the chest with interstitial edema, layering pleural effusions, and bibasilar airspace disease. Electronically Signed   By: Abigail Miyamoto M.D.   On: 10/15/2017 07:43   Dg Chest Port 1 View  Result Date: 10/14/2017 CLINICAL DATA:  Difficulty breathing EXAM: PORTABLE CHEST 1 VIEW COMPARISON:  10/14/2017 FINDINGS: Endotracheal tube has been placed in good position. Swan-Ganz catheter right pulmonary  artery. Left subclavian central venous catheter tip in the mid SVC. Impella device in the left ventricle unchanged. Right chest tube in place. Probable small right apical pneumothorax. Moderate right effusion unchanged. Right lower lobe atelectasis/infiltrate unchanged. Left lower lobe airspace disease unchanged. IMPRESSION: Interval intubation with endotracheal tube in good position Probable small right apical pneumothorax Bibasilar airspace disease and bilateral effusions unchanged. Electronically Signed   By: Franchot Gallo M.D.   On: 10/14/2017 15:08   Assessment/Plan:  INTERVAL HISTORY:  - Family states patient was more responsive yesterday evening and per cardiology note the patient was awake this AM. Not awake for my exam  - Continues to be on [place]   - Echocardiogram showing possible vegetation on the chordae  - CT chest/abdomen/pelvis illustrating findings consistent with bilateral necrotizing pneumonia but no other source of infection.  - Repeat blood, urine, and sputum cultures obtained yesterday  Active Problems:   Atrial fibrillation with RVR ([place])   Acute  on chronic systolic heart failure ([place])   Encounter for central line care   Shock circulatory ([place])   Malnutrition of moderate degree   Cardiogenic shock (HCC)   LVAD (left ventricular assist device) present ([place])   HCAP (healthcare-associated pneumonia)   Pressure injury of skin   AKI (acute kidney injury) ([place])   Central line infiltration (HCC)   CHF (congestive heart failure) ([place])   Community acquired pneumonia of right lower lobe of lung ([place])   Hemothorax on right   Hx of chest tube placement   FUO (fever of unknown origin)   Pneumococcal bacteremia   Focal seizure (HCC)   Unresponsiveness   Seizure ([place])   Acute bacterial endocarditis  Bryan Harmon a 67 y.o.maleHFrEF and Atriat fibrillation who presented on 1/21in A-fib with RVR and low output heart failure. He has subsequently has had a  complicated hospitalization with placement of anImpellaand persistent fevers since 1/30. Echocardiogram on 2/5 illustrated a possible vegetation and antibiotics were transitioned to Teflaro and metronidazole. CT chest/abdomen/pelvis did illustrate findings consistent with bilateral necrotizing pneumonia. Common organisms isolated in necrotizing pneumonia include anaerobes, staph, and klebsiella. All of which should be adequately covered with current antibiotic therapy.   - Continue Teflaro and Metronidazole  - Follow blood and sputum cultures, will call micro this afternoon for preliminary read    LOS: 16 days   [facility] 10/16/2017, 8:26 AM

## 2017-10-16 NOTE — Progress Notes (Signed)
Patient with no further episodes.  He continues to be awake, highly interactive, following commands in all 4 extremity's.  Focal seizure versus hypertensive episode versus focal seizure secondary to hypoperfusion.  At this time, I am not at all certain that he needs prolonged antiepileptic therapy, but there was a question on his EEG of possible interictal discharges, though my suspicion is that they are artifactual.  I would favor repeating an EEG, if it does not reveal any epileptiform discharges I will discontinue Keppra at this time.  Ritta Slot, MD [facility] [phone]  If 7pm- 7am, please page neurology on call as listed in AMION.

## 2017-10-17 ENCOUNTER — Inpatient Hospital Stay (HOSPITAL_COMMUNITY): Payer: Medicare Other

## 2017-10-17 DIAGNOSIS — R58 Hemorrhage, not elsewhere classified: Secondary | ICD-10-CM

## 2017-10-17 DIAGNOSIS — R238 Other skin changes: Secondary | ICD-10-CM

## 2017-10-17 DIAGNOSIS — Z978 Presence of other specified devices: Secondary | ICD-10-CM

## 2017-10-17 LAB — POCT I-STAT, CHEM 8
BUN: 77 mg/dL — ABNORMAL HIGH (ref 6–20)
Calcium, Ion: 1.09 mmol/L — ABNORMAL LOW (ref 1.15–1.40)
Chloride: 104 mmol/L (ref 101–111)
Creatinine, Ser: 2.6 mg/dL — ABNORMAL HIGH (ref 0.61–1.24)
GLUCOSE: 144 mg/dL — AB (ref 65–99)
HCT: 25 % — ABNORMAL LOW (ref 39.0–52.0)
HEMOGLOBIN: 8.5 g/dL — AB (ref 13.0–17.0)
POTASSIUM: 3.7 mmol/L (ref 3.5–5.1)
SODIUM: 141 mmol/L (ref 135–145)
TCO2: 23 mmol/L (ref 22–32)

## 2017-10-17 LAB — CBC WITH DIFFERENTIAL/PLATELET
BASOS PCT: 0 %
Basophils Absolute: 0 10*3/uL (ref 0.0–0.1)
EOS ABS: 0 10*3/uL (ref 0.0–0.7)
EOS PCT: 0 %
HEMATOCRIT: 24.3 % — AB (ref 39.0–52.0)
Hemoglobin: 8.2 g/dL — ABNORMAL LOW (ref 13.0–17.0)
LYMPHS PCT: 7 %
Lymphs Abs: 1.2 10*3/uL (ref 0.7–4.0)
MCH: 30.3 pg (ref 26.0–34.0)
MCHC: 33.7 g/dL (ref 30.0–36.0)
MCV: 89.7 fL (ref 78.0–100.0)
Monocytes Absolute: 0.7 10*3/uL (ref 0.1–1.0)
Monocytes Relative: 4 %
NEUTROS PCT: 89 %
Neutro Abs: 15.4 10*3/uL — ABNORMAL HIGH (ref 1.7–7.7)
PLATELETS: 115 10*3/uL — AB (ref 150–400)
RBC: 2.71 MIL/uL — ABNORMAL LOW (ref 4.22–5.81)
RDW: 16.2 % — ABNORMAL HIGH (ref 11.5–15.5)
WBC: 17.3 10*3/uL — ABNORMAL HIGH (ref 4.0–10.5)

## 2017-10-17 LAB — BASIC METABOLIC PANEL
Anion gap: 12 (ref 5–15)
BUN: 94 mg/dL — ABNORMAL HIGH (ref 6–20)
CO2: 23 mmol/L (ref 22–32)
Calcium: 7.4 mg/dL — ABNORMAL LOW (ref 8.9–10.3)
Chloride: 104 mmol/L (ref 101–111)
Creatinine, Ser: 2.71 mg/dL — ABNORMAL HIGH (ref 0.61–1.24)
GFR calc Af Amer: 27 mL/min — ABNORMAL LOW (ref >60)
GFR calc non Af Amer: 23 mL/min — ABNORMAL LOW (ref >60)
Glucose, Bld: 116 mg/dL — ABNORMAL HIGH (ref 65–99)
Potassium: 3.6 mmol/L (ref 3.5–5.1)
Sodium: 139 mmol/L (ref 135–145)

## 2017-10-17 LAB — BLOOD GAS, ARTERIAL
Acid-Base Excess: 1.7 mmol/L (ref 0.0–2.0)
Bicarbonate: 25.2 mmol/L (ref 20.0–28.0)
FIO2: 40
MECHVT: 540 mL
O2 Saturation: 98.2 %
PEEP: 5 cmH2O
Patient temperature: 98.6
RATE: 22 resp/min
pCO2 arterial: 35.5 mmHg (ref 32.0–48.0)
pH, Arterial: 7.465 — ABNORMAL HIGH (ref 7.350–7.450)
pO2, Arterial: 101 mmHg (ref 83.0–108.0)

## 2017-10-17 LAB — APTT: aPTT: 72 seconds — ABNORMAL HIGH (ref 24–36)

## 2017-10-17 LAB — GLUCOSE, CAPILLARY
GLUCOSE-CAPILLARY: 106 mg/dL — AB (ref 65–99)
GLUCOSE-CAPILLARY: 119 mg/dL — AB (ref 65–99)
GLUCOSE-CAPILLARY: 139 mg/dL — AB (ref 65–99)
GLUCOSE-CAPILLARY: 143 mg/dL — AB (ref 65–99)
Glucose-Capillary: 124 mg/dL — ABNORMAL HIGH (ref 65–99)
Glucose-Capillary: 79 mg/dL (ref 65–99)

## 2017-10-17 LAB — MAGNESIUM: Magnesium: 2.3 mg/dL (ref 1.7–2.4)

## 2017-10-17 LAB — HEPARIN LEVEL (UNFRACTIONATED)
Heparin Unfractionated: 0.16 IU/mL — ABNORMAL LOW (ref 0.30–0.70)
Heparin Unfractionated: 0.1 IU/mL — ABNORMAL LOW (ref 0.30–0.70)
Heparin Unfractionated: 0.22 IU/mL — ABNORMAL LOW (ref 0.30–0.70)

## 2017-10-17 LAB — CALCIUM, IONIZED: Calcium, Ionized, Serum: 4.1 mg/dL — ABNORMAL LOW (ref 4.5–5.6)

## 2017-10-17 LAB — COOXEMETRY PANEL
Carboxyhemoglobin: 1.4 % (ref 0.5–1.5)
Carboxyhemoglobin: 1.5 % (ref 0.5–1.5)
Methemoglobin: 1 % (ref 0.0–1.5)
Methemoglobin: 1.4 % (ref 0.0–1.5)
O2 Saturation: 51.3 %
O2 Saturation: 59.1 %
Total hemoglobin: 7.9 g/dL — ABNORMAL LOW (ref 12.0–16.0)
Total hemoglobin: 8.4 g/dL — ABNORMAL LOW (ref 12.0–16.0)

## 2017-10-17 LAB — LACTATE DEHYDROGENASE: LDH: 674 U/L — ABNORMAL HIGH (ref 98–192)

## 2017-10-17 LAB — PHOSPHORUS: Phosphorus: 4.4 mg/dL (ref 2.5–4.6)

## 2017-10-17 MED ORDER — HEPARIN (PORCINE) IN NACL 100-0.45 UNIT/ML-% IJ SOLN
1750.0000 [IU]/h | INTRAMUSCULAR | Status: DC
  Administered 2017-10-17: 2000 [IU]/h via INTRAVENOUS
  Administered 2017-10-17: 1850 [IU]/h via INTRAVENOUS
  Administered 2017-10-18 – 2017-10-20 (×4): 2150 [IU]/h via INTRAVENOUS
  Administered 2017-10-21: 1850 [IU]/h via INTRAVENOUS
  Administered 2017-10-21: 2050 [IU]/h via INTRAVENOUS
  Administered 2017-10-23 (×2): 1700 [IU]/h via INTRAVENOUS
  Administered 2017-10-25: 1750 [IU]/h via INTRAVENOUS
  Administered 2017-10-25: 1650 [IU]/h via INTRAVENOUS
  Administered 2017-10-26 – 2017-10-27 (×3): 1750 [IU]/h via INTRAVENOUS
  Filled 2017-10-17 (×17): qty 250

## 2017-10-17 MED ORDER — POTASSIUM CHLORIDE 20 MEQ/15ML (10%) PO SOLN
40.0000 meq | Freq: Once | ORAL | Status: AC
  Administered 2017-10-17: 40 meq
  Filled 2017-10-17: qty 30

## 2017-10-17 MED ORDER — CHLORHEXIDINE GLUCONATE 0.12 % MT SOLN
OROMUCOSAL | Status: AC
  Administered 2017-10-17: 15 mL via OROMUCOSAL
  Filled 2017-10-17: qty 15

## 2017-10-17 NOTE — Progress Notes (Signed)
Pt administered 100 mcg of fentanyl for pain at this time. Pt administered same amount 2/4 by same RN. Pt eyes rolled in back of head both administrations. RN will d/c medication and notify MD of reaction.

## 2017-10-17 NOTE — Progress Notes (Signed)
ANTICOAGULATION CONSULT NOTE - Follow Up Consult  Pharmacy Consult for Heparin Indication: atrial fibrillation and LA thrombus  Allergies  Allergen Reactions  . Fentanyl And Related Other (See Comments)    Patients eyes roll in the back of his head and patient has seizure like activity    Patient Measurements: Height: 5\' 10"  (177.8 cm) Weight: 215 lb (97.5 kg)(Different bed from previous weight) IBW/kg (Calculated) : 73   Vital Signs: Temp: 99.3 F (37.4 C) (02/07 1500) BP: 90/65 (02/07 1500) Pulse Rate: 136 (02/07 1500)  Labs: Recent Labs    10/15/17 0302  10/16/17 0404 10/16/17 1550 10/16/17 2240 10/17/17 0454 10/17/17 0756 10/17/17 1436 10/17/17 1622  HGB 7.8*   < > 7.8* 8.5*  --  8.2*  --   --  8.5*  HCT 23.6*   < > 23.0* 25.0*  --  24.3*  --   --  25.0*  PLT 75*  --  102*  --   --  115*  --   --   --   APTT 105*  --  82*  --   --  72*  --   --   --   HEPARINUNFRC 0.33  --  0.23*  --  0.16*  --  0.16* 0.10*  --   CREATININE 2.53*   < > 2.66* 2.80*  --  2.71*  --   --  2.60*   < > = values in this interval not displayed.    Estimated Creatinine Clearance: 33.2 mL/min (A) (by C-G formula based on SCr of 2.6 mg/dL (H)).  Medications: Heparin @ 1700 units/hr  Assessment: 66yom known to pharmacy for heparin management with impella. Impella removed 2/6 due to stoppage from small vegetation that got sucked into the catheter. Heparin was resumed ~ 4 hours after removal for afib and LA thrombus.   Heparin level came back subtherapeutic on 0.1, on 1850 units/hr. Hgb stable, platelets improving. Discussed with nursing and no bleeding outside of bloody chest tube drainage.   Goal of Therapy:  Heparin level 0.3 units/ml Monitor platelets by anticoagulation protocol: Yes   Plan:  1) Increase heparin to 2000 units/hr 2) Check 6 hour heparin level 3) Daily heparin level and CBC  Girard Cooter, PharmD Clinical Pharmacist  Pager: [phone] Clinical Phone for  10/17/2017 until 3:30pm: [phone] If after 3:30pm, please call main pharmacy at [phone]  10/17/2017 4:39 PM

## 2017-10-17 NOTE — Progress Notes (Signed)
ANTICOAGULATION CONSULT NOTE - Follow Up Consult  Pharmacy Consult for heparin Indication: atrial fibrillation and LA thrombus  Labs: Recent Labs    10/14/17 0239  10/14/17 1743 10/15/17 0302  10/15/17 1512 10/16/17 0404 10/16/17 1550 10/16/17 2240  HGB  --    < > 8.4* 7.8*   < > 8.2* 7.8* 8.5*  --   HCT  --    < > 25.0* 23.6*   < > 24.0* 23.0* 25.0*  --   PLT  --    < > 81* 75*  --   --  102*  --   --   APTT 115*  --   --  105*  --   --  82*  --   --   HEPARINUNFRC 0.57  --   --  0.33  --   --  0.23*  --  0.16*  CREATININE  --    < >  --  2.53*   < > 2.50* 2.66* 2.80*  --    < > = values in this interval not displayed.    Assessment: 66yo male subtherapeutic on heparin after resumed post-procedure.  Goal of Therapy:  Heparin level ~0.3 units/ml   Plan:  Will increase heparin gtt slightly to 1700 units/hr and check level in 8 hours.   Vernard Gambles, PharmD, BCPS  10/17/2017,12:03 AM

## 2017-10-17 NOTE — Progress Notes (Signed)
ANTICOAGULATION CONSULT NOTE  Pharmacy Consult for Heparin Indication: atrial fibrillation and LA thrombus  Allergies  Allergen Reactions  . Fentanyl And Related Other (See Comments)    Patients eyes roll in the back of his head and patient has seizure like activity    Patient Measurements: Height: 5\' 10"  (177.8 cm) Weight: 215 lb (97.5 kg)(Different bed from previous weight) IBW/kg (Calculated) : 73   Vital Signs: Temp: 99.5 F (37.5 C) (02/07 2000) BP: 82/62 (02/07 2000) Pulse Rate: 126 (02/07 2006)  Labs: Recent Labs    10/15/17 0302  10/16/17 0404 10/16/17 1550  10/17/17 0454 10/17/17 0756 10/17/17 1436 10/17/17 1622 10/17/17 2318  HGB 7.8*   < > 7.8* 8.5*  --  8.2*  --   --  8.5*  --   HCT 23.6*   < > 23.0* 25.0*  --  24.3*  --   --  25.0*  --   PLT 75*  --  102*  --   --  115*  --   --   --   --   APTT 105*  --  82*  --   --  72*  --   --   --   --   HEPARINUNFRC 0.33  --  0.23*  --    < >  --  0.16* 0.10*  --  0.22*  CREATININE 2.53*   < > 2.66* 2.80*  --  2.71*  --   --  2.60*  --    < > = values in this interval not displayed.      Assessment: 66yom known to pharmacy for heparin management with impella. Impella removed 2/6 due to stoppage from small vegetation that got sucked into the catheter. Heparin was resumed ~ 4 hours after removal for afib and LA thrombus.   Heparin level is subtherapeutic. No increased bleeding from tube.  Goal of Therapy:  Heparin level 0.3 units/ml Monitor platelets by anticoagulation protocol: Yes    Plan:  -Increase heparin to 2150 units/hr -Daily HL, CBC -Check confirmatory level in the morning   Agapito Games, PharmD, BCPS Clinical Pharmacist 10/17/2017 11:56 PM

## 2017-10-17 NOTE — Op Note (Signed)
NAME:  Bryan Harmon, Bryan Harmon               ACCOUNT NO.:  MEDICAL RECORD NO.:  [id number]  LOCATION:                                 FACILITY:  PHYSICIAN:  Kerin Perna, M.D.  DATE OF BIRTH:  09/30/1950  DATE OF PROCEDURE:  10/16/2017 DATE OF DISCHARGE:                              OPERATIVE REPORT   OPERATION:  Removal of Impella 5.0 left ventricular assist device.  SURGEON:  Kerin Perna, MD.  ASSISTANT:  Rowe Clack, P.A.-C.  ANESTHESIA:  Lidocaine 1% and IV conscious monitored sedation.  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  CLINICAL NOTE:  The patient is a 67 year old male [facility] by Dr. [name] for 1-2 years, who was recently admitted in extremis with low output cardiogenic shock. Approximately 1 week ago after failing to respond to conventional therapy, he underwent placement of a percutaneous left ventricular assist device via a graft anastomosed to the right axillary artery. Postoperatively, he has had good flows of 4-4.5 L/minute; however, his end-organ dysfunction has not shown significant improvement.  He still remains on the ventilator.  His creatinine remains elevated at 2.7 and he has had a fever to 102-104.  Earlier today, his ventricular assist device stopped functioning normally and his cardiologist wished to have the catheter removed.  I discussed the procedure at the bedside in the ICU with the patient's son and he understood the procedure of removal of the temporary percutaneous left ventricular assist device.  The patient was premedicated with IV fentanyl and Versed.  The right chest was prepped and draped.  Lidocaine 1% was infiltrated in the previous incision.  The previous sutures anchoring the introducer were removed.  The incision was opened.  There was  some mild clotted blood. The catheter was then removed from the heart into the innominate artery and the Impella was turned off.  The catheter was then pulled all the way through the graft and the graft was clamped.  There was some thrombus in the outflow portion of the graft, which was cleaned out and the graft was irrigated.  The clamp on the graft was then briefly flashed to allow blood to exit the graft and clear any residual thrombus.  The graft was then ligated and divided with an Endo-GIA stapler.  The pocket was irrigated with sterile saline.  It was then closed in layers using Vicryl as well as skin staples.  A sterile dressing was applied.  The patient tolerated the procedure well and his hemodynamics remained remarkably stable.     Kerin Perna, M.D.     PV/MEDQ  D:  10/16/2017  T:  10/17/2017  Job:  [id number]

## 2017-10-17 NOTE — Progress Notes (Signed)
Subjective: No new complaints Family at bedside  Patient much more alert today, shaking his head to answer questions   Antibiotics:  Anti-infectives (From admission, onward)   Start     Dose/Rate Route Frequency Ordered Stop   10/15/17 1200  ceftaroline (TEFLARO) 400 mg in sodium chloride 0.9 % 250 mL IVPB     400 mg 250 mL/hr over 60 Minutes Intravenous Every 12 hours 10/15/17 1052     10/15/17 1100  metroNIDAZOLE (FLAGYL) IVPB 500 mg     500 mg 100 mL/hr over 60 Minutes Intravenous Every 8 hours 10/15/17 1052     10/13/17 1200  anidulafungin (ERAXIS) 100 mg in sodium chloride 0.9 % 100 mL IVPB  Status:  Discontinued     100 mg 78 mL/hr over 100 Minutes Intravenous Every 24 hours 10/12/17 1123 10/14/17 1238   10/12/17 1200  anidulafungin (ERAXIS) 200 mg in sodium chloride 0.9 % 200 mL IVPB     200 mg 78 mL/hr over 200 Minutes Intravenous  Once 10/12/17 1123 10/12/17 1702   09/12/2017 2000  vancomycin (VANCOCIN) IVPB 1000 mg/200 mL premix  Status:  Discontinued     1,000 mg 200 mL/hr over 60 Minutes Intravenous Every 24 hours 09/23/2017 1754 10/14/17 0951   09/10/2017 1800  meropenem (MERREM) 1 g in sodium chloride 0.9 % 100 mL IVPB  Status:  Discontinued     1 g 200 mL/hr over 30 Minutes Intravenous Every 12 hours 09/16/2017 1720 10/15/17 1052   10/06/2017 0912  vancomycin (VANCOCIN) 1,000 mg in sodium chloride 0.9 % 1,000 mL irrigation  Status:  Discontinued       As needed 09/26/2017 0912 10/03/2017 1115   09/26/2017 0700  fluconazole (DIFLUCAN) IVPB 400 mg  Status:  Discontinued     400 mg 100 mL/hr over 120 Minutes Intravenous To Surgery 09/24/2017 0650 09/21/2017 1115   10/06/2017 0700  rifampin (RIFADIN) 600 mg in sodium chloride 0.9 % 100 mL IVPB  Status:  Discontinued     600 mg 200 mL/hr over 30 Minutes Intravenous To Surgery 09/23/2017 0650 09/22/2017 1115   09/10/2017 0700  vancomycin (VANCOCIN) powder 1,000 mg  Status:  Discontinued     1,000 mg Other To Surgery 09/10/2017 0650  09/27/2017 1115   09/11/2017 0400  vancomycin (VANCOCIN) 1,250 mg in sodium chloride 0.9 % 250 mL IVPB     1,250 mg 166.7 mL/hr over 90 Minutes Intravenous To Surgery 09/11/2017 1910 09/19/2017 1222   09/25/2017 0400  cefUROXime (ZINACEF) 1.5 g in dextrose 5 % 50 mL IVPB     1.5 g 100 mL/hr over 30 Minutes Intravenous To Surgery 10/01/2017 1910 10/06/2017 1202   09/16/2017 0400  cefUROXime (ZINACEF) 750 mg in dextrose 5 % 50 mL IVPB  Status:  Discontinued     750 mg 100 mL/hr over 30 Minutes Intravenous To Surgery 10/06/2017 1910 09/10/2017 1115   10/01/17 0600  cefTRIAXone (ROCEPHIN) 2 g in dextrose 5 % 50 mL IVPB  Status:  Discontinued     2 g 100 mL/hr over 30 Minutes Intravenous Every 24 hours 10/01/17 0541 09/26/2017 1705   09/18/2017 1315  doxycycline (VIBRA-TABS) tablet 100 mg  Status:  Discontinued     100 mg Oral Every 12 hours 09/29/2017 1301 10/02/17 1346   09/12/2017 1315  cefTRIAXone (ROCEPHIN) 1 g in dextrose 5 % 50 mL IVPB  Status:  Discontinued     1 g 100 mL/hr over 30 Minutes Intravenous Every 24  hours 09/16/2017 1301 10/01/17 0541     Medications: Scheduled Meds: . chlorhexidine gluconate (MEDLINE KIT)  15 mL Mouth Rinse BID  . Chlorhexidine Gluconate Cloth  6 each Topical Daily  . feeding supplement (PRO-STAT SUGAR FREE 64)  30 mL Per Tube TID  . Gerhardt's butt cream   Topical BID  . insulin aspart  2-6 Units Subcutaneous Q4H  . insulin glargine  12 Units Subcutaneous BID  . mouth rinse  15 mL Mouth Rinse 10 times per day  . sennosides  5 mL Oral Daily  . sodium chloride flush  3 mL Intravenous Q12H   Continuous Infusions: . sodium chloride 20 mL/hr at 10/15/17 0608  . amiodarone 30 mg/hr (10/17/17 0800)  . ceFTAROline (TEFLARO) IV Stopped (10/17/17 0115)  . dexmedetomidine (PRECEDEX) IV infusion for high rates 1.199 mcg/kg/hr (10/17/17 0800)  . famotidine (PEPCID) IV Stopped (10/16/17 0957)  . feeding supplement (VITAL 1.5 CAL) 1,000 mL (10/17/17 0800)  . heparin 1,700 Units/hr  (10/17/17 0800)  . metronidazole Stopped (10/17/17 5176)  . milrinone 0.5 mcg/kg/min (10/17/17 0800)  . norepinephrine (LEVOPHED) Adult infusion 12.053 mcg/min (10/17/17 0800)   PRN Meds:.sodium chloride, acetaminophen (TYLENOL) oral liquid 160 mg/5 mL, albuterol, fentaNYL (SUBLIMAZE) injection, fentaNYL (SUBLIMAZE) injection, magic mouthwash, midazolam, morphine injection, ondansetron (ZOFRAN) IV, sodium chloride flush, sodium chloride flush  Objective: Weight change: 14 lb 15.6 oz (6.793 kg)  Intake/Output Summary (Last 24 hours) at 10/17/2017 0836 Last data filed at 10/17/2017 0800 Gross per 24 hour  Intake 5641.31 ml  Output 4295 ml  Net 1346.31 ml   Blood pressure (!) 88/62, pulse 66, temperature 98.2 F (36.8 C), resp. rate (!) 33, height _0  (1.778 m), weight 215 lb (97.5 kg), SpO2 97 %. Temp:  [97 F (36.1 C)-99.3 F (37.4 C)] 98.2 F (36.8 C) (02/07 0800) Pulse Rate:  [25-135] 66 (02/07 0800) Resp:  [11-47] 33 (02/07 0800) BP: (67-121)/(19-87) 88/62 (02/07 0800) SpO2:  [54 %-100 %] 97 % (02/07 0800) Arterial Line BP: (83-132)/(49-72) 99/57 (02/07 0800) FiO2 (%):  [40 %] 40 % (02/07 0347) Weight:  [215 lb (97.5 kg)] 215 lb (97.5 kg) (02/07 0400)  Physical Exam: General: Alert and awake, not in any acute distress. HEENT: Anicteric sclera, pupils reactive to light and accommodation, EOMI CVS: Tachycardic, normal r, no murmur rubs or gallops Chest: On vent, crackles on the right with chest tube in place. Good air movement on the left  Abdomen: Soft nontender, nondistended, normal bowel sounds, Extremities: Ecchymosis on the left wrist, nonpitting edema of the upper extremities, mild pitting edema of the lower extremities Skin: Fine, erythematous papules on the right upper arm  Lymph: No new lymphadenopathy  CBC: _1 (wbc3,Hgb:3,Hct:3,Plt:3,INR:3APTT:3)@  BMET Recent Labs    10/16/17 0404 10/16/17 1550 10/17/17 0454  NA 137 137 139  K 4.2 4.1 3.6  CL 102  100* 104  CO2 23  --  23  GLUCOSE 127* 146* 116*  BUN 96* 89* 94*  CREATININE 2.66* 2.80* 2.71*  CALCIUM 7.3*  --  7.4*   Liver Panel  Recent Labs    10/15/17 1029  PROT 3.6*  ALBUMIN 1.6*  AST 68*  ALT 44  ALKPHOS 75  BILITOT 1.8*  BILIDIR 0.5  IBILI 1.3*   Sedimentation Rate No results for input(s): ESRSEDRATE in the last 72 hours. C-Reactive Protein No results for input(s): CRP in the last 72 hours.  Micro Results: Recent Results (from the past 720 hour(s))  Culture, blood (Routine X 2) w Reflex  to ID Panel     Status: Abnormal   Collection Time: 09/10/2017  1:08 PM  Result Value Ref Range Status   Specimen Description BLOOD RIGHT HAND  Final   Special Requests   Final    BOTTLES DRAWN AEROBIC AND ANAEROBIC Blood Culture adequate volume   Culture  Setup Time   Final    GRAM POSITIVE COCCI AEROBIC BOTTLE ONLY CRITICAL RESULT CALLED TO, READ BACK BY AND VERIFIED WITH: J LEDFORD PHARMD 0532 10/01/17 A BROWNING    Culture STREPTOCOCCUS PNEUMONIAE (A)  Final   Report Status 10/03/2017 FINAL  Final   Organism ID, Bacteria STREPTOCOCCUS PNEUMONIAE  Final      Susceptibility   Streptococcus pneumoniae - MIC*    ERYTHROMYCIN <=0.12 SENSITIVE Sensitive     LEVOFLOXACIN 0.5 SENSITIVE Sensitive     PENICILLIN (non-meningitis) 0.25 SENSITIVE Sensitive     CEFTRIAXONE (non-meningitis) <=0.12 SENSITIVE Sensitive     * STREPTOCOCCUS PNEUMONIAE  Blood Culture ID Panel (Reflexed)     Status: Abnormal   Collection Time: 09/15/2017  1:08 PM  Result Value Ref Range Status   Enterococcus species NOT DETECTED NOT DETECTED Final   Listeria monocytogenes NOT DETECTED NOT DETECTED Final   Staphylococcus species NOT DETECTED NOT DETECTED Final   Staphylococcus aureus NOT DETECTED NOT DETECTED Final   Streptococcus species DETECTED (A) NOT DETECTED Final    Comment: CRITICAL RESULT CALLED TO, READ BACK BY AND VERIFIED WITH: J LEDFORD PHARMD 0532 10/01/17 A BROWNING    Streptococcus  agalactiae NOT DETECTED NOT DETECTED Final   Streptococcus pneumoniae DETECTED (A) NOT DETECTED Final    Comment: CRITICAL RESULT CALLED TO, READ BACK BY AND VERIFIED WITH: J LEDFORD PHARMD 0532 10/01/17 A BROWNING    Streptococcus pyogenes NOT DETECTED NOT DETECTED Final   Acinetobacter baumannii NOT DETECTED NOT DETECTED Final   Enterobacteriaceae species NOT DETECTED NOT DETECTED Final   Enterobacter cloacae complex NOT DETECTED NOT DETECTED Final   Escherichia coli NOT DETECTED NOT DETECTED Final   Klebsiella oxytoca NOT DETECTED NOT DETECTED Final   Klebsiella pneumoniae NOT DETECTED NOT DETECTED Final   Proteus species NOT DETECTED NOT DETECTED Final   Serratia marcescens NOT DETECTED NOT DETECTED Final   Haemophilus influenzae NOT DETECTED NOT DETECTED Final   Neisseria meningitidis NOT DETECTED NOT DETECTED Final   Pseudomonas aeruginosa NOT DETECTED NOT DETECTED Final   Candida albicans NOT DETECTED NOT DETECTED Final   Candida glabrata NOT DETECTED NOT DETECTED Final   Candida krusei NOT DETECTED NOT DETECTED Final   Candida parapsilosis NOT DETECTED NOT DETECTED Final   Candida tropicalis NOT DETECTED NOT DETECTED Final  Culture, blood (Routine X 2) w Reflex to ID Panel     Status: None   Collection Time: 10/06/2017  1:11 PM  Result Value Ref Range Status   Specimen Description BLOOD LEFT FOREARM  Final   Special Requests IN PEDIATRIC BOTTLE Blood Culture adequate volume  Final   Culture NO GROWTH 5 DAYS  Final   Report Status 10/05/2017 FINAL  Final  Respiratory Panel by PCR     Status: Abnormal   Collection Time: 09/12/2017  1:51 PM  Result Value Ref Range Status   Adenovirus NOT DETECTED NOT DETECTED Final   Coronavirus 229E NOT DETECTED NOT DETECTED Final   Coronavirus HKU1 NOT DETECTED NOT DETECTED Final   Coronavirus NL63 NOT DETECTED NOT DETECTED Final   Coronavirus OC43 NOT DETECTED NOT DETECTED Final   Metapneumovirus NOT DETECTED NOT DETECTED Final  Rhinovirus /  Enterovirus NOT DETECTED NOT DETECTED Final   Influenza A NOT DETECTED NOT DETECTED Final   Influenza B NOT DETECTED NOT DETECTED Final   Parainfluenza Virus 1 NOT DETECTED NOT DETECTED Final   Parainfluenza Virus 2 NOT DETECTED NOT DETECTED Final   Parainfluenza Virus 3 DETECTED (A) NOT DETECTED Final   Parainfluenza Virus 4 NOT DETECTED NOT DETECTED Final   Respiratory Syncytial Virus NOT DETECTED NOT DETECTED Final   Bordetella pertussis NOT DETECTED NOT DETECTED Final   Chlamydophila pneumoniae NOT DETECTED NOT DETECTED Final   Mycoplasma pneumoniae NOT DETECTED NOT DETECTED Final  Culture, blood (routine x 2)     Status: None   Collection Time: 10/10/2017  4:45 PM  Result Value Ref Range Status   Specimen Description BLOOD LEFT HAND  Final   Special Requests IN PEDIATRIC BOTTLE Blood Culture adequate volume  Final   Culture NO GROWTH 5 DAYS  Final   Report Status 10/05/2017 FINAL  Final  MRSA PCR Screening     Status: None   Collection Time: 09/15/2017  4:49 PM  Result Value Ref Range Status   MRSA by PCR NEGATIVE NEGATIVE Final    Comment:        The GeneXpert MRSA Assay (FDA approved for NASAL specimens only), is one component of a comprehensive MRSA colonization surveillance program. It is not intended to diagnose MRSA infection nor to guide or monitor treatment for MRSA infections.   Culture, blood (routine x 2)     Status: None   Collection Time: 10/04/2017  5:06 PM  Result Value Ref Range Status   Specimen Description BLOOD RIGHT HAND  Final   Special Requests IN PEDIATRIC BOTTLE Blood Culture adequate volume  Final   Culture NO GROWTH 5 DAYS  Final   Report Status 10/05/2017 FINAL  Final  Culture, blood (Routine X 2) w Reflex to ID Panel     Status: None   Collection Time: 10/06/2017  4:18 PM  Result Value Ref Range Status   Specimen Description BLOOD RIGHT ANTECUBITAL  Final   Special Requests IN PEDIATRIC BOTTLE Blood Culture adequate volume  Final   Culture   Final     NO GROWTH 5 DAYS Performed at [facility], [place] [address] [place], [place] [address]    Report Status 10/12/2017 FINAL  Final  Culture, blood (Routine X 2) w Reflex to ID Panel     Status: None   Collection Time: 10/06/2017  4:20 PM  Result Value Ref Range Status   Specimen Description BLOOD RIGHT ANTECUBITAL  Final   Special Requests IN PEDIATRIC BOTTLE Blood Culture adequate volume  Final   Culture   Final    NO GROWTH 5 DAYS Performed at [facility], [place] [address] [place], [place] [address]    Report Status 10/12/2017 FINAL  Final  Surgical pcr screen     Status: Abnormal   Collection Time: 09/21/2017  8:37 PM  Result Value Ref Range Status   MRSA, PCR RESULT CALLED TO, READ BACK BY AND VERIFIED WITH: (A) NEGATIVE Final    Comment:  M AMO RN 10/01/2017 0504 JDW   Staphylococcus aureus INVALID RESULTS, SPECIMEN SENT FOR CULTURE (A) NEGATIVE Final    Comment: Results Called to:  M [facility] 10/03/2017 0504 JDW (NOTE) The Xpert SA Assay (FDA approved for NASAL specimens in patients 30 years of age and older), is one component of a comprehensive surveillance program. It is not intended to  diagnose infection nor to guide or monitor treatment.   MRSA culture     Status: None   Collection Time: 09/17/2017  8:37 PM  Result Value Ref Range Status   Specimen Description NASOPHARYNGEAL  Final   Special Requests NONE  Final   Culture NO MRSA DETECTED  Final   Report Status 10/10/2017 FINAL  Final  Culture, blood (routine x 2)     Status: None   Collection Time: 09/24/2017  5:52 PM  Result Value Ref Range Status   Specimen Description BLOOD RIGHT ANTECUBITAL  Final   Special Requests IN PEDIATRIC BOTTLE Blood Culture adequate volume  Final   Culture   Final    NO GROWTH 5 DAYS Performed at [facility], [place] [address], [place], [place] [address]    Report Status 10/14/2017 FINAL  Final  Culture, blood (routine x 2)     Status: None   Collection Time: 10/02/2017  6:00 PM    Result Value Ref Range Status   Specimen Description BLOOD RIGHT HAND  Final   Special Requests IN PEDIATRIC BOTTLE Blood Culture adequate volume  Final   Culture   Final    NO GROWTH 5 DAYS Performed at [facility], [place] [address] [place], [place] [address]    Report Status 10/14/2017 FINAL  Final  Culture, respiratory (NON-Expectorated)     Status: None   Collection Time: 10/10/17  7:55 AM  Result Value Ref Range Status   Specimen Description TRACHEAL ASPIRATE  Final   Special Requests NONE  Final   Gram Stain   Final    RARE WBC PRESENT, PREDOMINANTLY MONONUCLEAR NO ORGANISMS SEEN Performed at [facility], [place] [address], [place], [place] [address]    Culture RARE CANDIDA ALBICANS  Final   Report Status 10/12/2017 FINAL  Final  Culture, bal-quantitative     Status: Abnormal   Collection Time: 10/11/17 11:11 AM  Result Value Ref Range Status   Specimen Description BRONCHIAL ALVEOLAR LAVAGE  Final   Special Requests NONE  Final   Gram Stain   Final    MODERATE WBC PRESENT,BOTH PMN AND MONONUCLEAR NO SQUAMOUS EPITHELIAL CELLS SEEN RARE BUDDING YEAST SEEN Performed at [facility], [place] [address] [place], [place] [address]    Culture 40,000 COLONIES/mL CANDIDA ALBICANS (A)  Final   Report Status 10/13/2017 FINAL  Final  Culture, respiratory (NON-Expectorated)     Status: None (Preliminary result)   Collection Time: 10/15/17  8:12 AM  Result Value Ref Range Status   Specimen Description TRACHEAL ASPIRATE  Final   Special Requests Normal  Final   Gram Stain   Final    FEW WBC PRESENT, PREDOMINANTLY PMN RARE GRAM NEGATIVE RODS    Culture   Final    CULTURE REINCUBATED FOR BETTER GROWTH Performed at [facility], [place] [address] [place], [place] [address]    Report Status PENDING  Incomplete  Culture, blood (Routine X 2) w Reflex to ID Panel     Status: None (Preliminary result)   Collection Time: 10/15/17  8:52 AM  Result Value Ref Range  Status   Specimen Description BLOOD RIGHT HAND  Final   Special Requests IN PEDIATRIC BOTTLE Blood Culture adequate volume  Final   Culture   Final    NO GROWTH 1 DAY Performed at [facility], [place] [address] [place], [place] [address]    Report Status PENDING  Incomplete  Culture,  blood (Routine X 2) w Reflex to ID Panel     Status: None (Preliminary result)   Collection Time: 10/15/17 10:20 AM  Result Value Ref Range Status   Specimen Description BLOOD LEFT HAND  Final   Special Requests IN PEDIATRIC BOTTLE Blood Culture adequate volume  Final   Culture   Final    NO GROWTH 1 DAY Performed at [facility], [place] [address] [place], [place] [address]    Report Status PENDING  Incomplete  Culture, Urine     Status: None   Collection Time: 10/15/17 10:29 AM  Result Value Ref Range Status   Specimen Description URINE, CATHETERIZED  Final   Special Requests Normal  Final   Culture   Final    NO GROWTH Performed at [facility], [address], [place], [place] [address]    Report Status 10/16/2017 FINAL  Final   Studies/Results: Ct Abdomen Pelvis Wo Contrast  Result Date: 10/15/2017 CLINICAL DATA:  Fever of unknown origin. EXAM: CT CHEST, ABDOMEN AND PELVIS WITHOUT CONTRAST TECHNIQUE: Multidetector CT imaging of the chest, abdomen and pelvis was performed following the standard protocol without IV contrast. COMPARISON:  Chest CT 09/28/2017 FINDINGS: CT CHEST FINDINGS Cardiovascular: The heart is globally enlarged and stable. Stable position of the Impella device. No pericardial effusion. Mediastinum/Nodes: Numerous scattered mediastinal and hilar lymph nodes. The esophagus contains a feeding tube. Lungs/Pleura: Complex right pleural fluid collection worrisome for empyema or hemothorax despite the chest tube. Complex overlying infiltrate worrisome for necrotizing pneumonia. Small left effusion and overlying infiltrate with low-attenuation also worrisome for aggressive  pneumonia. Progressive finding since the prior CT scan. Musculoskeletal: No significant bony findings. CT ABDOMEN PELVIS FINDINGS Hepatobiliary: No focal hepatic lesions. The gallbladder is distended and contains layering gallstones. Pancreas: No mass, inflammation or ductal dilatation. Spleen: Grossly normal. Adrenals/Urinary Tract: Grossly normal. Stomach/Bowel: The stomach contains a feeding tube. The duodenum, small bowel and colon are grossly normal. No obstruction or mass. Vascular/Lymphatic: Stable vascular calcifications. Scattered mesenteric and retroperitoneal lymph nodes but no obvious mass or overt adenopathy. Reproductive: Foley catheter in the bladder. Prostate gland and seminal vesicles are grossly normal. Other: Mesenteric edema and small volume abdominal/pelvic ascites. There is also diffuse body wall edema suggesting anasarca. Musculoskeletal: No significant findings. IMPRESSION: 1. Worsening bilateral pulmonary infiltrates with areas of low attenuation worrisome for necrotizing pneumonia. 2. Large right and small left pleural effusions. The right effusion is larger and is complicated by high attenuation material worrisome for empyema or pleural hemorrhage despite the chest tube. 3. Stable cardiac enlargement and stable support apparatus. 4. Diffuse mesenteric edema and small volume abdominal/pelvic ascites. There is also diffuse body wall edema suggesting anasarca. 5. Distended gallbladder with layering gallstones. No obvious acute cholecystitis. This is likely cholestasis. Right upper quadrant ultrasound may be helpful for further evaluation. Electronically Signed   By: Marijo Sanes M.D.   On: 10/15/2017 16:10   Ct Chest Wo Contrast  Result Date: 10/15/2017 CLINICAL DATA:  Fever of unknown origin. EXAM: CT CHEST, ABDOMEN AND PELVIS WITHOUT CONTRAST TECHNIQUE: Multidetector CT imaging of the chest, abdomen and pelvis was performed following the standard protocol without IV contrast.  COMPARISON:  Chest CT 09/16/2017 FINDINGS: CT CHEST FINDINGS Cardiovascular: The heart is globally enlarged and stable. Stable position of the Impella device. No pericardial effusion. Mediastinum/Nodes: Numerous scattered mediastinal and hilar lymph nodes. The esophagus contains a feeding tube. Lungs/Pleura: Complex right pleural fluid collection worrisome for empyema or hemothorax despite the  chest tube. Complex overlying infiltrate worrisome for necrotizing pneumonia. Small left effusion and overlying infiltrate with low-attenuation also worrisome for aggressive pneumonia. Progressive finding since the prior CT scan. Musculoskeletal: No significant bony findings. CT ABDOMEN PELVIS FINDINGS Hepatobiliary: No focal hepatic lesions. The gallbladder is distended and contains layering gallstones. Pancreas: No mass, inflammation or ductal dilatation. Spleen: Grossly normal. Adrenals/Urinary Tract: Grossly normal. Stomach/Bowel: The stomach contains a feeding tube. The duodenum, small bowel and colon are grossly normal. No obstruction or mass. Vascular/Lymphatic: Stable vascular calcifications. Scattered mesenteric and retroperitoneal lymph nodes but no obvious mass or overt adenopathy. Reproductive: Foley catheter in the bladder. Prostate gland and seminal vesicles are grossly normal. Other: Mesenteric edema and small volume abdominal/pelvic ascites. There is also diffuse body wall edema suggesting anasarca. Musculoskeletal: No significant findings. IMPRESSION: 1. Worsening bilateral pulmonary infiltrates with areas of low attenuation worrisome for necrotizing pneumonia. 2. Large right and small left pleural effusions. The right effusion is larger and is complicated by high attenuation material worrisome for empyema or pleural hemorrhage despite the chest tube. 3. Stable cardiac enlargement and stable support apparatus. 4. Diffuse mesenteric edema and small volume abdominal/pelvic ascites. There is also diffuse body  wall edema suggesting anasarca. 5. Distended gallbladder with layering gallstones. No obvious acute cholecystitis. This is likely cholestasis. Right upper quadrant ultrasound may be helpful for further evaluation. Electronically Signed   By: Marijo Sanes M.D.   On: 10/15/2017 16:10   Dg Chest Port 1 View  Result Date: 10/16/2017 CLINICAL DATA:  Respiratory failure. EXAM: PORTABLE CHEST 1 VIEW COMPARISON:  Radiograph of October 15, 2017. FINDINGS: Stable cardiomegaly. Endotracheal tube is unchanged in position. Feeding tube is unchanged in position. Right internal jugular Swan-Ganz catheter is unchanged with distal tip in expected position of right pulmonary artery. Left subclavian catheter is unchanged in position. Right-sided chest tube is unchanged. No definite pneumothorax is seen currently. Stable bibasilar atelectasis or edema is noted with associated pleural effusions. Stable position of Impella device is noted. Bony thorax is unremarkable. IMPRESSION: Stable support apparatus. Right-sided chest tube is unchanged without definite evidence of pneumothorax. Stable bibasilar opacities are noted as described above. Electronically Signed   By: Marijo Conception, M.D.   On: 10/16/2017 09:52   Assessment/Plan:  INTERVAL HISTORY:  - Continues to be on the [employer] cooling system  - Impella removed yesterday after vegetation sucked into system  - Hemodynamics stable but patient continues to have a poor prognosis  - Approximately 910m from right chest tube yesterday  - Repeat blood and urine cultures from 02/05 showing no growth to date   Active Problems:   Atrial fibrillation with RVR (HCC)   Acute on chronic systolic heart failure ([facility]   Encounter for central line care   Shock circulatory ([facility]   Malnutrition of moderate degree   Cardiogenic shock (HCC)   LVAD (left ventricular assist device) present ([facility]   HCAP (healthcare-associated pneumonia)   Pressure injury of skin   AKI (acute kidney  injury) ([facility]   Central line infiltration (HCC)   CHF (congestive heart failure) ([place]   Community acquired pneumonia of right lower lobe of lung ([facility]   Hemothorax on right   Hx of chest tube placement   FUO (fever of unknown origin)   Pneumococcal bacteremia   Focal seizure ([facility]   Unresponsiveness   Seizure ([facility]   Acute bacterial endocarditis   Empyema ([place]  AKyle LuppinoDyakonovis a 67y.o.maleHFrEF and Atriat fibrillation who presented on 1/21in  A-fib with RVR and low output heart failure. He has subsequently has had a complicated hospitalization with placement of anImpellaand persistent fevers since 1/30. Unfortunately impella is no longer functional after vegetation on the MV chordae was sucked into the device. The device was subsequently removed. Patient continues to have a poor prognosis.   - Continue Teflaro and metronidazole for necrotic PNA - Repeat blood cultures from 2/5 showing no growth to date    LOS: 17 days   [facility] 10/17/2017, 8:36 AM

## 2017-10-17 NOTE — Progress Notes (Signed)
PULMONARY / CRITICAL CARE MEDICINE   Name: Bryan Harmon MRN: [id number] DOB: [birth year]    ADMISSION DATE:  10/01/2017  REFERRING MD:  Nils Pyle  CHIEF COMPLAINT:  Fatigue  HISTORY OF PRESENT ILLNESS:   67 y/o male with non-ischemic cardiomyopathy and afib admitted with decompensated heart failure.  Required Impella, vent.     Lines/ Tubes 1/30 L IJ CVL > 1/31 1/30 R IJ swan > 1/30 R sub clav impella > 10/16/2017 1/30 L radial arterial line > 10/15/2017 1/30 ETT >  1/31 L Dotsero TLC >  10/15/2017 right femoral arterial line placed>>   Cultures: 1/21 Blood >> Strep Pneumoniae >> pan sens 1/21 Resp Viral Panel >> + Parainfluenza Virus 3 1/28 blood > ng 1/30 blood > ng 1/31 resp culture > ng 2/1 BAL > candida 10/15/2017 blood cultures x2>> 10/15/2017 tracheal aspirate>> 10/15/2017 urine culture>> negative  ABX: Infectious disease consult 10/14/2017 Rocephin 1/21 >> 1/29 Doxy 1/21 >> 1/23 Vanc 1/30 >> 10/14/2017 Mero 1/30 >> 10/15/2017 Eraxis 2/2 >> off Teflaro 10/15/2017>> Flagyl 10/15/2017>>  Studies: 10/01/2017>> Echo EF 15-20%, Apex ? PAP muscle vs. Thrombus, LV severely dilated, moderate concentric hypertrophy, systolic function normal, unable to evaluate LV diastolic function due to atrial fibrillation. + moderate spontaneous echo contrast, indicative of stasis Impression 1/23 Echo > Severely dilated LV with severe LV dysfunction globally with EF 15-20%. Severely dialted RV with severe RV dysfunction. Moderate to severe MR with ERO 0.33cm2 and MR volume 87m. Moderate tosevere TR with moderate pulmonary HTN. Moderately thickened and calcified AV leaflets with mild MR, mildly dilated aortic root, massive biatrial enlargement. Cannot rule out LV thrombus. Thereis significant spontaneous echo contrast in LV c/w sluggish blood flow. The right ventricular systolic pressure was increasedconsistent with moderate pulmonary hypertension. 1/29 CT chest > dense bilateral lower lobe  consolidation R>L 1/30 TEE >> LVEF 20-25%, impella in place, poor overall contractility but LV improved    SUBJECTIVE:   T-max 98.7.  And patella has been removed.  Obvious increased work of breathing noted. Milrinone drip Amiodarone drip Norepinephrine drip Heparin drip Precedex drip Impella removed on 10/16/2017  VITAL SIGNS: BP 98/83   Pulse (!) 119   Temp 98.2 F (36.8 C)   Resp (!) 26   Ht _0  (1.778 m)   Wt 97.5 kg (215 lb) Comment: Different bed from previous weight  SpO2 94%   BMI 30.85 kg/m   HEMODYNAMICS: PAP: (23-100)/(14-40) 41/25 CVP:  [3 mmHg-32 mmHg] 5 mmHg CO:  [4.9 L/min-6.4 L/min] 6.4 L/min CI:  [2.5 L/min/m2-3.3 L/min/m2] 3.3 L/min/m2  VENTILATOR SETTINGS: Vent Mode: PRVC FiO2 (%):  [40 %] 40 % Set Rate:  [22 bmp] 22 bmp Vt Set:  [540 mL] 540 mL PEEP:  [5 cmH20] 5 cmH20 Plateau Pressure:  [16 cmH20-19 cmH20] 19 cmH20  INTAKE / OUTPUT: I/O last 3 completed shifts: In: 7864.5 [I.V.:3523.9; Blood:641; Other:69.6; NG/GT:2330; IV Piggyback:1300] Out: 58527[Urine:2715; Stool:1400; Chest Tube:1415]  PHYSICAL EXAMINATION: General: Volume overload male, responsive to verbal stimuli follows commands. HEENT: Pupils equal reactive to light, tracks and follows, right IJ pulmonary artery catheter in place, endotracheal tube in place, feeding tube in place right nares, left subclavian CVL dated PSY: Dull effect Neuro: Moves all extremities x4 to command CV: Heart sounds are irregular atrial fib with a rapid ventricular rate of 132 at time of examination PULM: even/non-labored, lungs bilaterally decreased at the base, abdominal chest wall paradoxus is noted, GPO:EUMP non-tender, bsx4 active  Extremities: warm/dry, 4+ edema edema  Skin: Sacral breakdown is noted.     LABS:  BMET Recent Labs  Lab 10/15/17 1330  10/16/17 0404 10/16/17 1550 10/17/17 0454  NA 137   < > 137 137 139  K 4.3   < > 4.2 4.1 3.6  CL 102   < > 102 100* 104  CO2 24  --  23   --  23  BUN 92*   < > 96* 89* 94*  CREATININE 2.47*   < > 2.66* 2.80* 2.71*  GLUCOSE 132*   < > 127* 146* 116*   < > = values in this interval not displayed.    Electrolytes Recent Labs  Lab 10/11/17 0355  10/15/17 0302 10/15/17 1330 10/16/17 0404 10/17/17 0454  CALCIUM 7.7*   < > 7.3* 7.2* 7.3* 7.4*  MG 2.0   < > 2.1  --  2.1 2.3  PHOS 3.2  --   --   --  4.4 4.4   < > = values in this interval not displayed.    CBC Recent Labs  Lab 10/15/17 0302  10/16/17 0404 10/16/17 1550 10/17/17 0454  WBC 15.9*  --  19.4*  --  17.3*  HGB 7.8*   < > 7.8* 8.5* 8.2*  HCT 23.6*   < > 23.0* 25.0* 24.3*  PLT 75*  --  102*  --  115*   < > = values in this interval not displayed.    Coag's Recent Labs  Lab 10/15/17 0302 10/16/17 0404 10/17/17 0454  APTT 105* 82* 72*    Sepsis Markers Recent Labs  Lab 10/10/17 0926 10/11/17 0355 10/12/17 0359  PROCALCITON 8.49 6.65 5.76    ABG Recent Labs  Lab 10/16/17 1557 10/16/17 2349 10/17/17 0515  PHART 7.446 7.373 7.465*  PCO2ART 35.9 41.8 35.5  PO2ART 80.0* 73.0* 101    Liver Enzymes Recent Labs  Lab 10/13/17 0253 10/14/17 0240 10/15/17 1029  AST 65* 63* 68*  ALT 69* 60 44  ALKPHOS 76 69 75  BILITOT 1.2 1.2 1.8*  ALBUMIN 1.8* 1.7* 1.6*    Cardiac Enzymes No results for input(s): TROPONINI, PROBNP in the last 168 hours.  Glucose Recent Labs  Lab 10/16/17 1317 10/16/17 1530 10/16/17 2003 10/16/17 2349 10/17/17 0526 10/17/17 0751  GLUCAP 122* 150* 131* 124* 79 106*    Imaging No results found.    DISCUSSION: 67 y/o male with ischemic cardiomyopathy admitted with decompensated systolic heart failure in the setting of strep pneumo pneumonia and bacteremia s/p impella.  He has acute on chronic kidney failure, cardiogenic shock.  10/15/2017 with fever 105.6, pancultured 01/2018, ID consult 10/14/2017. 's overall cardiac function has declined despite all interventions.  Family wants to continue with maximum  interventions.  The question of CODE STATUS was again addressed on 10/16/2017 per cardiology with family again.  CODE STATUS is retained.  ASSESSMENT / PLAN:  PULMONARY A: Acute respiratory failure with hypoxemia Pneumococcal CAP - RLL  Acute pulmonary edema Bilateral pleural effusions , R >> L P:   Attempt PS trials today but doubt will go well, family requesting that. 10/17/2017 he is in obvious respiratory distress with increased work of breathing. Titrate O2 for sat of 88-92%  CARDIOVASCULAR A:  Acute decompensated cardiomyopathy > severe biventricular failure, LVEF ~ 5% S/p Impella 1/30 with removal on 10/16/2017 after failure most likely from vegetation been ingested by the Impella intake. Cardiogenic Shock.  Multiple vasopressor dependent Medical Non-Compliance - stopped taking all meds prior to admit for CHF, ?  Long term plan 10/11/2017 right chest tube P:  Volume management, inotrope management as per advanced heart failure team plans, TCTS plans.  Ct levophed/ milrinone gtt Amiodarone drip Poor prognosis with worsening multiorgan dysfunction as of 10/17/2017, especially with impella now being removed on 10/16/2017 CT to water seal  RENAL Lab Results  Component Value Date   CREATININE 2.71 (H) 10/17/2017   CREATININE 2.80 (H) 10/16/2017   CREATININE 2.66 (H) 10/16/2017   CREATININE 1.11 03/25/2012   Recent Labs  Lab 10/16/17 0404 10/16/17 1550 10/17/17 0454  K 4.2 4.1 3.6   Recent Labs  Lab 10/16/17 0404 10/16/17 1550 10/17/17 0454  NA 137 137 139   A:   Worsening renal failure  Hypokalemia/hypervolemic P:   Continue to follow urine output, BMP Replace electrolytes as indicated Avoid nephrotoxic agents, ensure adequate renal perfusion  GASTROINTESTINAL A:   No acute issues P:   Tube feeding per nutrition Pepcid IV for ulcer prophylaxis  HEMATOLOGIC Recent Labs    10/16/17 1550 10/17/17 0454  HGB 8.5* 8.2*   Lab Results  Component Value Date    INR 1.33 09/26/2017   INR 1.10 07/05/2016   INR 0.98 10/08/2012   A:   Anemia, thrombocytopenia - blood loss via chest tube Concern for hemolytic anemia with impella, note high LDH P:  PRBCs per CHF service, goal Hb 8 & above Transfused 4 units of packed cells 10/14/2017 Follow CBC and transfusion per cardiology service with 2 units of packed cells on 10/15/2017 Remains on heparin Right chest tube intermittently dumps up to 150 cc of bloody drainage.  INFECTIOUS A:   Strep Pneumonia Bacteremia - 1/21 - completed 10 ds of rocephin Severe CAP HCAP? >  No evidence thus far based on BAL from 2/1 Fever 1/30 - new, considering RLL, sinusitis with NGT, multiple lines Parainfluenza virus pneumonia Persistent right pleural effusion appears complex and could be empyema despite right chest tube P:   Vancomycin discontinued on 10/14/2017 Continue meropenem off Infectious disease consult 10/14/2017 appreciated 10/17/2017 T-max 98.4 Currently on Teflaro and Flagyl per ID both  started on 10/15/2017 Suspect that yeast in BAL is a contaminant, empiric  Consider drug fever - ID consulted ,note pct was high Note 10/15/2017 fever was 105.6 he was placed on Arctic sun for cooling. 10/15/2017 due to fever spike multiple invasive catheters we will pan culture 10/15/2017 Spoke with cardiovascular thoracic surgeons 10/16/2017 no indication for further chest tube.  Question of lytic therapy in the future.  ENDOCRINE CBG (last 3)  Recent Labs    10/16/17 2349 10/17/17 0526 10/17/17 0751  GLUCAP 124* 79 106*   A:   Hyperglycemia  P:   Sliding scale insulin per protocol  NEUROLOGIC A:   Sedation Need / Mechanical Ventilation  Suspected seizure 10/14/2017 neurology consult Suspect he may be secondary to febrile state. EEG performed P:   RASS goal: -1  Sedation protocol: -1> continue precedex, PRN fentanyl, versed  EEG with no seizure activity noted.  Neurology has signed off as of 10/16/2017  FAMILY  -  Updates: 10/17/2017 Long discussion with wife and son concerning establishing a limited resuscitation of no shock no chest compressions.  They listened at length all their questions were answered.  They are having trouble separating limited CODE BLUE with no shock and compressions from continuation of care.  They are entertaining the idea of making him a limited CODE BLUE but not officially at this time.  App CCT 60 min  Richardson Landry Minor  ACNP Maryanna Shape PCCM Pager [phone] till 1 pm If no answer page [phone] 10/17/2017, 8:06 AM  Attending Note:  67 year old male with PMH of extensive cardiac history how presents to PCCM in respiratory failure and cardiogenic shock.  On exam, he is alert and following commands.  I reviewed CXR myself, ETT is ok and pulmonary edema note.  CT in place.  Spoke with family again, continues to be full code per family's request.  They feel patient will improve.  Will continue abx for now.  Continue weaning efforts.  PCCM will continue to follow.  The patient is critically ill with multiple organ systems failure and requires high complexity decision making for assessment and support, frequent evaluation and titration of therapies, application of advanced monitoring technologies and extensive interpretation of multiple databases.   Critical Care Time devoted to patient care services described in this note is  35  Minutes. This time reflects time of care of this signee Dr Jennet Maduro. This critical care time does not reflect procedure time, or teaching time or supervisory time of PA/NP/Med student/Med Resident etc but could involve care discussion time.  Rush Farmer, M.D. [facility] Pulmonary/Critical Care Medicine. Pager: [phone]. After hours pager: [phone].

## 2017-10-17 NOTE — Progress Notes (Signed)
ANTICOAGULATION CONSULT NOTE - Follow Up Consult  Pharmacy Consult for Heparin Indication: atrial fibrillation and LA thrombus  No Known Allergies  Patient Measurements: Height: 5\' 10"  (177.8 cm) Weight: 215 lb (97.5 kg)(Different bed from previous weight) IBW/kg (Calculated) : 73   Vital Signs: Temp: 98.2 F (36.8 C) (02/07 0800) Temp Source: Core (02/07 0000) BP: 88/62 (02/07 0800) Pulse Rate: 66 (02/07 0800)  Labs: Recent Labs    10/15/17 0302  10/16/17 0404 10/16/17 1550 10/16/17 2240 10/17/17 0454 10/17/17 0756  HGB 7.8*   < > 7.8* 8.5*  --  8.2*  --   HCT 23.6*   < > 23.0* 25.0*  --  24.3*  --   PLT 75*  --  102*  --   --  115*  --   APTT 105*  --  82*  --   --  72*  --   HEPARINUNFRC 0.33  --  0.23*  --  0.16*  --  0.16*  CREATININE 2.53*   < > 2.66* 2.80*  --  2.71*  --    < > = values in this interval not displayed.    Estimated Creatinine Clearance: 31.8 mL/min (A) (by C-G formula based on SCr of 2.71 mg/dL (H)).  Medications: Heparin @ 1700 units/hr  Assessment: 66yom known to pharmacy for heparin management with impella. Impella removed 2/6 due to stoppage from small vegetation that got sucked into the catheter. Heparin resumed ~ 4 hours after removal for afib and LA thrombus. Heparin level below goal at 0.16. Hgb stable, platelets improving. Chest tube still with bloody drainage but slowing - to water seal today.  Goal of Therapy:  Heparin level 0.3 units/ml Monitor platelets by anticoagulation protocol: Yes   Plan:  1) Increase heparin to 1850 units/hr 2) Check 6 hour heparin level 3) Daily heparin level and CBC  Louie Casa, Pharm D, BCPS   10/17/2017 8:56 AM

## 2017-10-17 NOTE — Progress Notes (Signed)
8 Days Post-Op Procedure(s) (LRB): PLACEMENT OF IMPELLA 5.0 LEFT VENTRICULAR ASSIST DEVICE (N/A) TRANSESOPHAGEAL ECHOCARDIOGRAM (TEE) (N/A) Subjective: LVAD removed yesterday 900 cc serosanguinous drainage from R chest tube CXR improved Chest tube to water seal  Objective: Vital signs in last 24 hours: Temp:  [97 F (36.1 C)-99.3 F (37.4 C)] 98.2 F (36.8 C) (02/07 0700) Pulse Rate:  [25-135] 119 (02/07 0700) Cardiac Rhythm: Atrial fibrillation (02/07 0000) Resp:  [11-47] 26 (02/07 0700) BP: (67-121)/(19-87) 98/83 (02/07 0700) SpO2:  [54 %-100 %] 94 % (02/07 0700) Arterial Line BP: (83-132)/(49-72) 102/61 (02/07 0700) FiO2 (%):  [40 %] 40 % (02/07 0347) Weight:  [215 lb (97.5 kg)] 215 lb (97.5 kg) (02/07 0400)  Hemodynamic parameters for last 24 hours: PAP: (23-100)/(14-40) 41/25 CVP:  [3 mmHg-32 mmHg] 5 mmHg CO:  [4.9 L/min-6.4 L/min] 6.4 L/min CI:  [2.5 L/min/m2-3.3 L/min/m2] 3.3 L/min/m2  Intake/Output from previous day: 02/06 0701 - 02/07 0700 In: 5457.3 [I.V.:2281.3; Blood:641; NG/GT:1650; IV Piggyback:850] Out: 4230 [Urine:1895; Stool:1400; Chest Tube:935] Intake/Output this shift: Total I/O In: 203.5 [I.V.:83.5; NG/GT:120] Out: 295 [Urine:225; Chest Tube:70]  Acute on chronic ill appearing Chest tube w/o airleak Chest incision dry  Lab Results: Recent Labs    10/16/17 0404 10/16/17 1550 10/17/17 0454  WBC 19.4*  --  17.3*  HGB 7.8* 8.5* 8.2*  HCT 23.0* 25.0* 24.3*  PLT 102*  --  115*   BMET:  Recent Labs    10/16/17 0404 10/16/17 1550 10/17/17 0454  NA 137 137 139  K 4.2 4.1 3.6  CL 102 100* 104  CO2 23  --  23  GLUCOSE 127* 146* 116*  BUN 96* 89* 94*  CREATININE 2.66* 2.80* 2.71*  CALCIUM 7.3*  --  7.4*    PT/INR: No results for input(s): LABPROT, INR in the last 72 hours. ABG    Component Value Date/Time   PHART 7.465 (H) 10/17/2017 0515   HCO3 25.2 10/17/2017 0515   TCO2 26 10/16/2017 2349   ACIDBASEDEF 1.0 10/16/2017 2349   O2SAT 98.2 10/17/2017 0515   CBG (last 3)  Recent Labs    10/16/17 2349 10/17/17 0526 10/17/17 0751  GLUCAP 124* 79 106*    Assessment/Plan: S/P Procedure(s) (LRB): PLACEMENT OF IMPELLA 5.0 LEFT VENTRICULAR ASSIST DEVICE (N/A) TRANSESOPHAGEAL ECHOCARDIOGRAM (TEE) (N/A) Leave chest tube  cxr in am   LOS: 17 days    Kathlee Nations Trigt III 10/17/2017

## 2017-10-17 NOTE — Progress Notes (Signed)
eLink Physician-Brief Progress Note Patient Name: Bryan Harmon DOB: 13-Dec-1950 MRN: [id number]   Date of Service  10/17/2017  HPI/Events of Note  Vent asynchrony on camera, appears in discomfort , not relieved by fent/ versed/ precedex gtt  eICU Interventions  Suction/ lavage by RT, copious secretions Was on PRVC after wean from am - tried PC again but seems worse, ABG ok Will await CXR & accept some asynchrony     Intervention Category Major Interventions: Other:  [name] 10/17/2017, 12:03 AM

## 2017-10-17 NOTE — Progress Notes (Signed)
Patient ID: Bryan Harmon, male   DOB: July 11, 1951, 67 y.o.   MRN: [id number]     Advanced Heart Failure Rounding Note  PCP:  Primary Cardiologist: Aundra Dubin   Subjective:    Impella 5.0 placed in the OR 1/30.  Chest tube placed on right 1/31.   2/4 ID consulted for ongoing fever. 2/4 Neuro consulted for ?seizure. CT of head with no acute findings. EEG without seizure activity.  Echo for Impella placement on 2/5 showed a small vegetation on a mitral valve chord.   2/5 CT of chest/abd/pelvis: Worsening pulmonary infiltrates.  Necrotizing PNA on right with complex pleural effusion. Also with aggressive left base PNA.    2/6 Impella removed due to blockage => suspect small vegetation on MV chord was drawn into the Impella, stopping the device.   On amio 30 mg/hr, milrinone 0.5 mcg, norepi 12 mcg. Remains intubated.   LDH [id number]>922>674 Creatinine 2.2>2.5 >2.6>2.7  Swan: CVP 5-6  PA 41/19  CI 3.3   Co-ox 59%.   Echo: Severe LV dilation, EF 15-20%, severely dilated RV with severely decreased RV systolic function, moderate-severe Bryan, moderate-severe TR, cannot rule out LV apical thrombus.  Repeat echo with Definity showed definite LV apical thrombus.   TEE (1/28): EF 10-15%, diffuse hypokinesis, small LV apical thrombus, mildly dilated RV with moderately decreased systolic function, no LAA thrombus.    Objective:   Weight Range: 215 lb (97.5 kg) Body mass index is 30.85 kg/m.   Vital Signs:   Temp:  [97 F (36.1 C)-99.3 F (37.4 C)] 98.2 F (36.8 C) (02/07 0700) Pulse Rate:  [25-135] 119 (02/07 0700) Resp:  [11-47] 26 (02/07 0700) BP: (67-121)/(19-87) 98/83 (02/07 0700) SpO2:  [54 %-100 %] 94 % (02/07 0700) Arterial Line BP: (83-132)/(49-72) 102/61 (02/07 0700) FiO2 (%):  [40 %] 40 % (02/07 0347) Weight:  [215 lb (97.5 kg)] 215 lb (97.5 kg) (02/07 0400) Last BM Date: 10/16/17  Weight change: Filed Weights   10/15/17 0430 10/16/17 0300 10/17/17 0400  Weight: 190  lb 8.7 oz (86.4 kg) 200 lb 0.4 oz (90.7 kg) 215 lb (97.5 kg)    Intake/Output:   Intake/Output Summary (Last 24 hours) at 10/17/2017 0716 Last data filed at 10/17/2017 0700 Gross per 24 hour  Intake 5457.31 ml  Output 4230 ml  Net 1227.31 ml      Physical Exam  CVP 5-6  General:  Intubated. [place] Sun in place.  HEENT: ETT  Neck: supple. JVP 5-6. Carotids 2+ bilat; no bruits. No lymphadenopathy or thryomegaly appreciated. Cor: PMI nondisplaced. Irregular rate & rhythm. No rubs, gallops or murmurs. Staples intact R axillary  Lungs: R chest tube.  Abdomen: soft, nontender, nondistended. No hepatosplenomegaly. No bruits or masses. Good bowel sounds. Extremities: no cyanosis, clubbing, rash, R and LLE 1+ edema, also 1+ edema of arms/hands.  Neuro: Intubated. Awake following commands.  Skin: sacrum, coccyx, mid back   Telemetry  Afib 110s   EKG    No new tracings.    Labs    CBC Recent Labs    10/16/17 0404 10/16/17 1550 10/17/17 0454  WBC 19.4*  --  17.3*  NEUTROABS 16.6*  --  15.4*  HGB 7.8* 8.5* 8.2*  HCT 23.0* 25.0* 24.3*  MCV 89.5  --  89.7  PLT 102*  --  062*   Basic Metabolic Panel Recent Labs    10/16/17 0404 10/16/17 1550 10/17/17 0454  NA 137 137 139  K 4.2 4.1 3.6  CL 102 100*  104  CO2 23  --  23  GLUCOSE 127* 146* 116*  BUN 96* 89* 94*  CREATININE 2.66* 2.80* 2.71*  CALCIUM 7.3*  --  7.4*  MG 2.1  --  2.3  PHOS 4.4  --  4.4   Liver Function Tests Recent Labs    10/15/17 1029  AST 68*  ALT 44  ALKPHOS 75  BILITOT 1.8*  PROT 3.6*  ALBUMIN 1.6*   No results for input(s): LIPASE, AMYLASE in the last 72 hours. Cardiac Enzymes No results for input(s): CKTOTAL, CKMB, CKMBINDEX, TROPONINI in the last 72 hours.  BNP: BNP (last 3 results) Recent Labs    12/13/16 0949 02/11/17 1049 09/18/2017 1218  BNP 369.2* 562.7* 2,485.9*    ProBNP (last 3 results) No results for input(s): PROBNP in the last 8760 hours.   D-Dimer No results for  input(s): DDIMER in the last 72 hours. Hemoglobin A1C No results for input(s): HGBA1C in the last 72 hours. Fasting Lipid Panel No results for input(s): CHOL, HDL, LDLCALC, TRIG, CHOLHDL, LDLDIRECT in the last 72 hours. Thyroid Function Tests No results for input(s): TSH, T4TOTAL, T3FREE, THYROIDAB in the last 72 hours.  Invalid input(s): FREET3  Other results:   Imaging    No results found.   Medications:     Scheduled Medications: . chlorhexidine gluconate (MEDLINE KIT)  15 mL Mouth Rinse BID  . Chlorhexidine Gluconate Cloth  6 each Topical Daily  . feeding supplement (PRO-STAT SUGAR FREE 64)  30 mL Per Tube TID  . Gerhardt's butt cream   Topical BID  . insulin aspart  2-6 Units Subcutaneous Q4H  . insulin glargine  12 Units Subcutaneous BID  . mouth rinse  15 mL Mouth Rinse 10 times per day  . sennosides  5 mL Oral Daily  . sodium chloride flush  3 mL Intravenous Q12H    Infusions: . sodium chloride 20 mL/hr at 10/15/17 0608  . sodium chloride Stopped (10/15/17 0220)  . sodium chloride 20 mL/hr at 10/17/17 0600  . sodium chloride    . amiodarone 30 mg/hr (10/17/17 0600)  . ceFTAROline (TEFLARO) IV Stopped (10/17/17 0115)  . dexmedetomidine (PRECEDEX) IV infusion for high rates 1.2 mcg/kg/hr (10/17/17 0600)  . famotidine (PEPCID) IV Stopped (10/16/17 0957)  . feeding supplement (VITAL 1.5 CAL) 1,000 mL (10/17/17 0600)  . heparin 1,700 Units/hr (10/17/17 0600)  . metronidazole Stopped (10/17/17 6834)  . milrinone 0.5 mcg/kg/min (10/17/17 0600)  . norepinephrine (LEVOPHED) Adult infusion 12 mcg/min (10/17/17 0600)    PRN Medications: sodium chloride, acetaminophen (TYLENOL) oral liquid 160 mg/5 mL, albuterol, fentaNYL (SUBLIMAZE) injection, fentaNYL (SUBLIMAZE) injection, magic mouthwash, midazolam, morphine injection, ondansetron (ZOFRAN) IV, sodium chloride flush, sodium chloride flush    Patient Profile  Bryan Harmon is a 67 year old with a history of PAF S/P  DC-CV 11/2016, s/p bilateral inguinal hernia repair 11/2017, PVCs, HTN, NICM, chronic systolic heart failure.   Sent from Urgent Care with A fib RVR. Acutely SOB on arrival   Assessment/Plan   1. Acute hypoxemic respiratory failure: RLL PNA, Strep pneumoniae in blood cultures. Respiratory cultures with parainfluenzae virus. Also acute/chronic systolic CHF.Now intubated s/p Impella 5.0 placement, multifocal PNA on CXR and CT chest. Remains critically ill.  Antibiotics broadened from ceftriaxone to vancomycin/meropenem on 1/30. BAL with Candida albicans, anidulafungin started 2/2.  Necrotizing PNA with complex pleural effusion on right on 2/5 CT, anti-microbials changed to ceftaroline and Flagyl on 2/5.   - Right chest tube placed 1/31, still  with serosanguineous drainage. CT drainage slowing.    2.Acute on chronic systolic CHF-> cardiogenic shock : Nonischemic cardiomyopathy. 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 Etiology of his CHF is not clear =>no definite inciting event. He has a history of HTN but doubt this was the only trigger. 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 This would ideally be confirmed by cMRI, but he has not wanted an MRI (concerned about side effects). He also has frequent PVCs, 21% total on last holter in 4/18 which is a risk for fall in EF.No family history of CMP. Cannot rule out viral myocarditis. SPEP negative. With medical management, he initially felt a lot better. However, he quit all his meds in early 2018 with recurrence of NYHA III symptoms and onset of atrial fibrillation.He had TEE-DCCV in 3/18.TEE showed that EF remained25%. He quit his meds again in 4/18 and apparently did not restart them when I asked him to in 6/18. No meds probably since 4/18. Echo this admission with EF 15-20%, severe RV dysfunction, LV apical thrombus. RV is hypokinetic.  Attempted DCCV on 1/28  failed likely due to pressors/inotropes.  [place] 1/29 with low cardiac output => Impella 5.0 placed 1/30.  On 2/6, Impella removed due to pump stop (suspect vegetation from MV chord sucked into Impella). Hemodynamically stable so far with Impella out, CI 3.3, co-ox 59%. No plan to replace Impella. CVP remains in 5-6 range.  - Continue to hold diuretics with low CVP.  - Continue milrinone 0.5 mcg + norepi 12 mcg.    -  End point difficult to envision => would be poor LVAD candidate with renal failure and RV failure.   3. 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 It is possible that the PVCs contribute to his cardiomyopathy.Mag 2.1 K 4.1 Stable.  -Onamiodarone. No change. 4. Atrial fibrillation:Persistent, now with RVR. HR up to 140sinitially, now 110s on amiodarone gtt at30. Not sure how long he has been in atrial fibrillation with marked RVR, but this may have triggered his CHF and AKI due to worsening of cardiac output.  Remains in A fib 110s.   -Heparin gtt ongoing. - Continue amiodarone gtt at 30 mg/hr.   - Failed TEE-guided DCCV on 1/28 in setting of vasoactive meds.  If we can wean down milrinone/norepinephrine, will re-attempt DCCV in future.   5. AKI: Creatinine 1.08 in 6/18, he has not been on any meds. Suspect cardiorenal syndrome with afib/RVR and fall in cardiac output.He was initially hyperkalemic and acidotic.Creatinine fairly stable at 2.7.  6. BT:DVVOH pneumo PNA as well as parainfluenza virus.CT chest 1/29 with multifocal PNA. On  1/30 to vancomycin/meropenem, then anidulafungin added.  Tmax 100.4 despite [employer].  ID has seen. Now on Teflaro and metronidazole. CT of chest/abd/pelvis 2/5 concerning for necrotizing right pneumonia with surrounding complex large pleural effusion.  2/5 showed a small vegetation on a mitral valve chord.  Repeat ECHO on 2/6 did not show vegetation. Assumed it was pulled into Impella.  He remains on [facility] for fevers.  -  Continue current antibiotics, pending further culture data.  ID following.  7. Elevated LFTs: Suspect shock liver type picture. - AST/ALT > 1000 at admission but have trended down steadily.  8. LV thrombus: Noted on TTE and TEE, small.  - Remains on heparin.   9. Hyponatremia: resolved.  10. Malnutrition: tube feeds.    11. Anemia: Significant blood loss via chest tube  and mouth.  ?Hemolysis from Impella playing a role => need higher CVP, providing with blood and volume.  Received 2UPRBCs 2/3, 2/4 5 uPRBCs, 2/5 2UPRBCs. 2.6 2UPRBCs. Hgb 8.2 today, no transfusion.    12. Suspected Deep Tissue Injury -->Sacrum/R buttock/Mid back . Continue to repostion R/L. WOC consult appreciated.   13. Seizure- Neurology appreciated. CT head negative. EEG no seizure noted.    We continue to have end of life conversations.   Amy Clegg NP-C  7:16 AM   Patient seen with NP, agree with the above note. He is remains critically ill and very tenuous.Afebrile on [place].  CT 2/5 with right necrotizing PNA and likely empyema.  Chest tube remains in place with ongoing serosanguineous drainage.    Impella stopped yesterday, likely due to vegetation from mitral valve chord being sucked into catheter (vegetation gone on repeat echo after pump stop).  Impella removed.  Surprisingly, hemodynamics are stable today with CVP 5-6 and CI 3.3 on Swan.   He is awake and following commands.   Has RIJ Swan and left subclavian central line. JVP not elevated.1+ edema to knees, 2+ edema of forearms.Extremities more warm. Decreased breath sounds dependently.Sacral decubitus.   See plan outlined in above note.  Continue current support with milrinone 0.5 and norepinephrine12.  Will not wean milrinone yet with marginal co-ox (though CI looks ok).    Atrial fibrillation rate controlled on amiodarone.   He is now on Teflaro + Flagyl with necrotizing PNA and suspected empyema on right.  ID following. Will try to take  him off [employer] today, see if fever recurs.   If we are unable to wean vent, will have to consider tracheostomy.   Developing decubitus ulcer.   At this time, long-term prognosis is poor with multisystem organ failure. Have discussed poor prognosis with family, they want to continue aggressive treatment for the time being.  We have had another code discussion today, they are considering limited code.   CRITICAL CARE Performed by: Loralie Champagne  Total critical care time: 35 minutes  Critical care time was exclusive of separately billable procedures and treating other patients.  Critical care was necessary to treat or prevent imminent or life-threatening deterioration.  Critical care was time spent personally by me on the following activities: development of treatment plan with patient and/or surrogate as well as nursing, discussions with consultants, evaluation of patient's response to treatment, examination of patient, obtaining history from patient or surrogate, ordering and performing treatments and interventions, ordering and review of laboratory studies, ordering and review of radiographic studies, pulse oximetry and re-evaluation of patient's condition.  Loralie Champagne 10/17/2017 9:53 AM

## 2017-10-18 ENCOUNTER — Inpatient Hospital Stay (HOSPITAL_COMMUNITY): Payer: Medicare Other

## 2017-10-18 LAB — CBC WITH DIFFERENTIAL/PLATELET
Basophils Absolute: 0 10*3/uL (ref 0.0–0.1)
Basophils Relative: 0 %
Eosinophils Absolute: 0 10*3/uL (ref 0.0–0.7)
Eosinophils Relative: 0 %
HEMATOCRIT: 25.5 % — AB (ref 39.0–52.0)
HEMOGLOBIN: 8.2 g/dL — AB (ref 13.0–17.0)
LYMPHS ABS: 0.5 10*3/uL — AB (ref 0.7–4.0)
LYMPHS PCT: 4 %
MCH: 30.4 pg (ref 26.0–34.0)
MCHC: 32.2 g/dL (ref 30.0–36.0)
MCV: 94.4 fL (ref 78.0–100.0)
Monocytes Absolute: 0.5 10*3/uL (ref 0.1–1.0)
Monocytes Relative: 4 %
Neutro Abs: 12.5 10*3/uL — ABNORMAL HIGH (ref 1.7–7.7)
Neutrophils Relative %: 92 %
Platelets: 164 10*3/uL (ref 150–400)
RBC: 2.7 MIL/uL — AB (ref 4.22–5.81)
RDW: 17 % — ABNORMAL HIGH (ref 11.5–15.5)
WBC: 13.5 10*3/uL — AB (ref 4.0–10.5)

## 2017-10-18 LAB — POCT I-STAT, CHEM 8
BUN: 71 mg/dL — ABNORMAL HIGH (ref 6–20)
CHLORIDE: 109 mmol/L (ref 101–111)
Calcium, Ion: 1.11 mmol/L — ABNORMAL LOW (ref 1.15–1.40)
Creatinine, Ser: 2.6 mg/dL — ABNORMAL HIGH (ref 0.61–1.24)
Glucose, Bld: 127 mg/dL — ABNORMAL HIGH (ref 65–99)
HCT: 23 % — ABNORMAL LOW (ref 39.0–52.0)
Hemoglobin: 7.8 g/dL — ABNORMAL LOW (ref 13.0–17.0)
Potassium: 4.4 mmol/L (ref 3.5–5.1)
Sodium: 145 mmol/L (ref 135–145)
TCO2: 23 mmol/L (ref 22–32)

## 2017-10-18 LAB — POCT I-STAT 3, ART BLOOD GAS (G3+)
Acid-base deficit: 1 mmol/L (ref 0.0–2.0)
BICARBONATE: 23.2 mmol/L (ref 20.0–28.0)
O2 Saturation: 95 %
PCO2 ART: 36.3 mmHg (ref 32.0–48.0)
PO2 ART: 76 mmHg — AB (ref 83.0–108.0)
TCO2: 24 mmol/L (ref 22–32)
pH, Arterial: 7.416 (ref 7.350–7.450)

## 2017-10-18 LAB — GLUCOSE, CAPILLARY
GLUCOSE-CAPILLARY: 109 mg/dL — AB (ref 65–99)
GLUCOSE-CAPILLARY: 121 mg/dL — AB (ref 65–99)
GLUCOSE-CAPILLARY: 126 mg/dL — AB (ref 65–99)
GLUCOSE-CAPILLARY: 156 mg/dL — AB (ref 65–99)
Glucose-Capillary: 151 mg/dL — ABNORMAL HIGH (ref 65–99)
Glucose-Capillary: 157 mg/dL — ABNORMAL HIGH (ref 65–99)

## 2017-10-18 LAB — COOXEMETRY PANEL
Carboxyhemoglobin: 1.4 % (ref 0.5–1.5)
Methemoglobin: 1.2 % (ref 0.0–1.5)
O2 Saturation: 58.6 %
Total hemoglobin: 8 g/dL — ABNORMAL LOW (ref 12.0–16.0)

## 2017-10-18 LAB — CULTURE, RESPIRATORY: SPECIAL REQUESTS: NORMAL

## 2017-10-18 LAB — MAGNESIUM: Magnesium: 2.4 mg/dL (ref 1.7–2.4)

## 2017-10-18 LAB — BASIC METABOLIC PANEL
Anion gap: 12 (ref 5–15)
BUN: 82 mg/dL — ABNORMAL HIGH (ref 6–20)
CHLORIDE: 109 mmol/L (ref 101–111)
CO2: 22 mmol/L (ref 22–32)
Calcium: 7.4 mg/dL — ABNORMAL LOW (ref 8.9–10.3)
Creatinine, Ser: 2.49 mg/dL — ABNORMAL HIGH (ref 0.61–1.24)
GFR, EST AFRICAN AMERICAN: 30 mL/min — AB (ref >60)
GFR, EST NON AFRICAN AMERICAN: 26 mL/min — AB (ref >60)
Glucose, Bld: 166 mg/dL — ABNORMAL HIGH (ref 65–99)
POTASSIUM: 3.5 mmol/L (ref 3.5–5.1)
SODIUM: 143 mmol/L (ref 135–145)

## 2017-10-18 LAB — HEPARIN LEVEL (UNFRACTIONATED): Heparin Unfractionated: 0.27 IU/mL — ABNORMAL LOW (ref 0.30–0.70)

## 2017-10-18 LAB — APTT: aPTT: 98 seconds — ABNORMAL HIGH (ref 24–36)

## 2017-10-18 LAB — PHOSPHORUS: PHOSPHORUS: 4 mg/dL (ref 2.5–4.6)

## 2017-10-18 LAB — CULTURE, RESPIRATORY W GRAM STAIN

## 2017-10-18 LAB — LACTATE DEHYDROGENASE: LDH: 591 U/L — ABNORMAL HIGH (ref 98–192)

## 2017-10-18 LAB — CALCIUM, IONIZED: Calcium, Ionized, Serum: 4.5 mg/dL (ref 4.5–5.6)

## 2017-10-18 MED ORDER — MILRINONE LACTATE IN DEXTROSE 20-5 MG/100ML-% IV SOLN
0.5000 ug/kg/min | INTRAVENOUS | Status: DC
  Administered 2017-10-18 (×2): 0.375 ug/kg/min via INTRAVENOUS
  Administered 2017-10-19: 0.25 ug/kg/min via INTRAVENOUS
  Administered 2017-10-19: 0.375 ug/kg/min via INTRAVENOUS
  Administered 2017-10-20 – 2017-10-21 (×3): 0.25 ug/kg/min via INTRAVENOUS
  Administered 2017-10-22 (×2): 0.375 ug/kg/min via INTRAVENOUS
  Administered 2017-10-22: 0.25 ug/kg/min via INTRAVENOUS
  Administered 2017-10-23 – 2017-10-27 (×16): 0.5 ug/kg/min via INTRAVENOUS
  Filled 2017-10-18 (×27): qty 100

## 2017-10-18 MED ORDER — POTASSIUM CHLORIDE 20 MEQ/15ML (10%) PO SOLN
40.0000 meq | ORAL | Status: AC
  Administered 2017-10-18 (×2): 40 meq
  Filled 2017-10-18 (×2): qty 30

## 2017-10-18 NOTE — Progress Notes (Signed)
PULMONARY / CRITICAL CARE MEDICINE   Name: Bryan Harmon MRN: [id number] DOB: Jul 11, 1951    ADMISSION DATE:  10/03/2017  REFERRING MD:  Nils Pyle  CHIEF COMPLAINT:  Fatigue  HISTORY OF PRESENT ILLNESS:   67 y/o male with non-ischemic cardiomyopathy and afib admitted with decompensated heart failure.  Required Impella, vent.    Lines/ Tubes 1/30 L IJ CVL > 1/31 1/30 R IJ swan > 1/30 R sub clav impella > 10/16/2017 1/30 L radial arterial line > 10/15/2017 1/30 ETT >  1/31 L [place] TLC >  10/15/2017 right femoral arterial line placed>>  Cultures: 1/21 Blood >> Strep Pneumoniae >> pan sens 1/21 Resp Viral Panel >> + Parainfluenza Virus 3 1/28 blood > ng 1/30 blood > ng 1/31 resp culture > ng 2/1 BAL > candida 10/15/2017 blood cultures x2>> 10/15/2017 tracheal aspirate>> 10/15/2017 urine culture>> negative  ABX: Infectious disease consult 10/14/2017 Rocephin 1/21 >> 1/29 Doxy 1/21 >> 1/23 Vanc 1/30 >> 10/14/2017 Mero 1/30 >> 10/15/2017 Eraxis 2/2 >> off Teflaro 10/15/2017>> Flagyl 10/15/2017>>  Studies: 10/01/2017>> Echo EF 15-20%, Apex ? PAP muscle vs. Thrombus, LV severely dilated, moderate concentric hypertrophy, systolic function normal, unable to evaluate LV diastolic function due to atrial fibrillation. + moderate spontaneous echo contrast, indicative of stasis Impression 1/23 Echo > Severely dilated LV with severe LV dysfunction globally with EF 15-20%. Severely dialted RV with severe RV dysfunction. Moderate to severe MR with ERO 0.33cm2 and MR volume 50m. Moderate tosevere TR with moderate pulmonary HTN. Moderately thickened and calcified AV leaflets with mild MR, mildly dilated aortic root, massive biatrial enlargement. Cannot rule out LV thrombus. Thereis significant spontaneous echo contrast in LV c/w sluggish blood flow. The right ventricular systolic pressure was increasedconsistent with moderate pulmonary hypertension. 1/29 CT chest > dense bilateral lower lobe  consolidation R>L 1/30 TEE >> LVEF 20-25%, impella in place, poor overall contractility but LV improved    SUBJECTIVE:   T-max 99.5 and patella has been removed.  Obvious increased work of breathing noted.  Cardiac index greater than 3 Milrinone drip Amiodarone drip Norepinephrine drip Heparin drip Precedex drip Impella removed on 10/16/2017  VITAL SIGNS: BP 101/74   Pulse (!) 122   Temp 99.9 F (37.7 C)   Resp (!) 33   Ht _0  (1.778 m)   Wt 99.3 kg (219 lb)   SpO2 98%   BMI 31.42 kg/m   HEMODYNAMICS: PAP: (29-53)/(9-29) 42/22 CVP:  [2 mmHg-9 mmHg] 7 mmHg CO:  [6.7 L/min-6.9 L/min] 6.9 L/min CI:  [3.4 L/min/m2-3.5 L/min/m2] 3.5 L/min/m2  VENTILATOR SETTINGS: Vent Mode: CPAP;PSV FiO2 (%):  [40 %] 40 % Set Rate:  [22 bmp] 22 bmp Vt Set:  [540 mL] 540 mL PEEP:  [5 cmH20] 5 cmH20 Pressure Support:  [5 cmH20] 5 cmH20 Plateau Pressure:  [19 cmH20] 19 cmH20  INTAKE / OUTPUT: I/O last 3 completed shifts: In: 6515.9 [I.V.:3195.9; Other:150; NG/GT:2370; IV Piggyback:800] Out: 53009[[QZRAQ:7622 Stool:1000; Chest Tube:890]  PHYSICAL EXAMINATION: General: Severely volume overloaded appearing male HEENT: Right IJ PA catheter, left subclavian central line, endotracheal tube to ventilator core tract to tube feeding PSY: Dull Neuro: Follows commands at times and is interactive CV: Heart sounds are regular heart rate of 130, atrial fibrillation, despite being on amiodarone drip PULM: Decreased breath sounds bilaterally GQJ:FHLK non-tender, bsx4 active  Extremities: warm/dry, 2+ 3+ edema  Skin: Multiple areas of skin breakdown pressure points      LABS:  BMET Recent Labs  Lab 10/16/17 0404  10/17/17 0454 10/17/17 1622 10/18/17 0400  NA 137   < > 139 141 143  K 4.2   < > 3.6 3.7 3.5  CL 102   < > 104 104 109  CO2 23  --  23  --  22  BUN 96*   < > 94* 77* 82*  CREATININE 2.66*   < > 2.71* 2.60* 2.49*  GLUCOSE 127*   < > 116* 144* 166*   < > = values in this  interval not displayed.    Electrolytes Recent Labs  Lab 10/16/17 0404 10/17/17 0454 10/18/17 0400  CALCIUM 7.3* 7.4* 7.4*  MG 2.1 2.3 2.4  PHOS 4.4 4.4 4.0    CBC Recent Labs  Lab 10/16/17 0404  10/17/17 0454 10/17/17 1622 10/18/17 0459  WBC 19.4*  --  17.3*  --  13.5*  HGB 7.8*   < > 8.2* 8.5* 8.2*  HCT 23.0*   < > 24.3* 25.0* 25.5*  PLT 102*  --  115*  --  164   < > = values in this interval not displayed.    Coag's Recent Labs  Lab 10/16/17 0404 10/17/17 0454 10/18/17 0400  APTT 82* 72* 98*    Sepsis Markers Recent Labs  Lab 10/12/17 0359  PROCALCITON 5.76    ABG Recent Labs  Lab 10/16/17 2349 10/17/17 0515 10/18/17 0446  PHART 7.373 7.465* 7.416  PCO2ART 41.8 35.5 36.3  PO2ART 73.0* 101 76.0*    Liver Enzymes Recent Labs  Lab 10/13/17 0253 10/14/17 0240 10/15/17 1029  AST 65* 63* 68*  ALT 69* 60 44  ALKPHOS 76 69 75  BILITOT 1.2 1.2 1.8*  ALBUMIN 1.8* 1.7* 1.6*    Cardiac Enzymes No results for input(s): TROPONINI, PROBNP in the last 168 hours.  Glucose Recent Labs  Lab 10/17/17 0526 10/17/17 0751 10/17/17 1136 10/17/17 1627 10/17/17 2019 10/18/17 0006  GLUCAP 79 106* 119* 139* 143* 157*    Imaging Dg Chest Port 1 View  Result Date: 10/18/2017 CLINICAL DATA:  Respiratory failure EXAM: PORTABLE CHEST 1 VIEW COMPARISON:  10/17/2017 FINDINGS: Cardiac shadow remains enlarged. Endotracheal tube and feeding catheter are again seen and stable. Left subclavian central line is again seen and stable. Swan-Ganz catheter is noted within the right pulmonary artery. Right-sided thoracostomy catheter is again noted. No evidence of pneumothorax is seen. Stable bibasilar opacities are noted IMPRESSION: Overall stable appearance of the chest. Electronically Signed   By: Inez Catalina M.D.   On: 10/18/2017 07:14   DISCUSSION: 67 y/o male with ischemic cardiomyopathy admitted with decompensated systolic heart failure in the setting of strep  pneumo pneumonia and bacteremia s/p impella.  He has acute on chronic kidney failure, cardiogenic shock.  10/15/2017 with fever 105.6, pancultured 01/2018, ID consult 10/14/2017. 's overall cardiac function has declined despite all interventions.  Family wants to continue with maximum interventions.  10/18/2016 CODE STATUS changed to a limited with no chest compressions but all other interventions will be continued.  Despite his poor prognosis he is continued to improve therefore possibility of tracheostomy may be undertaken within 72 hours.  ASSESSMENT / PLAN:  PULMONARY A: Acute respiratory failure with hypoxemia Pneumococcal CAP - RLL  Acute pulmonary edema Bilateral pleural effusions , R >> L P:   Attempt PS trials today but doubt will go well, family requesting that. 10/17/2017 he is in obvious respiratory distress with increased work of breathing. Titrate O2 for sat of 88-92% 10/18/2017 discussion with family concerning goals of care.  If he continues to remain stable and/or improved entertain the idea of tracheostomy with the next 72 hours.  Do not think he would tolerate a trial extubation due to his multiple organ dysfunction and severely debilitated state.  Tracheostomy would be the safest route for him.  CARDIOVASCULAR A:  Acute decompensated cardiomyopathy > severe biventricular failure, LVEF ~ 5% S/p Impella 1/30 with removal on 10/16/2017 after failure most likely from vegetation been ingested by the Impella intake. Cardiogenic Shock.  Multiple vasopressor dependent Medical Non-Compliance - stopped taking all meds prior to admit for CHF, ? Long term plan 10/11/2017 right chest tube P:  Volume management, inotrope management as per advanced heart failure team plans, TCTS plans.  Note CVP is 4, milrinone is being weaned on 10/18/2017 Ct levophed/ milrinone gtt Amiodarone drip, if stabilizes consider cardioversion in the future. Poor prognosis but as of 10/18/2017 despite removal of assist device  he is continue to maintain cardiac index greater than 3 with a CVP of 4. CT to water seal  RENAL Lab Results  Component Value Date   CREATININE 2.49 (H) 10/18/2017   CREATININE 2.60 (H) 10/17/2017   CREATININE 2.71 (H) 10/17/2017   CREATININE 1.11 03/25/2012   Recent Labs  Lab 10/17/17 0454 10/17/17 1622 10/18/17 0400  K 3.6 3.7 3.5   Recent Labs  Lab 10/17/17 0454 10/17/17 1622 10/18/17 0400  NA 139 141 143   A:   Stable renal failure  Hypokalemia/hypervolemic P:   Continue to follow urine output, BMP Replace electrolytes as indicated Avoid nephrotoxic agents, ensure adequate renal perfusion  GASTROINTESTINAL A:   No acute issues P:   Tube feeding per nutrition Pepcid IV for ulcer prophylaxis  HEMATOLOGIC Recent Labs    10/17/17 1622 10/18/17 0459  HGB 8.5* 8.2*   Lab Results  Component Value Date   INR 1.33 10/10/2017   INR 1.10 07/05/2016   INR 0.98 10/08/2012   A:   Anemia, thrombocytopenia - blood loss via chest tube Concern for hemolytic anemia with impella, note high LDH P:  PRBCs per CHF service, goal Hb 8 & above Transfused 4 units of packed cells 10/14/2017 Follow CBC and transfusion per cardiology service with 2 units of packed cells on 10/15/2017 Remains on heparin Right chest tube intermittently dumps up to 150 cc of bloody drainage.  INFECTIOUS A:   Strep Pneumonia Bacteremia - 1/21 - completed 10 ds of rocephin Severe CAP HCAP? >  No evidence thus far based on BAL from 2/1 Fever 1/30 - new, considering RLL, sinusitis with NGT, multiple lines Parainfluenza virus pneumonia Persistent right pleural effusion appears complex and could be empyema despite right chest tube P:   Vancomycin discontinued on 10/14/2017 Continue meropenem off Infectious disease consult 10/14/2017 appreciated 10/17/2017 T-max 98.4 Currently on Teflaro and Flagyl per ID both  started on 10/15/2017 Suspect that yeast in BAL is a contaminant, empiric  Consider drug  fever - ID consulted ,note pct was high Note 10/15/2017 fever was 105.6 he was placed on [place] sun for cooling. 10/15/2017 due to fever spike multiple invasive catheters we will pan culture 10/15/2017 Spoke with cardiovascular thoracic surgeons 10/16/2017 no indication for further chest tube.  Question of lytic therapy in the future.  Note 10/18/2017 chest 2 continues to drain approximately 30-50 cc an hour bloody drainage.  ENDOCRINE CBG (last 3)  Recent Labs    10/17/17 1627 10/17/17 2019 10/18/17 0006  GLUCAP 139* 143* 157*   A:   Hyperglycemia  P:  Sliding scale insulin per protocol  NEUROLOGIC A:   Sedation Need / Mechanical Ventilation  Suspected seizure 10/14/2017 neurology consult Suspect he may be secondary to febrile state. EEG performed P:   RASS goal: -1  Sedation protocol: -1> continue precedex, PRN fentanyl, versed  EEG with no seizure activity noted.  Neurology has signed off as of 10/16/2017 10/19/2017 awake and follows commands no further seizure activity.  FAMILY  - Updates: 10/18/2017 discussion with family with Richardson Landry Minor NP and Dr. Aundra Dubin concerning outcomes and progress.  He continues to survive and his cardiac index is improved and he is weaning on the ventilator.  After prolonged discussion we have decided along with the family making a limited CODE BLUE.  No chest compressions.  Otherwise he is a full code.  Further discussed the option of tracheostomy in the near future and should he continue to thrive we will entertain the idea of tracheostomy tube in the next 72 hours.  We did reiterate that he is very sick and has a poor prognosis.  App CCT 60 min  Richardson Landry Minor ACNP Maryanna Shape PCCM Pager [phone] till 1 pm If no answer page [phone] 10/18/2017, 9:18 AM  Attending Note:  67 year old male with PMH of severe cardiac disease that presents to [facility] with acute on chronic systolic heart failure, respiratory failure, renal failure and cardiogenic shock.  Patient had  CV mechanical support that is now out and now on respiratory support.  Did not tolerate wean very well this AM.  On exam, fluid overloaded.  Would like to see him more fluid negative at this time.  I reviewed CXR myself, support apparatus in good position.  Will continue support for now.  Begin PS trials and will continue to monitor.  Family agreeable to to LCB no CPR only.    The patient is critically ill with multiple organ systems failure and requires high complexity decision making for assessment and support, frequent evaluation and titration of therapies, application of advanced monitoring technologies and extensive interpretation of multiple databases.   Critical Care Time devoted to patient care services described in this note is  35  Minutes. This time reflects time of care of this signee Dr Jennet Maduro. This critical care time does not reflect procedure time, or teaching time or supervisory time of PA/NP/Med student/Med Resident etc but could involve care discussion time.  Rush Farmer, M.D. [facility] Pulmonary/Critical Care Medicine. Pager: [phone]. After hours pager: [phone].

## 2017-10-18 NOTE — Progress Notes (Signed)
9 Days Post-Op Procedure(s) (LRB): PLACEMENT OF IMPELLA 5.0 LEFT VENTRICULAR ASSIST DEVICE (N/A) TRANSESOPHAGEAL ECHOCARDIOGRAM (TEE) (N/A) Subjective: R chest tube drainage decreased- 500 cc due to CHF R axillary artery incision clean, dry Objective: Vital signs in last 24 hours: Temp:  [98.6 F (37 C)-99.9 F (37.7 C)] 99.9 F (37.7 C) (02/08 0900) Pulse Rate:  [39-136] 122 (02/08 0900) Cardiac Rhythm: Atrial fibrillation (02/07 2000) Resp:  [18-46] 33 (02/08 0900) BP: (73-111)/(50-90) 101/74 (02/08 0900) SpO2:  [89 %-99 %] 98 % (02/08 0900) Arterial Line BP: (78-111)/(44-66) 107/62 (02/08 0900) FiO2 (%):  [40 %] 40 % (02/08 0824) Weight:  [219 lb (99.3 kg)] 219 lb (99.3 kg) (02/08 0600)  Hemodynamic parameters for last 24 hours: PAP: (29-53)/(9-29) 42/22 CVP:  [2 mmHg-9 mmHg] 7 mmHg CO:  [6.7 L/min-6.9 L/min] 6.9 L/min CI:  [3.4 L/min/m2-3.5 L/min/m2] 3.5 L/min/m2  Intake/Output from previous day: 02/07 0701 - 02/08 0700 In: 3972.8 [I.V.:1942.8; NG/GT:1560; IV Piggyback:350] Out: 3425 [Urine:2295; Stool:600; Chest Tube:530] Intake/Output this shift: Total I/O In: 100 [NG/GT:100] Out: 225 [Urine:175; Chest Tube:50]  sedated on vent Low O2 sats No air leak from chest tube Lab Results: Recent Labs    10/17/17 0454 10/17/17 1622 10/18/17 0459  WBC 17.3*  --  13.5*  HGB 8.2* 8.5* 8.2*  HCT 24.3* 25.0* 25.5*  PLT 115*  --  164   BMET:  Recent Labs    10/17/17 0454 10/17/17 1622 10/18/17 0400  NA 139 141 143  K 3.6 3.7 3.5  CL 104 104 109  CO2 23  --  22  GLUCOSE 116* 144* 166*  BUN 94* 77* 82*  CREATININE 2.71* 2.60* 2.49*  CALCIUM 7.4*  --  7.4*    PT/INR: No results for input(s): LABPROT, INR in the last 72 hours. ABG    Component Value Date/Time   PHART 7.416 10/18/2017 0446   HCO3 23.2 10/18/2017 0446   TCO2 24 10/18/2017 0446   ACIDBASEDEF 1.0 10/18/2017 0446   O2SAT 58.6 10/18/2017 0500   CBG (last 3)  Recent Labs    10/17/17 2019  10/18/17 0006 10/18/17 0925  GLUCAP 143* 157* 151*    Assessment/Plan: S/P Procedure(s) (LRB): PLACEMENT OF IMPELLA 5.0 LEFT VENTRICULAR ASSIST DEVICE (N/A) TRANSESOPHAGEAL ECHOCARDIOGRAM (TEE) (N/A) Cont chest tube to water seal   LOS: 18 days    Kathlee Nations Trigt III 10/18/2017

## 2017-10-18 NOTE — Progress Notes (Signed)
Patient ID: Bryan Harmon, male   DOB: Apr 01, 1951, 67 y.o.   MRN: [id number]     Advanced Heart Failure Rounding Note  PCP:  Primary Cardiologist: Aundra Dubin   Subjective:    Impella 5.0 placed in the OR 1/30.  Chest tube placed on right 1/31.   2/4 ID consulted for ongoing fever. 2/4 Neuro consulted for ?seizure. CT of head with no acute findings. EEG without seizure activity.  Echo for Impella placement on 2/5 showed a small vegetation on a mitral valve chord.   2/5 CT of chest/abd/pelvis: Worsening pulmonary infiltrates.  Necrotizing PNA on right with complex pleural effusion. Also with aggressive left base PNA.    2/6 Impella removed due to blockage => suspect small vegetation on MV chord was drawn into the Impella, stopping the device.   On amio 30 mg/hr, milrinone 0.5 mcg, norepi 11 mcg. Remains intubated. Asynchronous on the vent.   LDH [id number] Creatinine 2.2>2.5 >2.6>2.7>2.49   Swan: CVP 3-4 PA 26/14 CI 3.5   Co-ox 59%.   Echo: Severe LV dilation, EF 15-20%, severely dilated RV with severely decreased RV systolic function, moderate-severe Bryan, moderate-severe TR, cannot rule out LV apical thrombus.  Repeat echo with Definity showed definite LV apical thrombus.   TEE (1/28): EF 10-15%, diffuse hypokinesis, small LV apical thrombus, mildly dilated RV with moderately decreased systolic function, no LAA thrombus.    Objective:   Weight Range: 219 lb (99.3 kg) Body mass index is 31.42 kg/m.   Vital Signs:   Temp:  [98.2 F (36.8 C)-99.7 F (37.6 C)] 99.5 F (37.5 C) (02/08 0600) Pulse Rate:  [28-136] 119 (02/08 0600) Resp:  [18-46] 37 (02/08 0600) BP: (73-111)/(50-90) 111/74 (02/08 0600) SpO2:  [89 %-99 %] 89 % (02/08 0600) Arterial Line BP: (78-111)/(44-66) 111/59 (02/08 0600) FiO2 (%):  [40 %] 40 % (02/08 0402) Weight:  [219 lb (99.3 kg)] 219 lb (99.3 kg) (02/08 0600) Last BM Date: 10/16/17  Weight change: Filed Weights   10/16/17 0300  10/17/17 0400 10/18/17 0600  Weight: 200 lb 0.4 oz (90.7 kg) 215 lb (97.5 kg) 219 lb (99.3 kg)    Intake/Output:   Intake/Output Summary (Last 24 hours) at 10/18/2017 0653 Last data filed at 10/18/2017 0600 Gross per 24 hour  Intake 4136.26 ml  Output 3425 ml  Net 711.26 ml      Physical Exam  CVP 3-4  General:  Intubated. Asynchronous on the vent  HEENT: ETT Neck: supple. no JVD. Carotids 2+ bilat; no bruits. No lymphadenopathy or thryomegaly appreciated. Cor: PMI nondisplaced. Tachy Irregular  rate & rhythm. No rubs, gallops or murmurs. R upper chest dressing.   Lungs: coarse throughout R chest tube  Abdomen: soft, nontender, nondistended. No hepatosplenomegaly. No bruits or masses. Good bowel sounds. Extremities: no cyanosis, clubbing, rash, R and LLE 1+  Neuro: intubated. Awake follows commands.  GU: foley yellow urine.  Skin: sacrum/coccyx, mid back foam dressing.    Telemetry  A fib 130s with an episode of  NSVT.   EKG    No new tracings.    Labs    CBC Recent Labs    10/17/17 0454 10/17/17 1622 10/18/17 0459  WBC 17.3*  --  13.5*  NEUTROABS 15.4*  --  12.5*  HGB 8.2* 8.5* 8.2*  HCT 24.3* 25.0* 25.5*  MCV 89.7  --  94.4  PLT 115*  --  676   Basic Metabolic Panel Recent Labs    10/17/17 0454 10/17/17 1622 10/18/17 0400  NA 139 141 143  K 3.6 3.7 3.5  CL 104 104 109  CO2 23  --  22  GLUCOSE 116* 144* 166*  BUN 94* 77* 82*  CREATININE 2.71* 2.60* 2.49*  CALCIUM 7.4*  --  7.4*  MG 2.3  --  2.4  PHOS 4.4  --  4.0   Liver Function Tests Recent Labs    10/15/17 1029  AST 68*  ALT 44  ALKPHOS 75  BILITOT 1.8*  PROT 3.6*  ALBUMIN 1.6*   No results for input(s): LIPASE, AMYLASE in the last 72 hours. Cardiac Enzymes No results for input(s): CKTOTAL, CKMB, CKMBINDEX, TROPONINI in the last 72 hours.  BNP: BNP (last 3 results) Recent Labs    12/13/16 0949 02/11/17 1049 09/18/2017 1218  BNP 369.2* 562.7* 2,485.9*    ProBNP (last 3  results) No results for input(s): PROBNP in the last 8760 hours.   D-Dimer No results for input(s): DDIMER in the last 72 hours. Hemoglobin A1C No results for input(s): HGBA1C in the last 72 hours. Fasting Lipid Panel No results for input(s): CHOL, HDL, LDLCALC, TRIG, CHOLHDL, LDLDIRECT in the last 72 hours. Thyroid Function Tests No results for input(s): TSH, T4TOTAL, T3FREE, THYROIDAB in the last 72 hours.  Invalid input(s): FREET3  Other results:   Imaging    No results found.   Medications:     Scheduled Medications: . chlorhexidine gluconate (MEDLINE KIT)  15 mL Mouth Rinse BID  . Chlorhexidine Gluconate Cloth  6 each Topical Daily  . feeding supplement (PRO-STAT SUGAR FREE 64)  30 mL Per Tube TID  . Gerhardt's butt cream   Topical BID  . insulin aspart  2-6 Units Subcutaneous Q4H  . insulin glargine  12 Units Subcutaneous BID  . mouth rinse  15 mL Mouth Rinse 10 times per day  . sennosides  5 mL Oral Daily  . sodium chloride flush  3 mL Intravenous Q12H    Infusions: . sodium chloride 20 mL/hr at 10/15/17 0608  . amiodarone 30 mg/hr (10/18/17 0600)  . ceFTAROline (TEFLARO) IV Stopped (10/18/17 0014)  . dexmedetomidine (PRECEDEX) IV infusion for high rates 1.2 mcg/kg/hr (10/18/17 2706)  . famotidine (PEPCID) IV Stopped (10/17/17 1010)  . feeding supplement (VITAL 1.5 CAL) 1,000 mL (10/18/17 0600)  . heparin 2,150 Units/hr (10/18/17 0600)  . metronidazole Stopped (10/18/17 0330)  . milrinone 0.5 mcg/kg/min (10/18/17 0600)  . norepinephrine (LEVOPHED) Adult infusion 11 mcg/min (10/18/17 0600)    PRN Medications: sodium chloride, acetaminophen (TYLENOL) oral liquid 160 mg/5 mL, albuterol, magic mouthwash, midazolam, morphine injection, ondansetron (ZOFRAN) IV, sodium chloride flush, sodium chloride flush    Patient Profile  Bryan Harmon is a 67 year old with a history of PAF S/P DC-CV 11/2016, s/p bilateral inguinal hernia repair 11/2017, PVCs, HTN, NICM,  chronic systolic heart failure.   Sent from Urgent Care with A fib RVR. Acutely SOB on arrival   Assessment/Plan   1. Acute hypoxemic respiratory failure: RLL PNA, Strep pneumoniae in blood cultures. Respiratory cultures with parainfluenzae virus. Also acute/chronic systolic CHF.Now intubated s/p Impella 5.0 placement, multifocal PNA on CXR and CT chest. Remains critically ill.  Antibiotics broadened from ceftriaxone to vancomycin/meropenem on 1/30. BAL with Candida albicans, anidulafungin started 2/2.  Necrotizing PNA with complex pleural effusion on right on 2/5 CT, anti-microbials changed to ceftaroline and Flagyl on 2/5.   - Right chest tube placed 1/31, still with serosanguineous drainage. -  CT slowing.   2.Acute on chronic systolic CHF-> cardiogenic  shock : Nonischemic cardiomyopathy. 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 Etiology of his CHF is not clear =>no definite inciting event. He has a history of HTN but doubt this was the only trigger. 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 This would ideally be confirmed by cMRI, but he has not wanted an MRI (concerned about side effects). He also has frequent PVCs, 21% total on last holter in 4/18 which is a risk for fall in EF.No family history of CMP. Cannot rule out viral myocarditis. SPEP negative. With medical management, he initially felt a lot better. However, he quit all his meds in early 2018 with recurrence of NYHA III symptoms and onset of atrial fibrillation.He had TEE-DCCV in 3/18.TEE showed that EF remained25%. He quit his meds again in 4/18 and apparently did not restart them when I asked him to in 6/18. No meds probably since 4/18. Echo this admission with EF 15-20%, severe RV dysfunction, LV apical thrombus. RV is hypokinetic.  Attempted DCCV on 1/28 failed likely due to pressors/inotropes.  Blanket 1/29 with low cardiac output =>  Impella 5.0 placed 1/30.  On 2/6, Impella removed due to pump stop (suspect vegetation from MV chord sucked into Impella).  CO-OX 59%. CI 3.5  No plan to replace Impella. CVP 3-4.  - Good UOP, with low CVP will continue to hold diuretic.  - Wean norepinephrine as able.  With good cardiac output, will decrease milrinone to 0.375.     -  End point difficult to envision => would be poor LVAD candidate with renal failure and RV failure.   3. 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 It is possible that the PVCs contribute to his cardiomyopathy.Mag 2.1 K 4.1 Stable.  -Onamiodarone. No change. 4. Atrial fibrillation:Persistent, now with RVR. HR up to 140sinitially, now 110s on amiodarone gtt at30. Not sure how long he has been in atrial fibrillation with marked RVR, but this may have triggered his CHF and AKI due to worsening of cardiac output.   - AFib RVR 120s  -Heparin gtt ongoing. - Continue amiodarone gtt at 30 mg/hr.   - Failed TEE-guided DCCV on 1/28 in setting of vasoactive meds.  If we can wean down milrinone/norepinephrine, will re-attempt DCCV in future.   5. AKI: Creatinine 1.08 in 6/18, he has not been on any meds. Suspect cardiorenal syndrome with afib/RVR and fall in cardiac output.He was initially hyperkalemic and acidotic.Creatinine down to 2.5.  6. ZO:XWRUE pneumo PNA as well as parainfluenza virus.CT chest 1/29 with multifocal PNA. On  1/30 to vancomycin/meropenem, then anidulafungin added.    ID has seen. Now on Teflaro and metronidazole. CT of chest/abd/pelvis 2/5 concerning for necrotizing right pneumonia with surrounding complex large pleural effusion.  2/5 showed a small vegetation on a mitral valve chord.  Repeat ECHO on 2/6 did not show vegetation. Assumed it was pulled into Impella.  He is now off [employer] with no further fevers.  - Continue current antibiotics, pending further culture data.  Cultures with no growth. WBC coming down.  ID following.   7. Elevated LFTs: Suspect shock liver type picture. - AST/ALT > 1000 at admission but have trended down steadily.  8. LV thrombus: Noted on TTE and TEE, small.  - Remains on heparin.   9. Hyponatremia: resolved.  10. Malnutrition: tube feeds.    11. Anemia: Significant blood loss via chest tube and mouth.  ?Hemolysis from Impella playing a  role => need higher CVP, providing with blood and volume.  Received 2UPRBCs 2/3, 2/4 5 uPRBCs, 2/5 2UPRBCs. 2.6 2UPRBCs. Hgb 8.2. Stable.     12. Suspected Deep Tissue Injury -->Sacrum/R buttock/Mid back . Continue to repostion R/L. WOC consult appreciated.   13. Seizure- Neurology appreciated. CT head negative. EEG no seizure noted.    Amy Clegg NP-C  6:53 AM   Patient seen with NP, agree with the above note. He is remains critically ill and very tenuous.Afebrile on [place]. CT 2/5 with right necrotizing PNA and likely empyema. Chest tube remains in place with ongoing serosanguineous drainage.   Impella stopped 2/6, likely due to vegetation from mitral valve chord being sucked into catheter (vegetation gone on repeat echo after pump stop).  Impella removed. Hemodynamics have remained stable off Impella, CI 3.5 today with CVP 3-4.   He is awake and following commands.   Has RIJ Swan and left subclavian central line. JVP not elevated.1+ edema to knees, 2+ edema of forearms. Extremities warm. Decreased breath sounds dependently.Sacral decubitus.   See plan outlined in above note.  With good CI by Luiz Blare, I am going to decrease milrinone to 0.375 today.  Wean norepinephrine as able with BP.   Atrial fibrillation rate controlled on amiodarone. If we are able to wean milrinone and norepinephrine further, will re-attempt DCCV.   He is now on Teflaro + Flagyl with necrotizing PNA and suspected empyema on right. ID following. He is now off [place] with no fever.    Has been weaning daily on PSV.  Suspect he will need tracheostomy.    Developing decubitus ulcer.   Long-term prognosis remains poor with multisystem organ failure. Have discussed poor prognosiswith family, they want to continue aggressive treatment for the time being.  He is stable today but still requiring a lot of support with no end-point at this time.We have had another code discussion today, he will be limited code (no CPR but ok to shock and use drugs).   CRITICAL CARE Performed by: Loralie Champagne  Total critical care time: 35 minutes  Critical care time was exclusive of separately billable procedures and treating other patients.  Critical care was necessary to treat or prevent imminent or life-threatening deterioration.  Critical care was time spent personally by me on the following activities: development of treatment plan with patient and/or surrogate as well as nursing, discussions with consultants, evaluation of patient's response to treatment, examination of patient, obtaining history from patient or surrogate, ordering and performing treatments and interventions, ordering and review of laboratory studies, ordering and review of radiographic studies, pulse oximetry and re-evaluation of patient's condition.  Loralie Champagne 10/18/2017 11:18 AM

## 2017-10-18 NOTE — Plan of Care (Signed)
Pt nutrition maintained with Vital 1.5 via Cortac. Cardiovascular and respiratory maintained with vent, levophed, milrinone, and amiodarone.

## 2017-10-18 NOTE — Progress Notes (Signed)
ANTICOAGULATION CONSULT NOTE - Follow Up Consult  Pharmacy Consult for Heparin Indication: atrial fibrillation and LA thrombus  Allergies  Allergen Reactions  . Fentanyl And Related Other (See Comments)    Patients eyes roll in the back of his head and patient has seizure like activity    Patient Measurements: Height: 5\' 10"  (177.8 cm) Weight: 219 lb (99.3 kg) IBW/kg (Calculated) : 73   Vital Signs: Temp: 100.8 F (38.2 C) (02/08 1100) BP: 80/63 (02/08 1100) Pulse Rate: 131 (02/08 1100)  Labs: Recent Labs    10/16/17 0404  10/17/17 0454  10/17/17 1436 10/17/17 1622 10/17/17 2318 10/18/17 0400 10/18/17 0459 10/18/17 0800  HGB 7.8*   < > 8.2*  --   --  8.5*  --   --  8.2*  --   HCT 23.0*   < > 24.3*  --   --  25.0*  --   --  25.5*  --   PLT 102*  --  115*  --   --   --   --   --  164  --   APTT 82*  --  72*  --   --   --   --  98*  --   --   HEPARINUNFRC 0.23*   < >  --    < > 0.10*  --  0.22*  --   --  0.27*  CREATININE 2.66*   < > 2.71*  --   --  2.60*  --  2.49*  --   --    < > = values in this interval not displayed.    Estimated Creatinine Clearance: 34.9 mL/min (A) (by C-G formula based on SCr of 2.49 mg/dL (H)).  Medications: Heparin @ 2150 units/hr  Assessment: Bryan Harmon known to pharmacy for heparin management with impella. Impella removed 2/6 due to stoppage from small vegetation that got sucked into the catheter. Heparin resumed ~ 4 hours after removal for afib and LA thrombus. Heparin level at goal 0.27. Hgb stable, platelets improving. Chest tube still with bloody drainage but slowing - to water seal.  Goal of Therapy:  Heparin level 0.3 units/ml Monitor platelets by anticoagulation protocol: Yes   Plan:  1) Continue heparin at 2150 units/hr 2) Daily heparin level and CBC  Louie Casa, Pharm D, BCPS   10/18/2017 11:55 AM

## 2017-10-19 ENCOUNTER — Inpatient Hospital Stay (HOSPITAL_COMMUNITY): Payer: Medicare Other

## 2017-10-19 DIAGNOSIS — A419 Sepsis, unspecified organism: Secondary | ICD-10-CM

## 2017-10-19 DIAGNOSIS — R6521 Severe sepsis with septic shock: Secondary | ICD-10-CM

## 2017-10-19 LAB — POCT I-STAT 3, ART BLOOD GAS (G3+)
ACID-BASE DEFICIT: 3 mmol/L — AB (ref 0.0–2.0)
Acid-Base Excess: 2 mmol/L (ref 0.0–2.0)
BICARBONATE: 20.3 mmol/L (ref 20.0–28.0)
Bicarbonate: 26 mmol/L (ref 20.0–28.0)
O2 SAT: 96 %
O2 Saturation: 99 %
PCO2 ART: 35.7 mmHg (ref 32.0–48.0)
PH ART: 7.472 — AB (ref 7.350–7.450)
PO2 ART: 76 mmHg — AB (ref 83.0–108.0)
TCO2: 27 mmol/L (ref 22–32)
TCO2: 21 mmol/L — ABNORMAL LOW (ref 22–32)
pCO2 arterial: 28.7 mmHg — ABNORMAL LOW (ref 32.0–48.0)
pH, Arterial: 7.457 — ABNORMAL HIGH (ref 7.350–7.450)
pO2, Arterial: 119 mmHg — ABNORMAL HIGH (ref 83.0–108.0)

## 2017-10-19 LAB — BASIC METABOLIC PANEL
Anion gap: 11 (ref 5–15)
BUN: 80 mg/dL — AB (ref 6–20)
CHLORIDE: 111 mmol/L (ref 101–111)
CO2: 22 mmol/L (ref 22–32)
Calcium: 7.3 mg/dL — ABNORMAL LOW (ref 8.9–10.3)
Creatinine, Ser: 2.64 mg/dL — ABNORMAL HIGH (ref 0.61–1.24)
GFR calc Af Amer: 28 mL/min — ABNORMAL LOW (ref >60)
GFR calc non Af Amer: 24 mL/min — ABNORMAL LOW (ref >60)
GLUCOSE: 136 mg/dL — AB (ref 65–99)
POTASSIUM: 4 mmol/L (ref 3.5–5.1)
Sodium: 144 mmol/L (ref 135–145)

## 2017-10-19 LAB — CBC WITH DIFFERENTIAL/PLATELET
Basophils Absolute: 0 10*3/uL (ref 0.0–0.1)
Basophils Relative: 0 %
Eosinophils Absolute: 0 10*3/uL (ref 0.0–0.7)
Eosinophils Relative: 0 %
HEMATOCRIT: 24.4 % — AB (ref 39.0–52.0)
Hemoglobin: 7.6 g/dL — ABNORMAL LOW (ref 13.0–17.0)
LYMPHS PCT: 4 %
Lymphs Abs: 0.5 10*3/uL — ABNORMAL LOW (ref 0.7–4.0)
MCH: 30.3 pg (ref 26.0–34.0)
MCHC: 31.1 g/dL (ref 30.0–36.0)
MCV: 97.2 fL (ref 78.0–100.0)
MONO ABS: 0.7 10*3/uL (ref 0.1–1.0)
MONOS PCT: 6 %
NEUTROS ABS: 10.4 10*3/uL — AB (ref 1.7–7.7)
Neutrophils Relative %: 90 %
Platelets: 216 10*3/uL (ref 150–400)
RBC: 2.51 MIL/uL — ABNORMAL LOW (ref 4.22–5.81)
RDW: 19.8 % — AB (ref 11.5–15.5)
WBC: 11.5 10*3/uL — ABNORMAL HIGH (ref 4.0–10.5)

## 2017-10-19 LAB — COOXEMETRY PANEL
Carboxyhemoglobin: 2 % — ABNORMAL HIGH (ref 0.5–1.5)
Methemoglobin: 1 % (ref 0.0–1.5)
O2 Saturation: 64.3 %
Total hemoglobin: 7.5 g/dL — ABNORMAL LOW (ref 12.0–16.0)

## 2017-10-19 LAB — POCT I-STAT, CHEM 8
BUN: 65 mg/dL — ABNORMAL HIGH (ref 6–20)
BUN: 74 mg/dL — ABNORMAL HIGH (ref 6–20)
CHLORIDE: 111 mmol/L (ref 101–111)
CHLORIDE: 110 mmol/L (ref 101–111)
Calcium, Ion: 1.11 mmol/L — ABNORMAL LOW (ref 1.15–1.40)
Calcium, Ion: 1.07 mmol/L — ABNORMAL LOW (ref 1.15–1.40)
Creatinine, Ser: 2.6 mg/dL — ABNORMAL HIGH (ref 0.61–1.24)
Creatinine, Ser: 2.6 mg/dL — ABNORMAL HIGH (ref 0.61–1.24)
Glucose, Bld: 123 mg/dL — ABNORMAL HIGH (ref 65–99)
Glucose, Bld: 151 mg/dL — ABNORMAL HIGH (ref 65–99)
HEMATOCRIT: 21 % — AB (ref 39.0–52.0)
HEMATOCRIT: 23 % — AB (ref 39.0–52.0)
HEMOGLOBIN: 7.1 g/dL — AB (ref 13.0–17.0)
Hemoglobin: 7.8 g/dL — ABNORMAL LOW (ref 13.0–17.0)
POTASSIUM: 4.1 mmol/L (ref 3.5–5.1)
POTASSIUM: 3.8 mmol/L (ref 3.5–5.1)
SODIUM: 146 mmol/L — AB (ref 135–145)
Sodium: 143 mmol/L (ref 135–145)
TCO2: 23 mmol/L (ref 22–32)
TCO2: 22 mmol/L (ref 22–32)

## 2017-10-19 LAB — GLUCOSE, CAPILLARY
GLUCOSE-CAPILLARY: 123 mg/dL — AB (ref 65–99)
GLUCOSE-CAPILLARY: 119 mg/dL — AB (ref 65–99)
GLUCOSE-CAPILLARY: 135 mg/dL — AB (ref 65–99)
Glucose-Capillary: 115 mg/dL — ABNORMAL HIGH (ref 65–99)
Glucose-Capillary: 150 mg/dL — ABNORMAL HIGH (ref 65–99)

## 2017-10-19 LAB — MAGNESIUM: Magnesium: 2.4 mg/dL (ref 1.7–2.4)

## 2017-10-19 LAB — CALCIUM, IONIZED: Calcium, Ionized, Serum: 4.5 mg/dL (ref 4.5–5.6)

## 2017-10-19 LAB — LACTATE DEHYDROGENASE: LDH: 538 U/L — ABNORMAL HIGH (ref 98–192)

## 2017-10-19 LAB — PHOSPHORUS: Phosphorus: 3.7 mg/dL (ref 2.5–4.6)

## 2017-10-19 LAB — PREPARE RBC (CROSSMATCH)

## 2017-10-19 LAB — HEPARIN LEVEL (UNFRACTIONATED): Heparin Unfractionated: 0.3 IU/mL (ref 0.30–0.70)

## 2017-10-19 MED ORDER — SODIUM CHLORIDE 0.9 % IV SOLN
Freq: Once | INTRAVENOUS | Status: AC
  Administered 2017-10-19: 11:00:00 via INTRAVENOUS

## 2017-10-19 MED ORDER — FUROSEMIDE 10 MG/ML IJ SOLN
80.0000 mg | Freq: Two times a day (BID) | INTRAMUSCULAR | Status: AC
  Administered 2017-10-19 (×2): 80 mg via INTRAVENOUS
  Filled 2017-10-19 (×2): qty 8

## 2017-10-19 NOTE — Progress Notes (Signed)
ANTICOAGULATION CONSULT NOTE - Follow Up Consult  Pharmacy Consult for Heparin Indication: atrial fibrillation and LA thrombus  Allergies  Allergen Reactions  . Fentanyl And Related Other (See Comments)    Patients eyes roll in the back of his head and patient has seizure like activity    Patient Measurements: Height: 5\' 10"  (177.8 cm) Weight: 214 lb (97.1 kg) IBW/kg (Calculated) : 73   Vital Signs: Temp: 99.5 F (37.5 C) (02/09 1000) Temp Source: Core (02/09 0400) BP: 87/67 (02/09 1000) Pulse Rate: 107 (02/09 1000)  Labs: Recent Labs    10/17/17 0454  10/17/17 2318 10/18/17 0400 10/18/17 0459 10/18/17 0800 10/18/17 1609 10/19/17 0304 10/19/17 0653  HGB 8.2*   < >  --   --  8.2*  --  7.8* 7.6* 7.1*  HCT 24.3*   < >  --   --  25.5*  --  23.0* 24.4* 21.0*  PLT 115*  --   --   --  164  --   --  216  --   APTT 72*  --   --  98*  --   --   --   --   --   HEPARINUNFRC  --    < > 0.22*  --   --  0.27*  --  0.30  --   CREATININE 2.71*   < >  --  2.49*  --   --  2.60* 2.64* 2.60*   < > = values in this interval not displayed.    Estimated Creatinine Clearance: 33.1 mL/min (A) (by C-G formula based on SCr of 2.6 mg/dL (H)).    Assessment: 66yom known to pharmacy for heparin management with impella. Impella removed 2/6 due to stoppage from small vegetation that got sucked into the catheter. Heparin resumed ~ 4 hours after removal for afib and LA thrombus. Heparin level at goal 0.3 heparin drip 2150 uts/hr. Hgb stable, platelets improving. Chest tube still with bloody drainage but slowing - to water seal.  Goal of Therapy:  Heparin level 0.3 units/ml Monitor platelets by anticoagulation protocol: Yes   Plan:  1) Continue heparin at 2150 units/hr 2) Daily heparin level and CBC  Leota Sauers Pharm.D. CPP, BCPS Clinical Pharmacist [phone] 10/19/2017 10:22 AM

## 2017-10-19 NOTE — Progress Notes (Signed)
10 Days Post-Op Procedure(s) (LRB): PLACEMENT OF IMPELLA 5.0 LEFT VENTRICULAR ASSIST DEVICE (N/A) TRANSESOPHAGEAL ECHOCARDIOGRAM (TEE) (N/A) Subjective: R chest tube with 500 cc output Chest incision clean, dry  Objective: Vital signs in last 24 hours: Temp:  [99.1 F (37.3 C)-101.1 F (38.4 C)] 99.7 F (37.6 C) (02/09 1130) Pulse Rate:  [29-131] 116 (02/09 1130) Cardiac Rhythm: Atrial fibrillation (02/09 0830) Resp:  [12-39] 20 (02/09 1130) BP: (69-122)/(42-92) 93/75 (02/09 1130) SpO2:  [89 %-98 %] 98 % (02/09 1151) Arterial Line BP: (87-117)/(54-72) 96/66 (02/09 1130) FiO2 (%):  [40 %] 40 % (02/09 1151) Weight:  [214 lb (97.1 kg)] 214 lb (97.1 kg) (02/09 0500)  Hemodynamic parameters for last 24 hours: PAP: (34-51)/(16-32) 34/19 CVP:  [3 mmHg-17 mmHg] 10 mmHg  Intake/Output from previous day: 02/08 0701 - 02/09 0700 In: 4860 [I.V.:2160; NG/GT:1900; IV Piggyback:800] Out: 2730 [Urine:1960; Stool:110; Chest Tube:660] Intake/Output this shift: Total I/O In: 421.3 [I.V.:131.3; Blood:200; NG/GT:90] Out: 340 [Urine:230; Chest Tube:110]    Lab Results: Recent Labs    10/18/17 0459  10/19/17 0304 10/19/17 0653  WBC 13.5*  --  11.5*  --   HGB 8.2*   < > 7.6* 7.1*  HCT 25.5*   < > 24.4* 21.0*  PLT 164  --  216  --    < > = values in this interval not displayed.   BMET:  Recent Labs    10/18/17 0400  10/19/17 0304 10/19/17 0653  NA 143   < > 144 143  K 3.5   < > 4.0 4.1  CL 109   < > 111 111  CO2 22  --  22  --   GLUCOSE 166*   < > 136* 123*  BUN 82*   < > 80* 65*  CREATININE 2.49*   < > 2.64* 2.60*  CALCIUM 7.4*  --  7.3*  --    < > = values in this interval not displayed.    PT/INR: No results for input(s): LABPROT, INR in the last 72 hours. ABG    Component Value Date/Time   PHART 7.472 (H) 10/19/2017 0327   HCO3 26.0 10/19/2017 0327   TCO2 23 10/19/2017 0653   ACIDBASEDEF 1.0 10/18/2017 0446   O2SAT 64.3 10/19/2017 0330   CBG (last 3)  Recent  Labs    10/18/17 2354 10/19/17 0318 10/19/17 0808  GLUCAP 109* 115* 123*    Assessment/Plan: S/P Procedure(s) (LRB): PLACEMENT OF IMPELLA 5.0 LEFT VENTRICULAR ASSIST DEVICE (N/A) TRANSESOPHAGEAL ECHOCARDIOGRAM (TEE) (N/A) cont chest tube to drain R effusion due tom chf  cxr in am   LOS: 19 days    Kathlee Nations Trigt III 10/19/2017

## 2017-10-19 NOTE — Progress Notes (Signed)
PULMONARY / CRITICAL CARE MEDICINE   Name: Bryan Harmon MRN: [id number] DOB: October 10, 1950    ADMISSION DATE:  09/28/2017  REFERRING MD:  Nils Pyle  CHIEF COMPLAINT:  Fatigue  HISTORY OF PRESENT ILLNESS:   67 y/o male with non-ischemic cardiomyopathy and afib admitted with decompensated heart failure.  Required Impella, vent.    Lines/ Tubes 1/30 L IJ CVL > 1/31 1/30 R IJ swan > 1/30 R sub clav impella > 10/16/2017 1/30 L radial arterial line > 10/15/2017 1/30 ETT >  1/31 L [place] TLC >  10/15/2017 right femoral arterial line placed>>  Cultures: 1/21 Blood >> Strep Pneumoniae >> pan sens 1/21 Resp Viral Panel >> + Parainfluenza Virus 3 1/28 blood > ng 1/30 blood > ng 1/31 resp culture > ng 2/1 BAL > candida 10/15/2017 blood cultures x2>> 10/15/2017 tracheal aspirate>> 10/15/2017 urine culture>> negative  ABX: Infectious disease consult 10/14/2017 Rocephin 1/21 >> 1/29 Doxy 1/21 >> 1/23 Vanc 1/30 >> 10/14/2017 Mero 1/30 >> 10/15/2017 Eraxis 2/2 >> off Teflaro 10/15/2017>> Flagyl 10/15/2017>>  Studies: 10/01/2017>> Echo EF 15-20%, Apex ? PAP muscle vs. Thrombus, LV severely dilated, moderate concentric hypertrophy, systolic function normal, unable to evaluate LV diastolic function due to atrial fibrillation. + moderate spontaneous echo contrast, indicative of stasis Impression 1/23 Echo > Severely dilated LV with severe LV dysfunction globally with EF 15-20%. Severely dialted RV with severe RV dysfunction. Moderate to severe MR with ERO 0.33cm2 and MR volume 81m. Moderate tosevere TR with moderate pulmonary HTN. Moderately thickened and calcified AV leaflets with mild MR, mildly dilated aortic root, massive biatrial enlargement. Cannot rule out LV thrombus. Thereis significant spontaneous echo contrast in LV c/w sluggish blood flow. The right ventricular systolic pressure was increasedconsistent with moderate pulmonary hypertension. 1/29 CT chest > dense bilateral lower lobe  consolidation R>L 1/30 TEE >> LVEF 20-25%, impella in place, poor overall contractility but LV improved    SUBJECTIVE:   T-max 100.6 off Arctic sun obvious increased work of breathing noted that improved with changing back to pressure control ventilation cardiac index greater than 3 Milrinone drip Amiodarone drip Norepinephrine drip Heparin drip Precedex drip Impella removed on 10/16/2017  VITAL SIGNS: BP (!) 80/57   Pulse (!) 58   Temp 100.2 F (37.9 C)   Resp 18   Ht _0  (1.778 m)   Wt 97.1 kg (214 lb)   SpO2 95%   BMI 30.71 kg/m   HEMODYNAMICS: PAP: (29-51)/(10-32) 39/24 CVP:  [2 mmHg-17 mmHg] 11 mmHg  VENTILATOR SETTINGS: Vent Mode: PCV FiO2 (%):  [40 %] 40 % Set Rate:  [22 bmp] 22 bmp Vt Set:  [540 mL] 540 mL PEEP:  [5 cmH20] 5 cmH20 Plateau Pressure:  [13 cmH20-25 cmH20] 25 cmH20  INTAKE / OUTPUT: I/O last 3 completed shifts: In: 6987.9 [I.V.:3187.9; Other:120; NEH/MC:9470 IV Piggyback:1000] Out: 4485 [Urine:2985; Stool:510; Chest Tube:990]  PHYSICAL EXAMINATION: General: Ill-appearing male on full mechanical ventilatory support who is extremely uncomfortable and tells mode of ventilation was changed back to pressure control ventilation. HEENT: Orotracheal tube to vent, core tract with tube feeding, right IJ PA catheter in place CVP is 5 Pulmonary artery pressures are 39/24 Cardiac index 3.3 Vascular resistance 767 Remains on milrinone amiodarone norepinephrine drips PSY: Neuro: Follows commands at time CV: Sounds are regular PULM: Increased work of breathing at times, decreased breath sounds on right GJG:GEZM non-tender, bsx4 active  Extremities: warm/dry, anasarca edema  Skin: Multiple areas of skin breakdown or pressure point  LABS:  BMET Recent Labs  Lab 10/17/17 0454  10/18/17 0400 10/18/17 1609 10/19/17 0304 10/19/17 0653  NA 139   < > 143 145 144 143  K 3.6   < > 3.5 4.4 4.0 4.1  CL 104   < > 109 109 111 111  CO2 23  --   22  --  22  --   BUN 94*   < > 82* 71* 80* 65*  CREATININE 2.71*   < > 2.49* 2.60* 2.64* 2.60*  GLUCOSE 116*   < > 166* 127* 136* 123*   < > = values in this interval not displayed.    Electrolytes Recent Labs  Lab 10/17/17 0454 10/18/17 0400 10/19/17 0304  CALCIUM 7.4* 7.4* 7.3*  MG 2.3 2.4 2.4  PHOS 4.4 4.0 3.7    CBC Recent Labs  Lab 10/17/17 0454  10/18/17 0459 10/18/17 1609 10/19/17 0304 10/19/17 0653  WBC 17.3*  --  13.5*  --  11.5*  --   HGB 8.2*   < > 8.2* 7.8* 7.6* 7.1*  HCT 24.3*   < > 25.5* 23.0* 24.4* 21.0*  PLT 115*  --  164  --  216  --    < > = values in this interval not displayed.    Coag's Recent Labs  Lab 10/16/17 0404 10/17/17 0454 10/18/17 0400  APTT 82* 72* 98*    Sepsis Markers No results for input(s): LATICACIDVEN, PROCALCITON, O2SATVEN in the last 168 hours.  ABG Recent Labs  Lab 10/17/17 0515 10/18/17 0446 10/19/17 0327  PHART 7.465* 7.416 7.472*  PCO2ART 35.5 36.3 35.7  PO2ART 101 76.0* 119.0*    Liver Enzymes Recent Labs  Lab 10/13/17 0253 10/14/17 0240 10/15/17 1029  AST 65* 63* 68*  ALT 69* 60 44  ALKPHOS 76 69 75  BILITOT 1.2 1.2 1.8*  ALBUMIN 1.8* 1.7* 1.6*    Cardiac Enzymes No results for input(s): TROPONINI, PROBNP in the last 168 hours.  Glucose Recent Labs  Lab 10/18/17 1202 10/18/17 1606 10/18/17 1948 10/18/17 2354 10/19/17 0318 10/19/17 0808  GLUCAP 156* 126* 121* 109* 115* 123*    Imaging Dg Chest Port 1 View  Result Date: 10/19/2017 CLINICAL DATA:  Chest tube placement. EXAM: PORTABLE CHEST 1 VIEW COMPARISON:  Chest x-ray from yesterday. FINDINGS: Unchanged positioning of the endotracheal tube, feeding tube, Swan-Ganz catheter, left subclavian central venous catheter, and right chest tube. Stable moderate cardiomegaly and layering right greater than left pleural effusions with bibasilar atelectasis. No pneumothorax. IMPRESSION: Unchanged layering right greater than left pleural effusions  with bibasilar atelectasis. Stable support lines and apparatus. Electronically Signed   By: Titus Dubin M.D.   On: 10/19/2017 07:40   DISCUSSION: 67 y/o male with ischemic cardiomyopathy admitted with decompensated systolic heart failure in the setting of strep pneumo pneumonia and bacteremia s/p impella.  He has acute on chronic kidney failure, cardiogenic shock.  10/15/2017 with fever 105.6, pancultured 01/2018, ID consult 10/14/2017. 's overall cardiac function has declined despite all interventions.  Family wants to continue with maximum interventions.  10/18/2016 CODE STATUS changed to a limited with no chest compressions but all other interventions will be continued.  Despite his poor prognosis he is continued to improve therefore possibility of tracheostomy may be undertaken within 72 hours. 10/19/2017 no significant change in status.  I do note there is some having been placed on pressure regulated volume control mode of ventilation which was returned to written orders for pressure control ventilation on 10/19/2017.  His hemoglobin is drifting down he may be ready for follow-up transfusions.  ASSESSMENT / PLAN:  PULMONARY A: Acute respiratory failure with hypoxemia Pneumococcal CAP - RLL  Acute pulmonary edema Bilateral pleural effusions , R >> L P:   Attempt PS trials today but doubt will go well, family requesting that. 10/17/2017 he is in obvious respiratory distress with increased work of breathing. Titrate O2 for sat of 88-92% 10/18/2017 discussion with family concerning goals of care.  If he continues to remain stable and/or improved entertain the idea of tracheostomy with the next 72 hours.  Do not think he would tolerate a trial extubation due to his multiple organ dysfunction and severely debilitated state.  Tracheostomy would be the safest route for him.  CARDIOVASCULAR A:  Acute decompensated cardiomyopathy > severe biventricular failure, LVEF ~ 5% S/p Impella 1/30 with removal on  10/16/2017 after failure most likely from vegetation been ingested by the Impella intake. Cardiogenic Shock.  Multiple vasopressor dependent Medical Non-Compliance - stopped taking all meds prior to admit for CHF, ? Long term plan 10/11/2017 right chest tube P:  Volume management, inotrope management as per advanced heart failure team plans, TCTS plans.  Note CVP is 4, milrinone is being weaned on 10/18/2017 Ct levophed/ milrinone gtt Amiodarone drip, if stabilizes consider cardioversion in the future. Poor prognosis but as of 10/18/2017 despite removal of assist device he is continue to maintain cardiac index greater than 3 with a CVP of 4. CT to water seal  RENAL Lab Results  Component Value Date   CREATININE 2.60 (H) 10/19/2017   CREATININE 2.64 (H) 10/19/2017   CREATININE 2.60 (H) 10/18/2017   CREATININE 1.11 03/25/2012   Recent Labs  Lab 10/18/17 1609 10/19/17 0304 10/19/17 0653  K 4.4 4.0 4.1   Recent Labs  Lab 10/18/17 1609 10/19/17 0304 10/19/17 0653  NA 145 144 143   A:   Stable renal failure  Hypokalemia/hypervolemic P:   Continue to follow urine output, BMP Replace electrolytes as indicated Avoid nephrotoxic agents, ensure adequate renal perfusion  GASTROINTESTINAL A:   No acute issues P:   Tube feeding per nutrition Pepcid IV for ulcer prophylaxis  HEMATOLOGIC Recent Labs    10/19/17 0304 10/19/17 0653  HGB 7.6* 7.1*   Lab Results  Component Value Date   INR 1.33 09/17/2017   INR 1.10 07/05/2016   INR 0.98 10/08/2012   A:   Anemia, thrombocytopenia - blood loss via chest tube Concern for hemolytic anemia with impella, note high LDH P:  PRBCs per CHF service, goal Hb 8 & above Transfused 4 units of packed cells 10/14/2017 Follow CBC and transfusion per cardiology service with 2 units of packed cells on 10/15/2017 Remains on heparin Right chest tube intermittently dumps up to 150 cc of bloody drainage. 10/19/2017 dumped 400 cc bloody drainage from  chest tube during the night, hemoglobin noted to be drifting down. Consider transfusion we will leave this to cardiology at this time.  Spoke with cardiologist plans to transfuse.  10/19/2017  INFECTIOUS A:   Strep Pneumonia Bacteremia - 1/21 - completed 10 ds of rocephin Severe CAP HCAP? >  No evidence thus far based on BAL from 2/1 Fever 1/30 - new, considering RLL, sinusitis with NGT, multiple lines Parainfluenza virus pneumonia Persistent right pleural effusion appears complex and could be empyema despite right chest tube 10/19/2017 T-max 101 during the night.  He has been followed by infectious disease no cultures are positive at this time. P:  Vancomycin discontinued on 10/14/2017 Continue meropenem off Infectious disease consult 10/14/2017 appreciated 10/17/2017 T-max 98.4 Currently on Teflaro and Flagyl per ID both  started on 10/15/2017 Suspect that yeast in BAL is a contaminant, empiric  Consider drug fever - ID consulted ,note pct was high Note 10/15/2017 fever was 105.6 he was placed on Arctic sun for cooling. 10/15/2017 due to fever spike multiple invasive catheters we will pan culture 10/15/2017 Spoke with cardiovascular thoracic surgeons 10/16/2017 no indication for further chest tube.  Question of lytic therapy in the future.  Note 2//2019 chest 2 continues to drain approximately 30-50 cc an hour bloody drainage.  10/19/2017 T-max 101 Note: 10/19/2017 was noted to have down to approximately 400 cc via chest tubes during the night.  Hemoglobin remained stable remains on a heparin drip.  ENDOCRINE CBG (last 3)  Recent Labs    10/18/17 2354 10/19/17 0318 10/19/17 0808  GLUCAP 109* 115* 123*   A:   Hyperglycemia  P:   Sliding scale insulin per protocol  NEUROLOGIC A:   Sedation Need / Mechanical Ventilation  Suspected seizure 10/14/2017 neurology consult Suspect he may be secondary to febrile state. EEG performed P:   RASS goal: -1  Sedation protocol: -1> continue precedex, PRN  fentanyl, versed  EEG with no seizure activity noted.  Neurology has signed off as of 10/16/2017 10/19/2017 awake and follows commands no further seizure activity.  FAMILY  - Updates: 10/19/2017 remains limited code with no CPR.  Wife again updated extensively.  App CCT 60 min  Richardson Landry Minor ACNP Maryanna Shape PCCM Pager [phone] till 1 pm If no answer page [phone] 10/19/2017, 9:22 AM  Attending Note:  67 year old male with extensive cardiac PMH who presents to PCCM with cardiogenic shock, respiratory failure and renal failure.  Patient is still requiring very high doses of pressors on exam and is not weaning well on exam.  Diffuse crackles noted on exam.  I reviewed CXR myself, ETT is in good position.  No family bedside this AM.  Patient is not improving.  Will continue abx for now.  Continue full vent support.  PCV adjusted for vent asynchrony.  PCCM will continue to follow.  I do not believe patient is a trach candidate in his current condition given deteriorating hemodynamics.  The patient is critically ill with multiple organ systems failure and requires high complexity decision making for assessment and support, frequent evaluation and titration of therapies, application of advanced monitoring technologies and extensive interpretation of multiple databases.   Critical Care Time devoted to patient care services described in this note is  35  Minutes. This time reflects time of care of this signee Dr Jennet Maduro. This critical care time does not reflect procedure time, or teaching time or supervisory time of PA/NP/Med student/Med Resident etc but could involve care discussion time.  Rush Farmer, M.D. [facility] Pulmonary/Critical Care Medicine. Pager: [phone]. After hours pager: [phone].

## 2017-10-19 NOTE — Progress Notes (Signed)
Patient ID: Bryan Harmon, male   DOB: 1950-10-15, 67 y.o.   MRN: [id number]     Advanced Heart Failure Rounding Note  PCP:  Primary Cardiologist: Aundra Dubin   Subjective:    Impella 5.0 placed in the OR 1/30.  Chest tube placed on right 1/31.   2/4 ID consulted for ongoing fever. 2/4 Neuro consulted for ?seizure. CT of head with no acute findings. EEG without seizure activity.  Echo for Impella placement on 2/5 showed a small vegetation on a mitral valve chord.   2/5 CT of chest/abd/pelvis: Worsening pulmonary infiltrates.  Necrotizing PNA on right with complex pleural effusion. Also with aggressive left base PNA.    2/6 Impella removed due to blockage => suspect small vegetation on MV chord was drawn into the Impella, stopping the device.   On amio 30 mg/hr, milrinone 0.375 mcg, norepi 17 mcg. Remains intubated.    Fever to 101 last night.    Serosanguinous drainage from right chest tube.   Creatinine 2.2>2.5 >2.6>2.7>2.49 > 2.6  [place]wan: CVP 11-12 PA 39/24 CI 3.5   Co-ox 64%.   Echo: Severe LV dilation, EF 15-20%, severely dilated RV with severely decreased RV systolic function, moderate-severe Bryan, moderate-severe TR, cannot rule out LV apical thrombus.  Repeat echo with Definity showed definite LV apical thrombus.   TEE (1/28): EF 10-15%, diffuse hypokinesis, small LV apical thrombus, mildly dilated RV with moderately decreased systolic function, no LAA thrombus.    Objective:   Weight Range: 214 lb (97.1 kg) Body mass index is 30.71 kg/m.   Vital Signs:   Temp:  [99.1 F (37.3 C)-101.3 F (38.5 C)] 100.2 F (37.9 C) (02/09 0830) Pulse Rate:  [29-133] 58 (02/09 0830) Resp:  [13-39] 18 (02/09 0830) BP: (69-122)/(42-92) 80/57 (02/09 0830) SpO2:  [89 %-98 %] 95 % (02/09 0830) Arterial Line BP: (84-117)/(44-72) 89/58 (02/09 0830) FiO2 (%):  [40 %] 40 % (02/09 0904) Weight:  [214 lb (97.1 kg)] 214 lb (97.1 kg) (02/09 0500) Last BM Date: 10/18/17  Weight  change: Filed Weights   10/17/17 0400 10/18/17 0600 10/19/17 0500  Weight: 215 lb (97.5 kg) 219 lb (99.3 kg) 214 lb (97.1 kg)    Intake/Output:   Intake/Output Summary (Last 24 hours) at 10/19/2017 0952 Last data filed at 10/19/2017 0830 Gross per 24 hour  Intake 4687.5 ml  Output 2725 ml  Net 1962.5 ml      Physical Exam  CVP 11-12  General: Intubated Neck: JVP 12 cm, no thyromegaly or thyroid nodule.  Lungs: Decreased breath sounds at bases CV: Lateral PMI.  Heart mildly tachy, irregular S1/S2, no S3/S4, 2/6 HSM apex.  1+ edema to knees bilaterally.  1+ edema of arms.  Abdomen: Soft, nontender, no hepatosplenomegaly, no distention.  Skin: Intact without lesions or rashes.  Neurologic: Wakes up and follows commands Extremities: No clubbing or cyanosis.  HEENT: Normal.   Telemetry  A fib 100s, personally reviewed   EKG    No new tracings.    Labs    CBC Recent Labs    10/18/17 0459  10/19/17 0304 10/19/17 0653  WBC 13.5*  --  11.5*  --   NEUTROABS 12.5*  --  10.4*  --   HGB 8.2*   < > 7.6* 7.1*  HCT 25.5*   < > 24.4* 21.0*  MCV 94.4  --  97.2  --   PLT 164  --  216  --    < > = values in this interval  not displayed.   Basic Metabolic Panel Recent Labs    10/18/17 0400  10/19/17 0304 10/19/17 0653  NA 143   < > 144 143  K 3.5   < > 4.0 4.1  CL 109   < > 111 111  CO2 22  --  22  --   GLUCOSE 166*   < > 136* 123*  BUN 82*   < > 80* 65*  CREATININE 2.49*   < > 2.64* 2.60*  CALCIUM 7.4*  --  7.3*  --   MG 2.4  --  2.4  --   PHOS 4.0  --  3.7  --    < > = values in this interval not displayed.   Liver Function Tests No results for input(s): AST, ALT, ALKPHOS, BILITOT, PROT, ALBUMIN in the last 72 hours. No results for input(s): LIPASE, AMYLASE in the last 72 hours. Cardiac Enzymes No results for input(s): CKTOTAL, CKMB, CKMBINDEX, TROPONINI in the last 72 hours.  BNP: BNP (last 3 results) Recent Labs    12/13/16 0949 02/11/17 1049 09/11/2017 1218   BNP 369.2* 562.7* 2,485.9*    ProBNP (last 3 results) No results for input(s): PROBNP in the last 8760 hours.   D-Dimer No results for input(s): DDIMER in the last 72 hours. Hemoglobin A1C No results for input(s): HGBA1C in the last 72 hours. Fasting Lipid Panel No results for input(s): CHOL, HDL, LDLCALC, TRIG, CHOLHDL, LDLDIRECT in the last 72 hours. Thyroid Function Tests No results for input(s): TSH, T4TOTAL, T3FREE, THYROIDAB in the last 72 hours.  Invalid input(s): FREET3  Other results:   Imaging    Dg Chest Port 1 View  Result Date: 10/19/2017 CLINICAL DATA:  Chest tube placement. EXAM: PORTABLE CHEST 1 VIEW COMPARISON:  Chest x-ray from yesterday. FINDINGS: Unchanged positioning of the endotracheal tube, feeding tube, Swan-Ganz catheter, left subclavian central venous catheter, and right chest tube. Stable moderate cardiomegaly and layering right greater than left pleural effusions with bibasilar atelectasis. No pneumothorax. IMPRESSION: Unchanged layering right greater than left pleural effusions with bibasilar atelectasis. Stable support lines and apparatus. Electronically Signed   By: Titus Dubin M.D.   On: 10/19/2017 07:40     Medications:     Scheduled Medications: . chlorhexidine gluconate (MEDLINE KIT)  15 mL Mouth Rinse BID  . Chlorhexidine Gluconate Cloth  6 each Topical Daily  . feeding supplement (PRO-STAT SUGAR FREE 64)  30 mL Per Tube TID  . furosemide  80 mg Intravenous BID  . Gerhardt's butt cream   Topical BID  . insulin aspart  2-6 Units Subcutaneous Q4H  . insulin glargine  12 Units Subcutaneous BID  . mouth rinse  15 mL Mouth Rinse 10 times per day  . sennosides  5 mL Oral Daily  . sodium chloride flush  3 mL Intravenous Q12H    Infusions: . sodium chloride 20 mL/hr at 10/15/17 0608  . sodium chloride    . amiodarone 30 mg/hr (10/19/17 0754)  . ceFTAROline (TEFLARO) IV Stopped (10/19/17 0138)  . dexmedetomidine (PRECEDEX) IV infusion  for high rates 1 mcg/kg/hr (10/19/17 0910)  . famotidine (PEPCID) IV Stopped (10/18/17 0949)  . feeding supplement (VITAL 1.5 CAL) 1,000 mL (10/19/17 0830)  . heparin 2,150 Units/hr (10/19/17 0421)  . metronidazole Stopped (10/19/17 0425)  . milrinone 0.25 mcg/kg/min (10/19/17 0947)  . norepinephrine (LEVOPHED) Adult infusion 17 mcg/min (10/19/17 0909)    PRN Medications: sodium chloride, acetaminophen (TYLENOL) oral liquid 160 mg/5 mL, albuterol, magic mouthwash,  midazolam, morphine injection, ondansetron (ZOFRAN) IV, sodium chloride flush, sodium chloride flush    Patient Profile  Bryan Harmon is a 67 year old with a history of PAF S/P DC-CV 11/2016, s/p bilateral inguinal hernia repair 11/2017, PVCs, HTN, NICM, chronic systolic heart failure.   Sent from Urgent Care with A fib RVR. Acutely SOB on arrival   Assessment/Plan   1. Acute hypoxemic respiratory failure: RLL PNA, Strep pneumoniae in blood cultures. Respiratory cultures with parainfluenzae virus. Also acute/chronic systolic CHF.Now intubated s/p Impella 5.0 placement, multifocal PNA on CXR and CT chest. Remains critically ill.  Antibiotics broadened from ceftriaxone to vancomycin/meropenem on 1/30. BAL with Candida albicans, anidulafungin started 2/2.  Necrotizing PNA with complex pleural effusion on right on 2/5 CT, anti-microbials changed to ceftaroline and Flagyl on 2/5. Has had PSV trials daily.   - Right chest tube placed 1/31, still with serosanguineous drainage. - Will likely need tracheostomy early next week.   2.Acute on chronic systolic CHF-> cardiogenic shock : Nonischemic cardiomyopathy. 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 Etiology of his CHF is not clear =>no definite inciting event. He has a history of HTN but doubt this was the only trigger. 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 This would ideally be confirmed by cMRI, but  he has not wanted an MRI (concerned about side effects). He also has frequent PVCs, 21% total on last holter in 4/18 which is a risk for fall in EF.No family history of CMP. Cannot rule out viral myocarditis. SPEP negative. With medical management, he initially felt a lot better. However, he quit all his meds in early 2018 with recurrence of NYHA III symptoms and onset of atrial fibrillation.He had TEE-DCCV in 3/18.TEE showed that EF remained25%. He quit his meds again in 4/18 and apparently did not restart them when I asked him to in 6/18. No meds probably since 4/18. Echo this admission with EF 15-20%, severe RV dysfunction, LV apical thrombus. RV is hypokinetic.  Attempted DCCV on 1/28 failed likely due to pressors/inotropes.  [place] 1/29 with low cardiac output => Impella 5.0 placed 1/30. On 2/6, Impella removed due to pump stop (suspect vegetation from MV chord sucked into Impella).  CO-OX 64%. CI 3.3  CVP up to 11-12.  On milrinone 0.375 and norepinephrine 17.  Suspect combination of septic/vasogenic shock and cardiogenic shock.  - Lasix 80 mg IV bid x 2 doses today.  - Decrease milrinone to 0.25 with good cardiac output, wean norepinephrine as BP allows.  - End point difficult to envision => would be poor LVAD candidate with renal failure and RV failure.   3. Atrial fibrillation:Persistent, now with RVR. HR up to 140sinitially, now 100s on amiodarone gtt at30. Not sure how long he has been in atrial fibrillation with RVR, but this may have triggered his CHF and AKI due to worsening of cardiac output. -Heparin gtt ongoing. - Continue amiodarone gtt at 30 mg/hr.   - Failed TEE-guided DCCV on 1/28 in setting of vasoactive meds.  If we can wean down milrinone/norepinephrine, will re-attempt DCCV in future.   4. AKI: Creatinine 1.08 in 6/18, he has not been on any meds. Suspect cardiorenal syndrome with afib/RVR and fall in cardiac output.He was initially hyperkalemic and  acidotic.Creatinine has been fairly stable recently in 2.6 range. 5. [place]:PXTGG pneumo PNA as well as parainfluenza virus.CT chest 1/29 with multifocal PNA. On  1/30 to vancomycin/meropenem, then anidulafungin added.  ID has seen. Now on Teflaro and metronidazole. CT of chest/abd/pelvis 2/5 concerning for necrotizing right pneumonia with surrounding complex large pleural effusion.  He has a chest tube with ongoing serosanguineous drainage. 2/5 showed a small vegetation on a mitral valve chord.  Repeat ECHO on 2/6 did not show vegetation. Assumed it was pulled into Impella.  He is now off [employer] but febrile to 101 again last night.  - Continue current antibiotics, so far additional culture negative.  WBCs trending down.  - If fever persists, will ask ID to see again.  6. Elevated LFTs: Suspect shock liver type picture. - AST/ALT > 1000 at admission but have trended down steadily.  7. LV thrombus: Noted on TTE and TEE, small.  - Remains on heparin.   8. Hyponatremia: resolved.  9. Malnutrition: tube feeds.    10. Anemia: Significant blood loss via chest tube and mouth.   Received 2UPRBCs 2/3, 2/4 5 uPRBCs, 2/5 2UPRBCs. 2/6 2U PRBCs. Hgb down a bit to 7.6 today.  - Will give 1 unit PRBCs today.   11. Suspected Deep Tissue Injury -->Sacrum/R buttock/Mid back . Continue to repostion R/L. WOC consult appreciated.   12. Seizure- Neurology appreciated. CT head negative. EEG no seizure noted.    Long-term prognosis remains poor with multisystem organ failure. Have discussed poor prognosiswith family, they want to continue aggressive treatment for the time being.  He is stable today but still requiring a lot of support with no end-point at this time.He is limited code (no CPR but ok to shock and use drugs).   CRITICAL CARE Performed by: Loralie Champagne  Total critical care time: 35 minutes  Critical care time was exclusive of separately billable procedures and treating other  patients.  Critical care was necessary to treat or prevent imminent or life-threatening deterioration.  Critical care was time spent personally by me on the following activities: development of treatment plan with patient and/or surrogate as well as nursing, discussions with consultants, evaluation of patient's response to treatment, examination of patient, obtaining history from patient or surrogate, ordering and performing treatments and interventions, ordering and review of laboratory studies, ordering and review of radiographic studies, pulse oximetry and re-evaluation of patient's condition.  Loralie Champagne 10/19/2017 9:52 AM

## 2017-10-20 ENCOUNTER — Inpatient Hospital Stay (HOSPITAL_COMMUNITY): Payer: Medicare Other

## 2017-10-20 DIAGNOSIS — Z885 Allergy status to narcotic agent status: Secondary | ICD-10-CM

## 2017-10-20 DIAGNOSIS — Z8701 Personal history of pneumonia (recurrent): Secondary | ICD-10-CM

## 2017-10-20 DIAGNOSIS — R011 Cardiac murmur, unspecified: Secondary | ICD-10-CM

## 2017-10-20 DIAGNOSIS — Z8249 Family history of ischemic heart disease and other diseases of the circulatory system: Secondary | ICD-10-CM

## 2017-10-20 DIAGNOSIS — Z933 Colostomy status: Secondary | ICD-10-CM

## 2017-10-20 DIAGNOSIS — Z8619 Personal history of other infectious and parasitic diseases: Secondary | ICD-10-CM

## 2017-10-20 LAB — TYPE AND SCREEN
ABO/RH(D): O POS
Antibody Screen: NEGATIVE
Unit division: 0
Unit division: 0
Unit division: 0

## 2017-10-20 LAB — CBC WITH DIFFERENTIAL/PLATELET
Basophils Absolute: 0 10*3/uL (ref 0.0–0.1)
Basophils Relative: 0 %
EOS ABS: 0 10*3/uL (ref 0.0–0.7)
Eosinophils Relative: 0 %
HCT: 26.8 % — ABNORMAL LOW (ref 39.0–52.0)
HEMOGLOBIN: 8.4 g/dL — AB (ref 13.0–17.0)
LYMPHS ABS: 0.7 10*3/uL (ref 0.7–4.0)
LYMPHS PCT: 8 %
MCH: 30.3 pg (ref 26.0–34.0)
MCHC: 31.3 g/dL (ref 30.0–36.0)
MCV: 96.8 fL (ref 78.0–100.0)
Monocytes Absolute: 0.3 10*3/uL (ref 0.1–1.0)
Monocytes Relative: 3 %
NEUTROS PCT: 89 %
Neutro Abs: 8.2 10*3/uL — ABNORMAL HIGH (ref 1.7–7.7)
Platelets: 265 10*3/uL (ref 150–400)
RBC: 2.77 MIL/uL — AB (ref 4.22–5.81)
RDW: 20.3 % — ABNORMAL HIGH (ref 11.5–15.5)
WBC: 9.2 10*3/uL (ref 4.0–10.5)

## 2017-10-20 LAB — LACTATE DEHYDROGENASE: LDH: 484 U/L — ABNORMAL HIGH (ref 98–192)

## 2017-10-20 LAB — C DIFFICILE QUICK SCREEN W PCR REFLEX
C DIFFICILE (CDIFF) INTERP: NOT DETECTED
C DIFFICILE (CDIFF) TOXIN: NEGATIVE
C DIFFICLE (CDIFF) ANTIGEN: NEGATIVE

## 2017-10-20 LAB — GLUCOSE, CAPILLARY
GLUCOSE-CAPILLARY: 127 mg/dL — AB (ref 65–99)
GLUCOSE-CAPILLARY: 98 mg/dL (ref 65–99)
Glucose-Capillary: 107 mg/dL — ABNORMAL HIGH (ref 65–99)
Glucose-Capillary: 111 mg/dL — ABNORMAL HIGH (ref 65–99)
Glucose-Capillary: 123 mg/dL — ABNORMAL HIGH (ref 65–99)
Glucose-Capillary: 137 mg/dL — ABNORMAL HIGH (ref 65–99)

## 2017-10-20 LAB — POCT I-STAT 3, ART BLOOD GAS (G3+)
ACID-BASE DEFICIT: 4 mmol/L — AB (ref 0.0–2.0)
Bicarbonate: 19.9 mmol/L — ABNORMAL LOW (ref 20.0–28.0)
O2 Saturation: 96 %
PO2 ART: 89 mmHg (ref 83.0–108.0)
Patient temperature: 37.9
TCO2: 21 mmol/L — ABNORMAL LOW (ref 22–32)
pCO2 arterial: 33.3 mmHg (ref 32.0–48.0)
pH, Arterial: 7.388 (ref 7.350–7.450)

## 2017-10-20 LAB — BASIC METABOLIC PANEL
ANION GAP: 10 (ref 5–15)
BUN: 93 mg/dL — ABNORMAL HIGH (ref 6–20)
CO2: 21 mmol/L — AB (ref 22–32)
Calcium: 7.3 mg/dL — ABNORMAL LOW (ref 8.9–10.3)
Chloride: 113 mmol/L — ABNORMAL HIGH (ref 101–111)
Creatinine, Ser: 2.91 mg/dL — ABNORMAL HIGH (ref 0.61–1.24)
GFR calc non Af Amer: 21 mL/min — ABNORMAL LOW (ref >60)
GFR, EST AFRICAN AMERICAN: 25 mL/min — AB (ref >60)
Glucose, Bld: 139 mg/dL — ABNORMAL HIGH (ref 65–99)
Potassium: 3.8 mmol/L (ref 3.5–5.1)
Sodium: 144 mmol/L (ref 135–145)

## 2017-10-20 LAB — BPAM RBC
BLOOD PRODUCT EXPIRATION DATE: 201902152359
BLOOD PRODUCT EXPIRATION DATE: 201902252359
Blood Product Expiration Date: 201903012359
ISSUE DATE / TIME: 201902061303
ISSUE DATE / TIME: 201902091021
ISSUE DATE / TIME: 201902061059
UNIT TYPE AND RH: 5100
Unit Type and Rh: 5100
Unit Type and Rh: 5100

## 2017-10-20 LAB — COOXEMETRY PANEL
Carboxyhemoglobin: 1.7 % — ABNORMAL HIGH (ref 0.5–1.5)
Methemoglobin: 0.9 % (ref 0.0–1.5)
O2 Saturation: 53.6 %
Total hemoglobin: 10.3 g/dL — ABNORMAL LOW (ref 12.0–16.0)

## 2017-10-20 LAB — POCT I-STAT, CHEM 8
BUN: 85 mg/dL — AB (ref 6–20)
CALCIUM ION: 1.11 mmol/L — AB (ref 1.15–1.40)
Chloride: 110 mmol/L (ref 101–111)
Creatinine, Ser: 3 mg/dL — ABNORMAL HIGH (ref 0.61–1.24)
GLUCOSE: 126 mg/dL — AB (ref 65–99)
HCT: 24 % — ABNORMAL LOW (ref 39.0–52.0)
Hemoglobin: 8.2 g/dL — ABNORMAL LOW (ref 13.0–17.0)
Potassium: 4 mmol/L (ref 3.5–5.1)
Sodium: 146 mmol/L — ABNORMAL HIGH (ref 135–145)
TCO2: 23 mmol/L (ref 22–32)

## 2017-10-20 LAB — CULTURE, BLOOD (ROUTINE X 2)
Culture: NO GROWTH
Culture: NO GROWTH
SPECIAL REQUESTS: ADEQUATE
Special Requests: ADEQUATE

## 2017-10-20 LAB — MAGNESIUM: Magnesium: 2.3 mg/dL (ref 1.7–2.4)

## 2017-10-20 LAB — HEPARIN LEVEL (UNFRACTIONATED): Heparin Unfractionated: 0.32 IU/mL (ref 0.30–0.70)

## 2017-10-20 LAB — CALCIUM, IONIZED: Calcium, Ionized, Serum: 4.5 mg/dL (ref 4.5–5.6)

## 2017-10-20 LAB — PHOSPHORUS: PHOSPHORUS: 4.5 mg/dL (ref 2.5–4.6)

## 2017-10-20 MED ORDER — FUROSEMIDE 10 MG/ML IJ SOLN
80.0000 mg | Freq: Two times a day (BID) | INTRAMUSCULAR | Status: AC
Start: 2017-10-20 — End: 2017-10-20
  Administered 2017-10-20 (×2): 80 mg via INTRAVENOUS
  Filled 2017-10-20 (×2): qty 8

## 2017-10-20 NOTE — Progress Notes (Signed)
Right IJ introducer became minimally dislodged when swan ganz catheter removed due to no sutures in place securing right IJ introducer.  Chest xray ordered to confirm placement stating right IJ introducer in right innominate vein.  Spoke with MD [name], MD [name] advised right IJ introducer is okay to use.  MD [name] advised if additional access is not needed, RN can discontinue right IJ introducer.

## 2017-10-20 NOTE — Progress Notes (Signed)
ANTICOAGULATION CONSULT NOTE - Follow Up Consult  Pharmacy Consult for Heparin Indication: atrial fibrillation and LA thrombus  Allergies  Allergen Reactions  . Fentanyl And Related Other (See Comments)    Patients eyes roll in the back of his head and patient has seizure like activity    Patient Measurements: Height: 5\' 10"  (177.8 cm) Weight: 203 lb (92.1 kg) IBW/kg (Calculated) : 73   Vital Signs: Temp: 100.8 F (38.2 C) (02/10 1030) Temp Source: Core (02/10 0730) BP: 87/50 (02/10 1030) Pulse Rate: 125 (02/10 1030)  Labs: Recent Labs    10/18/17 0400 10/18/17 0459 10/18/17 0800  10/19/17 0304 10/19/17 0653 10/19/17 1719 10/20/17 0230  HGB  --  8.2*  --    < > 7.6* 7.1* 7.8* 8.4*  HCT  --  25.5*  --    < > 24.4* 21.0* 23.0* 26.8*  PLT  --  164  --   --  216  --   --  265  APTT 98*  --   --   --   --   --   --   --   HEPARINUNFRC  --   --  0.27*  --  0.30  --   --  0.32  CREATININE 2.49*  --   --    < > 2.64* 2.60* 2.60* 2.91*   < > = values in this interval not displayed.    Estimated Creatinine Clearance: 28.9 mL/min (A) (by C-G formula based on SCr of 2.91 mg/dL (H)).    Assessment: 66yom known to pharmacy for heparin management with impella. Impella removed 2/6 due to stoppage from small vegetation that got sucked into the catheter. Heparin resumed ~ 4 hours after removal for afib and LA thrombus. Heparin level at goal 0.3 heparin drip 2150 uts/hr. Hgb stable today after prbc 2/9 watch, platelets improving. Chest tube still with bloody drainage but slowing   Goal of Therapy:  Heparin level 0.3 units/ml Monitor platelets by anticoagulation protocol: Yes   Plan:  1) Continue heparin at 2150 units/hr 2) Daily heparin level and CBC  Leota Sauers Pharm.D. CPP, BCPS Clinical Pharmacist [phone] 10/20/2017 11:10 AM

## 2017-10-20 NOTE — Progress Notes (Signed)
RT note- Called for ETT pulled 3cm out by patient, re inserted back to 23cm, good BBS, and good volumes.

## 2017-10-20 NOTE — Progress Notes (Signed)
Patient ID: Bryan Harmon, male   DOB: October 03, 1950, 67 y.o.   MRN: [id number]     Advanced Heart Failure Rounding Note  PCP:  Primary Cardiologist: Aundra Dubin   Subjective:    Impella 5.0 placed in the OR 1/30.  Chest tube placed on right 1/31.   2/4 ID consulted for ongoing fever. 2/4 Neuro consulted for ?seizure. CT of head with no acute findings. EEG without seizure activity.  Echo for Impella placement on 2/5 showed a small vegetation on a mitral valve chord.   2/5 CT of chest/abd/pelvis: Worsening pulmonary infiltrates.  Necrotizing PNA on right with complex pleural effusion. Also with aggressive left base PNA.    2/6 Impella removed due to blockage => suspect small vegetation on MV chord was drawn into the Impella, stopping the device.   On amio 30 mg/hr, milrinone 0.375 mcg, norepi 2 mcg. Remains intubated.    Tm 100.5 last night.    Serosanguinous drainage from right chest tube.   Creatinine 2.2>2.5 >2.6>2.7>2.49 > 2.6 > 2.9  [place]: CVP 14-15 PA 36/24 CI 3.0   Co-ox 55%.   Echo: Severe LV dilation, EF 15-20%, severely dilated RV with severely decreased RV systolic function, moderate-severe Bryan, moderate-severe TR, cannot rule out LV apical thrombus.  Repeat echo with Definity showed definite LV apical thrombus.   TEE (1/28): EF 10-15%, diffuse hypokinesis, small LV apical thrombus, mildly dilated RV with moderately decreased systolic function, no LAA thrombus.    Objective:   Weight Range: 203 lb (92.1 kg) Body mass index is 29.13 kg/m.   Vital Signs:   Temp:  [99.3 F (37.4 C)-100.9 F (38.3 C)] 100.4 F (38 C) (02/10 0845) Pulse Rate:  [30-127] 65 (02/10 0800) Resp:  [7-23] 19 (02/10 0845) BP: (81-113)/(58-90) 87/70 (02/10 0830) SpO2:  [90 %-100 %] 96 % (02/10 0914) Arterial Line BP: (88-111)/(54-73) 104/62 (02/10 0845) FiO2 (%):  [40 %] 40 % (02/10 0825) Weight:  [203 lb (92.1 kg)] 203 lb (92.1 kg) (02/10 0620) Last BM Date: 10/20/17  Weight  change: Filed Weights   10/18/17 0600 10/19/17 0500 10/20/17 0620  Weight: 219 lb (99.3 kg) 214 lb (97.1 kg) 203 lb (92.1 kg)    Intake/Output:   Intake/Output Summary (Last 24 hours) at 10/20/2017 0959 Last data filed at 10/20/2017 0930 Gross per 24 hour  Intake 5166.13 ml  Output 3717 ml  Net 1449.13 ml      Physical Exam  CVP 14-15  General: NAD, intubated Neck: JVP 12 cm, no thyromegaly or thyroid nodule.  Lungs: Decreased breath sounds at bases.  CV: Nondisplaced PMI.  Heart irregular S1/S2, no S3/S4, 2/6 HSM apex.  1+ edema to knees and 1+ edema of arms.   Abdomen: Soft, nontender, no hepatosplenomegaly, no distention.  Skin: Intact without lesions or rashes.  Neurologic: Awake, follows commands  Extremities: No clubbing or cyanosis.  HEENT: Normal.    Telemetry  A fib 110s-120s, personally reviewed   EKG    No new tracings.    Labs    CBC Recent Labs    10/19/17 0304  10/19/17 1719 10/20/17 0230  WBC 11.5*  --   --  9.2  NEUTROABS 10.4*  --   --  8.2*  HGB 7.6*   < > 7.8* 8.4*  HCT 24.4*   < > 23.0* 26.8*  MCV 97.2  --   --  96.8  PLT 216  --   --  265   < > = values in this  interval not displayed.   Basic Metabolic Panel Recent Labs    10/19/17 0304  10/19/17 1719 10/20/17 0230  NA 144   < > 146* 144  K 4.0   < > 3.8 3.8  CL 111   < > 110 113*  CO2 22  --   --  21*  GLUCOSE 136*   < > 151* 139*  BUN 80*   < > 74* 93*  CREATININE 2.64*   < > 2.60* 2.91*  CALCIUM 7.3*  --   --  7.3*  MG 2.4  --   --  2.3  PHOS 3.7  --   --  4.5   < > = values in this interval not displayed.   Liver Function Tests No results for input(s): AST, ALT, ALKPHOS, BILITOT, PROT, ALBUMIN in the last 72 hours. No results for input(s): LIPASE, AMYLASE in the last 72 hours. Cardiac Enzymes No results for input(s): CKTOTAL, CKMB, CKMBINDEX, TROPONINI in the last 72 hours.  BNP: BNP (last 3 results) Recent Labs    12/13/16 0949 02/11/17 1049 09/15/2017 1218  BNP  369.2* 562.7* 2,485.9*    ProBNP (last 3 results) No results for input(s): PROBNP in the last 8760 hours.   D-Dimer No results for input(s): DDIMER in the last 72 hours. Hemoglobin A1C No results for input(s): HGBA1C in the last 72 hours. Fasting Lipid Panel No results for input(s): CHOL, HDL, LDLCALC, TRIG, CHOLHDL, LDLDIRECT in the last 72 hours. Thyroid Function Tests No results for input(s): TSH, T4TOTAL, T3FREE, THYROIDAB in the last 72 hours.  Invalid input(s): FREET3  Other results:   Imaging    Dg Chest Port 1 View  Result Date: 10/20/2017 CLINICAL DATA:  Replacement of endotracheal tube after patient pulled out EXAM: PORTABLE CHEST 1 VIEW COMPARISON:  10/20/2017 and prior radiographs FINDINGS: Cardiomegaly again noted. An endotracheal tube with tip 2 cm above the carina, small bore feeding tube entering the stomach with tip off the field of view, right IJ Swan-Ganz catheter with tip overlying the right main pulmonary artery, left subclavian central venous catheter with tip overlying the upper SVC and right thoracostomy tube again noted. Pulmonary vascular congestion, pleural effusions, and bilateral lower lung consolidation/atelectasis are unchanged. A miniscule right apical pneumothorax is noted. IMPRESSION: 1. Little significant change in appearance of the chest except for miniscule right apical pneumothorax. Electronically Signed   By: Margarette Canada M.D.   On: 10/20/2017 09:35   Dg Chest Port 1 View  Result Date: 10/20/2017 CLINICAL DATA:  Respiratory failure EXAM: PORTABLE CHEST 1 VIEW COMPARISON:  10/19/2017 FINDINGS: Right chest tube and endotracheal tube as well as Swan-Ganz catheter and feeding tube are unchanged. Cardiomegaly with vascular congestion. Bilateral lower lobe airspace opacities and layering effusions, unchanged. No pneumothorax. IMPRESSION: No significant change since prior study. Electronically Signed   By: Rolm Baptise M.D.   On: 10/20/2017 07:22      Medications:     Scheduled Medications: . chlorhexidine gluconate (MEDLINE KIT)  15 mL Mouth Rinse BID  . Chlorhexidine Gluconate Cloth  6 each Topical Daily  . feeding supplement (PRO-STAT SUGAR FREE 64)  30 mL Per Tube TID  . furosemide  80 mg Intravenous BID  . Gerhardt's butt cream   Topical BID  . insulin aspart  2-6 Units Subcutaneous Q4H  . insulin glargine  12 Units Subcutaneous BID  . mouth rinse  15 mL Mouth Rinse 10 times per day  . sennosides  5 mL Oral Daily  .  sodium chloride flush  3 mL Intravenous Q12H    Infusions: . sodium chloride 20 mL/hr at 10/15/17 0608  . amiodarone 30 mg/hr (10/20/17 0858)  . ceFTAROline (TEFLARO) IV Stopped (10/20/17 0110)  . dexmedetomidine (PRECEDEX) IV infusion for high rates 1 mcg/kg/hr (10/20/17 0930)  . famotidine (PEPCID) IV 20 mg (10/20/17 0941)  . feeding supplement (VITAL 1.5 CAL) 1,000 mL (10/19/17 2254)  . heparin 2,150 Units/hr (10/20/17 0656)  . metronidazole Stopped (10/20/17 0401)  . milrinone 0.25 mcg/kg/min (10/19/17 2207)  . norepinephrine (LEVOPHED) Adult infusion Stopped (10/20/17 0951)    PRN Medications: sodium chloride, acetaminophen (TYLENOL) oral liquid 160 mg/5 mL, albuterol, magic mouthwash, midazolam, morphine injection, ondansetron (ZOFRAN) IV, sodium chloride flush, sodium chloride flush    Patient Profile  Bryan Harmon is a 67 year old with a history of PAF S/P DC-CV 11/2016, s/p bilateral inguinal hernia repair 11/2017, PVCs, HTN, NICM, chronic systolic heart failure.   Sent from Urgent Care with A fib RVR. Acutely SOB on arrival   Assessment/Plan   1. Acute hypoxemic respiratory failure: RLL PNA, Strep pneumoniae in blood cultures. Respiratory cultures with parainfluenzae virus. Also acute/chronic systolic CHF.Now intubated s/p Impella 5.0 placement, multifocal PNA on CXR and CT chest. Remains critically ill.  Antibiotics broadened from ceftriaxone to vancomycin/meropenem on 1/30. BAL with  Candida albicans, anidulafungin started 2/2.  Necrotizing PNA with complex pleural effusion on right on 2/5 CT, anti-microbials changed to ceftaroline and Flagyl on 2/5. Has had PSV trials daily.   - Right chest tube placed 1/31, still with serosanguineous drainage. - Will need tracheostomy.    2.Acute on chronic systolic CHF-> cardiogenic shock : Nonischemic cardiomyopathy. 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 Etiology of his CHF is not clear =>no definite inciting event. He has a history of HTN but doubt this was the only trigger. 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 This would ideally be confirmed by cMRI, but he has not wanted an MRI (concerned about side effects). He also has frequent PVCs, 21% total on last holter in 4/18 which is a risk for fall in EF.No family history of CMP. Cannot rule out viral myocarditis. SPEP negative. With medical management, he initially felt a lot better. However, he quit all his meds in early 2018 with recurrence of NYHA III symptoms and onset of atrial fibrillation.He had TEE-DCCV in 3/18.TEE showed that EF remained25%. He quit his meds again in 4/18 and apparently did not restart them when I asked him to in 6/18. No meds probably since 4/18. Echo this admission with EF 15-20%, severe RV dysfunction, LV apical thrombus. RV is hypokinetic.  Attempted DCCV on 1/28 failed likely due to pressors/inotropes.  [place] 1/29 with low cardiac output => Impella 5.0 placed 1/30. On 2/6, Impella removed due to pump stop (suspect vegetation from MV chord sucked into Impella).  CO-OX 55%. CI 3.0  CVP up to 14-15.  On milrinone 0.25 and norepinephrine 2.  Suspect combination of septic/vasogenic shock and cardiogenic shock.  - Lasix 80 mg IV bid x 2 doses again today, concern with rise in BUN/creatinine today.  - Continue milrinone 0.25.  Stopping norepinephrine this morning.  - I think we  can remove Swan and follow co-ox at this point.  - End point still difficult to envision => would be poor LVAD candidate with renal failure and RV failure.   3. Atrial fibrillation:Persistent, now with RVR. HR up to 140sinitially, now 110s on  amiodarone gtt at30. Not sure how long he has been in atrial fibrillation with RVR, but this may have triggered his CHF and AKI due to worsening of cardiac output. -Heparin gtt ongoing. - Continue amiodarone gtt at 30 mg/hr.   - Failed TEE-guided DCCV on 1/28 in setting of vasoactive meds.  If we can wean down milrinone/norepinephrine, will re-attempt DCCV in future => he is now on milrinone 0.25 and norepinephrine is being turned off, may be able to do TEE-guided DCCV this week (will need TEE again as he has not been consistently anticoagulated).   4. AKI: Creatinine 1.08 in 6/18, he has not been on any meds. Suspect cardiorenal syndrome with afib/RVR and fall in cardiac output.He was initially hyperkalemic and acidotic.BUN/creatinine up today, will need to follow closely with diuresis (to get Lasix again today).  5. HT:DSKAJ pneumo PNA as well as parainfluenza virus.CT chest 1/29 with multifocal PNA. On  1/30 to vancomycin/meropenem, then anidulafungin added.  ID has seen. Now on Teflaro and metronidazole. CT of chest/abd/pelvis 2/5 concerning for necrotizing right pneumonia with surrounding complex large pleural effusion.  He has a chest tube with ongoing serosanguineous drainage. 2/5 showed a small vegetation on a mitral valve chord.  Repeat ECHO on 2/6 did not show vegetation. Assumed it was pulled into Impella.  He is now off [employer] but febrile to 100.5 again last night.  - Continue current antibiotics, so far additional culture negative.  WBCs trending down overall.  - As he continues to have low grade fevers, will ask ID to followup on him to make sure no additional steps need to be taken.  - Will remove Swan today.  6. Elevated LFTs: Suspect  shock liver type picture. - AST/ALT > 1000 at admission but have trended down steadily.  7. LV thrombus: Noted on TTE and TEE, small.  - Remains on heparin.   8. Hyponatremia: resolved.  9. Malnutrition: tube feeds.    10. Anemia: Significant blood loss via chest tube and mouth.   Received 2UPRBCs 2/3, 2/4 5 uPRBCs, 2/5 2UPRBCs. 2/6 2U PRBCs. 1 unit PRBCs 2/9.  Hgb stable today.  11. Suspected Deep Tissue Injury -->Sacrum/R buttock/Mid back . Continue to repostion R/L. WOC consult appreciated.   12. Seizure- Neurology appreciated. CT head negative. EEG no seizure noted.    Long-term prognosis remains poor with multisystem organ failure. Have discussed poor prognosiswith family, they want to continue aggressive treatment for the time being.  He is stable today but still requiring a lot of support with no end-point at this time.He is limited code (no CPR but ok to shock and use drugs).   CRITICAL CARE Performed by: Loralie Champagne  Total critical care time: 35 minutes  Critical care time was exclusive of separately billable procedures and treating other patients.  Critical care was necessary to treat or prevent imminent or life-threatening deterioration.  Critical care was time spent personally by me on the following activities: development of treatment plan with patient and/or surrogate as well as nursing, discussions with consultants, evaluation of patient's response to treatment, examination of patient, obtaining history from patient or surrogate, ordering and performing treatments and interventions, ordering and review of laboratory studies, ordering and review of radiographic studies, pulse oximetry and re-evaluation of patient's condition.  Loralie Champagne 10/20/2017 9:59 AM

## 2017-10-20 NOTE — Progress Notes (Signed)
PULMONARY / CRITICAL CARE MEDICINE   Name: Bryan Harmon MRN: [id number] DOB: May 10, 1951    ADMISSION DATE:  10/03/2017  REFERRING MD:  Nils Pyle  CHIEF COMPLAINT:  Fatigue  HISTORY OF PRESENT ILLNESS:   67 y/o male with non-ischemic cardiomyopathy and afib admitted with decompensated heart failure.  Required Impella, vent.    Lines/ Tubes 1/30 L IJ CVL > 1/31 1/30 R IJ swan > 1/30 R sub clav impella > 10/16/2017 1/30 L radial arterial line > 10/15/2017 1/30 ETT >  1/31 L [place] TLC >  10/15/2017 right femoral arterial line placed>>  Cultures: 1/21 Blood >> Strep Pneumoniae >> pan sens 1/21 Resp Viral Panel >> + Parainfluenza Virus 3 1/28 blood > ng 1/30 blood > ng 1/31 resp culture > ng 2/1 BAL > candida 10/15/2017 blood cultures x2>> 10/15/2017 tracheal aspirate>> rare gram-negative rods few Candida albicans 10/15/2017 urine culture>> negative  ABX: Infectious disease consult 10/14/2017 Rocephin 1/21 >> 1/29 Doxy 1/21 >> 1/23 Vanc 1/30 >> 10/14/2017 Mero 1/30 >> 10/15/2017 Eraxis 2/2 >> off Teflaro 10/15/2017>> Flagyl 10/15/2017>>  Studies: 10/01/2017>> Echo EF 15-20%, Apex ? PAP muscle vs. Thrombus, LV severely dilated, moderate concentric hypertrophy, systolic function normal, unable to evaluate LV diastolic function due to atrial fibrillation. + moderate spontaneous echo contrast, indicative of stasis Impression 1/23 Echo > Severely dilated LV with severe LV dysfunction globally with EF 15-20%. Severely dialted RV with severe RV dysfunction. Moderate to severe MR with ERO 0.33cm2 and MR volume 49m. Moderate tosevere TR with moderate pulmonary HTN. Moderately thickened and calcified AV leaflets with mild MR, mildly dilated aortic root, massive biatrial enlargement. Cannot rule out LV thrombus. Thereis significant spontaneous echo contrast in LV c/w sluggish blood flow. The right ventricular systolic pressure was increasedconsistent with moderate pulmonary hypertension. 1/29 CT  chest > dense bilateral lower lobe consolidation R>L 1/30 TEE >> LVEF 20-25%, impella in place, poor overall contractility but LV improved    SUBJECTIVE:   More awake, appears more comfortable on ventilator.  Continues to have adequate cardiac index with 3 noted on 10/20/2017  VITAL SIGNS: BP 90/68 (BP Location: Left Arm)   Pulse 65   Temp 100 F (37.8 C) (Core)   Resp 17   Ht _0  (1.778 m)   Wt 92.1 kg (203 lb)   SpO2 98%   BMI 29.13 kg/m   HEMODYNAMICS: PAP: (30-51)/(17-33) 38/24 CVP:  [3 mmHg-16 mmHg] 12 mmHg  VENTILATOR SETTINGS: Vent Mode: PCV FiO2 (%):  [40 %] 40 % Set Rate:  [10 bmp-22 bmp] 10 bmp PEEP:  [5 cmH20] 5 cmH20 Plateau Pressure:  [17 cmH20] 17 cmH20  INTAKE / OUTPUT: I/O last 3 completed shifts: In: 7577.1 [I.V.:3032.1; Blood:545; Other:150; NG/GT:2600; IV [id number]:8403]Out: 57543[Urine:3720; Stool:260; Chest TKGOV:7034] PHYSICAL EXAMINATION: General: Frail ill-appearing male is more awake today HEENT: Endotracheal tube connected to ventilator, feeding tube in place PSY: Dull effect Neuro: Follows commands x4 CV: Heart sounds are regular heart rate of 114 atrial fibrillation PULM: Decreased air hunger, increased air movement, decreased breath sounds at the base GKB:TCYE non-tender, bsx4 active  Extremities: warm/dry, decreased edema  Skin: Skin breakdown noted at multiple pressure points.        LABS:  BMET Recent Labs  Lab 10/18/17 0400  10/19/17 0304 10/19/17 0653 10/19/17 1719 10/20/17 0230  NA 143   < > 144 143 146* 144  K 3.5   < > 4.0 4.1 3.8 3.8  CL 109   < >  111 111 110 113*  CO2 22  --  22  --   --  21*  BUN 82*   < > 80* 65* 74* 93*  CREATININE 2.49*   < > 2.64* 2.60* 2.60* 2.91*  GLUCOSE 166*   < > 136* 123* 151* 139*   < > = values in this interval not displayed.    Electrolytes Recent Labs  Lab 10/18/17 0400 10/19/17 0304 10/20/17 0230  CALCIUM 7.4* 7.3* 7.3*  MG 2.4 2.4 2.3  PHOS 4.0 3.7 4.5     CBC Recent Labs  Lab 10/18/17 0459  10/19/17 0304 10/19/17 0653 10/19/17 1719 10/20/17 0230  WBC 13.5*  --  11.5*  --   --  9.2  HGB 8.2*   < > 7.6* 7.1* 7.8* 8.4*  HCT 25.5*   < > 24.4* 21.0* 23.0* 26.8*  PLT 164  --  216  --   --  265   < > = values in this interval not displayed.    Coag's Recent Labs  Lab 10/16/17 0404 10/17/17 0454 10/18/17 0400  APTT 82* 72* 98*    Sepsis Markers No results for input(s): LATICACIDVEN, PROCALCITON, O2SATVEN in the last 168 hours.  ABG Recent Labs  Lab 10/19/17 0327 10/19/17 1645 10/20/17 0537  PHART 7.472* 7.457* 7.388  PCO2ART 35.7 28.7* 33.3  PO2ART 119.0* 76.0* 89.0    Liver Enzymes Recent Labs  Lab 10/14/17 0240 10/15/17 1029  AST 63* 68*  ALT 60 44  ALKPHOS 69 75  BILITOT 1.2 1.8*  ALBUMIN 1.7* 1.6*    Cardiac Enzymes No results for input(s): TROPONINI, PROBNP in the last 168 hours.  Glucose Recent Labs  Lab 10/19/17 1117 10/19/17 1611 10/19/17 1956 10/20/17 0013 10/20/17 0307 10/20/17 0752  GLUCAP 119* 150* 135* 137* 127* 98    Imaging Dg Chest Port 1 View  Result Date: 10/20/2017 CLINICAL DATA:  Respiratory failure EXAM: PORTABLE CHEST 1 VIEW COMPARISON:  10/19/2017 FINDINGS: Right chest tube and endotracheal tube as well as Swan-Ganz catheter and feeding tube are unchanged. Cardiomegaly with vascular congestion. Bilateral lower lobe airspace opacities and layering effusions, unchanged. No pneumothorax. IMPRESSION: No significant change since prior study. Electronically Signed   By: Rolm Baptise M.D.   On: 10/20/2017 07:22   DISCUSSION: 67 y/o male with ischemic cardiomyopathy admitted with decompensated systolic heart failure in the setting of strep pneumo pneumonia and bacteremia s/p impella.  He has acute on chronic kidney failure, cardiogenic shock.  10/15/2017 with fever 105.6, pancultured 01/2018, ID consult 10/14/2017. 's overall cardiac function has declined despite all interventions.   Family wants to continue with maximum interventions.  10/18/2016 CODE STATUS changed to a limited with no chest compressions but all other interventions will be continued.  Despite his poor prognosis he is continued to improve therefore possibility of tracheostomy may be undertaken within 72 hours. 10/19/2017 no significant change in status.  I do note there is some having been placed on pressure regulated volume control mode of ventilation which was returned to written orders for pressure control ventilation on 10/19/2017. His hemoglobin is drifting down he may be ready for follow-up transfusion 10/20/2017 continues to slowly improve.  Massive anasarca has improved despite being a positive I&O.  His Levophed is weaning.  Plan for tracheostomy in the next 24-72 hours  ASSESSMENT / PLAN:  PULMONARY A: Acute respiratory failure with hypoxemia Pneumococcal CAP - RLL  Acute pulmonary edema Bilateral pleural effusions , R >> L P:   Attempt  PS trials today but doubt will go well, family requesting that. 10/17/2017 he is in obvious respiratory distress with increased work of breathing. Titrate O2 for sat of 88-92% 10/18/2017 discussion with family concerning goals of care.  If he continues to remain stable and/or improved entertain the idea of tracheostomy with the next 72 hours.  Do not think he would tolerate a trial extubation due to his multiple organ dysfunction and severely debilitated state.  Tracheostomy would be the safest route for him.  CARDIOVASCULAR A:  Acute decompensated cardiomyopathy > severe biventricular failure, LVEF ~ 5% S/p Impella 1/30 with removal on 10/16/2017 after failure most likely from vegetation been ingested by the Impella intake. Cardiogenic Shock.  Multiple vasopressor dependent Medical Non-Compliance - stopped taking all meds prior to admit for CHF, ? Long term plan 10/11/2017 right chest tube P:  Volume management, inotrope management as per advanced heart failure team plans,  TCTS plans.  Note CVP is 8, milrinone is being weaned on 10/18/2017 Ct levophed which is being weaned also./ milrinone gtt Amiodarone drip, if stabilizes consider cardioversion in the future. Continues to have poor prognosis but remarkably continues to improve her daily basis. CT to water seal  RENAL Lab Results  Component Value Date   CREATININE 2.91 (H) 10/20/2017   CREATININE 2.60 (H) 10/19/2017   CREATININE 2.60 (H) 10/19/2017   CREATININE 1.11 03/25/2012   Recent Labs  Lab 10/19/17 0653 10/19/17 1719 10/20/17 0230  K 4.1 3.8 3.8   Recent Labs  Lab 10/19/17 0653 10/19/17 1719 10/20/17 0230  NA 143 146* 144    Intake/Output Summary (Last 24 hours) at 10/20/2017 6144 Last data filed at 10/20/2017 0730 Gross per 24 hour  Intake 5280.88 ml  Output 3744 ml  Net 1536.88 ml    A:   Stable renal failure  Hypokalemia/hypervolemic P:   Continue to follow urine output, BMP Replace electrolytes as indicated Avoid nephrotoxic agents, ensure adequate renal perfusion Note given Lasix 10/19/2017.  Remains a positive I&O CVP of 8  GASTROINTESTINAL A:   No acute issues P:   Tube feeding per nutrition Pepcid IV for ulcer prophylaxis  HEMATOLOGIC Recent Labs    10/19/17 1719 10/20/17 0230  HGB 7.8* 8.4*   Lab Results  Component Value Date   INR 1.33 09/22/2017   INR 1.10 07/05/2016   INR 0.98 10/08/2012   A:   Anemia, thrombocytopenia - blood loss via chest tube Concern for hemolytic anemia with impella, note high LDH P:  PRBCs per CHF service, goal Hb 8 & above Transfused 4 units of packed cells 10/14/2017 Follow CBC and transfusion per cardiology service with 2 units of packed cells on 10/15/2017 Remains on heparin Right chest tube intermittently dumps up to 150 cc of bloody drainage. 10/19/2017 dumped 400 cc bloody drainage from chest tube during the night, hemoglobin noted to be drifting down. Consider transfusion we will leave this to cardiology at this time.   Spoke with cardiologist plans to transfuse.  10/19/2017 10/20/2017 hemoglobin is improved posttransfusion, he continues to have chest tube drainage is bloody approximately 5-600 cc per 24 hours   INFECTIOUS A:   Strep Pneumonia Bacteremia - 1/21 - completed 10 ds of rocephin Severe CAP HCAP? >  No evidence thus far based on BAL from 2/1 Fever 1/30 - new, considering RLL, sinusitis with NGT, multiple lines Parainfluenza virus pneumonia Persistent right pleural effusion appears complex and could be empyema despite right chest tube 10/19/2017 T-max 101 during the night.  He has been followed by infectious disease no cultures are positive at this time. P:   Vancomycin discontinued on 10/14/2017 Continue meropenem off Infectious disease consult 10/14/2017 appreciated 10/17/2017 T-max 98.4 Currently on Teflaro and Flagyl per ID both  started on 10/15/2017 Suspect that yeast in BAL is a contaminant, empiric  Consider drug fever - ID consulted ,note pct was high Note 10/15/2017 fever was 105.6 he was placed on [place] sun for cooling. 10/15/2017 due to fever spike multiple invasive catheters we will pan culture 10/15/2017 Spoke with cardiovascular thoracic surgeons 10/16/2017 no indication for further chest tube.  Question of lytic therapy in the future.  Note 2//2019 chest 2 continues to drain approximately 30-50 cc an hour bloody drainage.  10/19/2017 T-max 101 Note: 10/19/2017 was noted to have down to approximately 400 cc via chest tubes during the night.  Hemoglobin remained stable remains on a heparin drip. 10/20/2017 T-max 100.4, chest tube drainage 600 cc in 24 hours.  ENDOCRINE CBG (last 3)  Recent Labs    10/20/17 0013 10/20/17 0307 10/20/17 0752  GLUCAP 137* 127* 98   A:   Hyperglycemia well controlled P:   Sliding scale insulin per protocol  NEUROLOGIC A:   Sedation Need / Mechanical Ventilation  Suspected seizure 10/14/2017 neurology consult Suspect he may be secondary to febrile state. EEG  performed P:   RASS goal: -1  Sedation protocol: -1> continue precedex at lower dose, PRN fentanyl, versed  EEG with no seizure activity noted.  Neurology has signed off as of 10/16/2017 10/20/2017 awake and follows commands no further seizure activity.  Much more awake  FAMILY  - Updates: 10/20/2017 remains limited code with no CPR.  Wife again updated extensively.  App CCT 60 min  Richardson Landry Minor ACNP Maryanna Shape PCCM Pager [phone] till 1 pm If no answer page [phone] 10/20/2017, 8:08 AM  Attending Note:  67 year old male with PNA, empyema, cardiogenic shock and respiratory failure.  Now off norepi but on milrinone.  On exam, diffuse crackles noted.  I reviewed CXR myself, ETT is in good position.  Pulmonary edema noted.  Cards note read.  Will continue current treatment.  Diureses as renal function allows.  Replace electrolytes.  Appreciate input from ID, continue flagyl and teflaro.  Wean as able.  PCCM will continue to follow.  The patient is critically ill with multiple organ systems failure and requires high complexity decision making for assessment and support, frequent evaluation and titration of therapies, application of advanced monitoring technologies and extensive interpretation of multiple databases.   Critical Care Time devoted to patient care services described in this note is  35  Minutes. This time reflects time of care of this signee Dr Jennet Maduro. This critical care time does not reflect procedure time, or teaching time or supervisory time of PA/NP/Med student/Med Resident etc but could involve care discussion time.  Rush Farmer, M.D. [facility] Pulmonary/Critical Care Medicine. Pager: [phone]. After hours pager: [phone].

## 2017-10-20 NOTE — Consult Note (Signed)
[facility] for Infectious Disease    Date of Admission:  09/10/2017   Total days of antibiotics 21        Day 6 ceftaroline        Day 6 metronidazole               Reason for Consult: ICU fever    Referring Provider: Dr. Loralie Champagne  Assessment: The cause of his low-grade fevers is uncertain but there are multiple potential sources including healthcare associated pneumonia, loculated right pleural effusion, bloodstream infection related to multiple lines, urinary tract infection, and C. difficile colitis.  The small area of erythema on his right upper arm raises the possibility of drug fever but I doubt this is the case.  I will continue current antibiotics for now and repeat surveillance cultures and C. difficile PCR.  Plan: 1. Continue current antibiotics 2. Urine, sputum and blood cultures 3. C. difficile PCR 4. Contact precautions  Principal Problem:   Fever Active Problems:   HCAP (healthcare-associated pneumonia)   Pneumococcal bacteremia   Atrial fibrillation with RVR (HCC)   Acute on chronic systolic heart failure (HCC)   Encounter for central line care   Shock circulatory (HCC)   Malnutrition of moderate degree   Cardiogenic shock (HCC)   LVAD (left ventricular assist device) present (HCC)   Pressure injury of skin   AKI (acute kidney injury) ([place])   Central line infiltration (HCC)   CHF (congestive heart failure) ([place])   Community acquired pneumonia of right lower lobe of lung ([place])   Hemothorax on right   Hx of chest tube placement   Focal seizure (HCC)   Unresponsiveness   Seizure ([place])   Acute bacterial endocarditis   Empyema (HCC)   Scheduled Meds: . chlorhexidine gluconate (MEDLINE KIT)  15 mL Mouth Rinse BID  . Chlorhexidine Gluconate Cloth  6 each Topical Daily  . feeding supplement (PRO-STAT SUGAR FREE 64)  30 mL Per Tube TID  . furosemide  80 mg Intravenous BID  . Gerhardt's butt cream   Topical BID  . insulin aspart  2-6  Units Subcutaneous Q4H  . insulin glargine  12 Units Subcutaneous BID  . mouth rinse  15 mL Mouth Rinse 10 times per day  . sennosides  5 mL Oral Daily  . sodium chloride flush  3 mL Intravenous Q12H   Continuous Infusions: . sodium chloride 20 mL/hr at 10/15/17 0608  . amiodarone 30 mg/hr (10/20/17 0858)  . ceFTAROline (TEFLARO) IV 400 mg (10/20/17 1338)  . dexmedetomidine (PRECEDEX) IV infusion for high rates 1 mcg/kg/hr (10/20/17 1204)  . famotidine (PEPCID) IV Stopped (10/20/17 1015)  . feeding supplement (VITAL 1.5 CAL) 1,000 mL (10/19/17 2254)  . heparin 2,150 Units/hr (10/20/17 0656)  . metronidazole Stopped (10/20/17 1247)  . milrinone 0.25 mcg/kg/min (10/20/17 1204)  . norepinephrine (LEVOPHED) Adult infusion Stopped (10/20/17 0951)   PRN Meds:.sodium chloride, acetaminophen (TYLENOL) oral liquid 160 mg/5 mL, albuterol, magic mouthwash, midazolam, morphine injection, ondansetron (ZOFRAN) IV, sodium chloride flush, sodium chloride flush  HPI: JAXSYN AZAM is a 67 y.o. male who was admitted on 10/10/2017 with community-acquired pneumonia and acute on chronic heart failure.  One admission blood culture grew pneumococcus and his respiratory virus panel was positive for parainfluenza.  He was treated with IV ceftriaxone.  He underwent LVAD placement on 10/02/2017.  He began having fevers that same day and his antibiotic therapy was broadened to meropenem and vancomycin.  All blood and urine cultures were negative.  BAL culture grew Candida albicans.  Anidulafungin was added to 10/12/2017.  He defervesced on 10/14/2017.  Echocardiograms done on 2/5 and 2/6 showed "highly mobile masses on the Impella device".  My partner, Dr. Tommy Medal, changed antibiotics to ceftaroline and metronidazole.  The device quit working (presumably due to embolization of a vegetation/clot into the device) and was removed on 10/17/2017.  He began having low-grade fevers again 2 days ago.   Review of Systems: Review  of Systems  Unable to perform ROS: Intubated    Past Medical History:  Diagnosis Date  . Hypertension   . Inguinal hernia, bilateral     Social History   Tobacco Use  . Smoking status: Former Smoker    Last attempt to quit: 09/10/1996    Years since quitting: 21.1  . Smokeless tobacco: Never Used  Substance Use Topics  . Alcohol use: Yes    Comment: occasional   . Drug use: No    Family History  Problem Relation Age of Onset  . Hypertension Maternal Grandmother   . Cancer Neg Hx   . Heart disease Neg Hx    Allergies  Allergen Reactions  . Fentanyl And Related Other (See Comments)    Patients eyes roll in the back of his head and patient has seizure like activity    OBJECTIVE: Blood pressure (!) 87/64, pulse (!) 51, temperature (!) 100.9 F (38.3 C), resp. rate 13, height _0  (1.778 m), weight 203 lb (92.1 kg), SpO2 94 %.  Physical Exam  Constitutional:  He is intubated.  His eyes are open.  He will occasionally track with his eyes.  He is requiring less sedation.  HENT:  He has a right-sided nasopharyngeal tube he is orally intubated..  Cardiovascular: Normal rate.  Murmur heard. Irregular rhythm with 2/6 systolic murmur.  Pulmonary/Chest: He has no wheezes. He has no rales.  He has a clean dry gauze dressing over his right anterior chest surgical site.  Thin bloody drainage and right chest tube.  Abdominal: Soft. He exhibits no distension.  Liquid brown stool in Flexi-Seal bag.  Neurological: He is alert.  Skin:  Patchy erythema on right upper arm.  Small pressure sores on both heels, sacrum and upper back without evidence of infection.  Right IJ catheter placed 09/27/2017 Left subclavian catheter placed 10/10/2017 Right femoral A-line placed 10/15/2017    Lab Results Lab Results  Component Value Date   WBC 9.2 10/20/2017   HGB 8.4 (L) 10/20/2017   HCT 26.8 (L) 10/20/2017   MCV 96.8 10/20/2017   PLT 265 10/20/2017    Lab Results  Component Value  Date   CREATININE 2.91 (H) 10/20/2017   BUN 93 (H) 10/20/2017   NA 144 10/20/2017   K 3.8 10/20/2017   CL 113 (H) 10/20/2017   CO2 21 (L) 10/20/2017    Lab Results  Component Value Date   ALT 44 10/15/2017   AST 68 (H) 10/15/2017   ALKPHOS 75 10/15/2017   BILITOT 1.8 (H) 10/15/2017     Microbiology: Recent Results (from the past 240 hour(s))  Culture, bal-quantitative     Status: Abnormal   Collection Time: 10/11/17 11:11 AM  Result Value Ref Range Status   Specimen Description BRONCHIAL ALVEOLAR LAVAGE  Final   Special Requests NONE  Final   Gram Stain   Final    MODERATE WBC PRESENT,BOTH PMN AND MONONUCLEAR NO SQUAMOUS EPITHELIAL CELLS SEEN RARE BUDDING YEAST SEEN  Performed at [facility], [place] [address], [place], [place] [address]    Culture 40,000 COLONIES/mL CANDIDA ALBICANS (A)  Final   Report Status 10/13/2017 FINAL  Final  Culture, respiratory (NON-Expectorated)     Status: None   Collection Time: 10/15/17  8:12 AM  Result Value Ref Range Status   Specimen Description TRACHEAL ASPIRATE  Final   Special Requests Normal  Final   Gram Stain   Final    FEW WBC PRESENT, PREDOMINANTLY PMN RARE GRAM NEGATIVE RODS Performed at [facility], [place] [address], [place], [place] [address]    Culture FEW CANDIDA ALBICANS  Final   Report Status 10/18/2017 FINAL  Final  Culture, blood (Routine X 2) w Reflex to ID Panel     Status: None   Collection Time: 10/15/17  8:52 AM  Result Value Ref Range Status   Specimen Description BLOOD RIGHT HAND  Final   Special Requests IN PEDIATRIC BOTTLE Blood Culture adequate volume  Final   Culture   Final    NO GROWTH 5 DAYS Performed at [facility], [place] [address], [place], [place] [address]    Report Status 10/20/2017 FINAL  Final  Culture, blood (Routine X 2) w Reflex to ID Panel     Status: None   Collection Time: 10/15/17 10:20 AM  Result Value Ref Range Status   Specimen Description BLOOD LEFT HAND  Final    Special Requests IN PEDIATRIC BOTTLE Blood Culture adequate volume  Final   Culture   Final    NO GROWTH 5 DAYS Performed at [facility], [place] [address], [place], [place] [address]    Report Status 10/20/2017 FINAL  Final  Culture, Urine     Status: None   Collection Time: 10/15/17 10:29 AM  Result Value Ref Range Status   Specimen Description URINE, CATHETERIZED  Final   Special Requests Normal  Final   Culture   Final    NO GROWTH Performed at [facility] [address], [place],  [address]    Report Status 10/16/2017 FINAL  Final    Michel Bickers, MD [facility] [phone] pager   [phone] cell 10/20/2017, 2:04 PM

## 2017-10-21 ENCOUNTER — Inpatient Hospital Stay (HOSPITAL_COMMUNITY): Payer: Medicare Other

## 2017-10-21 LAB — POCT I-STAT, CHEM 8
BUN: 73 mg/dL — ABNORMAL HIGH (ref 6–20)
BUN: 81 mg/dL — ABNORMAL HIGH (ref 6–20)
CALCIUM ION: 1.11 mmol/L — AB (ref 1.15–1.40)
CALCIUM ION: 1.11 mmol/L — AB (ref 1.15–1.40)
Chloride: 115 mmol/L — ABNORMAL HIGH (ref 101–111)
Chloride: 112 mmol/L — ABNORMAL HIGH (ref 101–111)
Creatinine, Ser: 3 mg/dL — ABNORMAL HIGH (ref 0.61–1.24)
Creatinine, Ser: 3.1 mg/dL — ABNORMAL HIGH (ref 0.61–1.24)
Glucose, Bld: 115 mg/dL — ABNORMAL HIGH (ref 65–99)
Glucose, Bld: 116 mg/dL — ABNORMAL HIGH (ref 65–99)
HCT: 56 % — ABNORMAL HIGH (ref 39.0–52.0)
HEMATOCRIT: 24 % — AB (ref 39.0–52.0)
HEMOGLOBIN: 8.2 g/dL — AB (ref 13.0–17.0)
Hemoglobin: 19 g/dL — ABNORMAL HIGH (ref 13.0–17.0)
Potassium: 3.7 mmol/L (ref 3.5–5.1)
Potassium: 3.7 mmol/L (ref 3.5–5.1)
SODIUM: 147 mmol/L — AB (ref 135–145)
SODIUM: 146 mmol/L — AB (ref 135–145)
TCO2: 20 mmol/L — AB (ref 22–32)
TCO2: 22 mmol/L (ref 22–32)

## 2017-10-21 LAB — CBC WITH DIFFERENTIAL/PLATELET
Basophils Absolute: 0 10*3/uL (ref 0.0–0.1)
Basophils Relative: 0 %
Eosinophils Absolute: 0 10*3/uL (ref 0.0–0.7)
Eosinophils Relative: 0 %
HEMATOCRIT: 28.1 % — AB (ref 39.0–52.0)
HEMOGLOBIN: 8.9 g/dL — AB (ref 13.0–17.0)
LYMPHS ABS: 0.4 10*3/uL — AB (ref 0.7–4.0)
LYMPHS PCT: 5 %
MCH: 30.8 pg (ref 26.0–34.0)
MCHC: 31.7 g/dL (ref 30.0–36.0)
MCV: 97.2 fL (ref 78.0–100.0)
Monocytes Absolute: 0.5 10*3/uL (ref 0.1–1.0)
Monocytes Relative: 6 %
NEUTROS ABS: 8.1 10*3/uL — AB (ref 1.7–7.7)
Neutrophils Relative %: 89 %
Platelets: 345 10*3/uL (ref 150–400)
RBC: 2.89 MIL/uL — AB (ref 4.22–5.81)
RDW: 20.5 % — ABNORMAL HIGH (ref 11.5–15.5)
WBC: 9.1 10*3/uL (ref 4.0–10.5)

## 2017-10-21 LAB — LACTATE DEHYDROGENASE: LDH: 439 U/L — ABNORMAL HIGH (ref 98–192)

## 2017-10-21 LAB — POCT I-STAT 3, ART BLOOD GAS (G3+)
ACID-BASE DEFICIT: 3 mmol/L — AB (ref 0.0–2.0)
Bicarbonate: 19.9 mmol/L — ABNORMAL LOW (ref 20.0–28.0)
O2 Saturation: 96 %
PCO2 ART: 29.4 mmHg — AB (ref 32.0–48.0)
PO2 ART: 83 mmHg (ref 83.0–108.0)
Patient temperature: 38.5
TCO2: 21 mmol/L — ABNORMAL LOW (ref 22–32)
pH, Arterial: 7.444 (ref 7.350–7.450)

## 2017-10-21 LAB — BASIC METABOLIC PANEL
Anion gap: 11 (ref 5–15)
BUN: 94 mg/dL — AB (ref 6–20)
CHLORIDE: 113 mmol/L — AB (ref 101–111)
CO2: 21 mmol/L — AB (ref 22–32)
Calcium: 7.5 mg/dL — ABNORMAL LOW (ref 8.9–10.3)
Creatinine, Ser: 3.05 mg/dL — ABNORMAL HIGH (ref 0.61–1.24)
GFR calc Af Amer: 23 mL/min — ABNORMAL LOW (ref >60)
GFR calc non Af Amer: 20 mL/min — ABNORMAL LOW (ref >60)
Glucose, Bld: 174 mg/dL — ABNORMAL HIGH (ref 65–99)
Potassium: 4 mmol/L (ref 3.5–5.1)
SODIUM: 145 mmol/L (ref 135–145)

## 2017-10-21 LAB — GLUCOSE, CAPILLARY
GLUCOSE-CAPILLARY: 159 mg/dL — AB (ref 65–99)
GLUCOSE-CAPILLARY: 119 mg/dL — AB (ref 65–99)
GLUCOSE-CAPILLARY: 140 mg/dL — AB (ref 65–99)
GLUCOSE-CAPILLARY: 107 mg/dL — AB (ref 65–99)
Glucose-Capillary: 115 mg/dL — ABNORMAL HIGH (ref 65–99)
Glucose-Capillary: 152 mg/dL — ABNORMAL HIGH (ref 65–99)

## 2017-10-21 LAB — PHOSPHORUS: PHOSPHORUS: 5.2 mg/dL — AB (ref 2.5–4.6)

## 2017-10-21 LAB — COOXEMETRY PANEL
Carboxyhemoglobin: 1.4 % (ref 0.5–1.5)
Methemoglobin: 1.3 % (ref 0.0–1.5)
O2 Saturation: 57.4 %
Total hemoglobin: 9.1 g/dL — ABNORMAL LOW (ref 12.0–16.0)

## 2017-10-21 LAB — MAGNESIUM: Magnesium: 2.2 mg/dL (ref 1.7–2.4)

## 2017-10-21 LAB — HEPARIN LEVEL (UNFRACTIONATED)
Heparin Unfractionated: 0.39 IU/mL (ref 0.30–0.70)
Heparin Unfractionated: 0.54 IU/mL (ref 0.30–0.70)

## 2017-10-21 LAB — URINE CULTURE
CULTURE: NO GROWTH
SPECIAL REQUESTS: NORMAL

## 2017-10-21 LAB — CALCIUM, IONIZED: Calcium, Ionized, Serum: 4.6 mg/dL (ref 4.5–5.6)

## 2017-10-21 NOTE — Progress Notes (Signed)
ANTICOAGULATION CONSULT NOTE - Follow Up Consult  Pharmacy Consult:  Heparin Indication: atrial fibrillation and LA thrombus  Allergies  Allergen Reactions  . Fentanyl And Related Other (See Comments)    Patients eyes roll in the back of his head and patient has seizure like activity    Patient Measurements: Height: 5\' 10"  (177.8 cm) Weight: 217 lb (98.4 kg) IBW/kg (Calculated) : 73  Heparin dosing weight = 84 kg  Vital Signs: BP: 113/73 (02/11 1400) Pulse Rate: 131 (02/11 1500)  Labs: Recent Labs    10/19/17 0304  10/20/17 0230 10/20/17 1617 10/21/17 0354 10/21/17 1533 10/21/17 1556  HGB 7.6*   < > 8.4* 8.2* 8.9*  --  19.0*  HCT 24.4*   < > 26.8* 24.0* 28.1*  --  56.0*  PLT 216  --  265  --  345  --   --   HEPARINUNFRC 0.30  --  0.32  --  0.39 0.54  --   CREATININE 2.64*   < > 2.91* 3.00* 3.05*  --  3.00*   < > = values in this interval not displayed.    Estimated Creatinine Clearance: 28.9 mL/min (A) (by C-G formula based on SCr of 3 mg/dL (H)).    Assessment: 41 YOM known to pharmacy for heparin management with impella. Impella removed 10/16/17 due to stoppage from small vegetation that got sucked into the catheter. Heparin resumed ~ 4 hours after removal for afib and LA thrombus.   Heparin level supra-therapeutic and trended up.  Lab drawn from the right A-line and heparin is infusing through the left pIV.  Chest tube still with bloody drainage but output is decreasing.   Goal of Therapy:  Heparin level 0.3 units/ml Monitor platelets by anticoagulation protocol: Yes    Plan:  Decrease heparin gtt to 1850 units/hr Check 8 hr heparin level Daily heparin level and CBC   Sharelle Burditt D. Laney Potash, PharmD, BCPS Pager:  [phone] 10/21/2017, 4:48 PM

## 2017-10-21 NOTE — Progress Notes (Signed)
Subjective: Family at bedside. States that he is getting some strength back, lifting his arms and moving his head. He is responsive for them. They have noticed increased swelling of his LEs but attribute it to the compression stocking. We discussed that from and infection standpoint we will continue antibiotics and look for sources of infection. All questions and concerns addressed.   Antibiotics:  Anti-infectives (From admission, onward)   Start     Dose/Rate Route Frequency Ordered Stop   10/15/17 1200  ceftaroline (TEFLARO) 400 mg in sodium chloride 0.9 % 250 mL IVPB     400 mg 250 mL/hr over 60 Minutes Intravenous Every 12 hours 10/15/17 1052     10/15/17 1100  metroNIDAZOLE (FLAGYL) IVPB 500 mg     500 mg 100 mL/hr over 60 Minutes Intravenous Every 8 hours 10/15/17 1052     10/13/17 1200  anidulafungin (ERAXIS) 100 mg in sodium chloride 0.9 % 100 mL IVPB  Status:  Discontinued     100 mg 78 mL/hr over 100 Minutes Intravenous Every 24 hours 10/12/17 1123 10/14/17 1238   10/12/17 1200  anidulafungin (ERAXIS) 200 mg in sodium chloride 0.9 % 200 mL IVPB     200 mg 78 mL/hr over 200 Minutes Intravenous  Once 10/12/17 1123 10/12/17 1702   10/04/2017 2000  vancomycin (VANCOCIN) IVPB 1000 mg/200 mL premix  Status:  Discontinued     1,000 mg 200 mL/hr over 60 Minutes Intravenous Every 24 hours 10/05/2017 1754 10/14/17 0951   10/05/2017 1800  meropenem (MERREM) 1 g in sodium chloride 0.9 % 100 mL IVPB  Status:  Discontinued     1 g 200 mL/hr over 30 Minutes Intravenous Every 12 hours 09/20/2017 1720 10/15/17 1052   09/20/2017 0912  vancomycin (VANCOCIN) 1,000 mg in sodium chloride 0.9 % 1,000 mL irrigation  Status:  Discontinued       As needed 09/22/2017 0912 09/11/2017 1115   10/06/2017 0700  fluconazole (DIFLUCAN) IVPB 400 mg  Status:  Discontinued     400 mg 100 mL/hr over 120 Minutes Intravenous To Surgery 09/21/2017 0650 09/12/2017 1115   09/27/2017 0700  rifampin (RIFADIN) 600 mg in sodium  chloride 0.9 % 100 mL IVPB  Status:  Discontinued     600 mg 200 mL/hr over 30 Minutes Intravenous To Surgery 09/17/2017 0650 09/10/2017 1115   09/29/2017 0700  vancomycin (VANCOCIN) powder 1,000 mg  Status:  Discontinued     1,000 mg Other To Surgery 10/04/2017 0650 09/26/2017 1115   09/20/2017 0400  vancomycin (VANCOCIN) 1,250 mg in sodium chloride 0.9 % 250 mL IVPB     1,250 mg 166.7 mL/hr over 90 Minutes Intravenous To Surgery 09/12/2017 1910 09/29/2017 1222   10/03/2017 0400  cefUROXime (ZINACEF) 1.5 g in dextrose 5 % 50 mL IVPB     1.5 g 100 mL/hr over 30 Minutes Intravenous To Surgery 10/03/2017 1910 10/10/2017 1202   10/10/2017 0400  cefUROXime (ZINACEF) 750 mg in dextrose 5 % 50 mL IVPB  Status:  Discontinued     750 mg 100 mL/hr over 30 Minutes Intravenous To Surgery 09/26/2017 1910 09/26/2017 1115   10/01/17 0600  cefTRIAXone (ROCEPHIN) 2 g in dextrose 5 % 50 mL IVPB  Status:  Discontinued     2 g 100 mL/hr over 30 Minutes Intravenous Every 24 hours 10/01/17 0541 09/16/2017 1705   09/15/2017 1315  doxycycline (VIBRA-TABS) tablet 100 mg  Status:  Discontinued     100 mg  Oral Every 12 hours 09/21/2017 1301 10/02/17 1346   09/17/2017 1315  cefTRIAXone (ROCEPHIN) 1 g in dextrose 5 % 50 mL IVPB  Status:  Discontinued     1 g 100 mL/hr over 30 Minutes Intravenous Every 24 hours 09/22/2017 1301 10/01/17 0541     Medications: Scheduled Meds: . chlorhexidine gluconate (MEDLINE KIT)  15 mL Mouth Rinse BID  . Chlorhexidine Gluconate Cloth  6 each Topical Daily  . feeding supplement (PRO-STAT SUGAR FREE 64)  30 mL Per Tube TID  . Gerhardt's butt cream   Topical BID  . insulin aspart  2-6 Units Subcutaneous Q4H  . insulin glargine  12 Units Subcutaneous BID  . mouth rinse  15 mL Mouth Rinse 10 times per day  . sennosides  5 mL Oral Daily  . sodium chloride flush  3 mL Intravenous Q12H   Continuous Infusions: . sodium chloride 20 mL/hr at 10/15/17 0608  . amiodarone 30 mg/hr (10/21/17 0800)  . ceFTAROline (TEFLARO) IV  Stopped (10/21/17 0321)  . dexmedetomidine (PRECEDEX) IV infusion for high rates 1 mcg/kg/hr (10/21/17 0800)  . famotidine (PEPCID) IV Stopped (10/20/17 1015)  . feeding supplement (VITAL 1.5 CAL) 1,000 mL (10/21/17 0800)  . heparin 2,150 Units/hr (10/21/17 0800)  . metronidazole Stopped (10/21/17 0435)  . milrinone 0.25 mcg/kg/min (10/21/17 0800)  . norepinephrine (LEVOPHED) Adult infusion 12 mcg/min (10/21/17 0800)   PRN Meds:.sodium chloride, acetaminophen (TYLENOL) oral liquid 160 mg/5 mL, albuterol, magic mouthwash, midazolam, morphine injection, ondansetron (ZOFRAN) IV, sodium chloride flush, sodium chloride flush  Objective: Weight change: 14 lb (6.35 kg)  Intake/Output Summary (Last 24 hours) at 10/21/2017 2248 Last data filed at 10/21/2017 0800 Gross per 24 hour  Intake 4601.07 ml  Output 3598 ml  Net 1003.07 ml   Blood pressure 100/84, pulse (!) 120, temperature (!) 100.9 F (38.3 C), temperature source Core (Comment), resp. rate 17, height _0  (1.778 m), weight 217 lb (98.4 kg), SpO2 96 %. Temp:  [98.3 F (36.8 C)-100.9 F (38.3 C)] 100.9 F (38.3 C) (02/11 0400) Pulse Rate:  [25-147] 120 (02/11 0730) Resp:  [13-24] 17 (02/11 0730) BP: (76-105)/(50-84) 100/84 (02/11 0700) SpO2:  [91 %-100 %] 96 % (02/11 0730) Arterial Line BP: (83-114)/(48-66) 102/66 (02/11 0700) FiO2 (%):  [40 %] 40 % (02/11 0730) Weight:  [217 lb (98.4 kg)] 217 lb (98.4 kg) (02/11 0500)  Physical Exam: General: Somnolent this AM and difficult to arouse HEENT: Anicteric sclera, pupils reactive to light and accommodation, EOMI CVS: Distant heart sounds, tachycardic  Chest: Diffuse inspiratory crackles and rhonchi  Abdomen: Distended and firm  Extremities: No clubbing or edema noted bilaterally Skin: No rashes Lymph: No new lymphadenopathy Neuro: Somnolent and difficult to arouse  CBC: _1 (wbc3,Hgb:3,Hct:3,Plt:3,INR:3APTT:3)@  BMET Recent Labs    10/20/17 0230 10/20/17 1617  10/21/17 0354  NA 144 146* 145  K 3.8 4.0 4.0  CL 113* 110 113*  CO2 21*  --  21*  GLUCOSE 139* 126* 174*  BUN 93* 85* 94*  CREATININE 2.91* 3.00* 3.05*  CALCIUM 7.3*  --  7.5*   Liver Panel  No results for input(s): PROT, ALBUMIN, AST, ALT, ALKPHOS, BILITOT, BILIDIR, IBILI in the last 72 hours.  Sedimentation Rate No results for input(s): ESRSEDRATE in the last 72 hours. C-Reactive Protein No results for input(s): CRP in the last 72 hours.  Micro Results: Recent Results (from the past 720 hour(s))  Culture, blood (Routine X 2) w Reflex to ID Panel     Status:  Abnormal   Collection Time: 09/29/2017  1:08 PM  Result Value Ref Range Status   Specimen Description BLOOD RIGHT HAND  Final   Special Requests   Final    BOTTLES DRAWN AEROBIC AND ANAEROBIC Blood Culture adequate volume   Culture  Setup Time   Final    GRAM POSITIVE COCCI AEROBIC BOTTLE ONLY CRITICAL RESULT CALLED TO, READ BACK BY AND VERIFIED WITH: J LEDFORD PHARMD 0532 10/01/17 A BROWNING    Culture STREPTOCOCCUS PNEUMONIAE (A)  Final   Report Status 10/03/2017 FINAL  Final   Organism ID, Bacteria STREPTOCOCCUS PNEUMONIAE  Final      Susceptibility   Streptococcus pneumoniae - MIC*    ERYTHROMYCIN <=0.12 SENSITIVE Sensitive     LEVOFLOXACIN 0.5 SENSITIVE Sensitive     PENICILLIN (non-meningitis) 0.25 SENSITIVE Sensitive     CEFTRIAXONE (non-meningitis) <=0.12 SENSITIVE Sensitive     * STREPTOCOCCUS PNEUMONIAE  Blood Culture ID Panel (Reflexed)     Status: Abnormal   Collection Time: 09/27/2017  1:08 PM  Result Value Ref Range Status   Enterococcus species NOT DETECTED NOT DETECTED Final   Listeria monocytogenes NOT DETECTED NOT DETECTED Final   Staphylococcus species NOT DETECTED NOT DETECTED Final   Staphylococcus aureus NOT DETECTED NOT DETECTED Final   Streptococcus species DETECTED (A) NOT DETECTED Final    Comment: CRITICAL RESULT CALLED TO, READ BACK BY AND VERIFIED WITH: J LEDFORD PHARMD 0532 10/01/17 A  BROWNING    Streptococcus agalactiae NOT DETECTED NOT DETECTED Final   Streptococcus pneumoniae DETECTED (A) NOT DETECTED Final    Comment: CRITICAL RESULT CALLED TO, READ BACK BY AND VERIFIED WITH: J LEDFORD PHARMD 0532 10/01/17 A BROWNING    Streptococcus pyogenes NOT DETECTED NOT DETECTED Final   Acinetobacter baumannii NOT DETECTED NOT DETECTED Final   Enterobacteriaceae species NOT DETECTED NOT DETECTED Final   Enterobacter cloacae complex NOT DETECTED NOT DETECTED Final   Escherichia coli NOT DETECTED NOT DETECTED Final   Klebsiella oxytoca NOT DETECTED NOT DETECTED Final   Klebsiella pneumoniae NOT DETECTED NOT DETECTED Final   Proteus species NOT DETECTED NOT DETECTED Final   Serratia marcescens NOT DETECTED NOT DETECTED Final   Haemophilus influenzae NOT DETECTED NOT DETECTED Final   Neisseria meningitidis NOT DETECTED NOT DETECTED Final   Pseudomonas aeruginosa NOT DETECTED NOT DETECTED Final   Candida albicans NOT DETECTED NOT DETECTED Final   Candida glabrata NOT DETECTED NOT DETECTED Final   Candida krusei NOT DETECTED NOT DETECTED Final   Candida parapsilosis NOT DETECTED NOT DETECTED Final   Candida tropicalis NOT DETECTED NOT DETECTED Final  Culture, blood (Routine X 2) w Reflex to ID Panel     Status: None   Collection Time: 09/15/2017  1:11 PM  Result Value Ref Range Status   Specimen Description BLOOD LEFT FOREARM  Final   Special Requests IN PEDIATRIC BOTTLE Blood Culture adequate volume  Final   Culture NO GROWTH 5 DAYS  Final   Report Status 10/05/2017 FINAL  Final  Respiratory Panel by PCR     Status: Abnormal   Collection Time: 09/22/2017  1:51 PM  Result Value Ref Range Status   Adenovirus NOT DETECTED NOT DETECTED Final   Coronavirus 229E NOT DETECTED NOT DETECTED Final   Coronavirus HKU1 NOT DETECTED NOT DETECTED Final   Coronavirus NL63 NOT DETECTED NOT DETECTED Final   Coronavirus OC43 NOT DETECTED NOT DETECTED Final   Metapneumovirus NOT DETECTED NOT  DETECTED Final   Rhinovirus / Enterovirus NOT DETECTED NOT  DETECTED Final   Influenza A NOT DETECTED NOT DETECTED Final   Influenza B NOT DETECTED NOT DETECTED Final   Parainfluenza Virus 1 NOT DETECTED NOT DETECTED Final   Parainfluenza Virus 2 NOT DETECTED NOT DETECTED Final   Parainfluenza Virus 3 DETECTED (A) NOT DETECTED Final   Parainfluenza Virus 4 NOT DETECTED NOT DETECTED Final   Respiratory Syncytial Virus NOT DETECTED NOT DETECTED Final   Bordetella pertussis NOT DETECTED NOT DETECTED Final   Chlamydophila pneumoniae NOT DETECTED NOT DETECTED Final   Mycoplasma pneumoniae NOT DETECTED NOT DETECTED Final  Culture, blood (routine x 2)     Status: None   Collection Time: 10/06/2017  4:45 PM  Result Value Ref Range Status   Specimen Description BLOOD LEFT HAND  Final   Special Requests IN PEDIATRIC BOTTLE Blood Culture adequate volume  Final   Culture NO GROWTH 5 DAYS  Final   Report Status 10/05/2017 FINAL  Final  MRSA PCR Screening     Status: None   Collection Time: 09/18/2017  4:49 PM  Result Value Ref Range Status   MRSA by PCR NEGATIVE NEGATIVE Final    Comment:        The GeneXpert MRSA Assay (FDA approved for NASAL specimens only), is one component of a comprehensive MRSA colonization surveillance program. It is not intended to diagnose MRSA infection nor to guide or monitor treatment for MRSA infections.   Culture, blood (routine x 2)     Status: None   Collection Time: 09/28/2017  5:06 PM  Result Value Ref Range Status   Specimen Description BLOOD RIGHT HAND  Final   Special Requests IN PEDIATRIC BOTTLE Blood Culture adequate volume  Final   Culture NO GROWTH 5 DAYS  Final   Report Status 10/05/2017 FINAL  Final  Culture, blood (Routine X 2) w Reflex to ID Panel     Status: None   Collection Time: 09/25/2017  4:18 PM  Result Value Ref Range Status   Specimen Description BLOOD RIGHT ANTECUBITAL  Final   Special Requests IN PEDIATRIC BOTTLE Blood Culture adequate  volume  Final   Culture   Final    NO GROWTH 5 DAYS Performed at [facility], [place] [address], [place], [place] [address]    Report Status 10/12/2017 FINAL  Final  Culture, blood (Routine X 2) w Reflex to ID Panel     Status: None   Collection Time: 09/17/2017  4:20 PM  Result Value Ref Range Status   Specimen Description BLOOD RIGHT ANTECUBITAL  Final   Special Requests IN PEDIATRIC BOTTLE Blood Culture adequate volume  Final   Culture   Final    NO GROWTH 5 DAYS Performed at [facility], [place] [address] [place], [place] [address]    Report Status 10/12/2017 FINAL  Final  Surgical pcr screen     Status: Abnormal   Collection Time: 09/20/2017  8:37 PM  Result Value Ref Range Status   MRSA, PCR RESULT CALLED TO, READ BACK BY AND VERIFIED WITH: (A) NEGATIVE Final    Comment:  M AMO RN 09/29/2017 0504 JDW   Staphylococcus aureus INVALID RESULTS, SPECIMEN SENT FOR CULTURE (A) NEGATIVE Final    Comment: Results Called to:  M [facility] 09/26/2017 0504 JDW (NOTE) The Xpert SA Assay (FDA approved for NASAL specimens in patients 46 years of age and older), is one component of a comprehensive surveillance program. It is not intended to diagnose infection nor to guide or monitor  treatment.   MRSA culture     Status: None   Collection Time: 09/14/2017  8:37 PM  Result Value Ref Range Status   Specimen Description NASOPHARYNGEAL  Final   Special Requests NONE  Final   Culture NO MRSA DETECTED  Final   Report Status 10/10/2017 FINAL  Final  Culture, blood (routine x 2)     Status: None   Collection Time: 10/06/2017  5:52 PM  Result Value Ref Range Status   Specimen Description BLOOD RIGHT ANTECUBITAL  Final   Special Requests IN PEDIATRIC BOTTLE Blood Culture adequate volume  Final   Culture   Final    NO GROWTH 5 DAYS Performed at [facility], [place] [address] [place], [place] [address]    Report Status 10/14/2017 FINAL  Final  Culture, blood (routine x 2)     Status: None    Collection Time: 09/13/2017  6:00 PM  Result Value Ref Range Status   Specimen Description BLOOD RIGHT HAND  Final   Special Requests IN PEDIATRIC BOTTLE Blood Culture adequate volume  Final   Culture   Final    NO GROWTH 5 DAYS Performed at [facility], [place] [address] [place], [place] [address]    Report Status 10/14/2017 FINAL  Final  Culture, respiratory (NON-Expectorated)     Status: None   Collection Time: 10/10/17  7:55 AM  Result Value Ref Range Status   Specimen Description TRACHEAL ASPIRATE  Final   Special Requests NONE  Final   Gram Stain   Final    RARE WBC PRESENT, PREDOMINANTLY MONONUCLEAR NO ORGANISMS SEEN Performed at [facility], [place] [address] [place], [place] [address]    Culture RARE CANDIDA ALBICANS  Final   Report Status 10/12/2017 FINAL  Final  Culture, bal-quantitative     Status: Abnormal   Collection Time: 10/11/17 11:11 AM  Result Value Ref Range Status   Specimen Description BRONCHIAL ALVEOLAR LAVAGE  Final   Special Requests NONE  Final   Gram Stain   Final    MODERATE WBC PRESENT,BOTH PMN AND MONONUCLEAR NO SQUAMOUS EPITHELIAL CELLS SEEN RARE BUDDING YEAST SEEN Performed at [facility], [place] [address], [place] [employer], [place] [address]    Culture 40,000 COLONIES/mL CANDIDA ALBICANS (A)  Final   Report Status 10/13/2017 FINAL  Final  Culture, respiratory (NON-Expectorated)     Status: None   Collection Time: 10/15/17  8:12 AM  Result Value Ref Range Status   Specimen Description TRACHEAL ASPIRATE  Final   Special Requests Normal  Final   Gram Stain   Final    FEW WBC PRESENT, PREDOMINANTLY PMN RARE GRAM NEGATIVE RODS Performed at [facility], [place] [address], [place], [place] [address]    Culture FEW CANDIDA ALBICANS  Final   Report Status 10/18/2017 FINAL  Final  Culture, blood (Routine X 2) w Reflex to ID Panel     Status: None   Collection Time: 10/15/17  8:52 AM  Result Value Ref Range Status   Specimen Description  BLOOD RIGHT HAND  Final   Special Requests IN PEDIATRIC BOTTLE Blood Culture adequate volume  Final   Culture   Final    NO GROWTH 5 DAYS Performed at [facility], [place] [address] [place], [place] [address]    Report Status 10/20/2017 FINAL  Final  Culture, blood (Routine X 2) w Reflex to ID Panel     Status: None  Collection Time: 10/15/17 10:20 AM  Result Value Ref Range Status   Specimen Description BLOOD LEFT HAND  Final   Special Requests IN PEDIATRIC BOTTLE Blood Culture adequate volume  Final   Culture   Final    NO GROWTH 5 DAYS Performed at [facility], [place] [address], [place], [place] [address]    Report Status 10/20/2017 FINAL  Final  Culture, Urine     Status: None   Collection Time: 10/15/17 10:29 AM  Result Value Ref Range Status   Specimen Description URINE, CATHETERIZED  Final   Special Requests Normal  Final   Culture   Final    NO GROWTH Performed at [place] [address], [place], [place] [address]    Report Status 10/16/2017 FINAL  Final  Culture, respiratory (NON-Expectorated)     Status: None (Preliminary result)   Collection Time: 10/20/17  2:45 PM  Result Value Ref Range Status   Specimen Description TRACHEAL ASPIRATE  Final   Special Requests NONE  Final   Gram Stain   Final    MODERATE WBC PRESENT,BOTH PMN AND MONONUCLEAR RARE SQUAMOUS EPITHELIAL CELLS PRESENT NO ORGANISMS SEEN Performed at [facility], [place] [address] [place], [place] [address]    Culture PENDING  Incomplete   Report Status PENDING  Incomplete  C difficile quick scan w PCR reflex     Status: None   Collection Time: 10/20/17  2:56 PM  Result Value Ref Range Status   C Diff antigen NEGATIVE NEGATIVE Final   C Diff toxin NEGATIVE NEGATIVE Final   C Diff interpretation No C. difficile detected.  Final    Comment: Performed at [facility], [place] [address], [place], [place] [address]   Studies/Results: Dg Chest Port 1 View  Result Date:  10/21/2017 CLINICAL DATA:  Respiratory failure, status post cardioversion, placement of Impella left ventricular device EXAM: PORTABLE CHEST 1 VIEW COMPARISON:  10/20/2017 FINDINGS: Endotracheal tube terminates 2.5 cm above the carina. Cardiomegaly with mild interstitial edema. Moderate right pleural effusion with associated right lower lobe opacity, likely atelectasis. Small left pleural effusion. Right apical chest tube.  No pneumothorax is seen. Left subclavian venous catheter terminates in the mid SVC. Enteric tube courses into the stomach. Surgical clips overlying the right lateral chest wall/axilla with associated overlying skin staples. Interval removal of right IJ venous sheath. IMPRESSION: Endotracheal tube terminates 2.5 cm above the carina. Cardiomegaly with mild interstitial edema. Moderate right and small left pleural effusions. Right lower lobe opacity, likely atelectasis. Right apical chest tube.  No pneumothorax is seen. Additional support apparatus as above. Electronically Signed   By: Julian Hy M.D.   On: 10/21/2017 07:37   Dg Chest Port 1 View  Result Date: 10/20/2017 CLINICAL DATA:  Right line placement EXAM: PORTABLE CHEST 1 VIEW COMPARISON:  10/20/2017 FINDINGS: Endotracheal tube, right chest tube and left subclavian central line remain in place, unchanged. Interval removal of the Swan-Ganz catheter. Right internal jugular vascular sheath in place with the tip likely in the right innominate vein. Cardiomegaly with bilateral airspace opacities and layering effusions, similar prior study. IMPRESSION: Right vascular sheath tip in the right innominate vein. No pneumothorax. Continued bilateral airspace opacities and layering effusions. Electronically Signed   By: Rolm Baptise M.D.   On: 10/20/2017 12:22   Dg Chest Port 1 View  Result Date: 10/20/2017 CLINICAL DATA:  Replacement of endotracheal tube after patient pulled out EXAM: PORTABLE CHEST 1 VIEW COMPARISON:  10/20/2017 and  prior radiographs FINDINGS: Cardiomegaly again noted. An endotracheal tube with tip 2 cm above the carina, small bore feeding tube entering the stomach with tip off the field of view, right IJ Swan-Ganz catheter with tip overlying the right main pulmonary artery, left subclavian central venous catheter with tip overlying the upper SVC and right thoracostomy tube again noted. Pulmonary vascular congestion, pleural effusions, and bilateral lower lung consolidation/atelectasis are unchanged. A miniscule right apical pneumothorax is noted. IMPRESSION: 1. Little significant change in appearance of the chest except for miniscule right apical pneumothorax. Electronically Signed   By: Margarette Canada M.D.   On: 10/20/2017 09:35   Dg Chest Port 1 View  Result Date: 10/20/2017 CLINICAL DATA:  Respiratory failure EXAM: PORTABLE CHEST 1 VIEW COMPARISON:  10/19/2017 FINDINGS: Right chest tube and endotracheal tube as well as Swan-Ganz catheter and feeding tube are unchanged. Cardiomegaly with vascular congestion. Bilateral lower lobe airspace opacities and layering effusions, unchanged. No pneumothorax. IMPRESSION: No significant change since prior study. Electronically Signed   By: Rolm Baptise M.D.   On: 10/20/2017 07:22   Assessment/Plan:  INTERVAL HISTORY:  - Remains febrile on Metronidazole and Ceftraoline  - C. Diff negative and respiratory/tracheal aspiration showing no organisms on gram stain  - Leukocytosis resolved - Chest tube in place with ~550 mL out over the past 24 hours - Remains intubated and will start working towards trach  - Stopped the norepinephrine yesterday morning but had to restart it overnight  - Removed Swan catheter yesterday   ASSESSMENT: Bryan Harmon a 67 y.o.maleHFrEF and Atriat fibrillation who presented on 1/21in A-fib with RVR and low output heart failure. He has subsequently has had a complicated hospitalizationand persistent fevers since 1/30. Over the interval he  has had his Swan catheter removed and C. Diff testing is negative. Respiratory/tracheal aspiration showing no organisms on gram stain; however, blood cultures are still pending. At this point there still remains multiple potential sources for the patient's low grade fevers.   - Continue Metronidazole and Ceftraoline  - Consider decortication of the right loculated pleural effusion    LOS: 21 days   [facility] 10/21/2017, 8:22 AM

## 2017-10-21 NOTE — Progress Notes (Signed)
Orthopedic Tech Progress Note Patient Details:  Demaris Laverty [facility] 1951-04-18 [id number]  Ortho Devices Type of Ortho Device: Radio broadcast assistant Ortho Device/Splint Location: bilateral Ortho Device/Splint Interventions: Application   Post Interventions Patient Tolerated: Well Instructions Provided: Care of device   Nikki Dom 10/21/2017, 11:35 AM

## 2017-10-21 NOTE — Progress Notes (Signed)
ANTICOAGULATION CONSULT NOTE - Follow Up Consult  Pharmacy Consult for Heparin Indication: atrial fibrillation and LA thrombus  Allergies  Allergen Reactions  . Fentanyl And Related Other (See Comments)    Patients eyes roll in the back of his head and patient has seizure like activity    Patient Measurements: Height: 5\' 10"  (177.8 cm) Weight: 217 lb (98.4 kg) IBW/kg (Calculated) : 73   Vital Signs: Temp: 100.9 F (38.3 C) (02/11 0400) Temp Source: Core (Comment) (02/11 0400) BP: 100/84 (02/11 0700) Pulse Rate: 120 (02/11 0730)  Labs: Recent Labs    10/19/17 0304  10/20/17 0230 10/20/17 1617 10/21/17 0354  HGB 7.6*   < > 8.4* 8.2* 8.9*  HCT 24.4*   < > 26.8* 24.0* 28.1*  PLT 216  --  265  --  345  HEPARINUNFRC 0.30  --  0.32  --  0.39  CREATININE 2.64*   < > 2.91* 3.00* 3.05*   < > = values in this interval not displayed.    Estimated Creatinine Clearance: 28.4 mL/min (A) (by C-G formula based on SCr of 3.05 mg/dL (H)).    Assessment: 66yom known to pharmacy for heparin management with impella. Impella removed 2/6 due to stoppage from small vegetation that got sucked into the catheter. Heparin resumed ~ 4 hours after removal for afib and LA thrombus.   Heparin level slightly higher than goal 0.39, on 2150 units/hr. Hgb stable ~8.9, plts stable. Chest tube still with bloody drainage but slowing (553 mL overnight). No infusion issues per nursing.  Goal of Therapy:  Heparin level 0.3 units/ml Monitor platelets by anticoagulation protocol: Yes   Plan:  1) Decrease heparin infusion to 2050 units/hr 2) Obtain heparin level 8 hrs later 3) Daily heparin level and CBC  Girard Cooter, PharmD Clinical Pharmacist  Pager: [phone] Clinical Phone for 10/21/2017 until 3:30pm: [phone] If after 3:30pm, please call main pharmacy at [phone] 10/21/2017 8:14 AM

## 2017-10-21 NOTE — Progress Notes (Signed)
Patient ID: Bryan Harmon, male   DOB: 1951-03-04, 67 y.o.   MRN: [id number]     Advanced Heart Failure Rounding Note  Primary Cardiologist: Aundra Dubin   Subjective:    Impella 5.0 placed in the OR 1/30.  Chest tube placed on right 1/31.   2/4 ID consulted for ongoing fever. 2/4 Neuro consulted for ?seizure. CT of head with no acute findings. EEG without seizure activity.  Echo for Impella placement on 2/5 showed a small vegetation on a mitral valve chord.   2/5 CT of chest/abd/pelvis: Worsening pulmonary infiltrates.  Necrotizing PNA on right with complex pleural effusion. Also with aggressive left base PNA.    2/6 Impella removed due to blockage => suspect small vegetation on MV chord was drawn into the Impella, stopping the device.   Remains on amio 30 mg/hr, milrinone 0.25 mcg, norepi 10 this am mcg. Remains intubated.  Trying to wean vent today and discussing possible trach.   Tm 100.9 last night.   Serosanguinous drainage from right chest tube.   Creatinine 2.2>2.5 >2.6>2.7>2.49 > 2.6 > 2.9 > 3.05  CVP 7-8. Coox 57.4%.   Echo: Severe LV dilation, EF 15-20%, severely dilated RV with severely decreased RV systolic function, moderate-severe MR, moderate-severe TR, cannot rule out LV apical thrombus.  Repeat echo with Definity showed definite LV apical thrombus.   TEE (1/28): EF 10-15%, diffuse hypokinesis, small LV apical thrombus, mildly dilated RV with moderately decreased systolic function, no LAA thrombus.    Objective:   Weight Range: 217 lb (98.4 kg) Body mass index is 31.14 kg/m.   Vital Signs:   Temp:  [98.3 F (36.8 C)-100.9 F (38.3 C)] 100.9 F (38.3 C) (02/11 0400) Pulse Rate:  [25-147] 120 (02/11 0800) Resp:  [13-24] 19 (02/11 0800) BP: (76-111)/(50-84) 111/65 (02/11 0800) SpO2:  [91 %-97 %] 95 % (02/11 0800) Arterial Line BP: (83-114)/(48-66) 102/66 (02/11 0700) FiO2 (%):  [40 %] 40 % (02/11 0730) Weight:  [217 lb (98.4 kg)] 217 lb (98.4 kg) (02/11  0500) Last BM Date: 10/20/17  Weight change: Filed Weights   10/19/17 0500 10/20/17 0620 10/21/17 0500  Weight: 214 lb (97.1 kg) 203 lb (92.1 kg) 217 lb (98.4 kg)    Intake/Output:   Intake/Output Summary (Last 24 hours) at 10/21/2017 0908 Last data filed at 10/21/2017 0800 Gross per 24 hour  Intake 4473.47 ml  Output 3598 ml  Net 875.47 ml      Physical Exam  CVP 7-8  General: Intubated/sedated.  HEENT: + ETT Neck: Supple. JVP 7-8. Carotids 2+ bilat; no bruits. No thyromegaly or nodule noted. Cor: PMI nondisplaced. RRR, 2/6 HSM Apex. Soft 1-2+ edema to knees. 1+ BUE edema.  Lungs: Diminished basilar sounds. Mechanical breathing sounds.  Abdomen: Soft, non-tender, non-distended, no HSM. No bruits or masses. +BS  Extremities: No cyanosis, clubbing, or rash.  Neuro: Intubated/sedated   Telemetry   A fib 110-120s, personally reviewed.   EKG    No new tracings.   Labs    CBC Recent Labs    10/20/17 0230 10/20/17 1617 10/21/17 0354  WBC 9.2  --  9.1  NEUTROABS 8.2*  --  8.1*  HGB 8.4* 8.2* 8.9*  HCT 26.8* 24.0* 28.1*  MCV 96.8  --  97.2  PLT 265  --  892   Basic Metabolic Panel Recent Labs    10/20/17 0230 10/20/17 1617 10/21/17 0354  NA 144 146* 145  K 3.8 4.0 4.0  CL 113* 110 113*  CO2  21*  --  21*  GLUCOSE 139* 126* 174*  BUN 93* 85* 94*  CREATININE 2.91* 3.00* 3.05*  CALCIUM 7.3*  --  7.5*  MG 2.3  --  2.2  PHOS 4.5  --  5.2*   Liver Function Tests No results for input(s): AST, ALT, ALKPHOS, BILITOT, PROT, ALBUMIN in the last 72 hours. No results for input(s): LIPASE, AMYLASE in the last 72 hours. Cardiac Enzymes No results for input(s): CKTOTAL, CKMB, CKMBINDEX, TROPONINI in the last 72 hours.  BNP: BNP (last 3 results) Recent Labs    12/13/16 0949 02/11/17 1049 09/13/2017 1218  BNP 369.2* 562.7* 2,485.9*    ProBNP (last 3 results) No results for input(s): PROBNP in the last 8760 hours.   D-Dimer No results for input(s):  DDIMER in the last 72 hours. Hemoglobin A1C No results for input(s): HGBA1C in the last 72 hours. Fasting Lipid Panel No results for input(s): CHOL, HDL, LDLCALC, TRIG, CHOLHDL, LDLDIRECT in the last 72 hours. Thyroid Function Tests No results for input(s): TSH, T4TOTAL, T3FREE, THYROIDAB in the last 72 hours.  Invalid input(s): FREET3  Other results:   Imaging    Dg Chest Port 1 View  Result Date: 10/21/2017 CLINICAL DATA:  Respiratory failure, status post cardioversion, placement of Impella left ventricular device EXAM: PORTABLE CHEST 1 VIEW COMPARISON:  10/20/2017 FINDINGS: Endotracheal tube terminates 2.5 cm above the carina. Cardiomegaly with mild interstitial edema. Moderate right pleural effusion with associated right lower lobe opacity, likely atelectasis. Small left pleural effusion. Right apical chest tube.  No pneumothorax is seen. Left subclavian venous catheter terminates in the mid SVC. Enteric tube courses into the stomach. Surgical clips overlying the right lateral chest wall/axilla with associated overlying skin staples. Interval removal of right IJ venous sheath. IMPRESSION: Endotracheal tube terminates 2.5 cm above the carina. Cardiomegaly with mild interstitial edema. Moderate right and small left pleural effusions. Right lower lobe opacity, likely atelectasis. Right apical chest tube.  No pneumothorax is seen. Additional support apparatus as above. Electronically Signed   By: Julian Hy M.D.   On: 10/21/2017 07:37   Dg Chest Port 1 View  Result Date: 10/20/2017 CLINICAL DATA:  Right line placement EXAM: PORTABLE CHEST 1 VIEW COMPARISON:  10/20/2017 FINDINGS: Endotracheal tube, right chest tube and left subclavian central line remain in place, unchanged. Interval removal of the Swan-Ganz catheter. Right internal jugular vascular sheath in place with the tip likely in the right innominate vein. Cardiomegaly with bilateral airspace opacities and layering effusions,  similar prior study. IMPRESSION: Right vascular sheath tip in the right innominate vein. No pneumothorax. Continued bilateral airspace opacities and layering effusions. Electronically Signed   By: Rolm Baptise M.D.   On: 10/20/2017 12:22   Dg Chest Port 1 View  Result Date: 10/20/2017 CLINICAL DATA:  Replacement of endotracheal tube after patient pulled out EXAM: PORTABLE CHEST 1 VIEW COMPARISON:  10/20/2017 and prior radiographs FINDINGS: Cardiomegaly again noted. An endotracheal tube with tip 2 cm above the carina, small bore feeding tube entering the stomach with tip off the field of view, right IJ Swan-Ganz catheter with tip overlying the right main pulmonary artery, left subclavian central venous catheter with tip overlying the upper SVC and right thoracostomy tube again noted. Pulmonary vascular congestion, pleural effusions, and bilateral lower lung consolidation/atelectasis are unchanged. A miniscule right apical pneumothorax is noted. IMPRESSION: 1. Little significant change in appearance of the chest except for miniscule right apical pneumothorax. Electronically Signed   By: Cleatis Polka.D.  On: 10/20/2017 09:35     Medications:     Scheduled Medications: . chlorhexidine gluconate (MEDLINE KIT)  15 mL Mouth Rinse BID  . Chlorhexidine Gluconate Cloth  6 each Topical Daily  . feeding supplement (PRO-STAT SUGAR FREE 64)  30 mL Per Tube TID  . Gerhardt's butt cream   Topical BID  . insulin aspart  2-6 Units Subcutaneous Q4H  . insulin glargine  12 Units Subcutaneous BID  . mouth rinse  15 mL Mouth Rinse 10 times per day  . sennosides  5 mL Oral Daily  . sodium chloride flush  3 mL Intravenous Q12H    Infusions: . sodium chloride 20 mL/hr at 10/15/17 0608  . amiodarone 30 mg/hr (10/21/17 0800)  . ceFTAROline (TEFLARO) IV Stopped (10/21/17 8756)  . dexmedetomidine (PRECEDEX) IV infusion for high rates 1 mcg/kg/hr (10/21/17 0800)  . famotidine (PEPCID) IV Stopped (10/20/17 1015)  .  feeding supplement (VITAL 1.5 CAL) 1,000 mL (10/21/17 0800)  . heparin 2,050 Units/hr (10/21/17 0855)  . metronidazole Stopped (10/21/17 0435)  . milrinone 0.25 mcg/kg/min (10/21/17 0800)  . norepinephrine (LEVOPHED) Adult infusion 10 mcg/min (10/21/17 0840)    PRN Medications: sodium chloride, acetaminophen (TYLENOL) oral liquid 160 mg/5 mL, albuterol, magic mouthwash, midazolam, morphine injection, ondansetron (ZOFRAN) IV, sodium chloride flush, sodium chloride flush    Patient Profile  Mr Penna is a 67 year old with a history of PAF S/P DC-CV 11/2016, s/p bilateral inguinal hernia repair 11/2017, PVCs, HTN, NICM, chronic systolic heart failure.   Sent from Urgent Care with A fib RVR. Acutely SOB on arrival   Assessment/Plan   1. Acute hypoxemic respiratory failure: RLL PNA, Strep pneumoniae in blood cultures. Respiratory cultures with parainfluenzae virus. Also acute/chronic systolic CHF.Now intubated s/p Impella 5.0 placement, multifocal PNA on CXR and CT chest. Remains critically ill.  Antibiotics broadened from ceftriaxone to vancomycin/meropenem on 1/30. BAL with Candida albicans, anidulafungin started 2/2.  Necrotizing PNA with complex pleural effusion on right on 2/5 CT, anti-microbials changed to ceftaroline and Flagyl on 2/5. Has had PSV trials daily.   - Right chest tube placed 1/31, still with serosanguineous drainage. - Will need tracheostomy.   No change.  2.Acute on chronic systolic CHF-> cardiogenic shock : Nonischemic cardiomyopathy. 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 Etiology of his CHF is not clear =>no definite inciting event. He has a history of HTN but doubt this was the only trigger. 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 This would ideally be confirmed by cMRI, but he has not wanted an MRI (concerned about side effects). He also has frequent PVCs, 21% total on last  holter in 4/18 which is a risk for fall in EF.No family history of CMP. Cannot rule out viral myocarditis. SPEP negative. With medical management, he initially felt a lot better. However, he quit all his meds in early 2018 with recurrence of NYHA III symptoms and onset of atrial fibrillation.He had TEE-DCCV in 3/18.TEE showed that EF remained25%. He quit his meds again in 4/18 and apparently did not restart them when I asked him to in 6/18. No meds probably since 4/18. Echo this admission with EF 15-20%, severe RV dysfunction, LV apical thrombus. RV is hypokinetic.  Attempted DCCV on 1/28 failed likely due to pressors/inotropes.  Thornton 1/29 with low cardiac output => Impella 5.0 placed 1/30. On 2/6, Impella removed due to pump stop (suspect vegetation from MV chord sucked into Impella). Suspect combination  of septic/vasogenic shock and cardiogenic shock.  Swan out on 2/10.  - Coox 57.4% on milrinone 0.25 mcg/kg/min and Norepi 10.  - Hold lasix today with rise in BUN/creatinine today, CVP is 7-8. Unna boots with significant peripheral edema.  - Continue milrinone 0.25.  Stopped norepi yesterday but back on this evening.  - End point still difficult to envision => would be poor LVAD candidate with renal failure and RV failure.   3. Atrial fibrillation:Persistent, now with RVR. HR up to 140sinitially, now 110s on amiodarone gtt at30. Not sure how long he has been in atrial fibrillation with RVR, but this may have triggered his CHF and AKI due to worsening of cardiac output.  -Heparin gtt ongoing. - Continue amiodarone gtt at 30 mg/hr.   - Failed TEE-guided DCCV on 1/28 in setting of vasoactive meds.  If we can wean down milrinone/norepinephrine, will re-attempt DCCV in future (will need TEE) => possibly Wednesday - He remains on milrinone 0.25. Norepi added back overnight.  4. AKI: Creatinine 1.08 in 6/18, he has not been on any meds. Suspect cardiorenal syndrome with afib/RVR and fall in  cardiac output.He was initially hyperkalemic and acidotic. - BUN/Cr up today. Hold diuretics with CVP 7-8.  5. FE:OFHQR pneumo PNA as well as parainfluenza virus.CT chest 1/29 with multifocal PNA. On  1/30 to vancomycin/meropenem, then anidulafungin added.  ID has seen. Now on Teflaro and metronidazole. CT of chest/abd/pelvis 2/5 concerning for necrotizing right pneumonia with surrounding complex large pleural effusion.  He has a chest tube with ongoing serosanguineous drainage. 2/5 showed a small vegetation on a mitral valve chord.  Repeat ECHO on 2/6 did not show vegetation. Assumed it was pulled into Impella.  He is now off [employer] but febrile to 100.9 again last night. - Continue current antibiotics, so far additional culture negative.  WBCs trending down overall.  - ID plans to continue current ABX. Luiz Blare now out.  6. Elevated LFTs: Suspect shock liver type picture. - AST/ALT > 1000 at admission but have trended down steadily. No change.  7. LV thrombus: Noted on TTE and TEE, small.  - Remains on heparin.   8. Hyponatremia:  - Resolving. Na 145 this am.  9. Malnutrition: tube feeds on -going 10. Anemia: Significant blood loss via chest tube and mouth.   Received 2UPRBCs 2/3, 2/4 5 uPRBCs, 2/5 2UPRBCs. 2/6 2U PRBCs. 1 unit PRBCs 2/9.  Hgb stable today. 11. Suspected Deep Tissue Injury -->Sacrum/R buttock/Mid back . Continue to repostion R/L. WOC consult appreciated. 12. Seizure- Neurology appreciated. CT head negative. EEG no seizure noted.  No change.   Patient is critically ill and remains in multisystem organ failure.  Family wishes to continue aggressive treatment.  He is partial code for now. (No CPR but OK for ACLS meds)  Annamaria Helling 10/21/2017 9:08 AM  Advanced Heart Failure Team Pager [phone] (M-F; 7a - 4p)  Please contact [facility] for night-coverage after hours (4p -7a ) and weekends on [email or web address]  Patient seen with PA, agree with the above note.   He continues to have low grade fevers, seen by ID yesterday with plan to continue current abx (ceftaroline and Flagyl).  Luiz Blare is out.    Creatinine up to 3.  Co-ox 57% today with CVP 7-8.  Will hold Lasix today.  Continue current milrinone at 0.25.  He is on norepinephrine with dose fluctuating, can titrate down as BP allows.   He is on PSV 10 today.  On exam, he awakens and follows commands.  2+ edema of lower legs and forearms.  JVP not elevated.  Decreased BS at bases.   He remains critically ill.  Worsening creatinine is concerning.  Will need Unna boots for lower legs with worsening edema.  Poor long-term prognosis, family still wants full aggressive care.  If renal function continues to worsen, not a good HD candidate. He remains partial code.  Pulmonary to see, will need to decide on tracheostomy.   CRITICAL CARE Performed by: Loralie Champagne  Total critical care time: 35 minutes  Critical care time was exclusive of separately billable procedures and treating other patients.  Critical care was necessary to treat or prevent imminent or life-threatening deterioration.  Critical care was time spent personally by me on the following activities: development of treatment plan with patient and/or surrogate as well as nursing, discussions with consultants, evaluation of patient's response to treatment, examination of patient, obtaining history from patient or surrogate, ordering and performing treatments and interventions, ordering and review of laboratory studies, ordering and review of radiographic studies, pulse oximetry and re-evaluation of patient's condition.  Loralie Champagne 10/21/2017 11:00 AM

## 2017-10-21 NOTE — Progress Notes (Signed)
PULMONARY / CRITICAL CARE MEDICINE   Name: EDGAR REISZ MRN: [id number] DOB: 20-Apr-1951    ADMISSION DATE:  10/01/2017  REFERRING MD:  Nils Pyle  CHIEF COMPLAINT:  Fatigue  HISTORY OF PRESENT ILLNESS:   67 y/o male with non-ischemic cardiomyopathy and afib admitted with decompensated heart failure.  Required Impella, vent.    Lines/ Tubes 1/30 L IJ CVL > 1/31 1/30 R IJ swan > 1/30 R sub clav impella > 10/16/2017 1/30 L radial arterial line > 10/15/2017 1/30 ETT >  1/31 L Lillie TLC >  10/15/2017 right femoral arterial line placed>>  Cultures: 1/21 Blood >> Strep Pneumoniae >> pan sens 1/21 Resp Viral Panel >> + Parainfluenza Virus 3 1/28 blood > ng 1/30 blood > ng 1/31 resp culture > ng 2/1 BAL > candida 10/15/2017 blood cultures x2>> 10/15/2017 tracheal aspirate>> rare gram-negative rods few Candida albicans 10/15/2017 urine culture>> negative  ABX: Infectious disease consult 10/14/2017 Rocephin 1/21 >> 1/29 Doxy 1/21 >> 1/23 Vanc 1/30 >> 10/14/2017 Mero 1/30 >> 10/15/2017 Eraxis 2/2 >> off Teflaro 10/15/2017>> Flagyl 10/15/2017>>  Studies: 10/01/2017>> Echo EF 15-20%, Apex ? PAP muscle vs. Thrombus, LV severely dilated, moderate concentric hypertrophy, systolic function normal, unable to evaluate LV diastolic function due to atrial fibrillation. + moderate spontaneous echo contrast, indicative of stasis Impression 1/23 Echo > Severely dilated LV with severe LV dysfunction globally with EF 15-20%. Severely dialted RV with severe RV dysfunction. Moderate to severe MR with ERO 0.33cm2 and MR volume 26m. Moderate tosevere TR with moderate pulmonary HTN. Moderately thickened and calcified AV leaflets with mild MR, mildly dilated aortic root, massive biatrial enlargement. Cannot rule out LV thrombus. Thereis significant spontaneous echo contrast in LV c/w sluggish blood flow. The right ventricular systolic pressure was increasedconsistent with moderate pulmonary hypertension. 1/29 CT  chest > dense bilateral lower lobe consolidation R>L 1/30 TEE >> LVEF 20-25%, impella in place, poor overall contractility but LV improved  SUBJECTIVE:   More awake, appears more comfortable on ventilator.  Continues to have adequate cardiac index with 3 noted on 10/20/2017  VITAL SIGNS: BP 95/74   Pulse (!) 124   Temp (!) 100.9 F (38.3 C) (Core (Comment))   Resp 19   Ht _0  (1.778 m)   Wt 98.4 kg (217 lb)   SpO2 95%   BMI 31.14 kg/m   HEMODYNAMICS: PAP: (40-42)/(28) 42/28 CVP:  [1 mmHg-16 mmHg] 12 mmHg  VENTILATOR SETTINGS: Vent Mode: PSV;CPAP FiO2 (%):  [40 %] 40 % Set Rate:  [10 bmp] 10 bmp PEEP:  [5 cmH20] 5 cmH20 Pressure Support:  [10 cmH20] 10 cmH20 Plateau Pressure:  [16 cmH20-17 cmH20] 16 cmH20  INTAKE / OUTPUT: I/O last 3 completed shifts: In: 6722.4 [I.V.:2712.4; Other:150; NG/GT:2660; IV Piggyback:1200] Out: 50881[Urine:4140; Stool:625; Chest Tube:877]  PHYSICAL EXAMINATION: General: Frail ill-appearing male is more awake today HEENT: Endotracheal tube connected to ventilator, feeding tube in place PSY: Dull effect Neuro: Follows commands x4 CV: Heart sounds are regular heart rate of 114 atrial fibrillation PULM: Decreased air hunger, increased air movement, decreased breath sounds at the base GJS:RPRX non-tender, bsx4 active  Extremities: warm/dry, decreased edema  Skin: Skin breakdown noted at multiple pressure points.  LABS:  BMET Recent Labs  Lab 10/19/17 0304  10/20/17 0230 10/20/17 1617 10/21/17 0354  NA 144   < > 144 146* 145  K 4.0   < > 3.8 4.0 4.0  CL 111   < > 113* 110 113*  CO2 22  --  21*  --  21*  BUN 80*   < > 93* 85* 94*  CREATININE 2.64*   < > 2.91* 3.00* 3.05*  GLUCOSE 136*   < > 139* 126* 174*   < > = values in this interval not displayed.    Electrolytes Recent Labs  Lab 10/19/17 0304 10/20/17 0230 10/21/17 0354  CALCIUM 7.3* 7.3* 7.5*  MG 2.4 2.3 2.2  PHOS 3.7 4.5 5.2*    CBC Recent Labs  Lab  10/19/17 0304  10/20/17 0230 10/20/17 1617 10/21/17 0354  WBC 11.5*  --  9.2  --  9.1  HGB 7.6*   < > 8.4* 8.2* 8.9*  HCT 24.4*   < > 26.8* 24.0* 28.1*  PLT 216  --  265  --  345   < > = values in this interval not displayed.    Coag's Recent Labs  Lab 10/16/17 0404 10/17/17 0454 10/18/17 0400  APTT 82* 72* 98*    Sepsis Markers No results for input(s): LATICACIDVEN, PROCALCITON, O2SATVEN in the last 168 hours.  ABG Recent Labs  Lab 10/19/17 0327 10/19/17 1645 10/20/17 0537  PHART 7.472* 7.457* 7.388  PCO2ART 35.7 28.7* 33.3  PO2ART 119.0* 76.0* 89.0    Liver Enzymes Recent Labs  Lab 10/15/17 1029  AST 68*  ALT 44  ALKPHOS 75  BILITOT 1.8*  ALBUMIN 1.6*    Cardiac Enzymes No results for input(s): TROPONINI, PROBNP in the last 168 hours.  Glucose Recent Labs  Lab 10/20/17 1155 10/20/17 1614 10/20/17 2041 10/21/17 0051 10/21/17 0407 10/21/17 0816  GLUCAP 111* 123* 115* 152* 159* 119*    Imaging Dg Chest Port 1 View  Result Date: 10/21/2017 CLINICAL DATA:  Respiratory failure, status post cardioversion, placement of Impella left ventricular device EXAM: PORTABLE CHEST 1 VIEW COMPARISON:  10/20/2017 FINDINGS: Endotracheal tube terminates 2.5 cm above the carina. Cardiomegaly with mild interstitial edema. Moderate right pleural effusion with associated right lower lobe opacity, likely atelectasis. Small left pleural effusion. Right apical chest tube.  No pneumothorax is seen. Left subclavian venous catheter terminates in the mid SVC. Enteric tube courses into the stomach. Surgical clips overlying the right lateral chest wall/axilla with associated overlying skin staples. Interval removal of right IJ venous sheath. IMPRESSION: Endotracheal tube terminates 2.5 cm above the carina. Cardiomegaly with mild interstitial edema. Moderate right and small left pleural effusions. Right lower lobe opacity, likely atelectasis. Right apical chest tube.  No pneumothorax is  seen. Additional support apparatus as above. Electronically Signed   By: Julian Hy M.D.   On: 10/21/2017 07:37   Dg Chest Port 1 View  Result Date: 10/20/2017 CLINICAL DATA:  Right line placement EXAM: PORTABLE CHEST 1 VIEW COMPARISON:  10/20/2017 FINDINGS: Endotracheal tube, right chest tube and left subclavian central line remain in place, unchanged. Interval removal of the Swan-Ganz catheter. Right internal jugular vascular sheath in place with the tip likely in the right innominate vein. Cardiomegaly with bilateral airspace opacities and layering effusions, similar prior study. IMPRESSION: Right vascular sheath tip in the right innominate vein. No pneumothorax. Continued bilateral airspace opacities and layering effusions. Electronically Signed   By: Rolm Baptise M.D.   On: 10/20/2017 12:22   DISCUSSION: 67 y/o male with ischemic cardiomyopathy admitted with decompensated systolic heart failure in the setting of strep pneumo pneumonia and bacteremia s/p impella.  He has acute on chronic kidney failure, cardiogenic shock.  10/15/2017 with fever 105.6, pancultured 01/2018, ID consult 10/14/2017. 's overall cardiac function has declined  despite all interventions.  Family wants to continue with maximum interventions.  10/18/2016 CODE STATUS changed to a limited with no chest compressions but all other interventions will be continued.  Despite his poor prognosis he is continued to improve therefore possibility of tracheostomy may be undertaken within 72 hours. 10/19/2017 no significant change in status.  I do note there is some having been placed on pressure regulated volume control mode of ventilation which was returned to written orders for pressure control ventilation on 10/19/2017. His hemoglobin is drifting down he may be ready for follow-up transfusion 10/20/2017 continues to slowly improve.  Massive anasarca has improved despite being a positive I&O.  His Levophed is weaning.  Plan for tracheostomy in  the next 24-72 hours  ASSESSMENT / PLAN:  PULMONARY A: Acute respiratory failure with hypoxemia Pneumococcal CAP - RLL  Acute pulmonary edema Bilateral pleural effusions , R >> L P:   Continue PS as able but no extubation given renal function and fluid status Will need trach if family wishes to continue for full support Titrate O2 for sat of 88-92% Would not extubate given fluid status and mental status  CARDIOVASCULAR A:  Acute decompensated cardiomyopathy > severe biventricular failure, LVEF ~ 5% S/p Impella 1/30 with removal on 10/16/2017 after failure most likely from vegetation been ingested by the Impella intake. Cardiogenic Shock.  Multiple vasopressor dependent Medical Non-Compliance - stopped taking all meds prior to admit for CHF, ? Long term plan 10/11/2017 right chest tube P:  Volume management, inotrope management as per advanced heart failure team plans, TCTS plans.  Note CVP is 8, milrinone is being weaned on 10/18/2017 Hold lasix for deteriorating renal function Ct levophed which is being weaned also./ milrinone gtt Amiodarone drip, if stabilizes consider cardioversion in the future. Poor prognosis but I do not think family is understanding how dire the situation is CT to water seal  RENAL Lab Results  Component Value Date   CREATININE 3.05 (H) 10/21/2017   CREATININE 3.00 (H) 10/20/2017   CREATININE 2.91 (H) 10/20/2017   CREATININE 1.11 03/25/2012   Recent Labs  Lab 10/20/17 0230 10/20/17 1617 10/21/17 0354  K 3.8 4.0 4.0   Recent Labs  Lab 10/20/17 0230 10/20/17 1617 10/21/17 0354  NA 144 146* 145   Intake/Output Summary (Last 24 hours) at 10/21/2017 1028 Last data filed at 10/21/2017 0900 Gross per 24 hour  Intake 4484.82 ml  Output 3465 ml  Net 1019.82 ml   A:   Stable renal failure  Hypokalemia/hypervolemic P:   Continue to follow urine output, BMP Replace electrolytes as indicated Avoid nephrotoxic agents, ensure adequate renal  perfusion Hold further lasix Doubt is a dialysis candidate but will hold off calling renal for now BMET in AM  GASTROINTESTINAL A:   No acute issues P:   Tube feeding per nutrition Pepcid IV for ulcer prophylaxis  HEMATOLOGIC Recent Labs    10/20/17 1617 10/21/17 0354  HGB 8.2* 8.9*   Lab Results  Component Value Date   INR 1.33 09/10/2017   INR 1.10 07/05/2016   INR 0.98 10/08/2012   A:   Anemia, thrombocytopenia - blood loss via chest tube Concern for hemolytic anemia with impella, note high LDH P:  PRBCs per CHF service, goal Hb 8 & above Transfused 4 units of packed cells 10/14/2017 Follow CBC and transfusion per cardiology service with 2 units of packed cells on 10/15/2017 Remains on heparin Right chest tube intermittently dumps up to 150 cc of bloody drainage.  10/19/2017 dumped 400 cc bloody drainage from chest tube during the night, hemoglobin noted to be drifting down. Consider transfusion we will leave this to cardiology at this time.  Spoke with cardiologist plans to transfuse.  10/19/2017 10/20/2017 hemoglobin is improved posttransfusion, he continues to have chest tube drainage is bloody approximately 5-600 cc per 24 hours  INFECTIOUS A:   Strep Pneumonia Bacteremia - 1/21 - completed 10 ds of rocephin Severe CAP HCAP? >  No evidence thus far based on BAL from 2/1 Fever 1/30 - new, considering RLL, sinusitis with NGT, multiple lines Parainfluenza virus pneumonia Persistent right pleural effusion appears complex and could be empyema despite right chest tube 10/19/2017 T-max 101 during the night.  He has been followed by infectious disease no cultures are positive at this time. P:   Vancomycin discontinued on 10/14/2017 Continue meropenem off Infectious disease consult 10/14/2017 appreciated 10/17/2017 T-max 98.4 Currently on Teflaro and Flagyl per ID both  started on 10/15/2017 Suspect that yeast in BAL is a contaminant, empiric  Consider drug fever - ID consulted ,note  pct was high Note 10/15/2017 fever was 105.6 he was placed on Arctic sun for cooling. 10/15/2017 due to fever spike multiple invasive catheters we will pan culture 10/15/2017 Spoke with cardiovascular thoracic surgeons 10/16/2017 no indication for further chest tube.  Question of lytic therapy in the future.  Note 2//2019 chest 2 continues to drain approximately 30-50 cc an hour bloody drainage.  10/19/2017 T-max 101 Note: 10/19/2017 was noted to have down to approximately 400 cc via chest tubes during the night.  Hemoglobin remained stable remains on a heparin drip. 10/20/2017 T-max 100.4, chest tube drainage 600 cc in 24 hours.  ENDOCRINE CBG (last 3)  Recent Labs    10/21/17 0051 10/21/17 0407 10/21/17 0816  GLUCAP 152* 159* 119*   A:   Hyperglycemia well controlled P:   Sliding scale insulin per protocol  NEUROLOGIC A:   Sedation Need / Mechanical Ventilation  Suspected seizure 10/14/2017 neurology consult Suspect he may be secondary to febrile state. EEG performed P:   RASS goal: -1  Sedation protocol: -1> continue precedex at lower dose, PRN fentanyl, versed  EEG with no seizure activity noted.  Neurology has signed off as of 10/16/2017 10/20/2017 awake and follows commands no further seizure activity.  Much more awake  FAMILY  - Updates: 2/11 spoke with wife, not able to comprehend how sick and terminal patient.  Son is to arrive for further discussions.  The patient is critically ill with multiple organ systems failure and requires high complexity decision making for assessment and support, frequent evaluation and titration of therapies, application of advanced monitoring technologies and extensive interpretation of multiple databases.   Critical Care Time devoted to patient care services described in this note is  35  Minutes. This time reflects time of care of this signee Dr Jennet Maduro. This critical care time does not reflect procedure time, or teaching time or supervisory time of  PA/NP/Med student/Med Resident etc but could involve care discussion time.  Rush Farmer, M.D. [facility] Pulmonary/Critical Care Medicine. Pager: [phone]. After hours pager: [phone].

## 2017-10-22 DIAGNOSIS — J9600 Acute respiratory failure, unspecified whether with hypoxia or hypercapnia: Secondary | ICD-10-CM

## 2017-10-22 DIAGNOSIS — Z515 Encounter for palliative care: Secondary | ICD-10-CM

## 2017-10-22 DIAGNOSIS — Z7189 Other specified counseling: Secondary | ICD-10-CM

## 2017-10-22 LAB — CBC WITH DIFFERENTIAL/PLATELET
BASOS ABS: 0 10*3/uL (ref 0.0–0.1)
BASOS PCT: 0 %
EOS ABS: 0 10*3/uL (ref 0.0–0.7)
Eosinophils Relative: 0 %
HEMATOCRIT: 27.8 % — AB (ref 39.0–52.0)
HEMOGLOBIN: 8.8 g/dL — AB (ref 13.0–17.0)
Lymphocytes Relative: 8 %
Lymphs Abs: 0.7 10*3/uL (ref 0.7–4.0)
MCH: 31 pg (ref 26.0–34.0)
MCHC: 31.7 g/dL (ref 30.0–36.0)
MCV: 97.9 fL (ref 78.0–100.0)
MONOS PCT: 1 %
Monocytes Absolute: 0.1 10*3/uL (ref 0.1–1.0)
NEUTROS ABS: 7.6 10*3/uL (ref 1.7–7.7)
NEUTROS PCT: 91 %
Platelets: 327 10*3/uL (ref 150–400)
RBC: 2.84 MIL/uL — ABNORMAL LOW (ref 4.22–5.81)
RDW: 20.4 % — AB (ref 11.5–15.5)
WBC: 8.4 10*3/uL (ref 4.0–10.5)

## 2017-10-22 LAB — GLUCOSE, CAPILLARY
GLUCOSE-CAPILLARY: 150 mg/dL — AB (ref 65–99)
GLUCOSE-CAPILLARY: 90 mg/dL (ref 65–99)
GLUCOSE-CAPILLARY: 141 mg/dL — AB (ref 65–99)

## 2017-10-22 LAB — POCT I-STAT 4, (NA,K, GLUC, HGB,HCT)
Glucose, Bld: 143 mg/dL — ABNORMAL HIGH (ref 65–99)
HCT: 27 % — ABNORMAL LOW (ref 39.0–52.0)
Hemoglobin: 9.2 g/dL — ABNORMAL LOW (ref 13.0–17.0)
Potassium: 4.3 mmol/L (ref 3.5–5.1)
Sodium: 150 mmol/L — ABNORMAL HIGH (ref 135–145)

## 2017-10-22 LAB — CULTURE, RESPIRATORY: CULTURE: NO GROWTH

## 2017-10-22 LAB — BASIC METABOLIC PANEL
Anion gap: 11 (ref 5–15)
BUN: 98 mg/dL — ABNORMAL HIGH (ref 6–20)
CALCIUM: 7.5 mg/dL — AB (ref 8.9–10.3)
CHLORIDE: 116 mmol/L — AB (ref 101–111)
CO2: 20 mmol/L — ABNORMAL LOW (ref 22–32)
CREATININE: 3.18 mg/dL — AB (ref 0.61–1.24)
GFR calc non Af Amer: 19 mL/min — ABNORMAL LOW (ref >60)
GFR, EST AFRICAN AMERICAN: 22 mL/min — AB (ref >60)
Glucose, Bld: 106 mg/dL — ABNORMAL HIGH (ref 65–99)
Potassium: 3.8 mmol/L (ref 3.5–5.1)
SODIUM: 147 mmol/L — AB (ref 135–145)

## 2017-10-22 LAB — CULTURE, RESPIRATORY W GRAM STAIN

## 2017-10-22 LAB — POCT I-STAT 3, ART BLOOD GAS (G3+)
Acid-base deficit: 5 mmol/L — ABNORMAL HIGH (ref 0.0–2.0)
Bicarbonate: 20.4 mmol/L (ref 20.0–28.0)
O2 SAT: 92 %
Patient temperature: 38.2
TCO2: 22 mmol/L (ref 22–32)
pCO2 arterial: 39.6 mmHg (ref 32.0–48.0)
pH, Arterial: 7.325 — ABNORMAL LOW (ref 7.350–7.450)
pO2, Arterial: 72 mmHg — ABNORMAL LOW (ref 83.0–108.0)

## 2017-10-22 LAB — COOXEMETRY PANEL
Carboxyhemoglobin: 1.4 % (ref 0.5–1.5)
Methemoglobin: 1.2 % (ref 0.0–1.5)
O2 Saturation: 54.1 %
Total hemoglobin: 12.7 g/dL (ref 12.0–16.0)

## 2017-10-22 LAB — HEPARIN LEVEL (UNFRACTIONATED)
HEPARIN UNFRACTIONATED: 0.41 [IU]/mL (ref 0.30–0.70)
HEPARIN UNFRACTIONATED: 0.21 [IU]/mL — AB (ref 0.30–0.70)
Heparin Unfractionated: 0.46 IU/mL (ref 0.30–0.70)

## 2017-10-22 LAB — LACTATE DEHYDROGENASE: LDH: 420 U/L — ABNORMAL HIGH (ref 98–192)

## 2017-10-22 LAB — CALCIUM, IONIZED: Calcium, Ionized, Serum: 4.6 mg/dL (ref 4.5–5.6)

## 2017-10-22 LAB — MAGNESIUM: Magnesium: 2.3 mg/dL (ref 1.7–2.4)

## 2017-10-22 MED ORDER — POTASSIUM CHLORIDE 10 MEQ/50ML IV SOLN
10.0000 meq | INTRAVENOUS | Status: AC
  Administered 2017-10-22 (×4): 10 meq via INTRAVENOUS
  Filled 2017-10-22: qty 50

## 2017-10-22 MED ORDER — SODIUM CHLORIDE 0.9 % IV SOLN
300.0000 mg | Freq: Two times a day (BID) | INTRAVENOUS | Status: DC
  Administered 2017-10-22 – 2017-10-23 (×3): 300 mg via INTRAVENOUS
  Filled 2017-10-22 (×4): qty 300

## 2017-10-22 MED ORDER — FUROSEMIDE 10 MG/ML IJ SOLN
15.0000 mg/h | INTRAVENOUS | Status: DC
  Administered 2017-10-22: 8 mg/h via INTRAVENOUS
  Administered 2017-10-23 – 2017-10-25 (×4): 12 mg/h via INTRAVENOUS
  Administered 2017-10-26: 15 mg/h via INTRAVENOUS
  Administered 2017-10-26: 12 mg/h via INTRAVENOUS
  Administered 2017-10-27: 15 mg/h via INTRAVENOUS
  Filled 2017-10-22 (×3): qty 25
  Filled 2017-10-22: qty 20
  Filled 2017-10-22 (×6): qty 25
  Filled 2017-10-22: qty 20

## 2017-10-22 MED ORDER — FUROSEMIDE 10 MG/ML IJ SOLN
40.0000 mg | Freq: Once | INTRAMUSCULAR | Status: AC
  Administered 2017-10-22: 40 mg via INTRAVENOUS
  Filled 2017-10-22: qty 4

## 2017-10-22 NOTE — Progress Notes (Signed)
ANTICOAGULATION CONSULT NOTE - Follow Up Consult  Pharmacy Consult:  Heparin Indication: atrial fibrillation and LA thrombus  Allergies  Allergen Reactions  . Fentanyl And Related Other (See Comments)    Patients eyes roll in the back of his head and patient has seizure like activity    Patient Measurements: Height: 5\' 10"  (177.8 cm) Weight: 231 lb (104.8 kg)(weight obtained with 1 pillow on bed ) IBW/kg (Calculated) : 73  Heparin dosing weight = 84 kg  Vital Signs: Temp: 100.9 F (38.3 C) (02/12 0806) Temp Source: Bladder (02/12 0800) BP: 97/79 (02/12 1000) Pulse Rate: 121 (02/12 1000)  Labs: Recent Labs    10/20/17 0230  10/21/17 0354 10/21/17 1533 10/21/17 1556 10/21/17 1636 10/22/17 0044 10/22/17 0342 10/22/17 0930  HGB 8.4*   < > 8.9*  --  19.0* 8.2*  --  8.8*  --   HCT 26.8*   < > 28.1*  --  56.0* 24.0*  --  27.8*  --   PLT 265  --  345  --   --   --   --  327  --   HEPARINUNFRC 0.32  --  0.39 0.54  --   --  0.46  --  0.41  CREATININE 2.91*   < > 3.05*  --  3.00* 3.10*  --  3.18*  --    < > = values in this interval not displayed.    Estimated Creatinine Clearance: 28.1 mL/min (A) (by C-G formula based on SCr of 3.18 mg/dL (H)).    Assessment: 3 YOM known to pharmacy for heparin management with impella. Impella removed 10/16/17 due to stoppage from small vegetation that got sucked into the catheter. Heparin resumed ~ 4 hours after removal for afib and LA thrombus.   Heparin levels have been supra-therapeutic since yesterday - now trending downward. Hgb is stable at 8.8, plt count is stable. Still having bloody chest tube drainage (410 mL/24 hr). No other signs/symptoms of bleeding. No infusion issues per nursing.  Goal of Therapy:  Heparin level 0.3 units/ml Monitor platelets by anticoagulation protocol: Yes   Plan:  Decrease heparin gtt to 1550 units/hr Check 8 hr heparin level Daily heparin level and CBC  Girard Cooter, PharmD Clinical  Pharmacist  Pager: [phone] Clinical Phone for 10/22/2017 until 3:30pm: [phone] If after 3:30pm, please call main pharmacy at [phone] 10/22/2017, 10:48 AM

## 2017-10-22 NOTE — Progress Notes (Signed)
Subjective: No new complaints. Family is at bedside. State that they are weaning him off the ventilator today. Concerned about the patient's onychomycosis causing his recurrent fevers. Discussed continuing current antibiotics regimen with no changes from an ID standpoint. Not sure the family completely understands that patient's current situation.   Antibiotics:  Anti-infectives (From admission, onward)   Start     Dose/Rate Route Frequency Ordered Stop   10/15/17 1200  ceftaroline (TEFLARO) 400 mg in sodium chloride 0.9 % 250 mL IVPB     400 mg 250 mL/hr over 60 Minutes Intravenous Every 12 hours 10/15/17 1052     10/15/17 1100  metroNIDAZOLE (FLAGYL) IVPB 500 mg     500 mg 100 mL/hr over 60 Minutes Intravenous Every 8 hours 10/15/17 1052     10/13/17 1200  anidulafungin (ERAXIS) 100 mg in sodium chloride 0.9 % 100 mL IVPB  Status:  Discontinued     100 mg 78 mL/hr over 100 Minutes Intravenous Every 24 hours 10/12/17 1123 10/14/17 1238   10/12/17 1200  anidulafungin (ERAXIS) 200 mg in sodium chloride 0.9 % 200 mL IVPB     200 mg 78 mL/hr over 200 Minutes Intravenous  Once 10/12/17 1123 10/12/17 1702   10/04/2017 2000  vancomycin (VANCOCIN) IVPB 1000 mg/200 mL premix  Status:  Discontinued     1,000 mg 200 mL/hr over 60 Minutes Intravenous Every 24 hours 09/29/2017 1754 10/14/17 0951   09/18/2017 1800  meropenem (MERREM) 1 g in sodium chloride 0.9 % 100 mL IVPB  Status:  Discontinued     1 g 200 mL/hr over 30 Minutes Intravenous Every 12 hours 09/23/2017 1720 10/15/17 1052   09/11/2017 0912  vancomycin (VANCOCIN) 1,000 mg in sodium chloride 0.9 % 1,000 mL irrigation  Status:  Discontinued       As needed 10/10/2017 0912 10/01/2017 1115   09/13/2017 0700  fluconazole (DIFLUCAN) IVPB 400 mg  Status:  Discontinued     400 mg 100 mL/hr over 120 Minutes Intravenous To Surgery 09/11/2017 0650 10/01/2017 1115   09/25/2017 0700  rifampin (RIFADIN) 600 mg in sodium chloride 0.9 % 100 mL IVPB  Status:   Discontinued     600 mg 200 mL/hr over 30 Minutes Intravenous To Surgery 09/21/2017 0650 09/19/2017 1115   09/10/2017 0700  vancomycin (VANCOCIN) powder 1,000 mg  Status:  Discontinued     1,000 mg Other To Surgery 09/19/2017 0650 10/01/2017 1115   09/10/2017 0400  vancomycin (VANCOCIN) 1,250 mg in sodium chloride 0.9 % 250 mL IVPB     1,250 mg 166.7 mL/hr over 90 Minutes Intravenous To Surgery 09/12/2017 1910 09/18/2017 1222   10/01/2017 0400  cefUROXime (ZINACEF) 1.5 g in dextrose 5 % 50 mL IVPB     1.5 g 100 mL/hr over 30 Minutes Intravenous To Surgery 09/19/2017 1910 09/16/2017 1202   09/25/2017 0400  cefUROXime (ZINACEF) 750 mg in dextrose 5 % 50 mL IVPB  Status:  Discontinued     750 mg 100 mL/hr over 30 Minutes Intravenous To Surgery 10/03/2017 1910 09/20/2017 1115   10/01/17 0600  cefTRIAXone (ROCEPHIN) 2 g in dextrose 5 % 50 mL IVPB  Status:  Discontinued     2 g 100 mL/hr over 30 Minutes Intravenous Every 24 hours 10/01/17 0541 09/28/2017 1705   09/19/2017 1315  doxycycline (VIBRA-TABS) tablet 100 mg  Status:  Discontinued     100 mg Oral Every 12 hours 09/11/2017 1301 10/02/17 1346   10/05/2017 1315  cefTRIAXone (ROCEPHIN) 1 g in dextrose 5 % 50 mL IVPB  Status:  Discontinued     1 g 100 mL/hr over 30 Minutes Intravenous Every 24 hours 10/10/2017 1301 10/01/17 0541     Medications: Scheduled Meds: . chlorhexidine gluconate (MEDLINE KIT)  15 mL Mouth Rinse BID  . Chlorhexidine Gluconate Cloth  6 each Topical Daily  . feeding supplement (PRO-STAT SUGAR FREE 64)  30 mL Per Tube TID  . Gerhardt's butt cream   Topical BID  . insulin aspart  2-6 Units Subcutaneous Q4H  . insulin glargine  12 Units Subcutaneous BID  . mouth rinse  15 mL Mouth Rinse 10 times per day  . sennosides  5 mL Oral Daily  . sodium chloride flush  3 mL Intravenous Q12H   Continuous Infusions: . sodium chloride 20 mL/hr at 10/15/17 0608  . amiodarone 30 mg/hr (10/22/17 0800)  . ceFTAROline (TEFLARO) IV Stopped (10/22/17 0145)  .  dexmedetomidine (PRECEDEX) IV infusion for high rates 0.3 mcg/kg/hr (10/22/17 0815)  . famotidine (PEPCID) IV Stopped (10/21/17 0943)  . feeding supplement (VITAL 1.5 CAL) 1,000 mL (10/22/17 0800)  . furosemide (LASIX) infusion    . heparin 1,700 Units/hr (10/22/17 0800)  . metronidazole Stopped (10/22/17 0537)  . milrinone 0.375 mcg/kg/min (10/22/17 0800)  . norepinephrine (LEVOPHED) Adult infusion Stopped (10/21/17 1400)  . potassium chloride 10 mEq (10/22/17 0809)   PRN Meds:.sodium chloride, acetaminophen (TYLENOL) oral liquid 160 mg/5 mL, albuterol, magic mouthwash, midazolam, morphine injection, ondansetron (ZOFRAN) IV, sodium chloride flush, sodium chloride flush  Objective: Weight change: 14 lb (6.35 kg)  Intake/Output Summary (Last 24 hours) at 10/22/2017 0847 Last data filed at 10/22/2017 0800 Gross per 24 hour  Intake 3976.8 ml  Output 2685 ml  Net 1291.8 ml   Blood pressure (!) 81/39, pulse (!) 132, temperature (!) 100.9 F (38.3 C), resp. rate (!) 35, height _0  (1.778 m), weight 231 lb (104.8 kg), SpO2 98 %. Temp:  [99.9 F (37.7 C)-101.3 F (38.5 C)] 100.9 F (38.3 C) (02/12 0806) Pulse Rate:  [25-141] 132 (02/12 0813) Resp:  [8-36] 35 (02/12 0813) BP: (70-141)/(39-122) 81/39 (02/12 0806) SpO2:  [78 %-98 %] 98 % (02/12 0813) Arterial Line BP: (88-120)/(50-74) 108/65 (02/12 0800) FiO2 (%):  [30 %-40 %] 30 % (02/12 0813) Weight:  [231 lb (104.8 kg)] 231 lb (104.8 kg) (02/12 0256)  Physical Exam: General: Alert, intubated HEENT: Anicteric sclera, pupils reactive to light and accommodation, EOMI CVS: Distant heart sounds, tachycardic  Chest: Diffuse rhonchi/crackles  Abdomen: Soft nontender, nondistended, normal bowel sounds Extremities: Compression wraps of the bilateral LE Skin: No rashes Lymph: No new lymphadenopathy Neuro: Nonfocal   CBC: _1 (wbc3,Hgb:3,Hct:3,Plt:3,INR:3APTT:3)@  BMET Recent Labs    10/21/17 0354  10/21/17 1636  10/22/17 0342  NA 145   < > 146* 147*  K 4.0   < > 3.7 3.8  CL 113*   < > 112* 116*  CO2 21*  --   --  20*  GLUCOSE 174*   < > 116* 106*  BUN 94*   < > 81* 98*  CREATININE 3.05*   < > 3.10* 3.18*  CALCIUM 7.5*  --   --  7.5*   < > = values in this interval not displayed.   Liver Panel  No results for input(s): PROT, ALBUMIN, AST, ALT, ALKPHOS, BILITOT, BILIDIR, IBILI in the last 72 hours.  Sedimentation Rate No results for input(s): ESRSEDRATE in the last 72 hours. C-Reactive Protein No results for  input(s): CRP in the last 72 hours.  Micro Results: Recent Results (from the past 720 hour(s))  Culture, blood (Routine X 2) w Reflex to ID Panel     Status: Abnormal   Collection Time: 09/12/2017  1:08 PM  Result Value Ref Range Status   Specimen Description BLOOD RIGHT HAND  Final   Special Requests   Final    BOTTLES DRAWN AEROBIC AND ANAEROBIC Blood Culture adequate volume   Culture  Setup Time   Final    GRAM POSITIVE COCCI AEROBIC BOTTLE ONLY CRITICAL RESULT CALLED TO, READ BACK BY AND VERIFIED WITH: J LEDFORD PHARMD 0532 10/01/17 A BROWNING    Culture STREPTOCOCCUS PNEUMONIAE (A)  Final   Report Status 10/03/2017 FINAL  Final   Organism ID, Bacteria STREPTOCOCCUS PNEUMONIAE  Final      Susceptibility   Streptococcus pneumoniae - MIC*    ERYTHROMYCIN <=0.12 SENSITIVE Sensitive     LEVOFLOXACIN 0.5 SENSITIVE Sensitive     PENICILLIN (non-meningitis) 0.25 SENSITIVE Sensitive     CEFTRIAXONE (non-meningitis) <=0.12 SENSITIVE Sensitive     * STREPTOCOCCUS PNEUMONIAE  Blood Culture ID Panel (Reflexed)     Status: Abnormal   Collection Time: 09/29/2017  1:08 PM  Result Value Ref Range Status   Enterococcus species NOT DETECTED NOT DETECTED Final   Listeria monocytogenes NOT DETECTED NOT DETECTED Final   Staphylococcus species NOT DETECTED NOT DETECTED Final   Staphylococcus aureus NOT DETECTED NOT DETECTED Final   Streptococcus species DETECTED (A) NOT DETECTED Final     Comment: CRITICAL RESULT CALLED TO, READ BACK BY AND VERIFIED WITH: J LEDFORD PHARMD 0532 10/01/17 A BROWNING    Streptococcus agalactiae NOT DETECTED NOT DETECTED Final   Streptococcus pneumoniae DETECTED (A) NOT DETECTED Final    Comment: CRITICAL RESULT CALLED TO, READ BACK BY AND VERIFIED WITH: J LEDFORD PHARMD 0532 10/01/17 A BROWNING    Streptococcus pyogenes NOT DETECTED NOT DETECTED Final   Acinetobacter baumannii NOT DETECTED NOT DETECTED Final   Enterobacteriaceae species NOT DETECTED NOT DETECTED Final   Enterobacter cloacae complex NOT DETECTED NOT DETECTED Final   Escherichia coli NOT DETECTED NOT DETECTED Final   Klebsiella oxytoca NOT DETECTED NOT DETECTED Final   Klebsiella pneumoniae NOT DETECTED NOT DETECTED Final   Proteus species NOT DETECTED NOT DETECTED Final   Serratia marcescens NOT DETECTED NOT DETECTED Final   Haemophilus influenzae NOT DETECTED NOT DETECTED Final   Neisseria meningitidis NOT DETECTED NOT DETECTED Final   Pseudomonas aeruginosa NOT DETECTED NOT DETECTED Final   Candida albicans NOT DETECTED NOT DETECTED Final   Candida glabrata NOT DETECTED NOT DETECTED Final   Candida krusei NOT DETECTED NOT DETECTED Final   Candida parapsilosis NOT DETECTED NOT DETECTED Final   Candida tropicalis NOT DETECTED NOT DETECTED Final  Culture, blood (Routine X 2) w Reflex to ID Panel     Status: None   Collection Time: 09/14/2017  1:11 PM  Result Value Ref Range Status   Specimen Description BLOOD LEFT FOREARM  Final   Special Requests IN PEDIATRIC BOTTLE Blood Culture adequate volume  Final   Culture NO GROWTH 5 DAYS  Final   Report Status 10/05/2017 FINAL  Final  Respiratory Panel by PCR     Status: Abnormal   Collection Time: 09/25/2017  1:51 PM  Result Value Ref Range Status   Adenovirus NOT DETECTED NOT DETECTED Final   Coronavirus 229E NOT DETECTED NOT DETECTED Final   Coronavirus HKU1 NOT DETECTED NOT DETECTED Final  Coronavirus NL63 NOT DETECTED NOT  DETECTED Final   Coronavirus OC43 NOT DETECTED NOT DETECTED Final   Metapneumovirus NOT DETECTED NOT DETECTED Final   Rhinovirus / Enterovirus NOT DETECTED NOT DETECTED Final   Influenza A NOT DETECTED NOT DETECTED Final   Influenza B NOT DETECTED NOT DETECTED Final   Parainfluenza Virus 1 NOT DETECTED NOT DETECTED Final   Parainfluenza Virus 2 NOT DETECTED NOT DETECTED Final   Parainfluenza Virus 3 DETECTED (A) NOT DETECTED Final   Parainfluenza Virus 4 NOT DETECTED NOT DETECTED Final   Respiratory Syncytial Virus NOT DETECTED NOT DETECTED Final   Bordetella pertussis NOT DETECTED NOT DETECTED Final   Chlamydophila pneumoniae NOT DETECTED NOT DETECTED Final   Mycoplasma pneumoniae NOT DETECTED NOT DETECTED Final  Culture, blood (routine x 2)     Status: None   Collection Time: 09/19/2017  4:45 PM  Result Value Ref Range Status   Specimen Description BLOOD LEFT HAND  Final   Special Requests IN PEDIATRIC BOTTLE Blood Culture adequate volume  Final   Culture NO GROWTH 5 DAYS  Final   Report Status 10/05/2017 FINAL  Final  MRSA PCR Screening     Status: None   Collection Time: 09/25/2017  4:49 PM  Result Value Ref Range Status   MRSA by PCR NEGATIVE NEGATIVE Final    Comment:        The GeneXpert MRSA Assay (FDA approved for NASAL specimens only), is one component of a comprehensive MRSA colonization surveillance program. It is not intended to diagnose MRSA infection nor to guide or monitor treatment for MRSA infections.   Culture, blood (routine x 2)     Status: None   Collection Time: 09/14/2017  5:06 PM  Result Value Ref Range Status   Specimen Description BLOOD RIGHT HAND  Final   Special Requests IN PEDIATRIC BOTTLE Blood Culture adequate volume  Final   Culture NO GROWTH 5 DAYS  Final   Report Status 10/05/2017 FINAL  Final  Culture, blood (Routine X 2) w Reflex to ID Panel     Status: None   Collection Time: 09/27/2017  4:18 PM  Result Value Ref Range Status   Specimen  Description BLOOD RIGHT ANTECUBITAL  Final   Special Requests IN PEDIATRIC BOTTLE Blood Culture adequate volume  Final   Culture   Final    NO GROWTH 5 DAYS Performed at [facility], [place] [address] [place], [place] [address]    Report Status 10/12/2017 FINAL  Final  Culture, blood (Routine X 2) w Reflex to ID Panel     Status: None   Collection Time: 09/10/2017  4:20 PM  Result Value Ref Range Status   Specimen Description BLOOD RIGHT ANTECUBITAL  Final   Special Requests IN PEDIATRIC BOTTLE Blood Culture adequate volume  Final   Culture   Final    NO GROWTH 5 DAYS Performed at [facility], [place] [address] [place], [place] [address]    Report Status 10/12/2017 FINAL  Final  Surgical pcr screen     Status: Abnormal   Collection Time: 09/23/2017  8:37 PM  Result Value Ref Range Status   MRSA, PCR RESULT CALLED TO, READ BACK BY AND VERIFIED WITH: (A) NEGATIVE Final    Comment:  M AMO RN 09/28/2017 0504 JDW   Staphylococcus aureus INVALID RESULTS, SPECIMEN SENT FOR CULTURE (A) NEGATIVE Final    Comment: Results Called to:  M [facility] 09/29/2017 0504 JDW (NOTE) The Xpert SA Assay (  FDA approved for NASAL specimens in patients 61 years of age and older), is one component of a comprehensive surveillance program. It is not intended to diagnose infection nor to guide or monitor treatment.   MRSA culture     Status: None   Collection Time: 10/02/2017  8:37 PM  Result Value Ref Range Status   Specimen Description NASOPHARYNGEAL  Final   Special Requests NONE  Final   Culture NO MRSA DETECTED  Final   Report Status 10/10/2017 FINAL  Final  Culture, blood (routine x 2)     Status: None   Collection Time: 09/25/2017  5:52 PM  Result Value Ref Range Status   Specimen Description BLOOD RIGHT ANTECUBITAL  Final   Special Requests IN PEDIATRIC BOTTLE Blood Culture adequate volume  Final   Culture   Final    NO GROWTH 5 DAYS Performed at [facility], [place] [address] [place], [place]  [address]    Report Status 10/14/2017 FINAL  Final  Culture, blood (routine x 2)     Status: None   Collection Time: 09/23/2017  6:00 PM  Result Value Ref Range Status   Specimen Description BLOOD RIGHT HAND  Final   Special Requests IN PEDIATRIC BOTTLE Blood Culture adequate volume  Final   Culture   Final    NO GROWTH 5 DAYS Performed at [facility], [place] [address] [place], [place] [address]    Report Status 10/14/2017 FINAL  Final  Culture, respiratory (NON-Expectorated)     Status: None   Collection Time: 10/10/17  7:55 AM  Result Value Ref Range Status   Specimen Description TRACHEAL ASPIRATE  Final   Special Requests NONE  Final   Gram Stain   Final    RARE WBC PRESENT, PREDOMINANTLY MONONUCLEAR NO ORGANISMS SEEN Performed at [facility], [place] [address] [place], [place] [address]    Culture RARE CANDIDA ALBICANS  Final   Report Status 10/12/2017 FINAL  Final  Culture, bal-quantitative     Status: Abnormal   Collection Time: 10/11/17 11:11 AM  Result Value Ref Range Status   Specimen Description BRONCHIAL ALVEOLAR LAVAGE  Final   Special Requests NONE  Final   Gram Stain   Final    MODERATE WBC PRESENT,BOTH PMN AND MONONUCLEAR NO SQUAMOUS EPITHELIAL CELLS SEEN RARE BUDDING YEAST SEEN Performed at [facility], [place] [address] [place], [place] [address]    Culture 40,000 COLONIES/mL CANDIDA ALBICANS (A)  Final   Report Status 10/13/2017 FINAL  Final  Culture, respiratory (NON-Expectorated)     Status: None   Collection Time: 10/15/17  8:12 AM  Result Value Ref Range Status   Specimen Description TRACHEAL ASPIRATE  Final   Special Requests Normal  Final   Gram Stain   Final    FEW WBC PRESENT, PREDOMINANTLY PMN RARE GRAM NEGATIVE RODS Performed at [facility], [place] [address], [place], [place] [address]    Culture FEW CANDIDA ALBICANS  Final   Report Status 10/18/2017 FINAL  Final  Culture, blood (Routine X 2) w Reflex to ID Panel     Status:  None   Collection Time: 10/15/17  8:52 AM  Result Value Ref Range Status   Specimen Description BLOOD RIGHT HAND  Final   Special Requests IN PEDIATRIC BOTTLE Blood Culture adequate volume  Final   Culture   Final    NO GROWTH 5 DAYS Performed at [facility], [place]  [address], [place], [place] [address]    Report Status 10/20/2017 FINAL  Final  Culture, blood (Routine X 2) w Reflex to ID Panel     Status: None   Collection Time: 10/15/17 10:20 AM  Result Value Ref Range Status   Specimen Description BLOOD LEFT HAND  Final   Special Requests IN PEDIATRIC BOTTLE Blood Culture adequate volume  Final   Culture   Final    NO GROWTH 5 DAYS Performed at [facility], [place] [address] [place], [place] [address]    Report Status 10/20/2017 FINAL  Final  Culture, Urine     Status: None   Collection Time: 10/15/17 10:29 AM  Result Value Ref Range Status   Specimen Description URINE, CATHETERIZED  Final   Special Requests Normal  Final   Culture   Final    NO GROWTH Performed at [place] [address] [place], [place] [address]    Report Status 10/16/2017 FINAL  Final  Culture, respiratory (NON-Expectorated)     Status: None (Preliminary result)   Collection Time: 10/20/17  2:45 PM  Result Value Ref Range Status   Specimen Description TRACHEAL ASPIRATE  Final   Special Requests NONE  Final   Gram Stain   Final    MODERATE WBC PRESENT,BOTH PMN AND MONONUCLEAR RARE SQUAMOUS EPITHELIAL CELLS PRESENT NO ORGANISMS SEEN    Culture   Final    NO GROWTH < 24 HOURS Performed at [facility], [place] [address] [place], [place] [address]    Report Status PENDING  Incomplete  Culture, Urine     Status: None   Collection Time: 10/20/17  2:56 PM  Result Value Ref Range Status   Specimen Description URINE, CATHETERIZED  Final   Special Requests Normal  Final   Culture   Final    NO GROWTH Performed at [facility], [address] [place], [place] [address]     Report Status 10/21/2017 FINAL  Final  C difficile quick scan w PCR reflex     Status: None   Collection Time: 10/20/17  2:56 PM  Result Value Ref Range Status   C Diff antigen NEGATIVE NEGATIVE Final   C Diff toxin NEGATIVE NEGATIVE Final   C Diff interpretation No C. difficile detected.  Final    Comment: Performed at [facility], [place] [address] [place], [place] [address]  Culture, blood (routine x 2)     Status: None (Preliminary result)   Collection Time: 10/20/17  3:18 PM  Result Value Ref Range Status   Specimen Description BLOOD RIGHT HAND  Final   Special Requests IN PEDIATRIC BOTTLE Blood Culture adequate volume  Final   Culture   Final    NO GROWTH < 24 HOURS Performed at [facility], [place] [address] [place], [place] [address]    Report Status PENDING  Incomplete  Culture, blood (routine x 2)     Status: None (Preliminary result)   Collection Time: 10/20/17  3:24 PM  Result Value Ref Range Status   Specimen Description BLOOD LEFT HAND  Final   Special Requests IN PEDIATRIC BOTTLE Blood Culture adequate volume  Final   Culture   Final    NO GROWTH < 24 HOURS Performed at [facility], [place] [address] [place],  [address]    Report Status PENDING  Incomplete   Studies/Results: Dg Chest Port 1 View  Result Date: 10/21/2017 CLINICAL DATA:  Respiratory failure, status post cardioversion, placement of Impella left ventricular device EXAM: PORTABLE CHEST 1 VIEW COMPARISON:  10/20/2017 FINDINGS: Endotracheal tube terminates 2.5 cm above the carina. Cardiomegaly with mild interstitial edema. Moderate right pleural effusion with associated right lower lobe opacity, likely atelectasis. Small left pleural effusion. Right apical chest tube.  No pneumothorax is seen. Left subclavian venous catheter terminates in the mid SVC. Enteric tube courses into the stomach. Surgical clips overlying the right lateral chest wall/axilla with associated overlying skin  staples. Interval removal of right IJ venous sheath. IMPRESSION: Endotracheal tube terminates 2.5 cm above the carina. Cardiomegaly with mild interstitial edema. Moderate right and small left pleural effusions. Right lower lobe opacity, likely atelectasis. Right apical chest tube.  No pneumothorax is seen. Additional support apparatus as above. Electronically Signed   By: Julian Hy M.D.   On: 10/21/2017 07:37   Dg Chest Port 1 View  Result Date: 10/20/2017 CLINICAL DATA:  Right line placement EXAM: PORTABLE CHEST 1 VIEW COMPARISON:  10/20/2017 FINDINGS: Endotracheal tube, right chest tube and left subclavian central line remain in place, unchanged. Interval removal of the Swan-Ganz catheter. Right internal jugular vascular sheath in place with the tip likely in the right innominate vein. Cardiomegaly with bilateral airspace opacities and layering effusions, similar prior study. IMPRESSION: Right vascular sheath tip in the right innominate vein. No pneumothorax. Continued bilateral airspace opacities and layering effusions. Electronically Signed   By: Rolm Baptise M.D.   On: 10/20/2017 12:22   Dg Chest Port 1 View  Result Date: 10/20/2017 CLINICAL DATA:  Replacement of endotracheal tube after patient pulled out EXAM: PORTABLE CHEST 1 VIEW COMPARISON:  10/20/2017 and prior radiographs FINDINGS: Cardiomegaly again noted. An endotracheal tube with tip 2 cm above the carina, small bore feeding tube entering the stomach with tip off the field of view, right IJ Swan-Ganz catheter with tip overlying the right main pulmonary artery, left subclavian central venous catheter with tip overlying the upper SVC and right thoracostomy tube again noted. Pulmonary vascular congestion, pleural effusions, and bilateral lower lung consolidation/atelectasis are unchanged. A miniscule right apical pneumothorax is noted. IMPRESSION: 1. Little significant change in appearance of the chest except for miniscule right apical  pneumothorax. Electronically Signed   By: Margarette Canada M.D.   On: 10/20/2017 09:35   Assessment/Plan:  INTERVAL HISTORY:  - Continues to be febrile with a T max of 101.8F - Continuing to work with family on [place] placement  - Cardiology to discuss chest tube repositioning with pulmonology  - Still on Milrinone but off norepinephrine  - Restarting Lasix today  - Approximately 410 mL from chest tube over past 24 hours, CT surgery doing water seal   ASSESSMENT:  Bryan Harmon a 67 y.o.maleHFrEF and Atriat fibrillation who presented on 1/21in A-fib with RVR and low output heart failure. He has subsequently has had a complicated hospitalizationand persistent fevers since 1/30. Blood cultures from 2/10 with no growth to date. Likely source of his recurrent fevers is necrotic PNA and loculated right pleural effusion.   - Continue Metronidazole and Ceftraoline  - Could also consider lytic therapy for the patient's loculated effusion    LOS: 22 days   [facility] 10/22/2017, 8:47 AM

## 2017-10-22 NOTE — Progress Notes (Addendum)
PULMONARY / CRITICAL CARE MEDICINE   Name: Bryan Harmon MRN: [id number] DOB: 06-02-1951    ADMISSION DATE:  10/04/2017  REFERRING MD:  Nils Pyle  CHIEF COMPLAINT:  Fatigue  HISTORY OF PRESENT ILLNESS:   67 y/o male with non-ischemic cardiomyopathy and afib admitted with decompensated heart failure.  Required Impella, vent.    Lines/ Tubes 1/30 L IJ CVL > 1/31 1/30 R IJ swan > 1/30 R sub clav impella > 10/16/2017 1/30 L radial arterial line > 10/15/2017 1/30 ETT >  1/31 L [place] TLC >  10/15/2017 right femoral arterial line placed>>  Cultures: 1/21 Blood >> Strep Pneumoniae >> pan sens 1/21 Resp Viral Panel >> + Parainfluenza Virus 3 1/28 blood > ng 1/30 blood > ng 1/31 resp culture > ng 2/1 BAL > candida 10/15/2017 blood cultures x2>> 10/15/2017 tracheal aspirate>> rare gram-negative rods few Candida albicans 10/15/2017 urine culture>> negative  ABX: Infectious disease consult 10/14/2017 Rocephin 1/21 >> 1/29 Doxy 1/21 >> 1/23 Vanc 1/30 >> 10/14/2017 Mero 1/30 >> 10/15/2017 Eraxis 2/2 >> off Teflaro 10/15/2017>> Flagyl 10/15/2017>>  Studies: 10/01/2017>> Echo EF 15-20%, [place] ? PAP muscle vs. Thrombus, LV severely dilated, moderate concentric hypertrophy, systolic function normal, unable to evaluate LV diastolic function due to atrial fibrillation. + moderate spontaneous echo contrast, indicative of stasis Impression 1/23 Echo > Severely dilated LV with severe LV dysfunction globally with EF 15-20%. Severely dialted RV with severe RV dysfunction. Moderate to severe MR with ERO 0.33cm2 and MR volume 68m. Moderate tosevere TR with moderate pulmonary HTN. Moderately thickened and calcified AV leaflets with mild MR, mildly dilated aortic root, massive biatrial enlargement. Cannot rule out LV thrombus. Thereis significant spontaneous echo contrast in LV c/w sluggish blood flow. The right ventricular systolic pressure was increasedconsistent with moderate pulmonary hypertension. 1/29 CT  chest > dense bilateral lower lobe consolidation R>L 1/30 TEE >> LVEF 20-25%, impella in place, poor overall contractility but LV improved  SUBJECTIVE:   Unresponsive this AM, increased WOB  VITAL SIGNS: BP (!) 81/39   Pulse (!) 132   Temp (!) 100.9 F (38.3 C)   Resp (!) 35   Ht _0  (1.778 m)   Wt 104.8 kg (231 lb) Comment: weight obtained with 1 pillow on bed   SpO2 98%   BMI 33.15 kg/m   HEMODYNAMICS: CVP:  [10 mmHg-18 mmHg] 15 mmHg  VENTILATOR SETTINGS: Vent Mode: PSV;CPAP FiO2 (%):  [30 %-40 %] 30 % Set Rate:  [10 bmp] 10 bmp PEEP:  [5 cmH20] 5 cmH20 Pressure Support:  [10 cmH20-15 cmH20] 15 cmH20 Plateau Pressure:  [15 cmH20-17 cmH20] 15 cmH20  INTAKE / OUTPUT: I/O last 3 completed shifts: In: 6503.7 [I.V.:2463.7; NG/GT:2740; IV [id number]:0923]Out: 43007[Urine:3085; Stool:400; Chest Tube:670]  PHYSICAL EXAMINATION: General: Frail ill-appearing male, unresponsive with very high WOB HEENT: Endotracheal tube connected to ventilator, feeding tube in place, [place]/AT, PERRL Neuro: Unresponsive CV: Regular with frequent PVCs, on pressors PULM: Increased WOB with crackles throughout all lung fileds GI: Soft, non-tender, bsx4 active  Extremities: warm/dry, decreased edema  Skin: Skin breakdown noted at multiple pressure points.  LABS:  BMET Recent Labs  Lab 10/20/17 0230  10/21/17 0354 10/21/17 1556 10/21/17 1636 10/22/17 0342  NA 144   < > 145 147* 146* 147*  K 3.8   < > 4.0 3.7 3.7 3.8  CL 113*   < > 113* 115* 112* 116*  CO2 21*  --  21*  --   --  20*  BUN  93*   < > 94* 73* 81* 98*  CREATININE 2.91*   < > 3.05* 3.00* 3.10* 3.18*  GLUCOSE 139*   < > 174* 115* 116* 106*   < > = values in this interval not displayed.   Electrolytes Recent Labs  Lab 10/19/17 0304 10/20/17 0230 10/21/17 0354 10/22/17 0342  CALCIUM 7.3* 7.3* 7.5* 7.5*  MG 2.4 2.3 2.2 2.3  PHOS 3.7 4.5 5.2*  --    CBC Recent Labs  Lab 10/20/17 0230  10/21/17 0354 10/21/17 1556  10/21/17 1636 10/22/17 0342  WBC 9.2  --  9.1  --   --  8.4  HGB 8.4*   < > 8.9* 19.0* 8.2* 8.8*  HCT 26.8*   < > 28.1* 56.0* 24.0* 27.8*  PLT 265  --  345  --   --  327   < > = values in this interval not displayed.    Coag's Recent Labs  Lab 10/16/17 0404 10/17/17 0454 10/18/17 0400  APTT 82* 72* 98*    Sepsis Markers No results for input(s): LATICACIDVEN, PROCALCITON, O2SATVEN in the last 168 hours.  ABG Recent Labs  Lab 10/20/17 0537 10/21/17 1645 10/22/17 0424  PHART 7.388 7.444 7.325*  PCO2ART 33.3 29.4* 39.6  PO2ART 89.0 83.0 72.0*    Liver Enzymes Recent Labs  Lab 10/15/17 1029  AST 68*  ALT 44  ALKPHOS 75  BILITOT 1.8*  ALBUMIN 1.6*    Cardiac Enzymes No results for input(s): TROPONINI, PROBNP in the last 168 hours.  Glucose Recent Labs  Lab 10/21/17 0051 10/21/17 0407 10/21/17 0816 10/21/17 1155 10/21/17 1954 10/22/17 0337  GLUCAP 152* 159* 119* 140* 107* 90    Imaging No results found. DISCUSSION: 67 y/o male with ischemic cardiomyopathy admitted with decompensated systolic heart failure in the setting of strep pneumo pneumonia and bacteremia s/p impella.  He has acute on chronic kidney failure, cardiogenic shock.  10/15/2017 with fever 105.6, pancultured 01/2018, ID consult 10/14/2017. 's overall cardiac function has declined despite all interventions.  Family wants to continue with maximum interventions.  10/18/2016 CODE STATUS changed to a limited with no chest compressions but all other interventions will be continued.  Despite his poor prognosis he is continued to improve therefore possibility of tracheostomy may be undertaken within 72 hours. 10/19/2017 no significant change in status.  I do note there is some having been placed on pressure regulated volume control mode of ventilation which was returned to written orders for pressure control ventilation on 10/19/2017. His hemoglobin is drifting down he may be ready for follow-up  transfusion 10/20/2017 continues to slowly improve.  Massive anasarca has improved despite being a positive I&O.  His Levophed is weaning.  Plan for tracheostomy in the next 24-72 hours  ASSESSMENT / PLAN:  PULMONARY A: Acute respiratory failure with hypoxemia Pneumococcal CAP - RLL  Acute pulmonary edema Bilateral pleural effusions , R >> L P:   PS as able but no extubation given mental status and renal function Dr. Aundra Dubin and myself to meet with the family at 1:15 to determine plan of care Titrate O2 for sat of 88-92%  CARDIOVASCULAR A:  Acute decompensated cardiomyopathy > severe biventricular failure, LVEF ~ 5% S/p Impella 1/30 with removal on 10/16/2017 after failure most likely from vegetation been ingested by the Impella intake. Cardiogenic Shock.  Multiple vasopressor dependent Medical Non-Compliance - stopped taking all meds prior to admit for CHF, ? Long term plan 10/11/2017 right chest tube P:  Volume management,  inotrope management as per advanced heart failure team plans, TCTS plans.  Note CVP is 8, milrinone is being weaned on 10/18/2017 Hold lasix for deteriorating renal function Ct levophed which is being weaned also./ milrinone gtt Amiodarone drip, if stabilizes consider cardioversion in the future. Prognosis is very poor but family is not quite understanding how ill the patient is and that his chances of survival are very poor CT to water seal  RENAL Lab Results  Component Value Date   CREATININE 3.18 (H) 10/22/2017   CREATININE 3.10 (H) 10/21/2017   CREATININE 3.00 (H) 10/21/2017   CREATININE 1.11 03/25/2012   Recent Labs  Lab 10/21/17 1556 10/21/17 1636 10/22/17 0342  K 3.7 3.7 3.8   Recent Labs  Lab 10/21/17 1556 10/21/17 1636 10/22/17 0342  NA 147* 146* 147*   Intake/Output Summary (Last 24 hours) at 10/22/2017 6213 Last data filed at 10/22/2017 0800 Gross per 24 hour  Intake 3839.02 ml  Output 2625 ml  Net 1214.02 ml   A:   Stable renal  failure  Hypokalemia/hypervolemic P:   Continue to follow urine output, BMP Replace electrolytes as indicated Avoid nephrotoxic agents, ensure adequate renal perfusion Lasix per heart failure team Doubt is a dialysis candidate but will hold off calling renal for now BMET in AM  GASTROINTESTINAL A:   No acute issues P:   Tube feeding per nutrition Pepcid IV for ulcer prophylaxis  HEMATOLOGIC Recent Labs    10/21/17 1636 10/22/17 0342  HGB 8.2* 8.8*   Lab Results  Component Value Date   INR 1.33 10/01/2017   INR 1.10 07/05/2016   INR 0.98 10/08/2012   A:   Anemia, thrombocytopenia - blood loss via chest tube Concern for hemolytic anemia with impella, note high LDH P:  PRBCs per CHF service, goal Hb 8 & above Transfused 4 units of packed cells 10/14/2017 Follow CBC and transfusion per cardiology service with 2 units of packed cells on 10/15/2017 Remains on heparin Right chest tube intermittently dumps up to 150 cc of bloody drainage. 10/19/2017 dumped 400 cc bloody drainage from chest tube during the night, hemoglobin noted to be drifting down. Consider transfusion we will leave this to cardiology at this time.  Spoke with cardiologist plans to transfuse.  10/19/2017 10/20/2017 hemoglobin is improved posttransfusion, he continues to have chest tube drainage is bloody approximately 5-600 cc per 24 hours  INFECTIOUS A:   Strep Pneumonia Bacteremia - 1/21 - completed 10 ds of rocephin Severe CAP HCAP? >  No evidence thus far based on BAL from 2/1 Fever 1/30 - new, considering RLL, sinusitis with NGT, multiple lines Parainfluenza virus pneumonia Persistent right pleural effusion appears complex and could be empyema despite right chest tube 10/19/2017 T-max 101 during the night.  He has been followed by infectious disease no cultures are positive at this time. P:   Vancomycin discontinued on 10/14/2017 Infectious disease consult 10/14/2017 appreciated Currently on Teflaro and Flagyl  per ID both  started on 10/15/2017 Suspect that yeast in BAL is a contaminant, empiric   ENDOCRINE CBG (last 3)  Recent Labs    10/21/17 1155 10/21/17 1954 10/22/17 0337  GLUCAP 140* 107* 90   A:   Hyperglycemia well controlled P:   Sliding scale insulin per protocol  NEUROLOGIC A:   Sedation Need / Mechanical Ventilation  Suspected seizure 10/14/2017 neurology consult Suspect he may be secondary to febrile state. EEG performed P:   RASS goal: -1  Sedation protocol: -1> continue precedex at  lower dose, PRN fentanyl, versed  EEG with no seizure activity noted.  Neurology has signed off as of 10/16/2017 10/20/2017 awake and follows commands no further seizure activity.  Much more awake  FAMILY  - Updates: Son and wife updated at length bedside, they still feel that there is more to be done.  Spoke with Dr. Missy Sabins and Dr. Aundra Dubin will come in at 1:15 to speak with the patient.  I am concerned that the family's optimism and lack of understanding will prolong the patient's suffering as I do not see a scenario where the patient will improve to the point of liberation from the ventilator and return to normal life.  Family will await arrival of Dr. Aundra Dubin to discuss case prior to deciding on trach.  The patient is critically ill with multiple organ systems failure and requires high complexity decision making for assessment and support, frequent evaluation and titration of therapies, application of advanced monitoring technologies and extensive interpretation of multiple databases.   Critical Care Time devoted to patient care services described in this note is  60  Minutes. This time reflects time of care of this signee Dr Jennet Maduro. This critical care time does not reflect procedure time, or teaching time or supervisory time of PA/NP/Med student/Med Resident etc but could involve care discussion time.  Rush Farmer, M.D. [facility] Pulmonary/Critical Care Medicine. Pager: [phone]. After  hours pager: [phone].

## 2017-10-22 NOTE — Progress Notes (Signed)
ANTICOAGULATION CONSULT NOTE - Follow Up Consult  Pharmacy Consult:  Heparin Indication: atrial fibrillation and LA thrombus  Allergies  Allergen Reactions  . Fentanyl And Related Other (See Comments)    Patients eyes roll in the back of his head and patient has seizure like activity    Patient Measurements: Height: 5\' 10"  (177.8 cm) Weight: 231 lb (104.8 kg)(weight obtained with 1 pillow on bed ) IBW/kg (Calculated) : 73  Heparin dosing weight = 84 kg  Vital Signs: Temp: 100.4 F (38 C) (02/12 1124) Temp Source: Bladder (02/12 1100) BP: 85/68 (02/12 1800) Pulse Rate: 60 (02/12 1800)  Labs: Recent Labs    10/20/17 0230  10/21/17 0354  10/21/17 1556 10/21/17 1636 10/22/17 0044 10/22/17 0342 10/22/17 0930 10/22/17 1648 10/22/17 1817  HGB 8.4*   < > 8.9*  --  19.0* 8.2*  --  8.8*  --  9.2*  --   HCT 26.8*   < > 28.1*  --  56.0* 24.0*  --  27.8*  --  27.0*  --   PLT 265  --  345  --   --   --   --  327  --   --   --   HEPARINUNFRC 0.32  --  0.39   < >  --   --  0.46  --  0.41  --  0.21*  CREATININE 2.91*   < > 3.05*  --  3.00* 3.10*  --  3.18*  --   --   --    < > = values in this interval not displayed.    Estimated Creatinine Clearance: 28.1 mL/min (A) (by C-G formula based on SCr of 3.18 mg/dL (H)).    Assessment: 36 YOM known to pharmacy for heparin management with impella. Impella removed 10/16/17 due to stoppage from small vegetation that got sucked into the catheter. Heparin resumed ~ 4 hours after removal for afib and LA thrombus.   Heparin level now subtherapeutic at 0.21. Hgb and plts remains stable, no further bleeding from chest tube reported.  Goal of Therapy:  Heparin level 0.3 units/ml Monitor platelets by anticoagulation protocol: Yes   Plan:  Increase heparin gtt to 1600 units/hr Check next heparin level with AM labs Daily heparin level and CBC  Adline Potter, PharmD Pharmacy Resident Pager: [phone]

## 2017-10-22 NOTE — Progress Notes (Signed)
Patient ID: Bryan Harmon, male   DOB: 23-Apr-1951, 67 y.o.   MRN: [id number]     Advanced Heart Failure Rounding Note  Primary Cardiologist: Aundra Dubin   Subjective:    Impella 5.0 placed in the OR 1/30.  Chest tube placed on right 1/31.   2/4 ID consulted for ongoing fever. 2/4 Neuro consulted for ?seizure. CT of head with no acute findings. EEG without seizure activity.  Echo for Impella placement on 2/5 showed a small vegetation on a mitral valve chord.   2/5 CT of chest/abd/pelvis: Worsening pulmonary infiltrates.  Necrotizing PNA on right with complex pleural effusion. Also with aggressive left base PNA.    2/6 Impella removed due to blockage => suspect small vegetation on MV chord was drawn into the Impella, stopping the device.   Remains on amio 30 mg/hr, milrinone 0.25 mcg/kg/min.  He is off norepinephrine. Remains intubated.  Has been weaning on PSV but does not last long due to increased respiratory rate/distress.   Tm 101.4.   Serosanguinous drainage from right chest tube.   Creatinine 2.2>2.5 >2.6>2.7>2.49 > 2.6 > 2.9 > 3.05 > 3.18  CVP 17. Coox 54%.   Echo: Severe LV dilation, EF 15-20%, severely dilated RV with severely decreased RV systolic function, moderate-severe MR, moderate-severe TR, cannot rule out LV apical thrombus.  Repeat echo with Definity showed definite LV apical thrombus.   TEE (1/28): EF 10-15%, diffuse hypokinesis, small LV apical thrombus, mildly dilated RV with moderately decreased systolic function, no LAA thrombus.    Objective:   Weight Range: 231 lb (104.8 kg) Body mass index is 33.15 kg/m.   Vital Signs:   Temp:  [99.9 F (37.7 C)-101.3 F (38.5 C)] 100.6 F (38.1 C) (02/12 0700) Pulse Rate:  [25-141] 108 (02/12 0545) Resp:  [8-36] 13 (02/12 0700) BP: (70-141)/(49-122) 75/57 (02/12 0700) SpO2:  [78 %-99 %] 96 % (02/12 0700) Arterial Line BP: (88-120)/(50-74) 98/62 (02/12 0700) FiO2 (%):  [30 %-40 %] 30 % (02/12 0600) Weight:   [231 lb (104.8 kg)] 231 lb (104.8 kg) (02/12 0256) Last BM Date: 10/21/17  Weight change: Filed Weights   10/20/17 0620 10/21/17 0500 10/22/17 0256  Weight: 203 lb (92.1 kg) 217 lb (98.4 kg) 231 lb (104.8 kg)    Intake/Output:   Intake/Output Summary (Last 24 hours) at 10/22/2017 0753 Last data filed at 10/22/2017 0700 Gross per 24 hour  Intake 3999.41 ml  Output 2725 ml  Net 1274.41 ml      Physical Exam  CVP 7-8  General: Intubated/sedated.  HEENT: + ETT Neck: Supple. JVP 7-8. Carotids 2+ bilat; no bruits. No thyromegaly or nodule noted. Cor: PMI nondisplaced. RRR, 2/6 HSM Apex. Soft 1-2+ edema to knees. 1+ BUE edema.  Lungs: Diminished basilar sounds. Mechanical breathing sounds.  Abdomen: Soft, non-tender, non-distended, no HSM. No bruits or masses. +BS  Extremities: No cyanosis, clubbing, or rash.  Neuro: Intubated/sedated   Telemetry   A fib 110s-120s, personally reviewed.   EKG    No new tracings.   Labs    CBC Recent Labs    10/21/17 0354  10/21/17 1636 10/22/17 0342  WBC 9.1  --   --  8.4  NEUTROABS 8.1*  --   --  7.6  HGB 8.9*   < > 8.2* 8.8*  HCT 28.1*   < > 24.0* 27.8*  MCV 97.2  --   --  97.9  PLT 345  --   --  327   < > =  values in this interval not displayed.   Basic Metabolic Panel Recent Labs    10/20/17 0230  10/21/17 0354  10/21/17 1636 10/22/17 0342  NA 144   < > 145   < > 146* 147*  K 3.8   < > 4.0   < > 3.7 3.8  CL 113*   < > 113*   < > 112* 116*  CO2 21*  --  21*  --   --  20*  GLUCOSE 139*   < > 174*   < > 116* 106*  BUN 93*   < > 94*   < > 81* 98*  CREATININE 2.91*   < > 3.05*   < > 3.10* 3.18*  CALCIUM 7.3*  --  7.5*  --   --  7.5*  MG 2.3  --  2.2  --   --  2.3  PHOS 4.5  --  5.2*  --   --   --    < > = values in this interval not displayed.   Liver Function Tests No results for input(s): AST, ALT, ALKPHOS, BILITOT, PROT, ALBUMIN in the last 72 hours. No results for input(s): LIPASE, AMYLASE in the last 72  hours. Cardiac Enzymes No results for input(s): CKTOTAL, CKMB, CKMBINDEX, TROPONINI in the last 72 hours.  BNP: BNP (last 3 results) Recent Labs    12/13/16 0949 02/11/17 1049 09/22/2017 1218  BNP 369.2* 562.7* 2,485.9*    ProBNP (last 3 results) No results for input(s): PROBNP in the last 8760 hours.   D-Dimer No results for input(s): DDIMER in the last 72 hours. Hemoglobin A1C No results for input(s): HGBA1C in the last 72 hours. Fasting Lipid Panel No results for input(s): CHOL, HDL, LDLCALC, TRIG, CHOLHDL, LDLDIRECT in the last 72 hours. Thyroid Function Tests No results for input(s): TSH, T4TOTAL, T3FREE, THYROIDAB in the last 72 hours.  Invalid input(s): FREET3  Other results:   Imaging    No results found.   Medications:     Scheduled Medications: . chlorhexidine gluconate (MEDLINE KIT)  15 mL Mouth Rinse BID  . Chlorhexidine Gluconate Cloth  6 each Topical Daily  . feeding supplement (PRO-STAT SUGAR FREE 64)  30 mL Per Tube TID  . furosemide  40 mg Intravenous Once  . Gerhardt's butt cream   Topical BID  . insulin aspart  2-6 Units Subcutaneous Q4H  . insulin glargine  12 Units Subcutaneous BID  . mouth rinse  15 mL Mouth Rinse 10 times per day  . sennosides  5 mL Oral Daily  . sodium chloride flush  3 mL Intravenous Q12H    Infusions: . sodium chloride 20 mL/hr at 10/15/17 0608  . amiodarone 30 mg/hr (10/22/17 0700)  . ceFTAROline (TEFLARO) IV Stopped (10/22/17 0145)  . dexmedetomidine (PRECEDEX) IV infusion for high rates 0.7 mcg/kg/hr (10/22/17 0700)  . famotidine (PEPCID) IV Stopped (10/21/17 0943)  . feeding supplement (VITAL 1.5 CAL) 1,000 mL (10/22/17 0442)  . furosemide (LASIX) infusion    . heparin 1,700 Units/hr (10/22/17 0700)  . metronidazole Stopped (10/22/17 0537)  . milrinone 0.375 mcg/kg/min (10/22/17 0746)  . norepinephrine (LEVOPHED) Adult infusion Stopped (10/21/17 1400)  . potassium chloride      PRN Medications: sodium  chloride, acetaminophen (TYLENOL) oral liquid 160 mg/5 mL, albuterol, magic mouthwash, midazolam, morphine injection, ondansetron (ZOFRAN) IV, sodium chloride flush, sodium chloride flush    Patient Profile  Mr Ruffins is a 68 year old with a history of PAF S/P  DC-CV 11/2016, s/p bilateral inguinal hernia repair 11/2017, PVCs, HTN, NICM, chronic systolic heart failure.   Sent from Urgent Care with A fib RVR. Acutely SOB on arrival   Assessment/Plan   1. Acute hypoxemic respiratory failure: RLL PNA, Strep pneumoniae in blood cultures. Respiratory cultures with parainfluenzae virus. Also acute/chronic systolic CHF.Now intubated s/p Impella 5.0 placement, multifocal PNA on CXR and CT chest. Remains critically ill.  Antibiotics broadened from ceftriaxone to vancomycin/meropenem on 1/30. BAL with Candida albicans, anidulafungin started 2/2.  Necrotizing PNA with complex pleural effusion on right on 2/5 CT, anti-microbials changed to ceftaroline and Flagyl on 2/5. Has had PSV trials daily.   - Right chest tube placed 1/31, still with serosanguineous drainage. - ID concerned that necrosis in right lung + loculated effusion may require surgery to quell infection.  However, I think he is likely too unstable for this.  Would there be any benefit from changing chest tube position? Will ask pulmonology.  - Will need tracheostomy.   No change.  2.Acute on chronic systolic CHF-> cardiogenic shock : Nonischemic cardiomyopathy. 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 Etiology of his CHF is not clear =>no definite inciting event. He has a history of HTN but doubt this was the only trigger. 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 This would ideally be confirmed by cMRI, but he has not wanted an MRI (concerned about side effects). He also has frequent PVCs, 21% total on last holter in 4/18 which is a risk for fall in EF.No  family history of CMP. Cannot rule out viral myocarditis. SPEP negative. With medical management, he initially felt a lot better. However, he quit all his meds in early 2018 with recurrence of NYHA III symptoms and onset of atrial fibrillation.He had TEE-DCCV in 3/18.TEE showed that EF remained25%. He quit his meds again in 4/18 and apparently did not restart them when I asked him to in 6/18. No meds probably since 4/18. Echo this admission with EF 15-20%, severe RV dysfunction, LV apical thrombus. RV is hypokinetic.  Attempted DCCV on 1/28 failed likely due to pressors/inotropes.  [place] 1/29 with low cardiac output => Impella 5.0 placed 1/30. On 2/6, Impella removed due to pump stop (suspect vegetation from MV chord sucked into Impella). Suspect combination of septic/vasogenic shock and cardiogenic shock.  Luiz Blare out on 2/10.   Coox 54% on milrinone 0.25 mcg/kg/min, norepinephrine off.  Creatinine up again to 3.18 but CVP now 17.  - Will need to restart Lasix today, will give 40 mg IV x 1 then 8 mg/hr infusion.  - Increase milrinone to 0.375 with co-ox 54%.  - End point still difficult to envision => would be poor LVAD candidate with renal failure and RV failure.   3. Atrial fibrillation:Persistent, now with RVR. HR up to 140sinitially, now 110s on amiodarone gtt at30. Not sure how long he has been in atrial fibrillation with RVR, but this may have triggered his CHF and AKI due to worsening of cardiac output.  -Heparin gtt ongoing. - Continue amiodarone gtt at 30 mg/hr.   - Failed TEE-guided DCCV on 1/28 in setting of vasoactive meds. He is now off norepinephrine but having to increase milrinone.  Will likely attempt TEE-guided DCCV one more time this week if he stays stable off pressor.   4. AKI: Creatinine 1.08 in 6/18, he has not been on any meds. Suspect cardiorenal syndrome with afib/RVR and fall in cardiac output.He was initially  hyperkalemic and acidotic.Today, BUN/creatinine continue  to rise but CVP also up.  - Support CO by increasing milrinone.  - Will need to start back on Lasix.  5. DX:AJOIN pneumo PNA as well as parainfluenza virus.CT chest 1/29 with multifocal PNA. On  1/30 to vancomycin/meropenem, then anidulafungin added.  ID has seen. Now on Teflaro and metronidazole. CT of chest/abd/pelvis 2/5 concerning for necrotizing right pneumonia with surrounding complex large pleural effusion.  He has a chest tube with ongoing serosanguineous drainage. 2/5 showed a small vegetation on a mitral valve chord.  Repeat ECHO on 2/6 did not show vegetation. Assumed it was pulled into Impella.  He is now off [employer] but febrile to 100.9 again last night. - Continue current antibiotics, so far additional culture negative.  WBCs trending down overall.  - Necrosis in right lung + loculated effusion may ideally require surgery to effectively treat infection, however I do not think he would be likely to survive surgery at this point.  Would moving chest tube be of any help?   6. Elevated LFTs: Suspect shock liver type picture. - AST/ALT > 1000 at admission but have trended down steadily. No change.  7. LV thrombus: Noted on TTE and TEE, small.  - Remains on heparin.   8. Hypernatremia: Mild, Na 147 today.  9. Malnutrition: tube feeds on -going 10. Anemia: Significant blood loss via chest tube and mouth.   Received 2UPRBCs 2/3, 2/4 5 uPRBCs, 2/5 2UPRBCs. 2/6 2U PRBCs. 1 unit PRBCs 2/9.  Hgb stable today. 11. Suspected Deep Tissue Injury -->Sacrum/R buttock/Mid back . Continue to repostion R/L. WOC consult appreciated. 12. Seizure- Neurology appreciated. CT head negative. EEG no seizure noted.  No change.   Patient is critically ill and remains in multisystem organ failure.  Family wishes to continue aggressive treatment.  He is partial code for now. (No CPR but OK for ACLS meds).  At this point, considering comfort care would be very appropriate as I do not see an effective way forward  with good quality of life.  However, they want aggressive care still.  I spoke with them for about 30 minutes this morning.   CRITICAL CARE Performed by: Loralie Champagne  Total critical care time: 40 minutes  Critical care time was exclusive of separately billable procedures and treating other patients.  Critical care was necessary to treat or prevent imminent or life-threatening deterioration.  Critical care was time spent personally by me on the following activities: development of treatment plan with patient and/or surrogate as well as nursing, discussions with consultants, evaluation of patient's response to treatment, examination of patient, obtaining history from patient or surrogate, ordering and performing treatments and interventions, ordering and review of laboratory studies, ordering and review of radiographic studies, pulse oximetry and re-evaluation of patient's condition.  Loralie Champagne, MD 10/22/2017 7:53 AM

## 2017-10-22 NOTE — Progress Notes (Signed)
Multiple conversations throughout the day.  Attempting to determine if family would wish for trach or not.  They are pleasant but very unrealistic.  Explained the entire case multiple times.  Patient's code status is unchanged at this point.  They wish to speak with Dr. [name] prior to decision on trach/peg.  PCCM will continue to follow.  The patient is critically ill with multiple organ systems failure and requires high complexity decision making for assessment and support, frequent evaluation and titration of therapies, application of advanced monitoring technologies and extensive interpretation of multiple databases.   Critical Care Time devoted to patient care services described in this note is  60  Minutes. This time reflects time of care of this signee Dr [name]. This critical care time does not reflect procedure time, or teaching time or supervisory time of PA/NP/Med student/Med Resident etc but could involve care discussion time.  Alyson Reedy, M.D. [facility] Pulmonary/Critical Care Medicine. Pager: [phone]. After hours pager: [phone].

## 2017-10-22 NOTE — Progress Notes (Signed)
13 Days Post-Op Procedure(s) (LRB): PLACEMENT OF IMPELLA 5.0 LEFT VENTRICULAR ASSIST DEVICE (N/A) TRANSESOPHAGEAL ECHOCARDIOGRAM (TEE) (N/A) Subjective: Serosanguinous R chest tube output slightly improved Hb stable Objective: Vital signs in last 24 hours: Temp:  [99.9 F (37.7 C)-101.3 F (38.5 C)] 100.6 F (38.1 C) (02/12 0700) Pulse Rate:  [25-141] 108 (02/12 0545) Cardiac Rhythm: Atrial fibrillation (02/12 0600) Resp:  [8-36] 13 (02/12 0700) BP: (70-141)/(49-122) 75/57 (02/12 0700) SpO2:  [78 %-97 %] 96 % (02/12 0700) Arterial Line BP: (88-120)/(50-74) 98/62 (02/12 0700) FiO2 (%):  [30 %-40 %] 30 % (02/12 0600) Weight:  [231 lb (104.8 kg)] 231 lb (104.8 kg) (02/12 0256)  Hemodynamic parameters for last 24 hours: CVP:  [10 mmHg-18 mmHg] 16 mmHg  Intake/Output from previous day: 02/11 0701 - 02/12 0700 In: 3999.4 [I.V.:1509.4; NG/GT:1640; IV Piggyback:850] Out: 2725 [Urine:1915; Stool:400; Chest Tube:410] Intake/Output this shift: No intake/output data recorded.  Incisions clean, dry  Lab Results: Recent Labs    10/21/17 0354  10/21/17 1636 10/22/17 0342  WBC 9.1  --   --  8.4  HGB 8.9*   < > 8.2* 8.8*  HCT 28.1*   < > 24.0* 27.8*  PLT 345  --   --  327   < > = values in this interval not displayed.   BMET:  Recent Labs    10/21/17 0354  10/21/17 1636 10/22/17 0342  NA 145   < > 146* 147*  K 4.0   < > 3.7 3.8  CL 113*   < > 112* 116*  CO2 21*  --   --  20*  GLUCOSE 174*   < > 116* 106*  BUN 94*   < > 81* 98*  CREATININE 3.05*   < > 3.10* 3.18*  CALCIUM 7.5*  --   --  7.5*   < > = values in this interval not displayed.    PT/INR: No results for input(s): LABPROT, INR in the last 72 hours. ABG    Component Value Date/Time   PHART 7.325 (L) 10/22/2017 0424   HCO3 20.4 10/22/2017 0424   TCO2 22 10/22/2017 0424   ACIDBASEDEF 5.0 (H) 10/22/2017 0424   O2SAT 92.0 10/22/2017 0424   CBG (last 3)  Recent Labs    10/21/17 1155 10/21/17 1954  10/22/17 0337  GLUCAP 140* 107* 90    Assessment/Plan: S/P Procedure(s) (LRB): PLACEMENT OF IMPELLA 5.0 LEFT VENTRICULAR ASSIST DEVICE (N/A) TRANSESOPHAGEAL ECHOCARDIOGRAM (TEE) (N/A) Cont chest tube to water seal   LOS: 22 days    Kathlee Nations Trigt III 10/22/2017

## 2017-10-22 NOTE — Progress Notes (Signed)
ANTICOAGULATION CONSULT NOTE - Follow Up Consult  Pharmacy Consult for heparin Indication: atrial fibrillation and LA thrombus  Labs: Recent Labs    10/19/17 0304  10/20/17 0230  10/21/17 0354 10/21/17 1533 10/21/17 1556 10/21/17 1636 10/22/17 0044  HGB 7.6*   < > 8.4*   < > 8.9*  --  19.0* 8.2*  --   HCT 24.4*   < > 26.8*   < > 28.1*  --  56.0* 24.0*  --   PLT 216  --  265  --  345  --   --   --   --   HEPARINUNFRC 0.30  --  0.32  --  0.39 0.54  --   --  0.46  CREATININE 2.64*   < > 2.91*   < > 3.05*  --  3.00* 3.10*  --    < > = values in this interval not displayed.    Assessment: 67yo male remains above tight goal on heparin after rate change.  Goal of Therapy:  Heparin level ~0.3 units/ml   Plan:  Will decrease heparin gtt by 1-2 units/kg/hr to 1700 units/hr and check level in 8 hours.   Vernard Gambles, PharmD, BCPS  10/22/2017,1:32 AM

## 2017-10-22 NOTE — Significant Event (Signed)
Family insisting that staff wean patient from ventilator and requesting that RN turn down precedex drip. Patient is awake, but does not currently follow commands, is labored breathing, with use of accessory muscles. RN and RRT Lyla Son provided education. But family would like for staff to continue with weaning nonetheless.    Bryan Harmon

## 2017-10-23 ENCOUNTER — Inpatient Hospital Stay (HOSPITAL_COMMUNITY): Payer: Medicare Other

## 2017-10-23 DIAGNOSIS — Z95828 Presence of other vascular implants and grafts: Secondary | ICD-10-CM

## 2017-10-23 DIAGNOSIS — R6511 Systemic inflammatory response syndrome (SIRS) of non-infectious origin with acute organ dysfunction: Secondary | ICD-10-CM

## 2017-10-23 DIAGNOSIS — I509 Heart failure, unspecified: Secondary | ICD-10-CM

## 2017-10-23 LAB — GLUCOSE, CAPILLARY
GLUCOSE-CAPILLARY: 111 mg/dL — AB (ref 65–99)
GLUCOSE-CAPILLARY: 117 mg/dL — AB (ref 65–99)
Glucose-Capillary: 110 mg/dL — ABNORMAL HIGH (ref 65–99)
Glucose-Capillary: 115 mg/dL — ABNORMAL HIGH (ref 65–99)
Glucose-Capillary: 116 mg/dL — ABNORMAL HIGH (ref 65–99)
Glucose-Capillary: 119 mg/dL — ABNORMAL HIGH (ref 65–99)
Glucose-Capillary: 122 mg/dL — ABNORMAL HIGH (ref 65–99)

## 2017-10-23 LAB — POCT I-STAT 3, ART BLOOD GAS (G3+)
ACID-BASE DEFICIT: 5 mmol/L — AB (ref 0.0–2.0)
BICARBONATE: 19.1 mmol/L — AB (ref 20.0–28.0)
O2 SAT: 96 %
PCO2 ART: 29.8 mmHg — AB (ref 32.0–48.0)
PO2 ART: 81 mmHg — AB (ref 83.0–108.0)
Patient temperature: 38.1
TCO2: 20 mmol/L — ABNORMAL LOW (ref 22–32)
pH, Arterial: 7.419 (ref 7.350–7.450)

## 2017-10-23 LAB — HEPARIN LEVEL (UNFRACTIONATED)
HEPARIN UNFRACTIONATED: 0.12 [IU]/mL — AB (ref 0.30–0.70)
Heparin Unfractionated: 0.28 IU/mL — ABNORMAL LOW (ref 0.30–0.70)

## 2017-10-23 LAB — CBC WITH DIFFERENTIAL/PLATELET
BASOS PCT: 0 %
Basophils Absolute: 0 10*3/uL (ref 0.0–0.1)
EOS ABS: 0 10*3/uL (ref 0.0–0.7)
EOS PCT: 0 %
HCT: 25 % — ABNORMAL LOW (ref 39.0–52.0)
HEMOGLOBIN: 7.7 g/dL — AB (ref 13.0–17.0)
Lymphocytes Relative: 9 %
Lymphs Abs: 0.6 10*3/uL — ABNORMAL LOW (ref 0.7–4.0)
MCH: 30.3 pg (ref 26.0–34.0)
MCHC: 30.8 g/dL (ref 30.0–36.0)
MCV: 98.4 fL (ref 78.0–100.0)
MONO ABS: 0 10*3/uL — AB (ref 0.1–1.0)
MONOS PCT: 0 %
Neutro Abs: 6.7 10*3/uL (ref 1.7–7.7)
Neutrophils Relative %: 91 %
PLATELETS: 323 10*3/uL (ref 150–400)
RBC: 2.54 MIL/uL — ABNORMAL LOW (ref 4.22–5.81)
RDW: 20.6 % — AB (ref 11.5–15.5)
WBC: 7.4 10*3/uL (ref 4.0–10.5)

## 2017-10-23 LAB — BASIC METABOLIC PANEL
Anion gap: 12 (ref 5–15)
BUN: 102 mg/dL — AB (ref 6–20)
CALCIUM: 7.3 mg/dL — AB (ref 8.9–10.3)
CO2: 20 mmol/L — ABNORMAL LOW (ref 22–32)
Chloride: 116 mmol/L — ABNORMAL HIGH (ref 101–111)
Creatinine, Ser: 3.35 mg/dL — ABNORMAL HIGH (ref 0.61–1.24)
GFR calc non Af Amer: 18 mL/min — ABNORMAL LOW (ref >60)
GFR, EST AFRICAN AMERICAN: 21 mL/min — AB (ref >60)
Glucose, Bld: 117 mg/dL — ABNORMAL HIGH (ref 65–99)
Potassium: 3.7 mmol/L (ref 3.5–5.1)
SODIUM: 148 mmol/L — AB (ref 135–145)

## 2017-10-23 LAB — PHOSPHORUS: PHOSPHORUS: 4.3 mg/dL (ref 2.5–4.6)

## 2017-10-23 LAB — COOXEMETRY PANEL
Carboxyhemoglobin: 1.4 % (ref 0.5–1.5)
Methemoglobin: 1.2 % (ref 0.0–1.5)
O2 Saturation: 54.8 %
Total hemoglobin: 12.1 g/dL (ref 12.0–16.0)

## 2017-10-23 LAB — POCT I-STAT, CHEM 8
BUN: 94 mg/dL — AB (ref 6–20)
CALCIUM ION: 1.07 mmol/L — AB (ref 1.15–1.40)
CHLORIDE: 114 mmol/L — AB (ref 101–111)
Creatinine, Ser: 3.3 mg/dL — ABNORMAL HIGH (ref 0.61–1.24)
GLUCOSE: 145 mg/dL — AB (ref 65–99)
HCT: 27 % — ABNORMAL LOW (ref 39.0–52.0)
Hemoglobin: 9.2 g/dL — ABNORMAL LOW (ref 13.0–17.0)
POTASSIUM: 3.6 mmol/L (ref 3.5–5.1)
Sodium: 149 mmol/L — ABNORMAL HIGH (ref 135–145)
TCO2: 20 mmol/L — ABNORMAL LOW (ref 22–32)

## 2017-10-23 LAB — HEMOGLOBIN AND HEMATOCRIT, BLOOD
HEMATOCRIT: 29.5 % — AB (ref 39.0–52.0)
Hemoglobin: 9.1 g/dL — ABNORMAL LOW (ref 13.0–17.0)

## 2017-10-23 LAB — HEPATIC FUNCTION PANEL
ALK PHOS: 85 U/L (ref 38–126)
ALT: 32 U/L (ref 17–63)
AST: 35 U/L (ref 15–41)
Albumin: 1.5 g/dL — ABNORMAL LOW (ref 3.5–5.0)
BILIRUBIN DIRECT: 0.2 mg/dL (ref 0.1–0.5)
BILIRUBIN INDIRECT: 0.8 mg/dL (ref 0.3–0.9)
BILIRUBIN TOTAL: 1 mg/dL (ref 0.3–1.2)
Total Protein: 4.7 g/dL — ABNORMAL LOW (ref 6.5–8.1)

## 2017-10-23 LAB — PREPARE RBC (CROSSMATCH)

## 2017-10-23 LAB — MAGNESIUM: Magnesium: 2.2 mg/dL (ref 1.7–2.4)

## 2017-10-23 LAB — CALCIUM, IONIZED: Calcium, Ionized, Serum: 4.3 mg/dL — ABNORMAL LOW (ref 4.5–5.6)

## 2017-10-23 LAB — LACTATE DEHYDROGENASE: LDH: 391 U/L — ABNORMAL HIGH (ref 98–192)

## 2017-10-23 MED ORDER — PRO-STAT SUGAR FREE PO LIQD
30.0000 mL | Freq: Two times a day (BID) | ORAL | Status: DC
  Administered 2017-10-23 – 2017-10-27 (×8): 30 mL
  Filled 2017-10-23 (×8): qty 30

## 2017-10-23 MED ORDER — SODIUM CHLORIDE 0.9 % IV SOLN
Freq: Once | INTRAVENOUS | Status: AC
  Administered 2017-10-23: 10 mL/h via INTRAVENOUS

## 2017-10-23 NOTE — Consult Note (Signed)
WOC Nurse wound follow up Wound type: Deep tissue injury sacrum: evolved now into three open areas that are partial thickness. One on the coccyx, one small on the inner left upper aspect of the gluteal fold, one on the inner aspect of the right buttock  Areas of deep tissue injury along the patient's spine have resolved, continue to protect them with foam Measurement: Inner left buttock 0.3cm x 0.1cm x0.1cm: 100% yellow Inner right buttock (aprox) 4cm x 2.5cm x 0.1cm: 100% yellow but with epithelial buds throughout the wound bed Coccyx: dark, superficial peeling skin, non blanchable  Wound bed: see above  Drainage (amount, consistency, odor) serosanguinous from the right buttock only, minimal amounts  Periwound: edematous, entire body Dressing procedure/placement/frequency: Continue silicone foam dressings to protect vunerable tissue along spine Continue silicone foam in the gluteal cleft to absorb moisture and protect,insulate.   Prevalon boots added by the bedside nurse for prevention of heel pressure injuries Unna's boots added by medical team.   WOC Nurse team will follow along with you for weekly wound assessments.  Please notify me of any acute changes in the wounds or any new areas of concerns Lenox Bink [facility] MSN, RN,CWOCN, CNS, CWON-AP [phone]

## 2017-10-23 NOTE — Progress Notes (Signed)
RT switched vent to full support for pt to rest tonight. Will begin wean again in the morning if vitals stable. RT to cont to monitor.

## 2017-10-23 NOTE — Progress Notes (Signed)
Subjective: Per PCCM family meeting this AM and family is requesting second opinion specifically from either Dr. Johnnye Sima or Megan Salon. Over the weekend the patient was seen by Dr. Megan Salon; however, we will attempt to get either one to come see the patient.   Patient is alert and shakes his head to yes/no questions.   Antibiotics:  Anti-infectives (From admission, onward)   Start     Dose/Rate Route Frequency Ordered Stop   10/22/17 1200  ceftaroline (TEFLARO) 300 mg in sodium chloride 0.9 % 250 mL IVPB     300 mg 250 mL/hr over 60 Minutes Intravenous Every 12 hours 10/22/17 1055     10/15/17 1200  ceftaroline (TEFLARO) 400 mg in sodium chloride 0.9 % 250 mL IVPB  Status:  Discontinued     400 mg 250 mL/hr over 60 Minutes Intravenous Every 12 hours 10/15/17 1052 10/22/17 1055   10/15/17 1100  metroNIDAZOLE (FLAGYL) IVPB 500 mg     500 mg 100 mL/hr over 60 Minutes Intravenous Every 8 hours 10/15/17 1052     10/13/17 1200  anidulafungin (ERAXIS) 100 mg in sodium chloride 0.9 % 100 mL IVPB  Status:  Discontinued     100 mg 78 mL/hr over 100 Minutes Intravenous Every 24 hours 10/12/17 1123 10/14/17 1238   10/12/17 1200  anidulafungin (ERAXIS) 200 mg in sodium chloride 0.9 % 200 mL IVPB     200 mg 78 mL/hr over 200 Minutes Intravenous  Once 10/12/17 1123 10/12/17 1702   10/03/2017 2000  vancomycin (VANCOCIN) IVPB 1000 mg/200 mL premix  Status:  Discontinued     1,000 mg 200 mL/hr over 60 Minutes Intravenous Every 24 hours 09/14/2017 1754 10/14/17 0951   09/13/2017 1800  meropenem (MERREM) 1 g in sodium chloride 0.9 % 100 mL IVPB  Status:  Discontinued     1 g 200 mL/hr over 30 Minutes Intravenous Every 12 hours 09/27/2017 1720 10/15/17 1052   09/22/2017 0912  vancomycin (VANCOCIN) 1,000 mg in sodium chloride 0.9 % 1,000 mL irrigation  Status:  Discontinued       As needed 09/26/2017 0912 10/04/2017 1115   09/29/2017 0700  fluconazole (DIFLUCAN) IVPB 400 mg  Status:  Discontinued     400  mg 100 mL/hr over 120 Minutes Intravenous To Surgery 10/03/2017 0650 09/28/2017 1115   10/06/2017 0700  rifampin (RIFADIN) 600 mg in sodium chloride 0.9 % 100 mL IVPB  Status:  Discontinued     600 mg 200 mL/hr over 30 Minutes Intravenous To Surgery 09/19/2017 0650 09/22/2017 1115   09/12/2017 0700  vancomycin (VANCOCIN) powder 1,000 mg  Status:  Discontinued     1,000 mg Other To Surgery 09/25/2017 0650 09/17/2017 1115   10/01/2017 0400  vancomycin (VANCOCIN) 1,250 mg in sodium chloride 0.9 % 250 mL IVPB     1,250 mg 166.7 mL/hr over 90 Minutes Intravenous To Surgery 10/01/2017 1910 09/25/2017 1222   09/28/2017 0400  cefUROXime (ZINACEF) 1.5 g in dextrose 5 % 50 mL IVPB     1.5 g 100 mL/hr over 30 Minutes Intravenous To Surgery 09/15/2017 1910 09/25/2017 1202   09/16/2017 0400  cefUROXime (ZINACEF) 750 mg in dextrose 5 % 50 mL IVPB  Status:  Discontinued     750 mg 100 mL/hr over 30 Minutes Intravenous To Surgery 09/28/2017 1910 10/04/2017 1115   10/01/17 0600  cefTRIAXone (ROCEPHIN) 2 g in dextrose 5 % 50 mL IVPB  Status:  Discontinued     2  g 100 mL/hr over 30 Minutes Intravenous Every 24 hours 10/01/17 0541 09/13/2017 1705   09/20/2017 1315  doxycycline (VIBRA-TABS) tablet 100 mg  Status:  Discontinued     100 mg Oral Every 12 hours 10/04/2017 1301 10/02/17 1346   09/27/2017 1315  cefTRIAXone (ROCEPHIN) 1 g in dextrose 5 % 50 mL IVPB  Status:  Discontinued     1 g 100 mL/hr over 30 Minutes Intravenous Every 24 hours 09/13/2017 1301 10/01/17 0541     Medications: Scheduled Meds: . chlorhexidine gluconate (MEDLINE KIT)  15 mL Mouth Rinse BID  . Chlorhexidine Gluconate Cloth  6 each Topical Daily  . feeding supplement (PRO-STAT SUGAR FREE 64)  30 mL Per Tube TID  . Gerhardt's butt cream   Topical BID  . insulin aspart  2-6 Units Subcutaneous Q4H  . insulin glargine  12 Units Subcutaneous BID  . mouth rinse  15 mL Mouth Rinse 10 times per day  . sennosides  5 mL Oral Daily  . sodium chloride flush  3 mL Intravenous Q12H    Continuous Infusions: . sodium chloride 20 mL/hr at 10/15/17 0608  . sodium chloride    . amiodarone 30 mg/hr (10/23/17 0700)  . ceFTAROline (TEFLARO) IV Stopped (10/23/17 0105)  . dexmedetomidine (PRECEDEX) IV infusion for high rates 0.4 mcg/kg/hr (10/23/17 0756)  . famotidine (PEPCID) IV Stopped (10/22/17 0949)  . feeding supplement (VITAL 1.5 CAL) 1,000 mL (10/22/17 2156)  . furosemide (LASIX) infusion 8 mg/hr (10/23/17 0700)  . heparin 1,700 Units/hr (10/23/17 0800)  . metronidazole Stopped (10/23/17 0536)  . milrinone 0.375 mcg/kg/min (10/23/17 0700)  . norepinephrine (LEVOPHED) Adult infusion Stopped (10/23/17 0824)   PRN Meds:.sodium chloride, acetaminophen (TYLENOL) oral liquid 160 mg/5 mL, albuterol, magic mouthwash, midazolam, morphine injection, ondansetron (ZOFRAN) IV, sodium chloride flush, sodium chloride flush  Objective: Weight change:  (-1.814 kg)  Intake/Output Summary (Last 24 hours) at 10/23/2017 0846 Last data filed at 10/23/2017 0800 Gross per 24 hour  Intake 4057.03 ml  Output 3585 ml  Net 472.03 ml   Blood pressure 99/74, pulse (!) 59, temperature (!) 100.9 F (38.3 C), resp. rate (!) 5, height _0  (1.778 m), weight 227 lb (103 kg), SpO2 94 %. Temp:  [100.4 F (38 C)-101.3 F (38.5 C)] 100.9 F (38.3 C) (02/13 0815) Pulse Rate:  [28-141] 59 (02/13 0815) Resp:  [0-30] 5 (02/13 0815) BP: (74-113)/(54-79) 99/74 (02/13 0815) SpO2:  [90 %-99 %] 94 % (02/13 0824) Arterial Line BP: (84-117)/(52-75) 107/72 (02/13 0815) FiO2 (%):  [30 %-50 %] 40 % (02/13 0824) Weight:  [227 lb (103 kg)] 227 lb (103 kg) (02/13 0500)  Physical Exam: General: Alert and awake, in no acute distress HEENT: Anicteric sclera, pupils reactive to light and accommodation, EOMI CVS: Distant heart sounds, tachycardic, no murmurs, no rubs  Chest: Intubated with diffuse crackles  Abdomen: Soft nontender, nondistended, normal bowel sounds Extremities: No clubbing or edema noted  bilaterally Skin: No rashes Lymph: No new lymphadenopathy Neuro: Nonfocal  CBC: _1 (wbc3,Hgb:3,Hct:3,Plt:3,INR:3APTT:3)@  BMET Recent Labs    10/22/17 0342 10/22/17 1648 10/23/17 0435  NA 147* 150* 148*  K 3.8 4.3 3.7  CL 116*  --  116*  CO2 20*  --  20*  GLUCOSE 106* 143* 117*  BUN 98*  --  102*  CREATININE 3.18*  --  3.35*  CALCIUM 7.5*  --  7.3*   Liver Panel  No results for input(s): PROT, ALBUMIN, AST, ALT, ALKPHOS, BILITOT, BILIDIR, IBILI in the last 72  hours.  Sedimentation Rate No results for input(s): ESRSEDRATE in the last 72 hours. C-Reactive Protein No results for input(s): CRP in the last 72 hours.  Micro Results: Recent Results (from the past 720 hour(s))  Culture, blood (Routine X 2) w Reflex to ID Panel     Status: Abnormal   Collection Time: 09/17/2017  1:08 PM  Result Value Ref Range Status   Specimen Description BLOOD RIGHT HAND  Final   Special Requests   Final    BOTTLES DRAWN AEROBIC AND ANAEROBIC Blood Culture adequate volume   Culture  Setup Time   Final    GRAM POSITIVE COCCI AEROBIC BOTTLE ONLY CRITICAL RESULT CALLED TO, READ BACK BY AND VERIFIED WITH: J LEDFORD PHARMD 0532 10/01/17 A BROWNING    Culture STREPTOCOCCUS PNEUMONIAE (A)  Final   Report Status 10/03/2017 FINAL  Final   Organism ID, Bacteria STREPTOCOCCUS PNEUMONIAE  Final      Susceptibility   Streptococcus pneumoniae - MIC*    ERYTHROMYCIN <=0.12 SENSITIVE Sensitive     LEVOFLOXACIN 0.5 SENSITIVE Sensitive     PENICILLIN (non-meningitis) 0.25 SENSITIVE Sensitive     CEFTRIAXONE (non-meningitis) <=0.12 SENSITIVE Sensitive     * STREPTOCOCCUS PNEUMONIAE  Blood Culture ID Panel (Reflexed)     Status: Abnormal   Collection Time: 09/23/2017  1:08 PM  Result Value Ref Range Status   Enterococcus species NOT DETECTED NOT DETECTED Final   Listeria monocytogenes NOT DETECTED NOT DETECTED Final   Staphylococcus species NOT DETECTED NOT DETECTED Final   Staphylococcus aureus  NOT DETECTED NOT DETECTED Final   Streptococcus species DETECTED (A) NOT DETECTED Final    Comment: CRITICAL RESULT CALLED TO, READ BACK BY AND VERIFIED WITH: J LEDFORD PHARMD 0532 10/01/17 A BROWNING    Streptococcus agalactiae NOT DETECTED NOT DETECTED Final   Streptococcus pneumoniae DETECTED (A) NOT DETECTED Final    Comment: CRITICAL RESULT CALLED TO, READ BACK BY AND VERIFIED WITH: J LEDFORD PHARMD 0532 10/01/17 A BROWNING    Streptococcus pyogenes NOT DETECTED NOT DETECTED Final   Acinetobacter baumannii NOT DETECTED NOT DETECTED Final   Enterobacteriaceae species NOT DETECTED NOT DETECTED Final   Enterobacter cloacae complex NOT DETECTED NOT DETECTED Final   Escherichia coli NOT DETECTED NOT DETECTED Final   Klebsiella oxytoca NOT DETECTED NOT DETECTED Final   Klebsiella pneumoniae NOT DETECTED NOT DETECTED Final   Proteus species NOT DETECTED NOT DETECTED Final   Serratia marcescens NOT DETECTED NOT DETECTED Final   Haemophilus influenzae NOT DETECTED NOT DETECTED Final   Neisseria meningitidis NOT DETECTED NOT DETECTED Final   Pseudomonas aeruginosa NOT DETECTED NOT DETECTED Final   Candida albicans NOT DETECTED NOT DETECTED Final   Candida glabrata NOT DETECTED NOT DETECTED Final   Candida krusei NOT DETECTED NOT DETECTED Final   Candida parapsilosis NOT DETECTED NOT DETECTED Final   Candida tropicalis NOT DETECTED NOT DETECTED Final  Culture, blood (Routine X 2) w Reflex to ID Panel     Status: None   Collection Time: 09/28/2017  1:11 PM  Result Value Ref Range Status   Specimen Description BLOOD LEFT FOREARM  Final   Special Requests IN PEDIATRIC BOTTLE Blood Culture adequate volume  Final   Culture NO GROWTH 5 DAYS  Final   Report Status 10/05/2017 FINAL  Final  Respiratory Panel by PCR     Status: Abnormal   Collection Time: 10/02/2017  1:51 PM  Result Value Ref Range Status   Adenovirus NOT DETECTED NOT DETECTED Final  Coronavirus 229E NOT DETECTED NOT DETECTED Final     Coronavirus HKU1 NOT DETECTED NOT DETECTED Final   Coronavirus NL63 NOT DETECTED NOT DETECTED Final   Coronavirus OC43 NOT DETECTED NOT DETECTED Final   Metapneumovirus NOT DETECTED NOT DETECTED Final   Rhinovirus / Enterovirus NOT DETECTED NOT DETECTED Final   Influenza A NOT DETECTED NOT DETECTED Final   Influenza B NOT DETECTED NOT DETECTED Final   Parainfluenza Virus 1 NOT DETECTED NOT DETECTED Final   Parainfluenza Virus 2 NOT DETECTED NOT DETECTED Final   Parainfluenza Virus 3 DETECTED (A) NOT DETECTED Final   Parainfluenza Virus 4 NOT DETECTED NOT DETECTED Final   Respiratory Syncytial Virus NOT DETECTED NOT DETECTED Final   Bordetella pertussis NOT DETECTED NOT DETECTED Final   Chlamydophila pneumoniae NOT DETECTED NOT DETECTED Final   Mycoplasma pneumoniae NOT DETECTED NOT DETECTED Final  Culture, blood (routine x 2)     Status: None   Collection Time: 09/12/2017  4:45 PM  Result Value Ref Range Status   Specimen Description BLOOD LEFT HAND  Final   Special Requests IN PEDIATRIC BOTTLE Blood Culture adequate volume  Final   Culture NO GROWTH 5 DAYS  Final   Report Status 10/05/2017 FINAL  Final  MRSA PCR Screening     Status: None   Collection Time: 10/04/2017  4:49 PM  Result Value Ref Range Status   MRSA by PCR NEGATIVE NEGATIVE Final    Comment:        The GeneXpert MRSA Assay (FDA approved for NASAL specimens only), is one component of a comprehensive MRSA colonization surveillance program. It is not intended to diagnose MRSA infection nor to guide or monitor treatment for MRSA infections.   Culture, blood (routine x 2)     Status: None   Collection Time: 09/18/2017  5:06 PM  Result Value Ref Range Status   Specimen Description BLOOD RIGHT HAND  Final   Special Requests IN PEDIATRIC BOTTLE Blood Culture adequate volume  Final   Culture NO GROWTH 5 DAYS  Final   Report Status 10/05/2017 FINAL  Final  Culture, blood (Routine X 2) w Reflex to ID Panel     Status:  None   Collection Time: 09/21/2017  4:18 PM  Result Value Ref Range Status   Specimen Description BLOOD RIGHT ANTECUBITAL  Final   Special Requests IN PEDIATRIC BOTTLE Blood Culture adequate volume  Final   Culture   Final    NO GROWTH 5 DAYS Performed at [facility], [place] [address] [place], [place] [address]    Report Status 10/12/2017 FINAL  Final  Culture, blood (Routine X 2) w Reflex to ID Panel     Status: None   Collection Time: 10/01/2017  4:20 PM  Result Value Ref Range Status   Specimen Description BLOOD RIGHT ANTECUBITAL  Final   Special Requests IN PEDIATRIC BOTTLE Blood Culture adequate volume  Final   Culture   Final    NO GROWTH 5 DAYS Performed at [facility], [place] [address] [place], [place] [address]    Report Status 10/12/2017 FINAL  Final  Surgical pcr screen     Status: Abnormal   Collection Time: 09/18/2017  8:37 PM  Result Value Ref Range Status   MRSA, PCR RESULT CALLED TO, READ BACK BY AND VERIFIED WITH: (A) NEGATIVE Final    Comment:  M AMO RN 09/10/2017 0504 JDW   Staphylococcus aureus INVALID RESULTS, SPECIMEN SENT FOR CULTURE (A) NEGATIVE  Final    Comment: Results Called to:  M [facility] 09/24/2017 0504 JDW (NOTE) The Xpert SA Assay (FDA approved for NASAL specimens in patients 55 years of age and older), is one component of a comprehensive surveillance program. It is not intended to diagnose infection nor to guide or monitor treatment.   MRSA culture     Status: None   Collection Time: 09/19/2017  8:37 PM  Result Value Ref Range Status   Specimen Description NASOPHARYNGEAL  Final   Special Requests NONE  Final   Culture NO MRSA DETECTED  Final   Report Status 10/10/2017 FINAL  Final  Culture, blood (routine x 2)     Status: None   Collection Time: 09/20/2017  5:52 PM  Result Value Ref Range Status   Specimen Description BLOOD RIGHT ANTECUBITAL  Final   Special Requests IN PEDIATRIC BOTTLE Blood Culture adequate volume  Final   Culture   Final    NO  GROWTH 5 DAYS Performed at [facility], [place] [address] [place], [place] [address]    Report Status 10/14/2017 FINAL  Final  Culture, blood (routine x 2)     Status: None   Collection Time: 09/28/2017  6:00 PM  Result Value Ref Range Status   Specimen Description BLOOD RIGHT HAND  Final   Special Requests IN PEDIATRIC BOTTLE Blood Culture adequate volume  Final   Culture   Final    NO GROWTH 5 DAYS Performed at [facility], [place] [address] [place], [place] [address]    Report Status 10/14/2017 FINAL  Final  Culture, respiratory (NON-Expectorated)     Status: None   Collection Time: 10/10/17  7:55 AM  Result Value Ref Range Status   Specimen Description TRACHEAL ASPIRATE  Final   Special Requests NONE  Final   Gram Stain   Final    RARE WBC PRESENT, PREDOMINANTLY MONONUCLEAR NO ORGANISMS SEEN Performed at [facility], [place] [address] [place], [place] [address]    Culture RARE CANDIDA ALBICANS  Final   Report Status 10/12/2017 FINAL  Final  Culture, bal-quantitative     Status: Abnormal   Collection Time: 10/11/17 11:11 AM  Result Value Ref Range Status   Specimen Description BRONCHIAL ALVEOLAR LAVAGE  Final   Special Requests NONE  Final   Gram Stain   Final    MODERATE WBC PRESENT,BOTH PMN AND MONONUCLEAR NO SQUAMOUS EPITHELIAL CELLS SEEN RARE BUDDING YEAST SEEN Performed at [facility], [place] [address] [place], [place] [address]    Culture 40,000 COLONIES/mL CANDIDA ALBICANS (A)  Final   Report Status 10/13/2017 FINAL  Final  Culture, respiratory (NON-Expectorated)     Status: None   Collection Time: 10/15/17  8:12 AM  Result Value Ref Range Status   Specimen Description TRACHEAL ASPIRATE  Final   Special Requests Normal  Final   Gram Stain   Final    FEW WBC PRESENT, PREDOMINANTLY PMN RARE GRAM NEGATIVE RODS Performed at [facility], [place] [address], [place], [place] [address]    Culture FEW CANDIDA ALBICANS  Final   Report Status  10/18/2017 FINAL  Final  Culture, blood (Routine X 2) w Reflex to ID Panel     Status: None   Collection Time: 10/15/17  8:52 AM  Result Value Ref Range Status   Specimen Description BLOOD RIGHT HAND  Final   Special Requests IN PEDIATRIC BOTTLE Blood Culture adequate volume  Final  Culture   Final    NO GROWTH 5 DAYS Performed at [facility], [place] [address], [place], [place] [address]    Report Status 10/20/2017 FINAL  Final  Culture, blood (Routine X 2) w Reflex to ID Panel     Status: None   Collection Time: 10/15/17 10:20 AM  Result Value Ref Range Status   Specimen Description BLOOD LEFT HAND  Final   Special Requests IN PEDIATRIC BOTTLE Blood Culture adequate volume  Final   Culture   Final    NO GROWTH 5 DAYS Performed at [facility], [place] [address], [place], [place] [address]    Report Status 10/20/2017 FINAL  Final  Culture, Urine     Status: None   Collection Time: 10/15/17 10:29 AM  Result Value Ref Range Status   Specimen Description URINE, CATHETERIZED  Final   Special Requests Normal  Final   Culture   Final    NO GROWTH Performed at [place] [address], [place], [place] [address]    Report Status 10/16/2017 FINAL  Final  Culture, respiratory (NON-Expectorated)     Status: None   Collection Time: 10/20/17  2:45 PM  Result Value Ref Range Status   Specimen Description TRACHEAL ASPIRATE  Final   Special Requests NONE  Final   Gram Stain   Final    MODERATE WBC PRESENT,BOTH PMN AND MONONUCLEAR RARE SQUAMOUS EPITHELIAL CELLS PRESENT NO ORGANISMS SEEN    Culture   Final    NO GROWTH 2 DAYS Performed at [facility], [place] [address], [place], [place] [address]    Report Status 10/22/2017 FINAL  Final  Culture, Urine     Status: None   Collection Time: 10/20/17  2:56 PM  Result Value Ref Range Status   Specimen Description URINE, CATHETERIZED  Final   Special Requests Normal  Final   Culture   Final    NO GROWTH Performed  at [facility], [place] [address], [place], [place] [address]    Report Status 10/21/2017 FINAL  Final  C difficile quick scan w PCR reflex     Status: None   Collection Time: 10/20/17  2:56 PM  Result Value Ref Range Status   C Diff antigen NEGATIVE NEGATIVE Final   C Diff toxin NEGATIVE NEGATIVE Final   C Diff interpretation No C. difficile detected.  Final    Comment: Performed at [facility], [place] [address], [place], [place] [address]  Culture, blood (routine x 2)     Status: None (Preliminary result)   Collection Time: 10/20/17  3:18 PM  Result Value Ref Range Status   Specimen Description BLOOD RIGHT HAND  Final   Special Requests IN PEDIATRIC BOTTLE Blood Culture adequate volume  Final   Culture   Final    NO GROWTH 2 DAYS Performed at [facility], [place] [address] [place], [place] [address]    Report Status PENDING  Incomplete  Culture, blood (routine x 2)     Status: None (Preliminary result)   Collection Time: 10/20/17  3:24 PM  Result Value Ref Range Status   Specimen Description BLOOD LEFT HAND  Final   Special Requests IN PEDIATRIC BOTTLE Blood Culture adequate volume  Final   Culture   Final    NO GROWTH 2 DAYS Performed at [facility], [place] [address], [place], [place] [address]    Report Status PENDING  Incomplete   Studies/Results: Dg Chest Port 1 View  Result Date: 10/23/2017 CLINICAL DATA:  Check endotracheal tube placement EXAM: PORTABLE CHEST 1 VIEW COMPARISON:  10/21/2017 FINDINGS: Cardiac shadow remains enlarged. Endotracheal tube and feeding catheter are again noted and stable. Right-sided thoracostomy catheter is noted in satisfactory position. No pneumothorax is noted. Bilateral pleural effusions are seen. No bony abnormality is noted. IMPRESSION: Stable bilateral pleural effusions. Tubes and lines as described. Electronically Signed   By: Inez Catalina M.D.   On: 10/23/2017 08:26   Assessment/Plan:  INTERVAL HISTORY:  - Febrile  overnight with Tmax of 101.30F - Chest tube with 250 mL out over the interval  - Cardiology and PCCM continuing to have goals of care discussion with the family  - Continues to maintain off norepinephrine but is still on milrinone (had to be increased over the interval) - Cultures continue to have no growth   ASSESSMENT: Bryan Harmon a 67 y.o.maleHFrEF and Atriat fibrillation who presented on 1/21in A-fib with RVR and low output heart failure. He has subsequently has had a complicated hospitalizationand persistent fevers since 1/30.Blood cultures from 2/10 with no growth to date. Likely source of his recurrent fevers is necrotic PNA and loculated right pleural effusion.   - Continue Metronidazole and Ceftraoline - Will attempt to get second opinion from either Dr. Megan Salon or Dr. Johnnye Sima   LOS: 23 days   [facility] 10/23/2017, 8:46 AM

## 2017-10-23 NOTE — Progress Notes (Signed)
ANTICOAGULATION CONSULT NOTE - Follow Up Consult  Pharmacy Consult:  Heparin Indication: atrial fibrillation and LA thrombus  Allergies  Allergen Reactions  . Fentanyl And Related Other (See Comments)    Patients eyes roll in the back of his head and patient has seizure like activity    Patient Measurements: Height: 5\' 10"  (177.8 cm) Weight: 227 lb (103 kg) IBW/kg (Calculated) : 73  Heparin dosing weight = 84 kg  Vital Signs: Temp: 101.3 F (38.5 C) (02/13 1430) Temp Source: Core (02/13 1000) BP: 87/73 (02/13 1000) Pulse Rate: 128 (02/13 1430)  Labs: Recent Labs    10/21/17 0354  10/21/17 1636  10/22/17 0342  10/22/17 1648 10/22/17 1817 10/23/17 0435 10/23/17 0438 10/23/17 1350  HGB 8.9*   < > 8.2*  --  8.8*  --  9.2*  --  7.7*  --  9.1*  HCT 28.1*   < > 24.0*  --  27.8*  --  27.0*  --  25.0*  --  29.5*  PLT 345  --   --   --  327  --   --   --  323  --   --   HEPARINUNFRC 0.39   < >  --    < >  --    < >  --  0.21*  --  0.12* 0.28*  CREATININE 3.05*   < > 3.10*  --  3.18*  --   --   --  3.35*  --   --    < > = values in this interval not displayed.    Estimated Creatinine Clearance: 26.4 mL/min (A) (by C-G formula based on SCr of 3.35 mg/dL (H)).  Assessment: Bryan Harmon known to pharmacy for heparin management with impella. Impella removed 10/16/17 due to stoppage from small vegetation that got sucked into the catheter. Heparin resumed ~ 4 hours after removal for afib and LA thrombus.   Heparin level now close to goal at 0.29, on 1700 units/hr. Hgb is 9.1 s/p 1 PRBCs, platelets remain WNL. Still some bloody output from chest tube. No infusion issues.   Goal of Therapy:  Heparin level 0.3 units/ml Monitor platelets by anticoagulation protocol: Yes   Plan:  Continue heparin gtt at 1700 units/hr Check next heparin level with AM labs Daily heparin level and CBC  Girard Cooter, PharmD Clinical Pharmacist  Pager: [phone] Clinical Phone for 10/23/2017 until  3:30pm: [phone] If after 3:30pm, please call main pharmacy at [phone]

## 2017-10-23 NOTE — Progress Notes (Addendum)
CT surgery p.m. Rounds  Right chest incision clean and dry staples intact Right chest tube drainage remains thin serosanguineous  and output slowly declining. chest drain to remain to water seal Last chest x-ray personally reviewed showing atelectasis-infiltrate.

## 2017-10-23 NOTE — Progress Notes (Signed)
ANTICOAGULATION CONSULT NOTE - Follow Up Consult  Pharmacy Consult for heparin Indication: atrial fibrillation and LA thrombus  Labs: Recent Labs    10/21/17 0354  10/21/17 1556 10/21/17 1636  10/22/17 0342 10/22/17 0930 10/22/17 1648 10/22/17 1817 10/23/17 0435 10/23/17 0438  HGB 8.9*  --  19.0* 8.2*  --  8.8*  --  9.2*  --  7.7*  --   HCT 28.1*  --  56.0* 24.0*  --  27.8*  --  27.0*  --  25.0*  --   PLT 345  --   --   --   --  327  --   --   --  323  --   HEPARINUNFRC 0.39   < >  --   --    < >  --  0.41  --  0.21*  --  0.12*  CREATININE 3.05*  --  3.00* 3.10*  --  3.18*  --   --   --   --   --    < > = values in this interval not displayed.    Assessment: 67yo male remains below tight goal on heparin after rate change.  Goal of Therapy:  Heparin level ~0.3 units/ml   Plan:  Will increase heparin gtt by 1-2 units/kg/hr to 1700 units/hr and check level in 8 hours.   Vernard Gambles, PharmD, BCPS  10/23/2017,5:15 AM

## 2017-10-23 NOTE — Progress Notes (Signed)
Patient ID: Bryan Harmon, male   DOB: Jan 28, 1951, 67 y.o.   MRN: [id number]     Advanced Heart Failure Rounding Note  Primary Cardiologist: Aundra Dubin   Subjective:    Impella 5.0 placed in the OR 1/30.  Chest tube placed on right 1/31.   2/4 ID consulted for ongoing fever. 2/4 Neuro consulted for ?seizure. CT of head with no acute findings. EEG without seizure activity.  Echo for Impella placement on 2/5 showed a small vegetation on a mitral valve chord.   2/5 CT of chest/abd/pelvis: Worsening pulmonary infiltrates.  Necrotizing PNA on right with complex pleural effusion. Also with aggressive left base PNA.    2/6 Impella removed due to blockage => suspect small vegetation on MV chord was drawn into the Impella, stopping the device.   Remains on amio 30 mg/hr, milrinone 0.375 mcg/kg/min. Off norepinephrine.   Intubated and sedated. Following commands.   Tm 101.3 on teflaro and flagyl.  Serosanguinous drainage from right chest tube.   Creatinine 2.2>2.5 >2.6>2.7>2.49 > 2.6 > 2.9 > 3.05 > 3.18 > 3.35  Hgb 7.7  CVP 14. Coox 54%.   Echo: Severe LV dilation, EF 15-20%, severely dilated RV with severely decreased RV systolic function, moderate-severe Bryan, moderate-severe TR, cannot rule out LV apical thrombus.  Repeat echo with Definity showed definite LV apical thrombus.   TEE (1/28): EF 10-15%, diffuse hypokinesis, small LV apical thrombus, mildly dilated RV with moderately decreased systolic function, no LAA thrombus.    Objective:   Weight Range: 227 lb (103 kg) Body mass index is 32.57 kg/m.   Vital Signs:   Temp:  [100.4 F (38 C)-101.3 F (38.5 C)] 100.9 F (38.3 C) (02/13 0815) Pulse Rate:  [28-141] 59 (02/13 0815) Resp:  [0-30] 5 (02/13 0815) BP: (74-113)/(54-79) 99/74 (02/13 0815) SpO2:  [90 %-99 %] 95 % (02/13 0815) Arterial Line BP: (84-117)/(52-75) 107/72 (02/13 0815) FiO2 (%):  [30 %-50 %] 50 % (02/13 0500) Weight:  [227 lb (103 kg)] 227 lb (103 kg)  (02/13 0500) Last BM Date: 10/22/17  Weight change: Filed Weights   10/21/17 0500 10/22/17 0256 10/23/17 0500  Weight: 217 lb (98.4 kg) 231 lb (104.8 kg) 227 lb (103 kg)    Intake/Output:   Intake/Output Summary (Last 24 hours) at 10/23/2017 0826 Last data filed at 10/23/2017 0800 Gross per 24 hour  Intake 4057.03 ml  Output 3285 ml  Net 772.03 ml      Physical Exam   CVP 14-15  General: Intubated/sedated.  HEENT: + ETT Neck: Supple. JVP ~8. Carotids 2+ bilat; no bruits. No thyromegaly or nodule noted. Cor: PMI nondisplaced. RRR, 2/6 HSM Apex. Soft 1-2+ edema to knees. 1+ BUE edema. Lungs: Diminished basilar sounds. Mechanical breathing sounds. Abdomen: Soft, non-tender, non-distended, no HSM. No bruits or masses. +BS  Extremities: No cyanosis, clubbing, or rash. Trace ankle edema.  Neuro: Intubated/sedated    Telemetry   A fib 120-130s, personally reviewed.   EKG    No new tracings.  Labs    CBC Recent Labs    10/22/17 0342 10/22/17 1648 10/23/17 0435  WBC 8.4  --  7.4  NEUTROABS 7.6  --  6.7  HGB 8.8* 9.2* 7.7*  HCT 27.8* 27.0* 25.0*  MCV 97.9  --  98.4  PLT 327  --  115   Basic Metabolic Panel Recent Labs    10/21/17 0354  10/22/17 0342 10/22/17 1648 10/23/17 0435  NA 145   < > 147* 150* 148*  K 4.0   < > 3.8 4.3 3.7  CL 113*   < > 116*  --  116*  CO2 21*  --  20*  --  20*  GLUCOSE 174*   < > 106* 143* 117*  BUN 94*   < > 98*  --  102*  CREATININE 3.05*   < > 3.18*  --  3.35*  CALCIUM 7.5*  --  7.5*  --  7.3*  MG 2.2  --  2.3  --  2.2  PHOS 5.2*  --   --   --  4.3   < > = values in this interval not displayed.   Liver Function Tests No results for input(s): AST, ALT, ALKPHOS, BILITOT, PROT, ALBUMIN in the last 72 hours. No results for input(s): LIPASE, AMYLASE in the last 72 hours. Cardiac Enzymes No results for input(s): CKTOTAL, CKMB, CKMBINDEX, TROPONINI in the last 72 hours.  BNP: BNP (last 3 results) Recent Labs     12/13/16 0949 02/11/17 1049 09/28/2017 1218  BNP 369.2* 562.7* 2,485.9*    ProBNP (last 3 results) No results for input(s): PROBNP in the last 8760 hours.   D-Dimer No results for input(s): DDIMER in the last 72 hours. Hemoglobin A1C No results for input(s): HGBA1C in the last 72 hours. Fasting Lipid Panel No results for input(s): CHOL, HDL, LDLCALC, TRIG, CHOLHDL, LDLDIRECT in the last 72 hours. Thyroid Function Tests No results for input(s): TSH, T4TOTAL, T3FREE, THYROIDAB in the last 72 hours.  Invalid input(s): FREET3  Other results:   Imaging    No results found.   Medications:     Scheduled Medications: . chlorhexidine gluconate (MEDLINE KIT)  15 mL Mouth Rinse BID  . Chlorhexidine Gluconate Cloth  6 each Topical Daily  . feeding supplement (PRO-STAT SUGAR FREE 64)  30 mL Per Tube TID  . Gerhardt's butt cream   Topical BID  . insulin aspart  2-6 Units Subcutaneous Q4H  . insulin glargine  12 Units Subcutaneous BID  . mouth rinse  15 mL Mouth Rinse 10 times per day  . sennosides  5 mL Oral Daily  . sodium chloride flush  3 mL Intravenous Q12H    Infusions: . sodium chloride 20 mL/hr at 10/15/17 0608  . amiodarone 30 mg/hr (10/23/17 0700)  . ceFTAROline (TEFLARO) IV Stopped (10/23/17 0105)  . dexmedetomidine (PRECEDEX) IV infusion for high rates 0.4 mcg/kg/hr (10/23/17 0756)  . famotidine (PEPCID) IV Stopped (10/22/17 0949)  . feeding supplement (VITAL 1.5 CAL) 1,000 mL (10/22/17 2156)  . furosemide (LASIX) infusion 8 mg/hr (10/23/17 0700)  . heparin 1,700 Units/hr (10/23/17 0800)  . metronidazole Stopped (10/23/17 0536)  . milrinone 0.375 mcg/kg/min (10/23/17 0700)  . norepinephrine (LEVOPHED) Adult infusion Stopped (10/23/17 0824)    PRN Medications: sodium chloride, acetaminophen (TYLENOL) oral liquid 160 mg/5 mL, albuterol, magic mouthwash, midazolam, morphine injection, ondansetron (ZOFRAN) IV, sodium chloride flush, sodium chloride  flush    Patient Profile  Bryan Harmon is a 67 year old with a history of PAF S/P DC-CV 11/2016, s/p bilateral inguinal hernia repair 11/2017, PVCs, HTN, NICM, chronic systolic heart failure.   Sent from Urgent Care with A fib RVR. Acutely SOB on arrival   Assessment/Plan   1. Acute hypoxemic respiratory failure: RLL PNA, Strep pneumoniae in blood cultures. Respiratory cultures with parainfluenzae virus. Also acute/chronic systolic CHF.Now intubated s/p Impella 5.0 placement, multifocal PNA on CXR and CT chest. Remains critically ill.  Antibiotics broadened from ceftriaxone to vancomycin/meropenem on 1/30.  BAL with Candida albicans, anidulafungin started 2/2.  Necrotizing PNA with complex pleural effusion on right on 2/5 CT, anti-microbials changed to ceftaroline and Flagyl on 2/5. Has had PSV trials daily.   - Right chest tube placed 1/31, still with serosanguineous drainage. - ID concerned that necrosis in right lung + loculated effusion may require surgery to quell infection.  However, He is too unstable for this.  2.Acute on chronic systolic CHF-> cardiogenic shock : Nonischemic cardiomyopathy. 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 Etiology of his CHF is not clear =>no definite inciting event. He has a history of HTN but doubt this was the only trigger. 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 This would ideally be confirmed by cMRI, but he has not wanted an MRI (concerned about side effects). He also has frequent PVCs, 21% total on last holter in 4/18 which is a risk for fall in EF.No family history of CMP. Cannot rule out viral myocarditis. SPEP negative. With medical management, he initially felt a lot better. However, he quit all his meds in early 2018 with recurrence of NYHA III symptoms and onset of atrial fibrillation.He had TEE-DCCV in 3/18.TEE showed that EF remained25%. He quit his meds  again in 4/18 and apparently did not restart them when I asked him to in 6/18. No meds probably since 4/18. Echo this admission with EF 15-20%, severe RV dysfunction, LV apical thrombus. RV is hypokinetic.  Attempted DCCV on 1/28 failed likely due to pressors/inotropes.  [place] 1/29 with low cardiac output => Impella 5.0 placed 1/30. On 2/6, Impella removed due to pump stop (suspect vegetation from MV chord sucked into Impella). Suspect combination of septic/vasogenic shock and cardiogenic shock.  Luiz Blare out on 2/10.   Coox 54% on milrinone 0.375 mcg/kg/min, norepinephrine off.   - Creatinine up again to 3.35 but CVP 14  - Increase lasix gtt to 12 mg/hr.  - Increase milrinone to 0.5 with co-ox 54.8%.  - End point still difficult to envision => would be poor LVAD candidate with renal failure and RV failure.   3. Atrial fibrillation:Persistent, now with RVR. HR up to 140sinitially, now 110s on amiodarone gtt at30. Not sure how long he has been in atrial fibrillation with RVR, but this may have triggered his CHF and AKI due to worsening of cardiac output.  -Heparin gtt ongoing. - Continue amiodarone gtt at 30 mg/hr.   - Failed TEE-guided DCCV on 1/28 in setting of vasoactive meds. He is now off norepinephrine but having to increase milrinone.  Will likely attempt TEE-guided DCCV one more time this week if he stays stable off pressor.   4. AKI: Creatinine 1.08 in 6/18, he has not been on any meds. Suspect cardiorenal syndrome with afib/RVR and fall in cardiac output.He was initially hyperkalemic and acidotic.Today, BUN/creatinine continue to rise but CVP also up.  - Support CO by increasing milrinone.  - Increase Lasix gtt as above.  - Worsening renal function despite support is concerning, he is not HD candidate.  5. [place]:PXTGG pneumo PNA as well as parainfluenza virus.CT chest 1/29 with multifocal PNA. On  1/30 to vancomycin/meropenem, then anidulafungin added.  ID has seen. Now on Teflaro and  metronidazole. CT of chest/abd/pelvis 2/5 concerning for necrotizing right pneumonia with surrounding complex large pleural effusion.  He has a chest tube with ongoing serosanguineous drainage. 2/5 showed a small vegetation on a mitral valve chord.  Repeat ECHO on 2/6 did not show  vegetation. Assumed it was pulled into Impella.  He is now off [employer] but febrile to 101 again last night. - Continue current antibiotics, so far additional culture negative.   - WBCs not elevated but remains febrile with Tmax 101.3.  - Necrosis in right lung + loculated effusion may ideally require surgery to effectively treat infection, however do not think he would survive surgery at this point.  Would moving chest tube be of any help?   6. Elevated LFTs: Suspect shock liver type picture. - AST/ALT > 1000 at admission but have trended down steadily. No change 7. LV thrombus: Noted on TTE and TEE, small.  - Remains on heparin.  No change.  8. Hypernatremia: Mild, Na 148 today.  9. Malnutrition:  - Tube feeds on going.  10. Anemia: Significant blood loss via chest tube and mouth.   Received 2UPRBCs 2/3, 2/4 5 uPRBCs, 2/5 2UPRBCs. 2/6 2U PRBCs. 1 unit PRBCs 2/9.   - Hgb down to 7.7 from 9.2.  Will give 1 unit PRBCs.  11. Suspected Deep Tissue Injury -->Sacrum/R buttock/Mid back . Continue to repostion R/L. WOC consult appreciated. 12. Seizure- Neurology appreciated. CT head negative. EEG no seizure noted. No change.   Patient is critically ill and remains in multisystem organ failure.  On-going discussions about code status. Family with very poor insight into prognosis despite multiple and continuing conversations. Shirley Friar, PA-C 10/23/2017 8:26 AM  Advanced Heart Failure Team Pager [phone] (M-F; 7a - 4p)  Please contact [facility] for night-coverage after hours (4p -7a ) and weekends on [email or web address]  Patient seen with PA, agree with the above note.  He remains febrile to 101.  Co-ox 54%,  CVP 14, rising BUN/creatinine.  Hgb down to 7.7. He remains in afib, rate around 120.   On exam, he is intubated but awakens and follows commands.  JVP 12+ cm.  1+ edema to knees bilaterally and of forearms.  Irregular S1S2.  Decreased breath sounds at bases.   Clinical situation slowly worsens.  He is developing progressive renal dysfunction but also developing worsening pulmonary edema with increased CVP and worse CXR.  He remains febrile.   - Increase Milrinone to 0.5 mcg/kg/min today with co-ox 54%, hopefully promote renal perfusion with better CO, BP is stable.  - Increase Lasix gtt to 12 mg/hr with volume overload.    Suspect necrotic infection in right lung base will not heal without surgery (see ID and pulmonary notes).  He is not a surgical candidate.  Worsening renal function, no HD candidate.  I do not think we have a good way forwards here.  Dr. Nelda Marseille and I talked for another 30 minutes or so with patient's wife and son.  We recommended extubation with comfort measures.  They are thinking about this.   CRITICAL CARE Performed by: Loralie Champagne  Total critical care time: 45 minutes  Critical care time was exclusive of separately billable procedures and treating other patients.  Critical care was necessary to treat or prevent imminent or life-threatening deterioration.  Critical care was time spent personally by me on the following activities: development of treatment plan with patient and/or surrogate as well as nursing, discussions with consultants, evaluation of patient's response to treatment, examination of patient, obtaining history from patient or surrogate, ordering and performing treatments and interventions, ordering and review of laboratory studies, ordering and review of radiographic studies, pulse oximetry and re-evaluation of patient's condition.  Loralie Champagne 10/23/2017 12:54 PM

## 2017-10-23 NOTE — Progress Notes (Signed)
Patient ID: Bryan Harmon, male   DOB: 12-30-1950, 68 y.o.   MRN: [id number]          [facility]  Date of Admission:  10/10/2017   Total days of antibiotics 24        Day 9 ceftaroline        Day 9 metronidazole         ASSESSMENT: At his wife and son's request I was asked to do a second opinion consult within our infectious disease group.  He was admitted 3 and half weeks ago with parainfluenza infection and pneumococcal pneumonia with transient bacteremia.  He received 9 days of ceftriaxone.  He began having fever after LVAD placement and his antibiotics were broadened to vancomycin and meropenem.  Anidulafungin was added for 3 days.  He defervesced transiently only to start fever again at which point his antibiotics were changed to his current regimen.  Recent fevers have been up to 101.1 degrees.  All recent cultures have been negative.  Recent CT scan and chest x-rays show some necrotic right lung and loculated right pleural effusion.  He has multiorgan dysfunction with severe heart failure and worsening renal function.  I spent about 30 minutes discussing this with his wife and his son.  I told them that the pneumococcal infection has been treated adequately but that does not mean that the damage to his lung will ever recover.  I told them that we have not identified any other significant infection to target with our therapies.  Given that he continues to have fever after 3-1/2 weeks of broad antibiotic therapy I suggested stopping them.  The potential benefits would be to try to determine if he has any component of drug fever.  This will also help remove ongoing risk for any severe side effects such as C. difficile colitis.  I also told them that antibiotics could potentially be masking infection and reducing the yield of cultures.  I attempted to give them plenty of time to ask questions.  The son asked her on 2 separate occasions if she was in agreement with  stopping antibiotics and both times she said yes if that is what I recommend.  Therefore I will go ahead and stop antibiotics now.  We will continue to follow with you.  PLAN: 1. Discontinue ceftaroline and metronidazole and observe off of antibiotics for now  Principal Problem:   FUO (fever of unknown origin) Active Problems:   HCAP (healthcare-associated pneumonia)   Pneumococcal bacteremia   Atrial fibrillation with RVR (HCC)   Acute on chronic systolic heart failure (HCC)   Encounter for central line care   Shock circulatory (HCC)   Malnutrition of moderate degree   Cardiogenic shock (HCC)   LVAD (left ventricular assist device) present (HCC)   Pressure injury of skin   AKI (acute kidney injury) ([place])   Central line infiltration (HCC)   CHF (congestive heart failure) ([place])   Community acquired pneumonia of right lower lobe of lung ([place])   Hemothorax on right   Hx of chest tube placement   Focal seizure (HCC)   Unresponsiveness   Seizure ([place])   Acute bacterial endocarditis   Empyema (HCC)   Acute respiratory failure ([place])   Palliative care encounter   Goals of care, counseling/discussion   Scheduled Meds: . chlorhexidine gluconate (MEDLINE KIT)  15 mL Mouth Rinse BID  . Chlorhexidine Gluconate Cloth  6 each Topical Daily  . feeding supplement (PRO-STAT SUGAR  FREE 64)  30 mL Per Tube BID  . Gerhardt's butt cream   Topical BID  . insulin aspart  2-6 Units Subcutaneous Q4H  . insulin glargine  12 Units Subcutaneous BID  . mouth rinse  15 mL Mouth Rinse 10 times per day  . sennosides  5 mL Oral Daily  . sodium chloride flush  3 mL Intravenous Q12H   Continuous Infusions: . sodium chloride 20 mL/hr at 10/15/17 0608  . amiodarone 30 mg/hr (10/23/17 1503)  . dexmedetomidine (PRECEDEX) IV infusion for high rates 0.921 mcg/kg/hr (10/23/17 1155)  . famotidine (PEPCID) IV Stopped (10/23/17 1304)  . feeding supplement (VITAL 1.5 CAL) 1,000 mL (10/22/17 2156)  . furosemide  (LASIX) infusion 12 mg/hr (10/23/17 1303)  . heparin 1,700 Units/hr (10/23/17 1000)  . milrinone 0.5 mcg/kg/min (10/23/17 1442)  . norepinephrine (LEVOPHED) Adult infusion 8 mcg/min (10/23/17 1503)   PRN Meds:.sodium chloride, acetaminophen (TYLENOL) oral liquid 160 mg/5 mL, albuterol, magic mouthwash, midazolam, morphine injection, ondansetron (ZOFRAN) IV, sodium chloride flush, sodium chloride flush  Review of Systems: Review of Systems  Unable to perform ROS: Intubated    Allergies  Allergen Reactions  . Fentanyl And Related Other (See Comments)    Patients eyes roll in the back of his head and patient has seizure like activity    OBJECTIVE: Vitals:   10/23/17 1345 10/23/17 1400 10/23/17 1415 10/23/17 1430  BP:      Pulse: (!) 32 (!) 118 (!) 128 (!) 128  Resp: (!) 30 (!) 21 (!) 22 (!) 22  Temp: (!) 101.3 F (38.5 C) (!) 101.3 F (38.5 C) (!) 101.3 F (38.5 C) (!) 101.3 F (38.5 C)  TempSrc:      SpO2: 95% 95% 95% 96%  Weight:      Height:       Body mass index is 32.57 kg/m.  Physical Exam  Constitutional:  He remains intubated.  He has a left subclavian central line, a right femoral A-line and a left arm peripheral IV.  He is alert and will follow some commands.  Cardiovascular: Regular rhythm.  No murmur heard. He is tachycardic.  Pulmonary/Chest:  Coarse airway noise.  Small amounts of tan sputum being suctioned.  He has a right-sided chest tube.  There is a clean dry gauze dressing over his right anterior chest surgical site.  Abdominal: Soft. He exhibits no distension. There is no tenderness.  Soft brown stool in Flexi-Seal bag.  Musculoskeletal:  He has Unna boot wraps on both lower extremities.  No significant pressure sores have been documented.  Neurological: He is alert.  Skin: No rash noted.    Lab Results Lab Results  Component Value Date   WBC 7.4 10/23/2017   HGB 9.1 (L) 10/23/2017   HCT 29.5 (L) 10/23/2017   MCV 98.4 10/23/2017   PLT 323  10/23/2017    Lab Results  Component Value Date   CREATININE 3.35 (H) 10/23/2017   BUN 102 (H) 10/23/2017   NA 148 (H) 10/23/2017   K 3.7 10/23/2017   CL 116 (H) 10/23/2017   CO2 20 (L) 10/23/2017    Lab Results  Component Value Date   ALT 32 10/23/2017   AST 35 10/23/2017   ALKPHOS 85 10/23/2017   BILITOT 1.0 10/23/2017     Microbiology: Recent Results (from the past 240 hour(s))  Culture, respiratory (NON-Expectorated)     Status: None   Collection Time: 10/15/17  8:12 AM  Result Value Ref Range Status  Specimen Description TRACHEAL ASPIRATE  Final   Special Requests Normal  Final   Gram Stain   Final    FEW WBC PRESENT, PREDOMINANTLY PMN RARE GRAM NEGATIVE RODS Performed at [facility], [place] [address], [place], [place] [address]    Culture FEW CANDIDA ALBICANS  Final   Report Status 10/18/2017 FINAL  Final  Culture, blood (Routine X 2) w Reflex to ID Panel     Status: None   Collection Time: 10/15/17  8:52 AM  Result Value Ref Range Status   Specimen Description BLOOD RIGHT HAND  Final   Special Requests IN PEDIATRIC BOTTLE Blood Culture adequate volume  Final   Culture   Final    NO GROWTH 5 DAYS Performed at [facility], [place] [address] [place], [place] [address]    Report Status 10/20/2017 FINAL  Final  Culture, blood (Routine X 2) w Reflex to ID Panel     Status: None   Collection Time: 10/15/17 10:20 AM  Result Value Ref Range Status   Specimen Description BLOOD LEFT HAND  Final   Special Requests IN PEDIATRIC BOTTLE Blood Culture adequate volume  Final   Culture   Final    NO GROWTH 5 DAYS Performed at [facility], [place] [address], [place], [place] [address]    Report Status 10/20/2017 FINAL  Final  Culture, Urine     Status: None   Collection Time: 10/15/17 10:29 AM  Result Value Ref Range Status   Specimen Description URINE, CATHETERIZED  Final   Special Requests Normal  Final   Culture   Final    NO GROWTH Performed at [place] [address], [place], [place] [address]    Report Status 10/16/2017 FINAL  Final  Culture, respiratory (NON-Expectorated)     Status: None   Collection Time: 10/20/17  2:45 PM  Result Value Ref Range Status   Specimen Description TRACHEAL ASPIRATE  Final   Special Requests NONE  Final   Gram Stain   Final    MODERATE WBC PRESENT,BOTH PMN AND MONONUCLEAR RARE SQUAMOUS EPITHELIAL CELLS PRESENT NO ORGANISMS SEEN    Culture   Final    NO GROWTH 2 DAYS Performed at [facility], [place] [address], [place], [place] [address]    Report Status 10/22/2017 FINAL  Final  Culture, Urine     Status: None   Collection Time: 10/20/17  2:56 PM  Result Value Ref Range Status   Specimen Description URINE, CATHETERIZED  Final   Special Requests Normal  Final   Culture   Final    NO GROWTH Performed at [facility], [place] [address], [place], [place] [address]    Report Status 10/21/2017 FINAL  Final  C difficile quick scan w PCR reflex     Status: None   Collection Time: 10/20/17  2:56 PM  Result Value Ref Range Status   C Diff antigen NEGATIVE NEGATIVE Final   C Diff toxin NEGATIVE NEGATIVE Final   C Diff interpretation No C. difficile detected.  Final    Comment: Performed at [facility], [place] [address] [place],  [address]  Culture, blood (routine x 2)     Status: None (Preliminary result)   Collection Time: 10/20/17  3:18 PM  Result Value Ref Range Status   Specimen Description BLOOD RIGHT HAND  Final   Special Requests IN PEDIATRIC BOTTLE Blood Culture adequate volume  Final  Culture   Final    NO GROWTH 3 DAYS Performed at [facility], [place] [address], [place], [place] [address]    Report Status PENDING  Incomplete  Culture, blood (routine x 2)     Status: None (Preliminary result)   Collection Time: 10/20/17  3:24 PM  Result Value Ref Range Status   Specimen Description BLOOD LEFT HAND  Final   Special Requests IN PEDIATRIC BOTTLE  Blood Culture adequate volume  Final   Culture   Final    NO GROWTH 3 DAYS Performed at [facility], [place] [address] [place], [place] [address]    Report Status PENDING  Incomplete    Michel Bickers, MD [facility] [phone] pager   [phone] cell 10/23/2017, 3:47 PM

## 2017-10-23 NOTE — Progress Notes (Signed)
PULMONARY / CRITICAL CARE MEDICINE   Name: Bryan Harmon MRN: [id number] DOB: 07-26-1951    ADMISSION DATE:  09/22/2017  REFERRING MD:  Nils Pyle  CHIEF COMPLAINT:  Fatigue  HISTORY OF PRESENT ILLNESS:   67 y/o male with non-ischemic cardiomyopathy and afib admitted with decompensated heart failure.  Required Impella, vent.    Lines/ Tubes 1/30 L IJ CVL > 1/31 1/30 R IJ swan > 1/30 R sub clav impella > 10/16/2017 1/30 L radial arterial line > 10/15/2017 1/30 ETT >  1/31 L Johnson TLC >  10/15/2017 right femoral arterial line placed>>  Cultures: 1/21 Blood >> Strep Pneumoniae >> pan sens 1/21 Resp Viral Panel >> + Parainfluenza Virus 3 1/28 blood > ng 1/30 blood > ng 1/31 resp culture > ng 2/1 BAL > candida 10/15/2017 blood cultures x2>> 10/15/2017 tracheal aspirate>> rare gram-negative rods few Candida albicans 10/15/2017 urine culture>> negative  ABX: Infectious disease consult 10/14/2017 Rocephin 1/21 >> 1/29 Doxy 1/21 >> 1/23 Vanc 1/30 >> 10/14/2017 Mero 1/30 >> 10/15/2017 Eraxis 2/2 >> off Teflaro 10/15/2017>> Flagyl 10/15/2017>>  Studies: 10/01/2017>> Echo EF 15-20%, Apex ? PAP muscle vs. Thrombus, LV severely dilated, moderate concentric hypertrophy, systolic function normal, unable to evaluate LV diastolic function due to atrial fibrillation. + moderate spontaneous echo contrast, indicative of stasis Impression 1/23 Echo > Severely dilated LV with severe LV dysfunction globally with EF 15-20%. Severely dialted RV with severe RV dysfunction. Moderate to severe MR with ERO 0.33cm2 and MR volume 73m. Moderate tosevere TR with moderate pulmonary HTN. Moderately thickened and calcified AV leaflets with mild MR, mildly dilated aortic root, massive biatrial enlargement. Cannot rule out LV thrombus. Thereis significant spontaneous echo contrast in LV c/w sluggish blood flow. The right ventricular systolic pressure was increasedconsistent with moderate pulmonary hypertension. 1/29 CT  chest > dense bilateral lower lobe consolidation R>L 1/30 TEE >> LVEF 20-25%, impella in place, poor overall contractility but LV improved  SUBJECTIVE:   No events overnight, remains unresponsive and weaning with high MV  VITAL SIGNS: BP (!) 87/73 (BP Location: Other (Comment)) Comment (BP Location): Arterial line  Pulse (!) 124   Temp (!) 100.9 F (38.3 C)   Resp (!) 21   Ht _0  (1.778 m)   Wt 103 kg (227 lb)   SpO2 95%   BMI 32.57 kg/m   HEMODYNAMICS: CVP:  [10 mmHg-14 mmHg] 14 mmHg  VENTILATOR SETTINGS: Vent Mode: PCV FiO2 (%):  [30 %-50 %] 40 % Set Rate:  [10 bmp] 10 bmp PEEP:  [5 cmH20] 5 cmH20 Pressure Support:  [10 cmH20] 10 cmH20 Plateau Pressure:  [15 cmH20] 15 cmH20  INTAKE / OUTPUT: I/O last 3 completed shifts: In: 5905.4 [I.V.:2235.4; NG/GT:2270; IV Piggyback:1400] Out: 42585[Urine:3440; Stool:850; Chest Tube:440]  PHYSICAL EXAMINATION: General: Frail ill-appearing male, unresponsive very high WOB HEENT: ETT in place, Colman/AT, PERRL, EOM-spontaneous Neuro: Unresponsive but moving all ext spontaneously CV: RRR, Nl S1/S2 and -M/R/G. PULM: Diffuse crackles in all lung fields GI: Soft, NT, ND and +BS Extremities: warm/dry, decreased edema  Skin: Skin breakdown noted at multiple pressure points.  LABS:  BMET Recent Labs  Lab 10/21/17 0354  10/21/17 1636 10/22/17 0342 10/22/17 1648 10/23/17 0435  NA 145   < > 146* 147* 150* 148*  K 4.0   < > 3.7 3.8 4.3 3.7  CL 113*   < > 112* 116*  --  116*  CO2 21*  --   --  20*  --  20*  BUN 94*   < > 81* 98*  --  102*  CREATININE 3.05*   < > 3.10* 3.18*  --  3.35*  GLUCOSE 174*   < > 116* 106* 143* 117*   < > = values in this interval not displayed.   Electrolytes Recent Labs  Lab 10/20/17 0230 10/21/17 0354 10/22/17 0342 10/23/17 0435  CALCIUM 7.3* 7.5* 7.5* 7.3*  MG 2.3 2.2 2.3 2.2  PHOS 4.5 5.2*  --  4.3   CBC Recent Labs  Lab 10/21/17 0354  10/22/17 0342 10/22/17 1648 10/23/17 0435  WBC  9.1  --  8.4  --  7.4  HGB 8.9*   < > 8.8* 9.2* 7.7*  HCT 28.1*   < > 27.8* 27.0* 25.0*  PLT 345  --  327  --  323   < > = values in this interval not displayed.    Coag's Recent Labs  Lab 10/17/17 0454 10/18/17 0400  APTT 72* 98*    Sepsis Markers No results for input(s): LATICACIDVEN, PROCALCITON, O2SATVEN in the last 168 hours.  ABG Recent Labs  Lab 10/21/17 1645 10/22/17 0424 10/23/17 0419  PHART 7.444 7.325* 7.419  PCO2ART 29.4* 39.6 29.8*  PO2ART 83.0 72.0* 81.0*    Liver Enzymes Recent Labs  Lab 10/23/17 0913  AST 35  ALT 32  ALKPHOS 85  BILITOT 1.0  ALBUMIN 1.5*    Cardiac Enzymes No results for input(s): TROPONINI, PROBNP in the last 168 hours.  Glucose Recent Labs  Lab 10/22/17 1354 10/22/17 2013 10/22/17 2356 10/23/17 0403 10/23/17 0925 10/23/17 1206  GLUCAP 141* 122* 110* 115* 119* 111*    Imaging Dg Chest Port 1 View  Result Date: 10/23/2017 CLINICAL DATA:  Check endotracheal tube placement EXAM: PORTABLE CHEST 1 VIEW COMPARISON:  10/21/2017 FINDINGS: Cardiac shadow remains enlarged. Endotracheal tube and feeding catheter are again noted and stable. Right-sided thoracostomy catheter is noted in satisfactory position. No pneumothorax is noted. Bilateral pleural effusions are seen. No bony abnormality is noted. IMPRESSION: Stable bilateral pleural effusions. Tubes and lines as described. Electronically Signed   By: Inez Catalina M.D.   On: 10/23/2017 08:26   DISCUSSION: 67 y/o male with ischemic cardiomyopathy admitted with decompensated systolic heart failure in the setting of strep pneumo pneumonia and bacteremia s/p impella.  He has acute on chronic kidney failure, cardiogenic shock.  10/15/2017 with fever 105.6, pancultured 01/2018, ID consult 10/14/2017. 's overall cardiac function has declined despite all interventions.  Family wants to continue with maximum interventions.  10/18/2016 CODE STATUS changed to a limited with no chest compressions  but all other interventions will be continued.  Despite his poor prognosis he is continued to improve therefore possibility of tracheostomy may be undertaken within 72 hours. 10/19/2017 no significant change in status.  I do note there is some having been placed on pressure regulated volume control mode of ventilation which was returned to written orders for pressure control ventilation on 10/19/2017. His hemoglobin is drifting down he may be ready for follow-up transfusion 10/20/2017 continues to slowly improve.  Massive anasarca has improved despite being a positive I&O.  His Levophed is weaning.  Plan for tracheostomy in the next 24-72 hours  ASSESSMENT / PLAN:  PULMONARY A: Acute respiratory failure with hypoxemia Pneumococcal CAP - RLL  Acute pulmonary edema Bilateral pleural effusions , R >> L P:   PS as tolerated, no extubation unless 1 way extubation Not a candidate for tracheostomy as BUN is very elevated that will  result in bleeding and patient will not improve from this thus tracheostomy is a futile procedure which will not be performed. Titrate O2 for sat of 88-92% May extubate to BiPAP per family's request but concern is airway protection  CARDIOVASCULAR A:  Acute decompensated cardiomyopathy > severe biventricular failure, LVEF ~ 5% S/p Impella 1/30 with removal on 10/16/2017 after failure most likely from vegetation been ingested by the Impella intake. Cardiogenic Shock.  Multiple vasopressor dependent Medical Non-Compliance - stopped taking all meds prior to admit for CHF, ? Long term plan 10/11/2017 right chest tube P:  Volume management, inotrope management as per advanced heart failure team plans, TCTS plans.   Lasix restarted due to volume status but renal function continues to deteriorate Ct levophed which is being weaned also./ milrinone gtt Amiodarone drip, if stabilizes consider cardioversion in the future. Prognosis is very poor but family is not quite understanding how  ill the patient is and that his chances of survival are very poor CT to water seal  RENAL Lab Results  Component Value Date   CREATININE 3.35 (H) 10/23/2017   CREATININE 3.18 (H) 10/22/2017   CREATININE 3.10 (H) 10/21/2017   CREATININE 1.11 03/25/2012   Recent Labs  Lab 10/22/17 0342 10/22/17 1648 10/23/17 0435  K 3.8 4.3 3.7   Recent Labs  Lab 10/22/17 0342 10/22/17 1648 10/23/17 0435  NA 147* 150* 148*    Intake/Output Summary (Last 24 hours) at 10/23/2017 1254 Last data filed at 10/23/2017 1220 Gross per 24 hour  Intake 4129.87 ml  Output 3345 ml  Net 784.87 ml   A:   Deteriorating renal failure  Hypokalemia/hypervolemic P:   Continue to follow urine output, BMP Replace electrolytes as indicated Avoid nephrotoxic agents, ensure adequate renal perfusion Lasix per heart failure team Doubt is a dialysis candidate given dismal prognosis but will let nephrology be the judge of that BMET in AM  GASTROINTESTINAL A:   No acute issues P:   Tube feeding per nutrition Pepcid IV for ulcer prophylaxis  HEMATOLOGIC Recent Labs    10/22/17 1648 10/23/17 0435  HGB 9.2* 7.7*   Lab Results  Component Value Date   INR 1.33 09/28/2017   INR 1.10 07/05/2016   INR 0.98 10/08/2012   A:   Anemia, thrombocytopenia - blood loss via chest tube Concern for hemolytic anemia with impella, note high LDH P:  PRBCs per CHF service, goal Hb 8 & above Transfused 4 units of packed cells 10/14/2017 Follow CBC and transfusion per cardiology service with 2 units of packed cells on 10/15/2017 Remains on heparin Right chest tube intermittently dumps up to 150 cc of bloody drainage, to water seal at this point, will not remove as long as needing positive pressure ventilation which is likely for the foreseeable future  INFECTIOUS A:   Strep Pneumonia Bacteremia - 1/21 - completed 10 ds of rocephin Severe CAP HCAP? >  No evidence thus far based on BAL from 2/1 Fever 1/30 - new,  considering RLL, sinusitis with NGT, multiple lines Parainfluenza virus pneumonia Persistent right pleural effusion appears complex and could be empyema despite right chest tube 10/19/2017 T-max 101 during the night.  He has been followed by infectious disease no cultures are positive at this time. P:   Vancomycin discontinued on 10/14/2017 Infectious disease consult 10/14/2017 appreciated Currently on Teflaro and Flagyl per ID both  started on 10/15/2017 Suspect that yeast in BAL is a contaminant, empiric   ENDOCRINE CBG (last 3)  Recent Labs  10/23/17 0403 10/23/17 0925 10/23/17 1206  GLUCAP 115* 119* 111*   A:   Hyperglycemia well controlled P:   Sliding scale insulin per protocol  NEUROLOGIC A:   Sedation Need / Mechanical Ventilation  Suspected seizure 10/14/2017 neurology consult Suspect he may be secondary to febrile state. EEG performed P:   RASS goal: -1  Sedation protocol: -1> continue precedex at lower dose, PRN fentanyl, versed  EEG with no seizure activity noted.  Neurology has signed off as of 10/16/2017 10/20/2017 awake and follows commands no further seizure activity.  Much more awake  FAMILY  - Updates: Extensive, lengthy and multiple conversations with son and wife with Dr. Aundra Dubin present then after.  They were informed that patient is not a tracheostomy candidate and why.  They would like to extubate him but are still hesitant about reintubation even knowing that he is not a tracheostomy candidate.  I am becoming increasingly concerned with their ability to see the whole picture in a patient that is clearly dying.  Discussed with Dr. Aundra Dubin will call nephrology.  Family requested Dr. Johnnye Sima to see patient and ID resident informed of their request.  Will continue to follow up on plan of care.  Will likely not be a dialysis candidate and will expire from that.  Very concerning situation.  The patient is critically ill with multiple organ systems failure and requires high  complexity decision making for assessment and support, frequent evaluation and titration of therapies, application of advanced monitoring technologies and extensive interpretation of multiple databases.   Critical Care Time devoted to patient care services described in this note is  95  Minutes. This time reflects time of care of this signee Dr Jennet Maduro. This critical care time does not reflect procedure time, or teaching time or supervisory time of PA/NP/Med student/Med Resident etc but could involve care discussion time.  Rush Farmer, M.D. [facility] Pulmonary/Critical Care Medicine. Pager: [phone]. After hours pager: [phone].

## 2017-10-24 ENCOUNTER — Inpatient Hospital Stay (HOSPITAL_COMMUNITY): Payer: Medicare Other

## 2017-10-24 DIAGNOSIS — M109 Gout, unspecified: Secondary | ICD-10-CM

## 2017-10-24 DIAGNOSIS — I340 Nonrheumatic mitral (valve) insufficiency: Secondary | ICD-10-CM

## 2017-10-24 DIAGNOSIS — J942 Hemothorax: Secondary | ICD-10-CM

## 2017-10-24 DIAGNOSIS — R601 Generalized edema: Secondary | ICD-10-CM

## 2017-10-24 LAB — ECHOCARDIOGRAM LIMITED
HEIGHTINCHES: 70 in
Weight: 3600 oz

## 2017-10-24 LAB — POCT I-STAT 3, ART BLOOD GAS (G3+)
Acid-base deficit: 4 mmol/L — ABNORMAL HIGH (ref 0.0–2.0)
Acid-base deficit: 2 mmol/L (ref 0.0–2.0)
BICARBONATE: 22.3 mmol/L (ref 20.0–28.0)
BICARBONATE: 21.9 mmol/L (ref 20.0–28.0)
O2 SAT: 94 %
O2 SAT: 96 %
PCO2 ART: 48.9 mmHg — AB (ref 32.0–48.0)
PCO2 ART: 34.3 mmHg (ref 32.0–48.0)
Patient temperature: 38.3
TCO2: 24 mmol/L (ref 22–32)
TCO2: 23 mmol/L (ref 22–32)
pH, Arterial: 7.274 — ABNORMAL LOW (ref 7.350–7.450)
pH, Arterial: 7.418 (ref 7.350–7.450)
pO2, Arterial: 89 mmHg (ref 83.0–108.0)
pO2, Arterial: 84 mmHg (ref 83.0–108.0)

## 2017-10-24 LAB — CALCIUM, IONIZED: Calcium, Ionized, Serum: 3.8 mg/dL — ABNORMAL LOW (ref 4.5–5.6)

## 2017-10-24 LAB — BASIC METABOLIC PANEL
Anion gap: 10 (ref 5–15)
BUN: 107 mg/dL — AB (ref 6–20)
CHLORIDE: 116 mmol/L — AB (ref 101–111)
CO2: 23 mmol/L (ref 22–32)
CREATININE: 3.55 mg/dL — AB (ref 0.61–1.24)
Calcium: 7.4 mg/dL — ABNORMAL LOW (ref 8.9–10.3)
GFR calc Af Amer: 19 mL/min — ABNORMAL LOW (ref >60)
GFR calc non Af Amer: 17 mL/min — ABNORMAL LOW (ref >60)
GLUCOSE: 124 mg/dL — AB (ref 65–99)
POTASSIUM: 3.6 mmol/L (ref 3.5–5.1)
SODIUM: 149 mmol/L — AB (ref 135–145)

## 2017-10-24 LAB — GLUCOSE, CAPILLARY
Glucose-Capillary: 117 mg/dL — ABNORMAL HIGH (ref 65–99)
Glucose-Capillary: 100 mg/dL — ABNORMAL HIGH (ref 65–99)
Glucose-Capillary: 125 mg/dL — ABNORMAL HIGH (ref 65–99)
Glucose-Capillary: 136 mg/dL — ABNORMAL HIGH (ref 65–99)

## 2017-10-24 LAB — POCT I-STAT, CHEM 8
BUN: 102 mg/dL — AB (ref 6–20)
CHLORIDE: 113 mmol/L — AB (ref 101–111)
CREATININE: 3.6 mg/dL — AB (ref 0.61–1.24)
Calcium, Ion: 1.03 mmol/L — ABNORMAL LOW (ref 1.15–1.40)
GLUCOSE: 148 mg/dL — AB (ref 65–99)
HEMATOCRIT: 28 % — AB (ref 39.0–52.0)
HEMOGLOBIN: 9.5 g/dL — AB (ref 13.0–17.0)
Potassium: 4.2 mmol/L (ref 3.5–5.1)
Sodium: 151 mmol/L — ABNORMAL HIGH (ref 135–145)
TCO2: 25 mmol/L (ref 22–32)

## 2017-10-24 LAB — TYPE AND SCREEN
ABO/RH(D): O POS
ANTIBODY SCREEN: NEGATIVE
UNIT DIVISION: 0

## 2017-10-24 LAB — CBC WITH DIFFERENTIAL/PLATELET
BASOS ABS: 0.1 10*3/uL (ref 0.0–0.1)
Basophils Relative: 1 %
EOS PCT: 0 %
Eosinophils Absolute: 0 10*3/uL (ref 0.0–0.7)
HEMATOCRIT: 30.1 % — AB (ref 39.0–52.0)
Hemoglobin: 9.2 g/dL — ABNORMAL LOW (ref 13.0–17.0)
LYMPHS PCT: 8 %
Lymphs Abs: 0.8 10*3/uL (ref 0.7–4.0)
MCH: 29.8 pg (ref 26.0–34.0)
MCHC: 30.6 g/dL (ref 30.0–36.0)
MCV: 97.4 fL (ref 78.0–100.0)
MONO ABS: 0.2 10*3/uL (ref 0.1–1.0)
MONOS PCT: 3 %
NEUTROS ABS: 8.3 10*3/uL — AB (ref 1.7–7.7)
Neutrophils Relative %: 88 %
PLATELETS: 433 10*3/uL — AB (ref 150–400)
RBC: 3.09 MIL/uL — ABNORMAL LOW (ref 4.22–5.81)
RDW: 20.7 % — ABNORMAL HIGH (ref 11.5–15.5)
WBC: 9.4 10*3/uL (ref 4.0–10.5)

## 2017-10-24 LAB — BPAM RBC
BLOOD PRODUCT EXPIRATION DATE: 201902202359
ISSUE DATE / TIME: 201902131023
Unit Type and Rh: 9500

## 2017-10-24 LAB — COOXEMETRY PANEL
Carboxyhemoglobin: 1.8 % — ABNORMAL HIGH (ref 0.5–1.5)
Methemoglobin: 0.9 % (ref 0.0–1.5)
O2 Saturation: 68.6 %
Total hemoglobin: 9.4 g/dL — ABNORMAL LOW (ref 12.0–16.0)

## 2017-10-24 LAB — PHOSPHORUS: Phosphorus: 4.9 mg/dL — ABNORMAL HIGH (ref 2.5–4.6)

## 2017-10-24 LAB — LACTATE DEHYDROGENASE: LDH: 421 U/L — ABNORMAL HIGH (ref 98–192)

## 2017-10-24 LAB — MAGNESIUM: Magnesium: 2.2 mg/dL (ref 1.7–2.4)

## 2017-10-24 LAB — HEPARIN LEVEL (UNFRACTIONATED): HEPARIN UNFRACTIONATED: 0.36 [IU]/mL (ref 0.30–0.70)

## 2017-10-24 MED ORDER — FREE WATER
200.0000 mL | Freq: Four times a day (QID) | Status: DC
  Administered 2017-10-24 – 2017-10-27 (×10): 200 mL

## 2017-10-24 MED ORDER — PERFLUTREN LIPID MICROSPHERE
1.0000 mL | INTRAVENOUS | Status: AC | PRN
  Administered 2017-10-24: 4 mL via INTRAVENOUS
  Filled 2017-10-24: qty 10

## 2017-10-24 NOTE — Progress Notes (Signed)
Patient ID: Bryan Harmon, male   DOB: [birth year], 67 y.o.   MRN: [id number]   [facility] Progress Note   Nephrology service was reconsulted to render an opinion on Mr.Bryan Harmon's declining renal function and candidacy for renal replacement therapy in the setting of parainfluenza and pneumococcus pneumonia, cardiogenic shock and fever of unknown origin. He was originally seen by Dr. Moshe Harmon on 09/28/2017 and last seen by Dr. Florene Harmon on 09/17/2017 when thought to have been recovering from acute kidney injury with suspected hemodynamic cause-prolonged atrial fibrillation with rapid ventricular response/renal hypoperfusion. Since then, he had an Impella device placed in the operating room on 1/30 and right-sided chest tube placed on 1/31 for parapneumonic effusion. He then started developing fever and echocardiogram showed possible small vegetation on the mitral valve chord. CT scan of the chest/abdomen and pelvis showed worsening pulmonary infiltrates with necrotizing pneumonia on the right with a complex pleural effusion as well as an aggressive left base pneumonia. On 10/16/17 the Impella device was removed due to nonfunctioning possibly from thrombus.    10/21/2017  10/22/2017  10/23/2017  10/24/2017   BUN 81 (H) 98 (H) 102 (H) 107 (H)  Creatinine 3.10 (H) 3.18 (H) 3.35 (H) 3.55 (H)    Assessment/ Plan:   1. Acute kidney injury: This is likely multifactorial from ATN associated with sepsis and cardiorenal component while recovering from hemodynamically mediated renal injury from his initial atrial fibrillation/RVR with renal hypoperfusion. Fortunately, he is nonoliguric and continues to show some response to furosemide but concern is raised with his rising BUN and creatinine. He does not have any acute electrolyte abnormalities and does not have any uremic signs. I had a lengthy discussion with his son that he would not offer any renal replacement therapy at this point given its limited  (if any) benefit on his overall prognosis. Most certainly, renal replacement therapy would improve his labs and confer him some clearance but his underlying pneumonia/cardiogenic shock appears to be beyond medical management at this point. We'll continue to follow and offer input when possible. 2. Respiratory failure: Secondary to necrotizing multifocal pneumococcal pneumonia with associated pleural effusion. Right-sided chest tube in situ. Antibiotics currently on holiday to see if fever improves. 3. Acute on chronic systolic heart failure with cardiogenic shock: On inotropic support with milrinone and norepinephrine. On amiodarone for atrial fibrillation/RVR and continue furosemide drip for diuresis. 4. Elevated LFTs: Suspect shock liver 5. Hypernatremia: Secondary to critical illness/free water deficit-we'll start free water flushes via enteral route.  Subjective:   Events from overnight and since 09/24/2017 are reviewed from the chart.    Objective:   BP 104/71   Pulse (!) 128   Temp (!) 101.8 F (38.8 C)   Resp (!) 21   Ht '5\' 10"'$  (1.778 m)   Wt 102.1 kg (225 lb)   SpO2 93%   BMI 32.28 kg/m   Intake/Output Summary (Last 24 hours) at 10/24/2017 1553 Last data filed at 10/24/2017 1500 Gross per 24 hour  Intake 3597.83 ml  Output 4400 ml  Net -802.17 ml   Weight change: -0.907 kg ()  Physical Exam: Gen: Intubated, awake, unresponsive HEENT: Elevated JVP about 10 cm CVS: Pulse irregular tachycardia, normal S1 and S2 without any obvious murmurs or gallops Resp: Diminished breath sounds at both bases, anteriorly clear. Right chest tube in situ. Abd: Soft, slightly distended, scant bowel sounds Ext: Bilateral Ace wraps over legs, 3+ upper extremity edema, 2+ dependent edema over back  Imaging: Dg Chest  Port 1 View  Result Date: 10/24/2017 CLINICAL DATA:  Respiratory distress.  Endotracheal tube placement. EXAM: PORTABLE CHEST 1 VIEW COMPARISON:  Chest radiograph October 23, 2017 and  CT chest October 15, 2017 FINDINGS: Endotracheal tube tip projects 2.6 cm above the carina. LEFT subclavian central venous catheter projecting in proximal superior vena cava. Feeding tube past proximal stomach, distal tip not included. RIGHT apical chest tube with side port projecting within the chest wall. Stable cardiomegaly splaying the carina. Probable supposition of osseous structures LEFT costophrenic angle resulting in nodular density. Small RIGHT pleural effusion decreased from prior imaging. Persistent retrocardiac consolidation and strandy densities RIGHT lung base. No pneumothorax. RIGHT axilla skin staples and surgical clips. IMPRESSION: No apparent change in life-support lines. Stable cardiomegaly with small RIGHT pleural effusion. Persistent retrocardiac consolidation RIGHT lung base probable atelectasis. Electronically Signed   By: Elon Alas M.D.   On: 10/24/2017 01:32   Dg Chest Port 1 View  Result Date: 10/23/2017 CLINICAL DATA:  Check endotracheal tube placement EXAM: PORTABLE CHEST 1 VIEW COMPARISON:  10/21/2017 FINDINGS: Cardiac shadow remains enlarged. Endotracheal tube and feeding catheter are again noted and stable. Right-sided thoracostomy catheter is noted in satisfactory position. No pneumothorax is noted. Bilateral pleural effusions are seen. No bony abnormality is noted. IMPRESSION: Stable bilateral pleural effusions. Tubes and lines as described. Electronically Signed   By: Inez Catalina M.D.   On: 10/23/2017 08:26    Labs: BMET Recent Labs  Lab 10/18/17 0400  10/19/17 0304  10/20/17 0230  10/21/17 0354 10/21/17 1556 10/21/17 1636 10/22/17 0342 10/22/17 1648 10/23/17 0435 10/23/17 1557 10/24/17 0421  NA 143   < > 144   < > 144   < > 145 147* 146* 147* 150* 148* 149* 149*  K 3.5   < > 4.0   < > 3.8   < > 4.0 3.7 3.7 3.8 4.3 3.7 3.6 3.6  CL 109   < > 111   < > 113*   < > 113* 115* 112* 116*  --  116* 114* 116*  CO2 22  --  22  --  21*  --  21*  --   --  20*   --  20*  --  23  GLUCOSE 166*   < > 136*   < > 139*   < > 174* 115* 116* 106* 143* 117* 145* 124*  BUN 82*   < > 80*   < > 93*   < > 94* 73* 81* 98*  --  102* 94* 107*  CREATININE 2.49*   < > 2.64*   < > 2.91*   < > 3.05* 3.00* 3.10* 3.18*  --  3.35* 3.30* 3.55*  CALCIUM 7.4*  --  7.3*  --  7.3*  --  7.5*  --   --  7.5*  --  7.3*  --  7.4*  PHOS 4.0  --  3.7  --  4.5  --  5.2*  --   --   --   --  4.3  --  4.9*   < > = values in this interval not displayed.   CBC Recent Labs  Lab 10/21/17 0354  10/22/17 0342  10/23/17 0435 10/23/17 1350 10/23/17 1557 10/24/17 0623  WBC 9.1  --  8.4  --  7.4  --   --  9.4  NEUTROABS 8.1*  --  7.6  --  6.7  --   --  8.3*  HGB 8.9*   < >  8.8*   < > 7.7* 9.1* 9.2* 9.2*  HCT 28.1*   < > 27.8*   < > 25.0* 29.5* 27.0* 30.1*  MCV 97.2  --  97.9  --  98.4  --   --  97.4  PLT 345  --  327  --  323  --   --  433*   < > = values in this interval not displayed.    Medications:    . chlorhexidine gluconate (MEDLINE KIT)  15 mL Mouth Rinse BID  . Chlorhexidine Gluconate Cloth  6 each Topical Daily  . feeding supplement (PRO-STAT SUGAR FREE 64)  30 mL Per Tube BID  . Gerhardt's butt cream   Topical BID  . insulin aspart  2-6 Units Subcutaneous Q4H  . insulin glargine  12 Units Subcutaneous BID  . mouth rinse  15 mL Mouth Rinse 10 times per day  . sennosides  5 mL Oral Daily  . sodium chloride flush  3 mL Intravenous Q12H      Elmarie Shiley, MD 10/24/2017, 3:53 PM

## 2017-10-24 NOTE — Progress Notes (Signed)
  Echocardiogram 2D Echocardiogram limited with definity has been performed.  Leta Jungling M 10/24/2017, 12:05 PM

## 2017-10-24 NOTE — Progress Notes (Signed)
Subjective: Family with continued multiple questions. Asking about surgical intervention for the fluid around his lungs, stopping the heparin, trac placement, HD, and best next step. We discussed information pertaining to antibiotic utility and holding antibiotics.   Antibiotics:  Anti-infectives (From admission, onward)   Start     Dose/Rate Route Frequency Ordered Stop   10/22/17 1200  ceftaroline (TEFLARO) 300 mg in sodium chloride 0.9 % 250 mL IVPB  Status:  Discontinued     300 mg 250 mL/hr over 60 Minutes Intravenous Every 12 hours 10/22/17 1055 10/23/17 1546   10/15/17 1200  ceftaroline (TEFLARO) 400 mg in sodium chloride 0.9 % 250 mL IVPB  Status:  Discontinued     400 mg 250 mL/hr over 60 Minutes Intravenous Every 12 hours 10/15/17 1052 10/22/17 1055   10/15/17 1100  metroNIDAZOLE (FLAGYL) IVPB 500 mg  Status:  Discontinued     500 mg 100 mL/hr over 60 Minutes Intravenous Every 8 hours 10/15/17 1052 10/23/17 1546   10/13/17 1200  anidulafungin (ERAXIS) 100 mg in sodium chloride 0.9 % 100 mL IVPB  Status:  Discontinued     100 mg 78 mL/hr over 100 Minutes Intravenous Every 24 hours 10/12/17 1123 10/14/17 1238   10/12/17 1200  anidulafungin (ERAXIS) 200 mg in sodium chloride 0.9 % 200 mL IVPB     200 mg 78 mL/hr over 200 Minutes Intravenous  Once 10/12/17 1123 10/12/17 1702   09/27/2017 2000  vancomycin (VANCOCIN) IVPB 1000 mg/200 mL premix  Status:  Discontinued     1,000 mg 200 mL/hr over 60 Minutes Intravenous Every 24 hours 09/15/2017 1754 10/14/17 0951   09/16/2017 1800  meropenem (MERREM) 1 g in sodium chloride 0.9 % 100 mL IVPB  Status:  Discontinued     1 g 200 mL/hr over 30 Minutes Intravenous Every 12 hours 09/18/2017 1720 10/15/17 1052   09/21/2017 0912  vancomycin (VANCOCIN) 1,000 mg in sodium chloride 0.9 % 1,000 mL irrigation  Status:  Discontinued       As needed 09/27/2017 0912 09/14/2017 1115   09/20/2017 0700  fluconazole (DIFLUCAN) IVPB 400 mg  Status:   Discontinued     400 mg 100 mL/hr over 120 Minutes Intravenous To Surgery 09/10/2017 0650 10/01/2017 1115   10/04/2017 0700  rifampin (RIFADIN) 600 mg in sodium chloride 0.9 % 100 mL IVPB  Status:  Discontinued     600 mg 200 mL/hr over 30 Minutes Intravenous To Surgery 09/21/2017 0650 09/13/2017 1115   09/26/2017 0700  vancomycin (VANCOCIN) powder 1,000 mg  Status:  Discontinued     1,000 mg Other To Surgery 10/10/2017 0650 09/29/2017 1115   10/04/2017 0400  vancomycin (VANCOCIN) 1,250 mg in sodium chloride 0.9 % 250 mL IVPB     1,250 mg 166.7 mL/hr over 90 Minutes Intravenous To Surgery 09/14/2017 1910 09/22/2017 1222   10/01/2017 0400  cefUROXime (ZINACEF) 1.5 g in dextrose 5 % 50 mL IVPB     1.5 g 100 mL/hr over 30 Minutes Intravenous To Surgery 09/27/2017 1910 09/22/2017 1202   10/05/2017 0400  cefUROXime (ZINACEF) 750 mg in dextrose 5 % 50 mL IVPB  Status:  Discontinued     750 mg 100 mL/hr over 30 Minutes Intravenous To Surgery 10/05/2017 1910 09/22/2017 1115   10/01/17 0600  cefTRIAXone (ROCEPHIN) 2 g in dextrose 5 % 50 mL IVPB  Status:  Discontinued     2 g 100 mL/hr over 30 Minutes Intravenous Every 24 hours 10/01/17  9242 09/29/2017 1705   09/29/2017 1315  doxycycline (VIBRA-TABS) tablet 100 mg  Status:  Discontinued     100 mg Oral Every 12 hours 09/14/2017 1301 10/02/17 1346   09/29/2017 1315  cefTRIAXone (ROCEPHIN) 1 g in dextrose 5 % 50 mL IVPB  Status:  Discontinued     1 g 100 mL/hr over 30 Minutes Intravenous Every 24 hours 09/26/2017 1301 10/01/17 0541     Medications: Scheduled Meds: . chlorhexidine gluconate (MEDLINE KIT)  15 mL Mouth Rinse BID  . Chlorhexidine Gluconate Cloth  6 each Topical Daily  . feeding supplement (PRO-STAT SUGAR FREE 64)  30 mL Per Tube BID  . Gerhardt's butt cream   Topical BID  . insulin aspart  2-6 Units Subcutaneous Q4H  . insulin glargine  12 Units Subcutaneous BID  . mouth rinse  15 mL Mouth Rinse 10 times per day  . sennosides  5 mL Oral Daily  . sodium chloride flush  3  mL Intravenous Q12H   Continuous Infusions: . sodium chloride 20 mL/hr at 10/15/17 0608  . amiodarone 30 mg/hr (10/24/17 0900)  . dexmedetomidine (PRECEDEX) IV infusion for high rates 0.4 mcg/kg/hr (10/24/17 0900)  . famotidine (PEPCID) IV Stopped (10/24/17 0908)  . feeding supplement (VITAL 1.5 CAL) 1,000 mL (10/24/17 0900)  . furosemide (LASIX) infusion 12 mg/hr (10/24/17 0900)  . heparin 1,650 Units/hr (10/24/17 0900)  . milrinone 0.5 mcg/kg/min (10/24/17 0900)  . norepinephrine (LEVOPHED) Adult infusion 12 mcg/min (10/24/17 0904)   PRN Meds:.sodium chloride, acetaminophen (TYLENOL) oral liquid 160 mg/5 mL, albuterol, magic mouthwash, midazolam, morphine injection, ondansetron (ZOFRAN) IV, sodium chloride flush, sodium chloride flush  Objective: Weight change:  (-0.907 kg)  Intake/Output Summary (Last 24 hours) at 10/24/2017 0946 Last data filed at 10/24/2017 0900 Gross per 24 hour  Intake 4547.26 ml  Output 4080 ml  Net 467.26 ml   Blood pressure 98/69, pulse (!) 51, temperature (!) 100.9 F (38.3 C), resp. rate 17, height _0  (1.778 m), weight 225 lb (102.1 kg), SpO2 95 %. Temp:  [100.6 F (38.1 C)-101.8 F (38.8 C)] 100.9 F (38.3 C) (02/14 0900) Pulse Rate:  [25-142] 51 (02/14 0845) Resp:  [15-42] 17 (02/14 0900) BP: (87-105)/(65-85) 98/69 (02/14 0500) SpO2:  [92 %-98 %] 95 % (02/14 0845) Arterial Line BP: (85-129)/(55-80) 107/65 (02/14 0900) FiO2 (%):  [40 %-50 %] 40 % (02/14 0830) Weight:  [225 lb (102.1 kg)] 225 lb (102.1 kg) (02/14 0500)  Physical Exam: General: In no acute distress HEENT: Anicteric sclera, pupils reactive to light and accommodation, EOMI CVS: Distant heart sounds, tachycardic, no murmurs, no rubs  Chest: Intubated with diffuse crackles  Abdomen: Soft nontender, nondistended, normal bowel sounds Extremities: Diffuse anasarca  Skin: No rashes Lymph: No new lymphadenopathy Neuro:  Nonfocal  CBC: _1 (wbc3,Hgb:3,Hct:3,Plt:3,INR:3APTT:3)@  BMET Recent Labs    10/23/17 0435 10/23/17 1557 10/24/17 0421  NA 148* 149* 149*  K 3.7 3.6 3.6  CL 116* 114* 116*  CO2 20*  --  23  GLUCOSE 117* 145* 124*  BUN 102* 94* 107*  CREATININE 3.35* 3.30* 3.55*  CALCIUM 7.3*  --  7.4*   Liver Panel  Recent Labs    10/23/17 0913  PROT 4.7*  ALBUMIN 1.5*  AST 35  ALT 32  ALKPHOS 85  BILITOT 1.0  BILIDIR 0.2  IBILI 0.8   Sedimentation Rate No results for input(s): ESRSEDRATE in the last 72 hours. C-Reactive Protein No results for input(s): CRP in the last 72 hours.  Micro  Results: Recent Results (from the past 720 hour(s))  Culture, blood (Routine X 2) w Reflex to ID Panel     Status: Abnormal   Collection Time: 10/05/2017  1:08 PM  Result Value Ref Range Status   Specimen Description BLOOD RIGHT HAND  Final   Special Requests   Final    BOTTLES DRAWN AEROBIC AND ANAEROBIC Blood Culture adequate volume   Culture  Setup Time   Final    GRAM POSITIVE COCCI AEROBIC BOTTLE ONLY CRITICAL RESULT CALLED TO, READ BACK BY AND VERIFIED WITH: J LEDFORD PHARMD 0532 10/01/17 A BROWNING    Culture STREPTOCOCCUS PNEUMONIAE (A)  Final   Report Status 10/03/2017 FINAL  Final   Organism ID, Bacteria STREPTOCOCCUS PNEUMONIAE  Final      Susceptibility   Streptococcus pneumoniae - MIC*    ERYTHROMYCIN <=0.12 SENSITIVE Sensitive     LEVOFLOXACIN 0.5 SENSITIVE Sensitive     PENICILLIN (non-meningitis) 0.25 SENSITIVE Sensitive     CEFTRIAXONE (non-meningitis) <=0.12 SENSITIVE Sensitive     * STREPTOCOCCUS PNEUMONIAE  Blood Culture ID Panel (Reflexed)     Status: Abnormal   Collection Time: 09/29/2017  1:08 PM  Result Value Ref Range Status   Enterococcus species NOT DETECTED NOT DETECTED Final   Listeria monocytogenes NOT DETECTED NOT DETECTED Final   Staphylococcus species NOT DETECTED NOT DETECTED Final   Staphylococcus aureus NOT DETECTED NOT DETECTED Final    Streptococcus species DETECTED (A) NOT DETECTED Final    Comment: CRITICAL RESULT CALLED TO, READ BACK BY AND VERIFIED WITH: J LEDFORD PHARMD 0532 10/01/17 A BROWNING    Streptococcus agalactiae NOT DETECTED NOT DETECTED Final   Streptococcus pneumoniae DETECTED (A) NOT DETECTED Final    Comment: CRITICAL RESULT CALLED TO, READ BACK BY AND VERIFIED WITH: J LEDFORD PHARMD 0532 10/01/17 A BROWNING    Streptococcus pyogenes NOT DETECTED NOT DETECTED Final   Acinetobacter baumannii NOT DETECTED NOT DETECTED Final   Enterobacteriaceae species NOT DETECTED NOT DETECTED Final   Enterobacter cloacae complex NOT DETECTED NOT DETECTED Final   Escherichia coli NOT DETECTED NOT DETECTED Final   Klebsiella oxytoca NOT DETECTED NOT DETECTED Final   Klebsiella pneumoniae NOT DETECTED NOT DETECTED Final   Proteus species NOT DETECTED NOT DETECTED Final   Serratia marcescens NOT DETECTED NOT DETECTED Final   Haemophilus influenzae NOT DETECTED NOT DETECTED Final   Neisseria meningitidis NOT DETECTED NOT DETECTED Final   Pseudomonas aeruginosa NOT DETECTED NOT DETECTED Final   Candida albicans NOT DETECTED NOT DETECTED Final   Candida glabrata NOT DETECTED NOT DETECTED Final   Candida krusei NOT DETECTED NOT DETECTED Final   Candida parapsilosis NOT DETECTED NOT DETECTED Final   Candida tropicalis NOT DETECTED NOT DETECTED Final  Culture, blood (Routine X 2) w Reflex to ID Panel     Status: None   Collection Time: 09/27/2017  1:11 PM  Result Value Ref Range Status   Specimen Description BLOOD LEFT FOREARM  Final   Special Requests IN PEDIATRIC BOTTLE Blood Culture adequate volume  Final   Culture NO GROWTH 5 DAYS  Final   Report Status 10/05/2017 FINAL  Final  Respiratory Panel by PCR     Status: Abnormal   Collection Time: 09/29/2017  1:51 PM  Result Value Ref Range Status   Adenovirus NOT DETECTED NOT DETECTED Final   Coronavirus 229E NOT DETECTED NOT DETECTED Final   Coronavirus HKU1 NOT DETECTED  NOT DETECTED Final   Coronavirus NL63 NOT DETECTED NOT DETECTED Final  Coronavirus OC43 NOT DETECTED NOT DETECTED Final   Metapneumovirus NOT DETECTED NOT DETECTED Final   Rhinovirus / Enterovirus NOT DETECTED NOT DETECTED Final   Influenza A NOT DETECTED NOT DETECTED Final   Influenza B NOT DETECTED NOT DETECTED Final   Parainfluenza Virus 1 NOT DETECTED NOT DETECTED Final   Parainfluenza Virus 2 NOT DETECTED NOT DETECTED Final   Parainfluenza Virus 3 DETECTED (A) NOT DETECTED Final   Parainfluenza Virus 4 NOT DETECTED NOT DETECTED Final   Respiratory Syncytial Virus NOT DETECTED NOT DETECTED Final   Bordetella pertussis NOT DETECTED NOT DETECTED Final   Chlamydophila pneumoniae NOT DETECTED NOT DETECTED Final   Mycoplasma pneumoniae NOT DETECTED NOT DETECTED Final  Culture, blood (routine x 2)     Status: None   Collection Time: 10/06/2017  4:45 PM  Result Value Ref Range Status   Specimen Description BLOOD LEFT HAND  Final   Special Requests IN PEDIATRIC BOTTLE Blood Culture adequate volume  Final   Culture NO GROWTH 5 DAYS  Final   Report Status 10/05/2017 FINAL  Final  MRSA PCR Screening     Status: None   Collection Time: 10/04/2017  4:49 PM  Result Value Ref Range Status   MRSA by PCR NEGATIVE NEGATIVE Final    Comment:        The GeneXpert MRSA Assay (FDA approved for NASAL specimens only), is one component of a comprehensive MRSA colonization surveillance program. It is not intended to diagnose MRSA infection nor to guide or monitor treatment for MRSA infections.   Culture, blood (routine x 2)     Status: None   Collection Time: 09/11/2017  5:06 PM  Result Value Ref Range Status   Specimen Description BLOOD RIGHT HAND  Final   Special Requests IN PEDIATRIC BOTTLE Blood Culture adequate volume  Final   Culture NO GROWTH 5 DAYS  Final   Report Status 10/05/2017 FINAL  Final  Culture, blood (Routine X 2) w Reflex to ID Panel     Status: None   Collection Time: 09/29/2017   4:18 PM  Result Value Ref Range Status   Specimen Description BLOOD RIGHT ANTECUBITAL  Final   Special Requests IN PEDIATRIC BOTTLE Blood Culture adequate volume  Final   Culture   Final    NO GROWTH 5 DAYS Performed at [facility], [place] [address] [place], [place] [address]    Report Status 10/12/2017 FINAL  Final  Culture, blood (Routine X 2) w Reflex to ID Panel     Status: None   Collection Time: 10/01/2017  4:20 PM  Result Value Ref Range Status   Specimen Description BLOOD RIGHT ANTECUBITAL  Final   Special Requests IN PEDIATRIC BOTTLE Blood Culture adequate volume  Final   Culture   Final    NO GROWTH 5 DAYS Performed at [facility], [place] [address] [place], [place] [address]    Report Status 10/12/2017 FINAL  Final  Surgical pcr screen     Status: Abnormal   Collection Time: 09/17/2017  8:37 PM  Result Value Ref Range Status   MRSA, PCR RESULT CALLED TO, READ BACK BY AND VERIFIED WITH: (A) NEGATIVE Final    Comment:  M AMO RN 09/16/2017 0504 JDW   Staphylococcus aureus INVALID RESULTS, SPECIMEN SENT FOR CULTURE (A) NEGATIVE Final    Comment: Results Called to:  M [facility] 09/25/2017 0504 JDW (NOTE) The Xpert SA Assay (FDA approved for NASAL specimens in patients 22 years of  age and older), is one component of a comprehensive surveillance program. It is not intended to diagnose infection nor to guide or monitor treatment.   MRSA culture     Status: None   Collection Time: 09/27/2017  8:37 PM  Result Value Ref Range Status   Specimen Description NASOPHARYNGEAL  Final   Special Requests NONE  Final   Culture NO MRSA DETECTED  Final   Report Status 10/10/2017 FINAL  Final  Culture, blood (routine x 2)     Status: None   Collection Time: 09/26/2017  5:52 PM  Result Value Ref Range Status   Specimen Description BLOOD RIGHT ANTECUBITAL  Final   Special Requests IN PEDIATRIC BOTTLE Blood Culture adequate volume  Final   Culture   Final    NO GROWTH 5 DAYS Performed at [facility], [place] [address] [place], [place] [address]    Report Status 10/14/2017 FINAL  Final  Culture, blood (routine x 2)     Status: None   Collection Time: 10/06/2017  6:00 PM  Result Value Ref Range Status   Specimen Description BLOOD RIGHT HAND  Final   Special Requests IN PEDIATRIC BOTTLE Blood Culture adequate volume  Final   Culture   Final    NO GROWTH 5 DAYS Performed at [facility], [place] [address] [place], [place] [address]    Report Status 10/14/2017 FINAL  Final  Culture, respiratory (NON-Expectorated)     Status: None   Collection Time: 10/10/17  7:55 AM  Result Value Ref Range Status   Specimen Description TRACHEAL ASPIRATE  Final   Special Requests NONE  Final   Gram Stain   Final    RARE WBC PRESENT, PREDOMINANTLY MONONUCLEAR NO ORGANISMS SEEN Performed at [facility], [place] [address], [place], [place] [address]    Culture RARE CANDIDA ALBICANS  Final   Report Status 10/12/2017 FINAL  Final  Culture, bal-quantitative     Status: Abnormal   Collection Time: 10/11/17 11:11 AM  Result Value Ref Range Status   Specimen Description BRONCHIAL ALVEOLAR LAVAGE  Final   Special Requests NONE  Final   Gram Stain   Final    MODERATE WBC PRESENT,BOTH PMN AND MONONUCLEAR NO SQUAMOUS EPITHELIAL CELLS SEEN RARE BUDDING YEAST SEEN Performed at [facility], [place] [address], [place], [place] [address]    Culture 40,000 COLONIES/mL CANDIDA ALBICANS (A)  Final   Report Status 10/13/2017 FINAL  Final  Culture, respiratory (NON-Expectorated)     Status: None   Collection Time: 10/15/17  8:12 AM  Result Value Ref Range Status   Specimen Description TRACHEAL ASPIRATE  Final   Special Requests Normal  Final   Gram Stain   Final    FEW WBC PRESENT, PREDOMINANTLY PMN RARE GRAM NEGATIVE RODS Performed at [facility], [place] [address] [place], [place] [address]    Culture FEW CANDIDA ALBICANS  Final   Report Status 10/18/2017 FINAL  Final  Culture,  blood (Routine X 2) w Reflex to ID Panel     Status: None   Collection Time: 10/15/17  8:52 AM  Result Value Ref Range Status   Specimen Description BLOOD RIGHT HAND  Final   Special Requests IN PEDIATRIC BOTTLE Blood Culture adequate volume  Final   Culture   Final    NO GROWTH 5 DAYS Performed at [facility], [place] [address], [place], [place] [address]    Report Status  10/20/2017 FINAL  Final  Culture, blood (Routine X 2) w Reflex to ID Panel     Status: None   Collection Time: 10/15/17 10:20 AM  Result Value Ref Range Status   Specimen Description BLOOD LEFT HAND  Final   Special Requests IN PEDIATRIC BOTTLE Blood Culture adequate volume  Final   Culture   Final    NO GROWTH 5 DAYS Performed at [facility], [place] [address], [place], [place] [address]    Report Status 10/20/2017 FINAL  Final  Culture, Urine     Status: None   Collection Time: 10/15/17 10:29 AM  Result Value Ref Range Status   Specimen Description URINE, CATHETERIZED  Final   Special Requests Normal  Final   Culture   Final    NO GROWTH Performed at [place] [address] [place], [place] [address]    Report Status 10/16/2017 FINAL  Final  Culture, respiratory (NON-Expectorated)     Status: None   Collection Time: 10/20/17  2:45 PM  Result Value Ref Range Status   Specimen Description TRACHEAL ASPIRATE  Final   Special Requests NONE  Final   Gram Stain   Final    MODERATE WBC PRESENT,BOTH PMN AND MONONUCLEAR RARE SQUAMOUS EPITHELIAL CELLS PRESENT NO ORGANISMS SEEN    Culture   Final    NO GROWTH 2 DAYS Performed at [facility], [place] [address], [place], [place] [address]    Report Status 10/22/2017 FINAL  Final  Culture, Urine     Status: None   Collection Time: 10/20/17  2:56 PM  Result Value Ref Range Status   Specimen Description URINE, CATHETERIZED  Final   Special Requests Normal  Final   Culture   Final    NO GROWTH Performed at [facility], [place]  [address], [place], [place] [address]    Report Status 10/21/2017 FINAL  Final  C difficile quick scan w PCR reflex     Status: None   Collection Time: 10/20/17  2:56 PM  Result Value Ref Range Status   C Diff antigen NEGATIVE NEGATIVE Final   C Diff toxin NEGATIVE NEGATIVE Final   C Diff interpretation No C. difficile detected.  Final    Comment: Performed at [facility], [place] [address], [place], [place] [address]  Culture, blood (routine x 2)     Status: None (Preliminary result)   Collection Time: 10/20/17  3:18 PM  Result Value Ref Range Status   Specimen Description BLOOD RIGHT HAND  Final   Special Requests IN PEDIATRIC BOTTLE Blood Culture adequate volume  Final   Culture   Final    NO GROWTH 3 DAYS Performed at [facility], [place] [address], [place], [place] [address]    Report Status PENDING  Incomplete  Culture, blood (routine x 2)     Status: None (Preliminary result)   Collection Time: 10/20/17  3:24 PM  Result Value Ref Range Status   Specimen Description BLOOD LEFT HAND  Final   Special Requests IN PEDIATRIC BOTTLE Blood Culture adequate volume  Final   Culture   Final    NO GROWTH 3 DAYS Performed at [facility], [place] [address], [place], [place] [address]    Report Status PENDING  Incomplete   Studies/Results: Dg Chest Port 1 View  Result Date: 10/24/2017 CLINICAL DATA:  Respiratory distress.  Endotracheal tube placement. EXAM: PORTABLE CHEST 1 VIEW COMPARISON:  Chest radiograph October 23, 2017 and CT chest October 15, 2017 FINDINGS: Endotracheal tube tip projects 2.6 cm above the carina. LEFT subclavian central venous catheter projecting in proximal superior vena cava. Feeding tube past proximal stomach, distal tip not included. RIGHT apical chest tube with side port projecting within the chest wall. Stable cardiomegaly splaying the carina. Probable supposition of osseous structures LEFT costophrenic angle resulting in nodular density. Small RIGHT  pleural effusion decreased from prior imaging. Persistent retrocardiac consolidation and strandy densities RIGHT lung base. No pneumothorax. RIGHT axilla skin staples and surgical clips. IMPRESSION: No apparent change in life-support lines. Stable cardiomegaly with small RIGHT pleural effusion. Persistent retrocardiac consolidation RIGHT lung base probable atelectasis. Electronically Signed   By: Elon Alas M.D.   On: 10/24/2017 01:32   Dg Chest Port 1 View  Result Date: 10/23/2017 CLINICAL DATA:  Check endotracheal tube placement EXAM: PORTABLE CHEST 1 VIEW COMPARISON:  10/21/2017 FINDINGS: Cardiac shadow remains enlarged. Endotracheal tube and feeding catheter are again noted and stable. Right-sided thoracostomy catheter is noted in satisfactory position. No pneumothorax is noted. Bilateral pleural effusions are seen. No bony abnormality is noted. IMPRESSION: Stable bilateral pleural effusions. Tubes and lines as described. Electronically Signed   By: Inez Catalina M.D.   On: 10/23/2017 08:26   Assessment/Plan:  INTERVAL HISTORY:  - Patient remains febrile. - Evaluated by Dr. Megan Salon yesterday and antibiotics were stopped  - No new culture data - Continues to be on Milrinone and Norepinephrine  - Cardiology planning to get echocardiogram today  - Family continues to not understand the clinical situation and continue to make unrealistic requests/comments. Declined palliative consult.    ASSESSMENT: MOO GRAVLEY a 67 y.o.maleHFrEF and Atriat fibrillation who presented on 1/21in A-fib with RVR and low output heart failure. He has subsequently has had a complicated hospitalizationand persistent fevers since 1/30. Patient is critically ill with a very poor prognosis. Family continues to have unrealistic expectations and does not fully understand the clinically information.  - Continue to observe off antibiotics    LOS: 24 days   [facility] 10/24/2017, 9:46 AM

## 2017-10-24 NOTE — Progress Notes (Signed)
Nutrition Follow-up  DOCUMENTATION CODES:   Non-severe (moderate) malnutrition in context of chronic illness  INTERVENTION:  Tube Feeding:  Vital 1.5 @ 60 ml/hr Pro-Stat 30 mL TID Provides 2460 kcals, 143 of protein, 1094 mL of free water  NUTRITION DIAGNOSIS:   Moderate Malnutrition related to chronic illness(CHF) as evidenced by mild fat depletion, severe fat depletion. -Being addressed via TF  GOAL:   Provide needs based on ASPEN/SCCM guidelines -Progressing  MONITOR:   TF tolerance, Weight trends, Vent status, Skin, Labs  ASSESSMENT:   67 yo male admitted with acute respiratory failure with RLL pneumonia as well as acute on chronic CHF, AKI. Pt with hx of HTN, CHF  Patient is currently intubated on ventilator support MV: 18.1 L/min Temp (24hrs), Avg:101.2 F (38.4 C), Min:100.6 F (38.1 C), Max:101.8 F (38.8 C)  1/31 Chest Tube 2/4 ID New DTI to back and sacrum 2/5 CT of chest/abd/pelvis worsening pulmonary infiltrates, necrotizing PNA; impella placed 2/6 Impella removed 2/12 New Stage II to Buttocks noted  Tolerating V1.5 at 48mL/hr, PS TID  Labs:   Na 149, BUN/Creatinine 107/3.55, Phos 4.9 Amiodarone gtt, Precedex gtt, Lasix gtt, Milrinone gtt, Levo gtt Map: 68-89  Diet Order:  Diet NPO time specified Diet NPO time specified  EDUCATION NEEDS:   Not appropriate for education at this time  Skin:  Skin Assessment: Skin Integrity Issues: Skin Integrity Issues:: Stage II DTI: sacrum (elolved into 3 open areas), back (resolved) Stage II: to sacrum Incisions: chest Other: MASD   Last BM:  2/13  Height:   Ht Readings from Last 1 Encounters:  10/01/17 5\' 10"  (1.778 m)    Weight:   Wt Readings from Last 1 Encounters:  10/24/17 225 lb (102.1 kg)    Ideal Body Weight:  75.5 kg  BMI:  Body mass index is 32.28 kg/m.  Estimated Nutritional Needs:   Kcal:  2572 calories  Protein:  120-160 g  Fluid:  </= 1.8 L  Dionne Ano. Jillene Wehrenberg, MS, RD  LDN Inpatient Clinical Dietitian Pager [phone]

## 2017-10-24 NOTE — Progress Notes (Signed)
ANTICOAGULATION CONSULT NOTE - Follow Up Consult  Pharmacy Consult:  Heparin Indication: atrial fibrillation and LA thrombus  Allergies  Allergen Reactions  . Fentanyl And Related Other (See Comments)    Patients eyes roll in the back of his head and patient has seizure like activity    Patient Measurements: Height: 5\' 10"  (177.8 cm) Weight: 225 lb (102.1 kg) IBW/kg (Calculated) : 73  Heparin dosing weight = 84 kg  Vital Signs: Temp: 100.6 F (38.1 C) (02/14 0700) BP: 98/69 (02/14 0500) Pulse Rate: 132 (02/14 0615)  Labs: Recent Labs    10/22/17 0342  10/23/17 0435 10/23/17 0438 10/23/17 1350 10/23/17 1557 10/24/17 0421 10/24/17 0623  HGB 8.8*   < > 7.7*  --  9.1* 9.2*  --  9.2*  HCT 27.8*   < > 25.0*  --  29.5* 27.0*  --  30.1*  PLT 327  --  323  --   --   --   --  433*  HEPARINUNFRC  --    < >  --  0.12* 0.28*  --  0.36  --   CREATININE 3.18*  --  3.35*  --   --  3.30* 3.55*  --    < > = values in this interval not displayed.    Estimated Creatinine Clearance: 24.8 mL/min (A) (by C-G formula based on SCr of 3.55 mg/dL (H)).  Assessment: 20 YOM known to pharmacy for heparin management with impella. Impella removed 10/16/17 due to stoppage from small vegetation that got sucked into the catheter. Heparin resumed ~ 4 hours after removal for afib and LA thrombus.   Heparin level slightly above goal at 0.36, on 1700 units/hr. Likely related to increasing Scr (now 3.55 today). Hgb is stable at 9.2, platelets are 433 - did received 1 PRBCs yesterday. Bloody output from chest tube has improved, only slight bleeding near gums occasionally. No infusion issues noted.    Goal of Therapy:  Heparin level 0.3 units/ml Monitor platelets by anticoagulation protocol: Yes   Plan:  Reduce heparin gtt to 1650 units/hr Daily heparin level and CBC Monitor signs/symptoms of bleeding  Girard Cooter, PharmD Clinical Pharmacist  Pager: [phone] Clinical Phone for 10/24/2017 until  3:30pm: [phone] If after 3:30pm, please call main pharmacy at [phone]

## 2017-10-24 NOTE — Progress Notes (Signed)
Patient ID: Bryan Harmon, male   DOB: 1951/07/08, 67 y.o.   MRN: [id number]     Advanced Heart Failure Rounding Note  Primary Cardiologist: Aundra Dubin   Subjective:    Impella 5.0 placed in the OR 1/30.  Chest tube placed on right 1/31.   2/4 ID consulted for ongoing fever. 2/4 Neuro consulted for ?seizure. CT of head with no acute findings. EEG without seizure activity.  Echo for Impella placement on 2/5 showed a small vegetation on a mitral valve chord.   2/5 CT of chest/abd/pelvis: Worsening pulmonary infiltrates.  Necrotizing PNA on right with complex pleural effusion. Also with aggressive left base PNA.    2/6 Impella removed due to blockage => suspect small vegetation on MV chord was drawn into the Impella, stopping the device.   Remains on amio 30 mg/hr, milrinone 0.5 mcg/kg/min, norepinephrine 12.  He is on Lasix gtt at 12 mg/hr with good UOP.   Intubated and sedated. Following commands.   ID saw again yesterday, ? Drug fever => abx stopped yesterday.  Still with fever to 101 overnight.   Serosanguinous drainage from right chest tube.   Creatinine 2.2>2.5 >2.6>2.7>2.49 > 2.6 > 2.9 > 3.05 > 3.18 > 3.35 > 3.55.  Good UOP yesterday despite rise in creatinine.   Hgb 8.3 after 1 unit yesterday.   CVP 12-13. Coox 69%.   Echo: Severe LV dilation, EF 15-20%, severely dilated RV with severely decreased RV systolic function, moderate-severe MR, moderate-severe TR, cannot rule out LV apical thrombus.  Repeat echo with Definity showed definite LV apical thrombus.   TEE (1/28): EF 10-15%, diffuse hypokinesis, small LV apical thrombus, mildly dilated RV with moderately decreased systolic function, no LAA thrombus.    Objective:   Weight Range: 225 lb (102.1 kg) Body mass index is 32.28 kg/m.   Vital Signs:   Temp:  [100.6 F (38.1 C)-101.8 F (38.8 C)] 100.6 F (38.1 C) (02/14 0700) Pulse Rate:  [25-141] 132 (02/14 0615) Resp:  [15-42] 18 (02/14 0700) BP:  (87-105)/(65-85) 98/69 (02/14 0500) SpO2:  [88 %-98 %] 96 % (02/14 0700) Arterial Line BP: (85-129)/(47-80) 103/64 (02/14 0700) FiO2 (%):  [40 %-50 %] 40 % (02/14 0615) Weight:  [225 lb (102.1 kg)] 225 lb (102.1 kg) (02/14 0500) Last BM Date: 10/23/17  Weight change: Filed Weights   10/22/17 0256 10/23/17 0500 10/24/17 0500  Weight: 231 lb (104.8 kg) 227 lb (103 kg) 225 lb (102.1 kg)    Intake/Output:   Intake/Output Summary (Last 24 hours) at 10/24/2017 0817 Last data filed at 10/24/2017 0800 Gross per 24 hour  Intake 4486.31 ml  Output 4095 ml  Net 391.31 ml      Physical Exam   CVP 12-13 General: Intubated, frail, tachypneic Neck: JVP 10 cm, no thyromegaly or thyroid nodule.  Lungs: Decreased breath sounds at bases bilaterally.  CV: Lateral PMI.  Heart mildly tachy, irregular S1/S2, no S3/S4, no murmur.  2+ edema lower legs and forearms.  Abdomen: Soft, nontender, no hepatosplenomegaly, no distention.  Skin: Intact without lesions or rashes.  Neurologic: Alert and oriented x 3.  Psych: Normal affect. Extremities: No clubbing or cyanosis.  HEENT: Normal.    Telemetry   A fib 120s, personally reviewed.   EKG    No new tracings.  Labs    CBC Recent Labs    10/23/17 0435  10/23/17 1557 10/24/17 0623  WBC 7.4  --   --  9.4  NEUTROABS 6.7  --   --  8.3*  HGB 7.7*   < > 9.2* 9.2*  HCT 25.0*   < > 27.0* 30.1*  MCV 98.4  --   --  97.4  PLT 323  --   --  433*   < > = values in this interval not displayed.   Basic Metabolic Panel Recent Labs    10/23/17 0435 10/23/17 1557 10/24/17 0421  NA 148* 149* 149*  K 3.7 3.6 3.6  CL 116* 114* 116*  CO2 20*  --  23  GLUCOSE 117* 145* 124*  BUN 102* 94* 107*  CREATININE 3.35* 3.30* 3.55*  CALCIUM 7.3*  --  7.4*  MG 2.2  --  2.2  PHOS 4.3  --  4.9*   Liver Function Tests Recent Labs    10/23/17 0913  AST 35  ALT 32  ALKPHOS 85  BILITOT 1.0  PROT 4.7*  ALBUMIN 1.5*   No results for input(s): LIPASE,  AMYLASE in the last 72 hours. Cardiac Enzymes No results for input(s): CKTOTAL, CKMB, CKMBINDEX, TROPONINI in the last 72 hours.  BNP: BNP (last 3 results) Recent Labs    12/13/16 0949 02/11/17 1049 09/10/2017 1218  BNP 369.2* 562.7* 2,485.9*    ProBNP (last 3 results) No results for input(s): PROBNP in the last 8760 hours.   D-Dimer No results for input(s): DDIMER in the last 72 hours. Hemoglobin A1C No results for input(s): HGBA1C in the last 72 hours. Fasting Lipid Panel No results for input(s): CHOL, HDL, LDLCALC, TRIG, CHOLHDL, LDLDIRECT in the last 72 hours. Thyroid Function Tests No results for input(s): TSH, T4TOTAL, T3FREE, THYROIDAB in the last 72 hours.  Invalid input(s): FREET3  Other results:   Imaging    Dg Chest Port 1 View  Result Date: 10/24/2017 CLINICAL DATA:  Respiratory distress.  Endotracheal tube placement. EXAM: PORTABLE CHEST 1 VIEW COMPARISON:  Chest radiograph October 23, 2017 and CT chest October 15, 2017 FINDINGS: Endotracheal tube tip projects 2.6 cm above the carina. LEFT subclavian central venous catheter projecting in proximal superior vena cava. Feeding tube past proximal stomach, distal tip not included. RIGHT apical chest tube with side port projecting within the chest wall. Stable cardiomegaly splaying the carina. Probable supposition of osseous structures LEFT costophrenic angle resulting in nodular density. Small RIGHT pleural effusion decreased from prior imaging. Persistent retrocardiac consolidation and strandy densities RIGHT lung base. No pneumothorax. RIGHT axilla skin staples and surgical clips. IMPRESSION: No apparent change in life-support lines. Stable cardiomegaly with small RIGHT pleural effusion. Persistent retrocardiac consolidation RIGHT lung base probable atelectasis. Electronically Signed   By: Elon Alas M.D.   On: 10/24/2017 01:32     Medications:     Scheduled Medications: . chlorhexidine gluconate (MEDLINE  KIT)  15 mL Mouth Rinse BID  . Chlorhexidine Gluconate Cloth  6 each Topical Daily  . feeding supplement (PRO-STAT SUGAR FREE 64)  30 mL Per Tube BID  . Gerhardt's butt cream   Topical BID  . insulin aspart  2-6 Units Subcutaneous Q4H  . insulin glargine  12 Units Subcutaneous BID  . mouth rinse  15 mL Mouth Rinse 10 times per day  . sennosides  5 mL Oral Daily  . sodium chloride flush  3 mL Intravenous Q12H    Infusions: . sodium chloride 20 mL/hr at 10/15/17 0608  . amiodarone 30 mg/hr (10/24/17 0800)  . dexmedetomidine (PRECEDEX) IV infusion for high rates 0.4 mcg/kg/hr (10/24/17 0800)  . famotidine (PEPCID) IV Stopped (10/23/17 1304)  . feeding supplement (  VITAL 1.5 CAL) 1,000 mL (10/24/17 0810)  . furosemide (LASIX) infusion 12 mg/hr (10/24/17 0809)  . heparin 1,650 Units/hr (10/24/17 0805)  . milrinone 0.5 mcg/kg/min (10/24/17 0800)  . norepinephrine (LEVOPHED) Adult infusion 12.053 mcg/min (10/24/17 0800)    PRN Medications: sodium chloride, acetaminophen (TYLENOL) oral liquid 160 mg/5 mL, albuterol, magic mouthwash, midazolam, morphine injection, ondansetron (ZOFRAN) IV, sodium chloride flush, sodium chloride flush    Patient Profile  Mr Gaglio is a 67 year old with a history of PAF S/P DC-CV 11/2016, s/p bilateral inguinal hernia repair 11/2017, PVCs, HTN, NICM, chronic systolic heart failure.   Sent from Urgent Care with A fib RVR. Acutely SOB on arrival   Assessment/Plan   1. Acute hypoxemic respiratory failure: RLL PNA, Strep pneumoniae in blood cultures. Respiratory cultures with parainfluenzae virus. Also acute/chronic systolic CHF.Now intubated s/p Impella 5.0 placement, multifocal PNA on CXR and CT chest. Remains critically ill.  Antibiotics broadened from ceftriaxone to vancomycin/meropenem on 1/30. BAL with Candida albicans, anidulafungin started 2/2.  Necrotizing PNA with complex pleural effusion on right on 2/5 CT, anti-microbials changed to ceftaroline  and Flagyl on 2/5. Has had PSV trials daily.  ID concerned about possible drug fever, stopped antibiotics on 2/13.  - Right chest tube placed 1/31, still with serosanguineous drainage. - I suspect that necrosis in right lung + loculated effusion would require surgery to quell infection.  However, he is too unstable for this.  2.Acute on chronic systolic CHF-> cardiogenic shock : Nonischemic cardiomyopathy. 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 Etiology of his CHF is not clear =>no definite inciting event. He has a history of HTN but doubt this was the only trigger. 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 This would ideally be confirmed by cMRI, but he has not wanted an MRI (concerned about side effects). He also has frequent PVCs, 21% total on last holter in 4/18 which is a risk for fall in EF.No family history of CMP. Cannot rule out viral myocarditis. SPEP negative. With medical management, he initially felt a lot better. However, he quit all his meds in early 2018 with recurrence of NYHA III symptoms and onset of atrial fibrillation.He had TEE-DCCV in 3/18.TEE showed that EF remained25%. He quit his meds again in 4/18 and apparently did not restart them when I asked him to in 6/18. No meds probably since 4/18. Echo this admission with EF 15-20%, severe RV dysfunction, LV apical thrombus. RV is hypokinetic.  Attempted DCCV on 1/28 failed likely due to pressors/inotropes.  [place] 1/29 with low cardiac output => Impella 5.0 placed 1/30. On 2/6, Impella removed due to pump stop (suspect vegetation from MV chord sucked into Impella). Suspect combination of septic/vasogenic shock and cardiogenic shock.  Luiz Blare out on 2/10.   Coox 69% on milrinone 0.5 mcg/kg/min, norepinephrine 12.  Creatinine up again to 3.55 with CVP 12-13.  - Good co-ox, continue current milrinone and norepinephrine.   - Good UOP, continue Lasix gtt  at 12 mg/hr.  - End point still difficult to envision => would be poor LVAD candidate with renal failure and RV failure.   3. Atrial fibrillation:Persistent, now with RVR. HR up to 140sinitially, now 110s on amiodarone gtt at30. Not sure how long he has been in atrial fibrillation with RVR, but this may have triggered his CHF and AKI due to worsening of cardiac output.  -Heparin gtt ongoing. - Continue amiodarone gtt at 30 mg/hr.   -  Failed TEE-guided DCCV on 1/28 in setting of vasoactive meds. He remains on milrinone/norepinephrine.  I do not think that repeat DCCV would be likely to hold at this point, but his family is very fixated on re-attempting cardioversion.  I will get a limited echo, and if he does not have enlarging LV thrombus, I will hold tube feeds and re-attempt DCCV tomorrow despite low chance of long-term success.  I think that the family needs to see that everything has been attempted before they will consider comfort care.   4. AKI: Creatinine 1.08 in 6/18, he has not been on any meds. Suspect cardiorenal syndrome with afib/RVR and fall in cardiac output.He was initially hyperkalemic and acidotic.Today, BUN/creatinine continue to rise despite adequate cardiac output by co-ox. - Support CO by increasing milrinone.  - Continue Lasix gtt with elevated CVP.  - Worsening renal function despite support is concerning, he is not HD candidate.  5. HT:DSKAJ pneumo PNA as well as parainfluenza virus.CT chest 1/29 with multifocal PNA. On  1/30 to vancomycin/meropenem, then anidulafungin added.  ID has seen. Now on Teflaro and metronidazole. CT of chest/abd/pelvis 2/5 concerning for necrotizing right pneumonia with surrounding complex large pleural effusion.  He has a chest tube with ongoing serosanguineous drainage. 2/5 showed a small vegetation on a mitral valve chord.  Repeat ECHO on 2/6 did not show vegetation. Assumed it was pulled into Impella.  He is now off [employer] but febrile  to 101 again last night. - ID saw yesterday, held abx for ?drug fever.  Will reculture off abx today.   - Necrosis in right lung + loculated effusion would ideally require surgery to effectively treat infection, however do not think he would survive surgery at this point.    6. Elevated LFTs: Suspect shock liver type picture. - AST/ALT > 1000 at admission but have trended down steadily. No change 7. LV thrombus: Noted on TTE and TEE, small.  - Remains on heparin.  No change.  8. Hypernatremia: Mild, Na 149 today.  9. Malnutrition: Tube feeds on going.  10. Anemia: Significant blood loss via chest tube and mouth.   Received 2UPRBCs 2/3, 2/4 5 uPRBCs, 2/5 2UPRBCs. 2/6 2U PRBCs. 1 unit PRBCs 2/9, 1 unit PRBCs 2/13.  Hgb stable today.   11. Suspected Deep Tissue Injury: Sacrum/R buttock/Mid back . Continue to repostion R/L. WOC consult appreciated. 12. Seizure: Neurology appreciated. CT head negative. EEG no seizure noted. No change.   Patient is critically ill and remains in multisystem organ failure.  I spoke with the son and daughter again for 30 minutes this morning.  I strongly recommended a comfort care approach at this point and suggested palliative care.  They say he would want ongoing aggressive care and are not interested in palliative care.  They are very concerned about his atrial fibrillation and cardioversion.  I have told them that DCCV would be very unlikely to hold while on milrinone and norepinephrine.  However, I think that to show them we have tried everything, I will need to attempt this.  Will do limited echo today, if LV thrombus is not larger, I will try DCCV in am after holding TFs.   CRITICAL CARE Performed by: Loralie Champagne  Total critical care time: 45 minutes  Critical care time was exclusive of separately billable procedures and treating other patients.  Critical care was necessary to treat or prevent imminent or life-threatening deterioration.  Critical care was  time spent personally by me on the  following activities: development of treatment plan with patient and/or surrogate as well as nursing, discussions with consultants, evaluation of patient's response to treatment, examination of patient, obtaining history from patient or surrogate, ordering and performing treatments and interventions, ordering and review of laboratory studies, ordering and review of radiographic studies, pulse oximetry and re-evaluation of patient's condition.  Loralie Champagne 10/24/2017 8:17 AM

## 2017-10-24 NOTE — Progress Notes (Addendum)
At 0035 patient went in to severe respiratory distress, with a resp rate in the upper 40s, O2 sat 92%;  heart rate up into the upper 150s; retractions and guppy breathing noted; lung sounds very diminished on the right (previously rhonchus,) and loud rhonchi on the left; patient thrashing around in bed with panicked expression; extremely diaphoretic. Administered PRN Morphine and Versed. STAT portable chest obtained. STAT ABG obtained. Respiratory to bedside to deep suction and lavage. Large amount of thick, chunky sputum suctioned x 2. After suctioning, heart rate, respiratory rate, slowly returned to baseline. RASS -2 with sedation present. Will continue to closely monitor. Thresa Ross RN

## 2017-10-24 NOTE — Progress Notes (Signed)
Applied ice packs to pt to help reduce temp.

## 2017-10-24 NOTE — Progress Notes (Signed)
PULMONARY / CRITICAL CARE MEDICINE   Name: Bryan Harmon MRN: [id number] DOB: 1951/04/09    ADMISSION DATE:  10/03/2017  REFERRING MD:  Nils Pyle  CHIEF COMPLAINT:  Fatigue  HISTORY OF PRESENT ILLNESS:   67 y/o male with non-ischemic cardiomyopathy and afib admitted with decompensated heart failure.  Required Impella, vent.    Lines/ Tubes 1/30 L IJ CVL > 1/31 1/30 R IJ swan > 1/30 R sub clav impella > 10/16/2017 1/30 L radial arterial line > 10/15/2017 1/30 ETT >  1/31 L [place] TLC >  10/15/2017 right femoral arterial line placed>>  Cultures: 1/21 Blood >> Strep Pneumoniae >> pan sens 1/21 Resp Viral Panel >> + Parainfluenza Virus 3 1/28 blood > ng 1/30 blood > ng 1/31 resp culture > ng 2/1 BAL > candida 10/15/2017 blood cultures x2>> 10/15/2017 tracheal aspirate>> rare gram-negative rods few Candida albicans 10/15/2017 urine culture>> negative  ABX: Infectious disease consult 10/14/2017 Rocephin 1/21 >> 1/29 Doxy 1/21 >> 1/23 Vanc 1/30 >> 10/14/2017 Mero 1/30 >> 10/15/2017 Eraxis 2/2 >> off Teflaro 10/15/2017>>2/14 Flagyl 10/15/2017>>2/14  Studies: 10/01/2017>> Echo EF 15-20%, Apex ? PAP muscle vs. Thrombus, LV severely dilated, moderate concentric hypertrophy, systolic function normal, unable to evaluate LV diastolic function due to atrial fibrillation. + moderate spontaneous echo contrast, indicative of stasis Impression 1/23 Echo > Severely dilated LV with severe LV dysfunction globally with EF 15-20%. Severely dialted RV with severe RV dysfunction. Moderate to severe MR with ERO 0.33cm2 and MR volume 46m. Moderate tosevere TR with moderate pulmonary HTN. Moderately thickened and calcified AV leaflets with mild MR, mildly dilated aortic root, massive biatrial enlargement. Cannot rule out LV thrombus. Thereis significant spontaneous echo contrast in LV c/w sluggish blood flow. The right ventricular systolic pressure was increasedconsistent with moderate pulmonary  hypertension. 1/29 CT chest > dense bilateral lower lobe consolidation R>L 1/30 TEE >> LVEF 20-25%, impella in place, poor overall contractility but LV improved  SUBJECTIVE:   No events overnight, on precedex  VITAL SIGNS: BP 98/69   Pulse (!) 57   Temp (!) 100.9 F (38.3 C)   Resp (!) 22   Ht _0  (1.778 m)   Wt 225 lb (102.1 kg)   SpO2 95%   BMI 32.28 kg/m    HEMODYNAMICS: CVP:  [5 mmHg-19 mmHg] 15 mmHg  VENTILATOR SETTINGS: Vent Mode: PCV FiO2 (%):  [40 %-50 %] 40 % Set Rate:  [10 bmp] 10 bmp PEEP:  [5 cmH20] 5 cmH20 Pressure Support:  [10 cmH20] 10 cmH20 Plateau Pressure:  [15 cmH20-18 cmH20] 15 cmH20  INTAKE / OUTPUT: I/O last 3 completed shifts: In: 6416.6 [I.V.:3031.6; Blood:315; NG/GT:2320; IV Piggyback:750] Out: 51638 [GYKZL:9357 Stool:50; Chest Tube:600]  PHYSICAL EXAMINATION: General: Frail appearing male, unresponsive HEENT: ETT in place, [place]/AT, PERRL, EOM-spontaneous Neuro: Unresponsive but moving all ext spontaneously not to command CV: RRR, Nl S1/S2 and -M/R/G. PULM: Diffuse crackles GI: Soft, NT, ND and +BS Extremities: warm/dry, decreased edema  Skin: Skin breakdown noted at multiple pressure points.  LABS:  BMET Recent Labs  Lab 10/22/17 0342  10/23/17 0435 10/23/17 1557 10/24/17 0421  NA 147*   < > 148* 149* 149*  K 3.8   < > 3.7 3.6 3.6  CL 116*  --  116* 114* 116*  CO2 20*  --  20*  --  23  BUN 98*  --  102* 94* 107*  CREATININE 3.18*  --  3.35* 3.30* 3.55*  GLUCOSE 106*   < > 117* 145*  124*   < > = values in this interval not displayed.   Electrolytes Recent Labs  Lab 10/21/17 0354 10/22/17 0342 10/23/17 0435 10/24/17 0421  CALCIUM 7.5* 7.5* 7.3* 7.4*  MG 2.2 2.3 2.2 2.2  PHOS 5.2*  --  4.3 4.9*   CBC Recent Labs  Lab 10/22/17 0342  10/23/17 0435 10/23/17 1350 10/23/17 1557 10/24/17 0623  WBC 8.4  --  7.4  --   --  9.4  HGB 8.8*   < > 7.7* 9.1* 9.2* 9.2*  HCT 27.8*   < > 25.0* 29.5* 27.0* 30.1*  PLT 327  --   323  --   --  433*   < > = values in this interval not displayed.   Coag's Recent Labs  Lab 10/18/17 0400  APTT 98*   Sepsis Markers No results for input(s): LATICACIDVEN, PROCALCITON, O2SATVEN in the last 168 hours.  ABG Recent Labs  Lab 10/23/17 0419 10/24/17 0104 10/24/17 0456  PHART 7.419 7.274* 7.418  PCO2ART 29.8* 48.9* 34.3  PO2ART 81.0* 89.0 84.0   Liver Enzymes Recent Labs  Lab 10/23/17 0913  AST 35  ALT 32  ALKPHOS 85  BILITOT 1.0  ALBUMIN 1.5*   Cardiac Enzymes No results for input(s): TROPONINI, PROBNP in the last 168 hours.  Glucose Recent Labs  Lab 10/23/17 0925 10/23/17 1206 10/23/17 2052 10/23/17 2336 10/24/17 0349 10/24/17 0759  GLUCAP 119* 111* 116* 117* 117* 100*   Imaging Dg Chest Port 1 View  Result Date: 10/24/2017 CLINICAL DATA:  Respiratory distress.  Endotracheal tube placement. EXAM: PORTABLE CHEST 1 VIEW COMPARISON:  Chest radiograph October 23, 2017 and CT chest October 15, 2017 FINDINGS: Endotracheal tube tip projects 2.6 cm above the carina. LEFT subclavian central venous catheter projecting in proximal superior vena cava. Feeding tube past proximal stomach, distal tip not included. RIGHT apical chest tube with side port projecting within the chest wall. Stable cardiomegaly splaying the carina. Probable supposition of osseous structures LEFT costophrenic angle resulting in nodular density. Small RIGHT pleural effusion decreased from prior imaging. Persistent retrocardiac consolidation and strandy densities RIGHT lung base. No pneumothorax. RIGHT axilla skin staples and surgical clips. IMPRESSION: No apparent change in life-support lines. Stable cardiomegaly with small RIGHT pleural effusion. Persistent retrocardiac consolidation RIGHT lung base probable atelectasis. Electronically Signed   By: Elon Alas M.D.   On: 10/24/2017 01:32   DISCUSSION: 67 y/o male with ischemic cardiomyopathy admitted with decompensated systolic heart  failure in the setting of strep pneumo pneumonia and bacteremia s/p impella.  He has acute on chronic kidney failure, cardiogenic shock.  10/15/2017 with fever 105.6, pancultured 01/2018, ID consult 10/14/2017. 's overall cardiac function has declined despite all interventions.  Family wants to continue with maximum interventions.  10/18/2016 CODE STATUS changed to a limited with no chest compressions but all other interventions will be continued.  Despite his poor prognosis he is continued to improve therefore possibility of tracheostomy may be undertaken within 72 hours. 10/19/2017 no significant change in status.  I do note there is some having been placed on pressure regulated volume control mode of ventilation which was returned to written orders for pressure control ventilation on 10/19/2017. His hemoglobin is drifting down he may be ready for follow-up transfusion 10/20/2017 continues to slowly improve.  Massive anasarca has improved despite being a positive I&O.  His Levophed is weaning.  Plan for tracheostomy in the next 24-72 hours  ASSESSMENT / PLAN:  PULMONARY A: Acute respiratory failure with hypoxemia  Pneumococcal CAP - RLL  Acute pulmonary edema Bilateral pleural effusions , R >> L P:   Place back on full vent support Not a candidate for tracheostomy as BUN is very elevated that will result in bleeding and patient will not improve from this thus tracheostomy is a futile procedure which will not be performed.  Family was made aware of this and that tracheostomy is off the table. Titrate O2 for sat of 88-92% May extubate to BiPAP per family's request but concern is airway protection  CARDIOVASCULAR A:  Acute decompensated cardiomyopathy > severe biventricular failure, LVEF ~ 5% S/p Impella 1/30 with removal on 10/16/2017 after failure most likely from vegetation been ingested by the Impella intake. Cardiogenic Shock.  Multiple vasopressor dependent Medical Non-Compliance - stopped taking all  meds prior to admit for CHF, ? Long term plan 10/11/2017 right chest tube P:  Volume management, inotrope management as per advanced heart failure team plans, TCTS plans.   Lasix as ordered but renal function is deteriorating Ct levophed which is being weaned also./ milrinone gtt Amiodarone drip, cardioversion in AM per cardiology Prognosis is very poor but family is not quite understanding how ill the patient is and that his chances of survival are very poor CT to water seal  RENAL Lab Results  Component Value Date   CREATININE 3.55 (H) 10/24/2017   CREATININE 3.30 (H) 10/23/2017   CREATININE 3.35 (H) 10/23/2017   CREATININE 1.11 03/25/2012   Recent Labs  Lab 10/23/17 0435 10/23/17 1557 10/24/17 0421  K 3.7 3.6 3.6   Recent Labs  Lab 10/23/17 0435 10/23/17 1557 10/24/17 0421  NA 148* 149* 149*   Intake/Output Summary (Last 24 hours) at 10/24/2017 1043 Last data filed at 10/24/2017 1000 Gross per 24 hour  Intake 4295.46 ml  Output 4255 ml  Net 40.46 ml   A:   Deteriorating renal failure  Hypokalemia/hypervolemic P:   Continue to follow urine output, BMP Replace electrolytes as indicated Avoid nephrotoxic agents, ensure adequate renal perfusion Lasix per heart failure team Renal consult called 2/14, patient is not a dialysis candidate BMET in AM  GASTROINTESTINAL A:   No acute issues P:   Tube feeding per nutrition Pepcid IV for ulcer prophylaxis  HEMATOLOGIC Recent Labs    10/23/17 1557 10/24/17 0623  HGB 9.2* 9.2*   Lab Results  Component Value Date   INR 1.33 09/25/2017   INR 1.10 07/05/2016   INR 0.98 10/08/2012   A:   Anemia, thrombocytopenia - blood loss via chest tube Concern for hemolytic anemia with impella, note high LDH P:  PRBCs per CHF service, goal Hb 8 & above Transfused 4 units of packed cells 10/14/2017 Follow CBC and transfusion per cardiology service with 2 units of packed cells on 10/15/2017 Remains on heparin  INFECTIOUS A:    Strep Pneumonia Bacteremia - 1/21 - completed 10 ds of rocephin Severe CAP HCAP? >  No evidence thus far based on BAL from 2/1 Fever 1/30 - new, considering RLL, sinusitis with NGT, multiple lines Parainfluenza virus pneumonia Persistent right pleural effusion appears complex and could be empyema despite right chest tube 10/19/2017 T-max 101 during the night.  He has been followed by infectious disease no cultures are positive at this time. P:   D/C all abx and observe per ID Infectious disease consult 10/14/2017 appreciated Suspect that yeast in BAL is a contaminant, empiric   ENDOCRINE CBG (last 3)  Recent Labs    10/23/17 2336 10/24/17 0349 10/24/17  0759  GLUCAP 117* 117* 100*   A:   Hyperglycemia well controlled P:   Sliding scale insulin per protocol  NEUROLOGIC A:   Sedation Need / Mechanical Ventilation  Suspected seizure 10/14/2017 neurology consult Suspect he may be secondary to febrile state. EEG performed P:   RASS goal: -1  Sedation protocol: -1> continue precedex at lower dose, PRN fentanyl, versed  EEG with no seizure activity noted.  Neurology has signed off as of 10/16/2017 10/20/2017 awake and follows commands no further seizure activity.  Much more awake  FAMILY  - Updates: Family was communicated to that there is little else that can be offered.  They are fixated on the tracheostomy (which it was relayed to them that this is not an option, patient is not a candidate as discussed above, they were informed that ETT>2wks can result in tracheal rupture and bleed but continue to want to keep him intubated aware of the risks) and cardioversion.  Dr. Aundra Dubin from cardiology informed them that he will cardiovert patient in AM post echo but that this is the last intervention that will be possible for the patient and the last that is available at this point.  Dr. Marval Regal from nephrology will see patient but feels is not a dialysis candidate.  ID feels that the course of abx  if complete and will observe off abx.  At this point, post cardioversion in AM, if family continues to wish for aggressive measures then the next step is involvement of ethics and likely transition to comfort care.  I do not feel that the family has complete grasp of the severity of the patient illness and all treatments being provided are futile.  The patient is critically ill with multiple organ systems failure and requires high complexity decision making for assessment and support, frequent evaluation and titration of therapies, application of advanced monitoring technologies and extensive interpretation of multiple databases.   Critical Care Time devoted to patient care services described in this note is  45  Minutes. This time reflects time of care of this signee Dr Jennet Maduro. This critical care time does not reflect procedure time, or teaching time or supervisory time of PA/NP/Med student/Med Resident etc but could involve care discussion time.  Rush Farmer, M.D. [facility] Pulmonary/Critical Care Medicine. Pager: [phone]. After hours pager: [phone].

## 2017-10-25 LAB — CULTURE, BLOOD (ROUTINE X 2)
Culture: NO GROWTH
Culture: NO GROWTH
Special Requests: ADEQUATE
Special Requests: ADEQUATE

## 2017-10-25 LAB — CBC WITH DIFFERENTIAL/PLATELET
BASOS PCT: 0 %
Basophils Absolute: 0 10*3/uL (ref 0.0–0.1)
Eosinophils Absolute: 0.1 10*3/uL (ref 0.0–0.7)
Eosinophils Relative: 1 %
HEMATOCRIT: 29.7 % — AB (ref 39.0–52.0)
Hemoglobin: 9.1 g/dL — ABNORMAL LOW (ref 13.0–17.0)
Lymphocytes Relative: 8 %
Lymphs Abs: 0.8 10*3/uL (ref 0.7–4.0)
MCH: 29.9 pg (ref 26.0–34.0)
MCHC: 30.6 g/dL (ref 30.0–36.0)
MCV: 97.7 fL (ref 78.0–100.0)
MONO ABS: 0.1 10*3/uL (ref 0.1–1.0)
Monocytes Relative: 1 %
NEUTROS ABS: 8.6 10*3/uL — AB (ref 1.7–7.7)
Neutrophils Relative %: 90 %
PLATELETS: 422 10*3/uL — AB (ref 150–400)
RBC: 3.04 MIL/uL — ABNORMAL LOW (ref 4.22–5.81)
RDW: 20.3 % — AB (ref 11.5–15.5)
WBC: 9.6 10*3/uL (ref 4.0–10.5)

## 2017-10-25 LAB — LACTATE DEHYDROGENASE: LDH: 422 U/L — ABNORMAL HIGH (ref 98–192)

## 2017-10-25 LAB — COOXEMETRY PANEL
Carboxyhemoglobin: 1.3 % (ref 0.5–1.5)
Methemoglobin: 1.2 % (ref 0.0–1.5)
O2 Saturation: 64.7 %
Total hemoglobin: 9.4 g/dL — ABNORMAL LOW (ref 12.0–16.0)

## 2017-10-25 LAB — GLUCOSE, CAPILLARY
GLUCOSE-CAPILLARY: 98 mg/dL (ref 65–99)
GLUCOSE-CAPILLARY: 84 mg/dL (ref 65–99)
GLUCOSE-CAPILLARY: 83 mg/dL (ref 65–99)
Glucose-Capillary: 135 mg/dL — ABNORMAL HIGH (ref 65–99)
Glucose-Capillary: 84 mg/dL (ref 65–99)
Glucose-Capillary: 60 mg/dL — ABNORMAL LOW (ref 65–99)

## 2017-10-25 LAB — HEPARIN LEVEL (UNFRACTIONATED)
Heparin Unfractionated: 0.17 IU/mL — ABNORMAL LOW (ref 0.30–0.70)
Heparin Unfractionated: 0.23 IU/mL — ABNORMAL LOW (ref 0.30–0.70)

## 2017-10-25 LAB — BASIC METABOLIC PANEL
ANION GAP: 13 (ref 5–15)
BUN: 109 mg/dL — ABNORMAL HIGH (ref 6–20)
CALCIUM: 7.7 mg/dL — AB (ref 8.9–10.3)
CO2: 22 mmol/L (ref 22–32)
Chloride: 115 mmol/L — ABNORMAL HIGH (ref 101–111)
Creatinine, Ser: 3.65 mg/dL — ABNORMAL HIGH (ref 0.61–1.24)
GFR, EST AFRICAN AMERICAN: 19 mL/min — AB (ref >60)
GFR, EST NON AFRICAN AMERICAN: 16 mL/min — AB (ref >60)
Glucose, Bld: 94 mg/dL (ref 65–99)
Potassium: 3.6 mmol/L (ref 3.5–5.1)
Sodium: 150 mmol/L — ABNORMAL HIGH (ref 135–145)

## 2017-10-25 LAB — MAGNESIUM: Magnesium: 2.1 mg/dL (ref 1.7–2.4)

## 2017-10-25 MED ORDER — SODIUM CHLORIDE 0.9% FLUSH
3.0000 mL | Freq: Two times a day (BID) | INTRAVENOUS | Status: DC
  Administered 2017-10-25 – 2017-10-27 (×4): 3 mL via INTRAVENOUS

## 2017-10-25 MED ORDER — HYDROMORPHONE HCL 1 MG/ML IJ SOLN
1.0000 mg | INTRAMUSCULAR | Status: DC | PRN
  Administered 2017-10-26 – 2017-10-27 (×3): 2 mg via INTRAVENOUS
  Filled 2017-10-25 (×3): qty 2

## 2017-10-25 MED ORDER — FAMOTIDINE 20 MG IN NS 100 ML IVPB
20.0000 mg | INTRAVENOUS | Status: DC
  Administered 2017-10-25 – 2017-10-27 (×3): 20 mg via INTRAVENOUS
  Filled 2017-10-25 (×4): qty 100

## 2017-10-25 MED ORDER — SODIUM CHLORIDE 0.9 % IV SOLN: 250.0000 mL | INTRAVENOUS | Status: DC

## 2017-10-25 MED ORDER — FENTANYL CITRATE (PF) 100 MCG/2ML IJ SOLN
INTRAMUSCULAR | Status: AC
  Administered 2017-10-25: 50 ug
  Filled 2017-10-25: qty 2

## 2017-10-25 MED ORDER — SODIUM CHLORIDE 0.9% FLUSH: 3.0000 mL | INTRAVENOUS | Status: DC | PRN

## 2017-10-25 MED ORDER — GLYCOPYRROLATE 0.2 MG/ML IJ SOLN
0.2000 mg | INTRAMUSCULAR | Status: DC
  Administered 2017-10-25 – 2017-10-27 (×13): 0.2 mg via INTRAVENOUS
  Filled 2017-10-25 (×14): qty 1

## 2017-10-25 NOTE — Progress Notes (Signed)
Patient ID: Bryan Harmon, male   DOB: 04-04-1951, 67 y.o.   MRN: [id number]     Advanced Heart Failure Rounding Note  Primary Cardiologist: Aundra Dubin   Subjective:    Impella 5.0 placed in the OR 1/30.  Chest tube placed on right 1/31.   2/4 ID consulted for ongoing fever. 2/4 Neuro consulted for ?seizure. CT of head with no acute findings. EEG without seizure activity.  Echo for Impella placement on 2/5 showed a small vegetation on a mitral valve chord.   2/5 CT of chest/abd/pelvis: Worsening pulmonary infiltrates.  Necrotizing PNA on right with complex pleural effusion. Also with aggressive left base PNA.    2/6 Impella removed due to blockage => suspect small vegetation on MV chord was drawn into the Impella, stopping the device.   Remains on amio 30 mg/hr, milrinone 0.5 mcg/kg/min, norepinephrine 15.   - Lasix gtt going at 12 mg/hr. Coox 64.7%.   Intubated and sedated. Intermittently follows commands.   ID saw again 10/23/17, ? Drug fever => abx stopped 10/23/17.  Tmax 102 this am.   Serosanguinous drainage from right chest tube.   Creatinine up to 3.65. Negative 1.5 L.   Hgb 9.1 after 1 unit 08/22/18.   CVP 10 +.  Echo: Severe LV dilation, EF 15-20%, severely dilated RV with severely decreased RV systolic function, moderate-severe Bryan, moderate-severe TR, cannot rule out LV apical thrombus.  Repeat echo with Definity showed definite LV apical thrombus.   TEE (1/28): EF 10-15%, diffuse hypokinesis, small LV apical thrombus, mildly dilated RV with moderately decreased systolic function, no LAA thrombus.    Objective:   Weight Range: 234 lb (106.1 kg) Body mass index is 33.58 kg/m.   Vital Signs:   Temp:  [96.3 F (35.7 C)-102 F (38.9 C)] 102 F (38.9 C) (02/15 0900) Pulse Rate:  [25-142] 66 (02/15 0900) Resp:  [11-33] 16 (02/15 0900) BP: (83-114)/(61-100) 92/80 (02/15 0900) SpO2:  [91 %-100 %] 97 % (02/15 0900) Arterial Line BP: (94-149)/(59-89) 101/65 (02/15  0900) FiO2 (%):  [40 %-60 %] 50 % (02/15 0815) Weight:  [234 lb (106.1 kg)] 234 lb (106.1 kg) (02/15 0400) Last BM Date: 10/24/17  Weight change: Filed Weights   10/23/17 0500 10/24/17 0500 10/25/17 0400  Weight: 227 lb (103 kg) 225 lb (102.1 kg) 234 lb (106.1 kg)    Intake/Output:   Intake/Output Summary (Last 24 hours) at 10/25/2017 0920 Last data filed at 10/25/2017 0900 Gross per 24 hour  Intake 3339.79 ml  Output 5160 ml  Net -1820.21 ml      Physical Exam   CVP 10+  General: Intubated/sedated.  HEENT: + ETT Neck: Supple. JVP 10 cm +. Carotids 2+ bilat; no bruits. No thyromegaly or nodule noted. Cor: PMI lateral.. Tachy, irregular.  Lungs: Diminished basilar sounds Abdomen: Soft, non-tender, non-distended, no HSM. No bruits or masses. +BS  Extremities: No cyanosis, clubbing, or rash. 2+ edema into lower legs and forearms.  Neuro: Intubated/sedated   Telemetry   A fib 130s, personally reviewed.   EKG    No new tracings.    Labs    CBC Recent Labs    10/24/17 0623 10/24/17 1645 10/25/17 0312  WBC 9.4  --  9.6  NEUTROABS 8.3*  --  8.6*  HGB 9.2* 9.5* 9.1*  HCT 30.1* 28.0* 29.7*  MCV 97.4  --  97.7  PLT 433*  --  751*   Basic Metabolic Panel Recent Labs    10/23/17 0435  10/24/17  0421 10/24/17 1645 10/25/17 0312  NA 148*   < > 149* 151* 150*  K 3.7   < > 3.6 4.2 3.6  CL 116*   < > 116* 113* 115*  CO2 20*  --  23  --  22  GLUCOSE 117*   < > 124* 148* 94  BUN 102*   < > 107* 102* 109*  CREATININE 3.35*   < > 3.55* 3.60* 3.65*  CALCIUM 7.3*  --  7.4*  --  7.7*  MG 2.2  --  2.2  --  2.1  PHOS 4.3  --  4.9*  --   --    < > = values in this interval not displayed.   Liver Function Tests Recent Labs    10/23/17 0913  AST 35  ALT 32  ALKPHOS 85  BILITOT 1.0  PROT 4.7*  ALBUMIN 1.5*   No results for input(s): LIPASE, AMYLASE in the last 72 hours. Cardiac Enzymes No results for input(s): CKTOTAL, CKMB, CKMBINDEX, TROPONINI in the last  72 hours.  BNP: BNP (last 3 results) Recent Labs    12/13/16 0949 02/11/17 1049 09/25/2017 1218  BNP 369.2* 562.7* 2,485.9*    ProBNP (last 3 results) No results for input(s): PROBNP in the last 8760 hours.   D-Dimer No results for input(s): DDIMER in the last 72 hours. Hemoglobin A1C No results for input(s): HGBA1C in the last 72 hours. Fasting Lipid Panel No results for input(s): CHOL, HDL, LDLCALC, TRIG, CHOLHDL, LDLDIRECT in the last 72 hours. Thyroid Function Tests No results for input(s): TSH, T4TOTAL, T3FREE, THYROIDAB in the last 72 hours.  Invalid input(s): FREET3  Other results:   Imaging    No results found.   Medications:     Scheduled Medications: . chlorhexidine gluconate (MEDLINE KIT)  15 mL Mouth Rinse BID  . Chlorhexidine Gluconate Cloth  6 each Topical Daily  . feeding supplement (PRO-STAT SUGAR FREE 64)  30 mL Per Tube BID  . free water  200 mL Per Tube Q6H  . Gerhardt's butt cream   Topical BID  . insulin aspart  2-6 Units Subcutaneous Q4H  . insulin glargine  12 Units Subcutaneous BID  . mouth rinse  15 mL Mouth Rinse 10 times per day  . sennosides  5 mL Oral Daily  . sodium chloride flush  3 mL Intravenous Q12H  . sodium chloride flush  3 mL Intravenous Q12H    Infusions: . sodium chloride 20 mL/hr at 10/15/17 0608  . sodium chloride    . amiodarone 30 mg/hr (10/25/17 0900)  . dexmedetomidine (PRECEDEX) IV infusion for high rates 1.199 mcg/kg/hr (10/25/17 0900)  . famotidine (PEPCID) IV Stopped (10/24/17 0908)  . feeding supplement (VITAL 1.5 CAL) Stopped (10/25/17 0000)  . furosemide (LASIX) infusion 12 mg/hr (10/25/17 0900)  . heparin 1,750 Units/hr (10/25/17 0900)  . milrinone 0.5 mcg/kg/min (10/25/17 0900)  . norepinephrine (LEVOPHED) Adult infusion 15.04 mcg/min (10/25/17 0900)    PRN Medications: sodium chloride, acetaminophen (TYLENOL) oral liquid 160 mg/5 mL, albuterol, magic mouthwash, midazolam, morphine injection,  ondansetron (ZOFRAN) IV, sodium chloride flush, sodium chloride flush, sodium chloride flush    Patient Profile  Bryan Harmon is a 67 year old with a history of PAF S/P DC-CV 11/2016, s/p bilateral inguinal hernia repair 11/2017, PVCs, HTN, NICM, chronic systolic heart failure.   Sent from Urgent Care with A fib RVR. Acutely SOB on arrival   Assessment/Plan   1. Acute hypoxemic respiratory failure: RLL PNA, Strep  pneumoniae in blood cultures. Respiratory cultures with parainfluenzae virus. Also acute/chronic systolic CHF.Now intubated s/p Impella 5.0 placement, multifocal PNA on CXR and CT chest. Remains critically ill.  Antibiotics broadened from ceftriaxone to vancomycin/meropenem on 1/30. BAL with Candida albicans, anidulafungin started 2/2.  Necrotizing PNA with complex pleural effusion on right on 2/5 CT, anti-microbials changed to ceftaroline and Flagyl on 2/5. Has had PSV trials daily.  ID concerned about possible drug fever, stopped antibiotics on 2/13.  - Right chest tube placed 1/31, still with serosanguineous drainage. - Suspect that necrosis in right lung + loculated effusion would require surgery to quell infection.  However, he is too unstable for this.  No change.  2.Acute on chronic systolic CHF-> cardiogenic shock : Nonischemic cardiomyopathy. 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 Etiology of his CHF is not clear =>no definite inciting event. He has a history of HTN but doubt this was the only trigger. 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 This would ideally be confirmed by cMRI, but he has not wanted an MRI (concerned about side effects). He also has frequent PVCs, 21% total on last holter in 4/18 which is a risk for fall in EF.No family history of CMP. Cannot rule out viral myocarditis. SPEP negative. With medical management, he initially felt a lot better. However, he quit all his  meds in early 2018 with recurrence of NYHA III symptoms and onset of atrial fibrillation.He had TEE-DCCV in 3/18.TEE showed that EF remained25%. He quit his meds again in 4/18 and apparently did not restart them when I asked him to in 6/18. No meds probably since 4/18. Echo this admission with EF 15-20%, severe RV dysfunction, LV apical thrombus. RV is hypokinetic.  Attempted DCCV on 1/28 failed likely due to pressors/inotropes.  [place] 1/29 with low cardiac output => Impella 5.0 placed 1/30. On 2/6, Impella removed due to pump stop (suspect vegetation from MV chord sucked into Impella). Suspect combination of septic/vasogenic shock and cardiogenic shock.  Luiz Blare out on 2/10.   Coox 69% on milrinone 0.5 mcg/kg/min, norepinephrine 12.   - Creatinine trending up to 3.6. CVP ~ 10 +. Continue Lasix gtt at 12 mg/hr.  - Stable co-ox, continue current milrinone and norepinephrine.   - End point still difficult to envision => would not be LVAD candidate with renal failure and RV failure.   3. Atrial fibrillation:Persistent, now with RVR. HR up to 140sinitially, now 110s on amiodarone gtt at30. Not sure how long he has been in atrial fibrillation with RVR, but this may have triggered his CHF and AKI due to worsening of cardiac output.  -Heparin gtt ongoing. - Continue amiodarone gtt at 30 mg/hr.  - Failed TEE-guided DCCV on 1/28 in setting of vasoactive meds.  His family has really wanted another cardioversion attempt before deciding on comfort care.  Will do DCCV today. Limited echo yesterday showed resolution of LV thrombus.  - Tube feeds held for bedside DCCV this am. If unsuccessful will need to consider comfort care, as there is nothing further to offer him.  4. AKI: Creatinine 1.08 in 6/18, he has not been on any meds. Suspect cardiorenal syndrome with afib/RVR and fall in cardiac output.He was initially hyperkalemic and acidotic.Today, BUN/creatinine continue to rise despite adequate cardiac output  by co-ox. - Supported CO with milrinone.  - Continue Lasix gtt with elevated CVP. No change.  - Worsening renal function despite support is concerning, he is not HD  candidate.  5. HU:OHFGB pneumo PNA as well as parainfluenza virus.CT chest 1/29 with multifocal PNA. On  1/30 to vancomycin/meropenem, then anidulafungin added.  ID has seen. Now on Teflaro and metronidazole. CT of chest/abd/pelvis 2/5 concerning for necrotizing right pneumonia with surrounding complex large pleural effusion.  He has a chest tube with ongoing serosanguineous drainage. 2/5 showed a small vegetation on a mitral valve chord.  Repeat ECHO on 2/6 did not show vegetation. Assumed it was pulled into Impella.  He is now off [employer].  Febrile to 102 again last night. - ID saw 10/23/17, held abx for ?drug fever.  Tmax 102.  - Necrosis in right lung + loculated effusion would ideally require surgery to effectively treat infection, however do not think he would survive surgery at this point.    6. Elevated LFTs: Suspect shock liver type picture. - AST/ALT > 1000 at admission but have trended down steadily. No change 7. LV thrombus: Noted on TTE and TEE, small.  - Remains on heparin.   - No LV thrombus noted on limited echo 10/24/17. 8. Hypernatremia: Mild, Na 150 today.   9. Malnutrition: Tube feeds held for DCCV.  10. Anemia: Significant blood loss via chest tube and mouth.   Received 2UPRBCs 2/3, 2/4 5 uPRBCs, 2/5 2UPRBCs. 2/6 2U PRBCs. 1 unit PRBCs 2/9, 1 unit PRBCs 2/13.  Hgb stable today.  11. Suspected Deep Tissue Injury: Sacrum/R buttock/Mid back . Continue to repostion R/L. WOC consult  Appreciated. 12. Seizure: Neurology appreciated. CT head negative. EEG no seizure noted. No change.   Pt critically ill and in multisystem organ failure. Family has had very poor insight into his prognosis. Plan for DCCV this am and then likely comfort care. He is not candidate for renal replacement therapy, VAD, transplant, or surgery.    Prognosis grim.   Shirley Friar, PA-C  10/25/2017 9:20 AM  Advanced Heart Failure Team Pager [phone] (M-F; 7a - 4p)  Please contact [facility] for night-coverage after hours (4p -7a ) and weekends on [email or web address]  Patient seen with PA, agree with the above note.  His clinical situation continues to slowly deteriorate.  Ongoing fevers to 102 now off abx for ?drug fever.  BUN/creatinine gradually rising.  Good UOP yesterday on Lasix gtt, co-ox 65% on current support (milrinone and norepinephrine) and CVP 10-11.    On exam, he is intubated, sedated, and frail.  JVP 8-9 cm.  1+ edema of lower legs and forearms.  Decreased BS at bases bilaterally.    I attempted DCCV again this morning.  I shocked him 3 times at 200 J with sternal pressure.  He remained in atrial fibrillation with RVR.   I had another discussion with the patient's wife and son today along with Dr. Tommy Medal.   I told them that I do not have anything else that I can offer to make him better.  I think it is time to consider comfort care.  They are going to think about this.  I have asked the palliative care service to come by and discussion what to expect with extubation and withdrawal of aggressive care and how we can keep him comfortable through this.   CRITICAL CARE Performed by: Loralie Champagne  Total critical care time: 40 minutes  Critical care time was exclusive of separately billable procedures and treating other patients.  Critical care was necessary to treat or prevent imminent or life-threatening deterioration.  Critical care was time spent personally by me on  the following activities: development of treatment plan with patient and/or surrogate as well as nursing, discussions with consultants, evaluation of patient's response to treatment, examination of patient, obtaining history from patient or surrogate, ordering and performing treatments and interventions, ordering and review of laboratory studies, ordering  and review of radiographic studies, pulse oximetry and re-evaluation of patient's condition.  Loralie Champagne 10/25/2017 11:06 AM

## 2017-10-25 NOTE — Progress Notes (Signed)
Patient extubated with a one way extubation with the understanding of not being intubated again. RN and family at bedside. Patient comfortable at this time.

## 2017-10-25 NOTE — Consult Note (Signed)
Consultation Note Date: 10/25/2017   Patient Name: Bryan Harmon  DOB: May 29, 1951  MRN: [id number]  Age / Sex: 67 y.o., male  PCP: Wendie Agreste, MD Referring Physician: Larey Dresser, MD  Reason for Consultation: Establishing goals of care  HPI/Patient Profile: 67 y.o. male  with past medical history of HTN, paroxysmal atrial fib, PVCs, NICM, systolic heart failure (he has been off of his medications against medical advise multiple times), bilat hernia repair admitted on 09/14/2017 with SOB and AF RVR. Hospitalization has been complicated by continued decline with multiorgan failure and vent dependence and he has not has any improvement.   Clinical Assessment and Goals of Care: I met today at Mr. Bryan Harmon bedside with his family after being called and requested to visit with them today. After review of notes and the struggles with goals of care I went to visit with family. Discussed with Dr. Vaughan Harmon regarding plans for one way extubation but that family resistant to utilizing medications for comfort.   I met with wife, son, and 2 other family members. They seemed anxious and distracted and difficult to have discussion. They did not want to speak in front of patient but did not want to leave bedside for conversation. Spoke with wife and son as I was able at door of room. We discussed extubation and they understand the plan for extubation and they are very hopeful that he does well. Wife explains that she is hopeful but also knows that if his breathing gets worse that he will pass as there is nothing else that can be done. We talked about after extubation and if his breathing gets worse. I explained to them about why we use opioids with worsening breathing because with respiratory distress this can effect his heart and cause further decline and discomfort. Son is very resistant and avoidant of me and I do  not think that he trusts anything I am saying. He stands by that his father struggling to breathe is him fighting. Educated that I would place medication order just in case it is needed. Offered emotional support to wife but she is very overwhelmed.   Primary Decision Maker NEXT OF KIN wife    SUMMARY OF RECOMMENDATIONS   - Family very resistant to conversation to me and to any interventions that could provide comfort  Code Status/Advance Care Planning:  DNR - I did not discuss   Symptom Management:   Added prn dilaudid but family will likely be resistant to use.   Adding scheduled Robinul with fluid status and CHF. To manage secretions post extubation.   Palliative Prophylaxis:   Delirium Protocol, Frequent Pain Assessment, Oral Care and Turn Reposition  Additional Recommendations (Limitations, Scope, Preferences):  Full Scope Treatment - limitations have been placed  Psycho-social/Spiritual:   Desire for further Chaplaincy support:no  Additional Recommendations: Compassionate Wean Education and Grief/Bereavement Support  Prognosis:   Hours - Days  Discharge Planning: Anticipated Hospital Death      Primary Diagnoses: Present on Admission: . Acute on  chronic systolic heart failure ([place])   I have reviewed the medical record, interviewed the patient and family, and examined the patient. The following aspects are pertinent.  Past Medical History:  Diagnosis Date  . Hypertension   . Inguinal hernia, bilateral    Social History   Socioeconomic History  . Marital status: Married    Spouse name: None  . Number of children: 1  . Years of education: None  . Highest education level: None  Social Needs  . Financial resource strain: None  . Food insecurity - worry: None  . Food insecurity - inability: None  . Transportation needs - medical: None  . Transportation needs - non-medical: None  Occupational History  . Occupation: physical Training and development officer:  [employer]  Tobacco Use  . Smoking status: Former Smoker    Last attempt to quit: 09/10/1996    Years since quitting: 21.1  . Smokeless tobacco: Never Used  Substance and Sexual Activity  . Alcohol use: Yes    Comment: occasional   . Drug use: No  . Sexual activity: None  Other Topics Concern  . None  Social History Narrative   Married.  Son lives in [place]. Ultra-marathoner   Family History  Problem Relation Age of Onset  . Hypertension Maternal Grandmother   . Cancer Neg Hx   . Heart disease Neg Hx    Scheduled Meds: . chlorhexidine gluconate (MEDLINE KIT)  15 mL Mouth Rinse BID  . Chlorhexidine Gluconate Cloth  6 each Topical Daily  . famotidine  20 mg Intravenous Q24H  . feeding supplement (PRO-STAT SUGAR FREE 64)  30 mL Per Tube BID  . free water  200 mL Per Tube Q6H  . Gerhardt's butt cream   Topical BID  . glycopyrrolate  0.2 mg Intravenous Q4H  . insulin aspart  2-6 Units Subcutaneous Q4H  . insulin glargine  12 Units Subcutaneous BID  . mouth rinse  15 mL Mouth Rinse 10 times per day  . sennosides  5 mL Oral Daily  . sodium chloride flush  3 mL Intravenous Q12H  . sodium chloride flush  3 mL Intravenous Q12H   Continuous Infusions: . sodium chloride 250 mL (10/25/17 1132)  . sodium chloride    . amiodarone 30 mg/hr (10/25/17 1500)  . dexmedetomidine (PRECEDEX) IV infusion for high rates 1.199 mcg/kg/hr (10/25/17 1500)  . feeding supplement (VITAL 1.5 CAL) 1,000 mL (10/25/17 1500)  . furosemide (LASIX) infusion 12 mg/hr (10/25/17 1500)  . heparin 1,750 Units/hr (10/25/17 1500)  . milrinone 0.5 mcg/kg/min (10/25/17 1500)  . norepinephrine (LEVOPHED) Adult infusion 15.04 mcg/min (10/25/17 1500)   PRN Meds:.sodium chloride, acetaminophen (TYLENOL) oral liquid 160 mg/5 mL, albuterol, HYDROmorphone (DILAUDID) injection, magic mouthwash, midazolam, ondansetron (ZOFRAN) IV, sodium chloride flush, sodium chloride flush, sodium chloride flush Allergies  Allergen  Reactions  . Fentanyl And Related Other (See Comments)    Patients eyes roll in the back of his head and patient has seizure like activity   Review of Systems  Unable to perform ROS   Physical Exam  Constitutional: He appears well-developed. He appears toxic. He is intubated.  HENT:  Head: Normocephalic and atraumatic.  Cardiovascular: An irregularly irregular rhythm present. Tachycardia present.  Pulmonary/Chest: He is intubated.  Neurological: He is unresponsive.  Nursing note and vitals reviewed.   Vital Signs: BP 112/72   Pulse (!) 132   Temp (!) 102 F (38.9 C)   Resp 15   Ht '5\' 10"'$  (1.778  m)   Wt 106.1 kg (234 lb)   SpO2 94%   BMI 33.58 kg/m  Pain Assessment: CPOT   Pain Score: Asleep   SpO2: SpO2: 94 % O2 Device:SpO2: 94 % O2 Flow Rate: .O2 Flow Rate (L/min): 6 L/min  IO: Intake/output summary:   Intake/Output Summary (Last 24 hours) at 10/25/2017 1604 Last data filed at 10/25/2017 1500 Gross per 24 hour  Intake 3317.25 ml  Output 5020 ml  Net -1702.75 ml    LBM: Last BM Date: 10/24/17 Baseline Weight: Weight: 81.6 kg (180 lb) Most recent weight: Weight: 106.1 kg (234 lb)     Palliative Assessment/Data: 20%     Time Total: 35 min  Greater than 50%  of this time was spent counseling and coordinating care related to the above assessment and plan.  Signed by: Vinie Sill, NP Palliative Medicine Team Pager # [phone] (M-F 8a-5p) Team Phone # [phone] (Nights/Weekends)

## 2017-10-25 NOTE — Progress Notes (Signed)
Referral made to [facility].  Spoke with [facility].  Referral [phone].

## 2017-10-25 NOTE — Progress Notes (Signed)
PULMONARY / CRITICAL CARE MEDICINE   Name: Bryan Harmon MRN: [id number] DOB: May 24, 1951    ADMISSION DATE:  09/25/2017  REFERRING MD:  Nils Pyle  CHIEF COMPLAINT:  Fatigue  HISTORY OF PRESENT ILLNESS:   67 y/o male with non-ischemic cardiomyopathy and afib admitted with decompensated heart failure.  Required Impella, vent.    Lines/ Tubes 1/30 L IJ CVL > 1/31 1/30 R IJ swan > 1/30 R sub clav impella > 10/16/2017 1/30 L radial arterial line > 10/15/2017 1/30 ETT >  1/31 L Wilder TLC >  10/15/2017 right femoral arterial line placed>>  Cultures: 1/21 Blood >> Strep Pneumoniae >> pan sens 1/21 Resp Viral Panel >> + Parainfluenza Virus 3 1/28 blood > ng 1/30 blood > ng 1/31 resp culture > ng 2/1 BAL > candida 10/15/2017 blood cultures x2>> 10/15/2017 tracheal aspirate>> rare gram-negative rods few Candida albicans 10/15/2017 urine culture>> negative  ABX: Infectious disease consult 10/14/2017 Rocephin 1/21 >> 1/29 Doxy 1/21 >> 1/23 Vanc 1/30 >> 10/14/2017 Mero 1/30 >> 10/15/2017 Eraxis 2/2 >> off Teflaro 10/15/2017>>2/14 Flagyl 10/15/2017>>2/14  Studies: 10/01/2017>> Echo EF 15-20%, Apex ? PAP muscle vs. Thrombus, LV severely dilated, moderate concentric hypertrophy, systolic function normal, unable to evaluate LV diastolic function due to atrial fibrillation. + moderate spontaneous echo contrast, indicative of stasis Impression 1/23 Echo > Severely dilated LV with severe LV dysfunction globally with EF 15-20%. Severely dialted RV with severe RV dysfunction. Moderate to severe MR with ERO 0.33cm2 and MR volume 62m. Moderate tosevere TR with moderate pulmonary HTN. Moderately thickened and calcified AV leaflets with mild MR, mildly dilated aortic root, massive biatrial enlargement. Cannot rule out LV thrombus. Thereis significant spontaneous echo contrast in LV c/w sluggish blood flow. The right ventricular systolic pressure was increasedconsistent with moderate pulmonary  hypertension. 1/29 CT chest > dense bilateral lower lobe consolidation R>L 1/30 TEE >> LVEF 20-25%, impella in place, poor overall contractility but LV improved 10/25/2017 bedside cardioversion attempted without success.   SUBJECTIVE:   Failed cardioversion.  Cardiology has explained that we have reached maximum interventions and no further interventions can be done.  VITAL SIGNS: BP 93/75   Pulse (!) 132   Temp (!) 102 F (38.9 C)   Resp 14   Ht _0  (1.778 m)   Wt 106.1 kg (234 lb)   SpO2 94%   BMI 33.58 kg/m   HEMODYNAMICS: CVP:  [5 mmHg-19 mmHg] 8 mmHg  VENTILATOR SETTINGS: Vent Mode: PCV FiO2 (%):  [40 %-60 %] 50 % Set Rate:  [10 bmp] 10 bmp PEEP:  [5 cmH20] 5 cmH20 Plateau Pressure:  [17 cmH20-18 cmH20] 17 cmH20  INTAKE / OUTPUT: I/O last 3 completed shifts: In: 5386.1 [I.V.:3316.1; NG/GT:2070] Out: 7260 [Urine:6620; Emesis/NG output:60; Chest Tube:580]  PHYSICAL EXAMINATION: General: Frail, cachectic, male on full vent support. HEENT: Endotracheal tube to vent, feeding tube with tube feeds PSY: Dull effect Neuro: Currently sedated CV: Sounds are regular regular atrial fib with trigger response of 130 bpm failed cardioversion on 10/26/2007 PULM: Decreased breath sounds throughout GBZ:JIRC non-tender, bsx4 active  Extremities: warm/dry, 2+ edema  Skin: Multiple areas of breakdown at pressure points.   LABS:  BMET Recent Labs  Lab 10/23/17 0435  10/24/17 0421 10/24/17 1645 10/25/17 0312  NA 148*   < > 149* 151* 150*  K 3.7   < > 3.6 4.2 3.6  CL 116*   < > 116* 113* 115*  CO2 20*  --  23  --  22  BUN 102*   < > 107* 102* 109*  CREATININE 3.35*   < > 3.55* 3.60* 3.65*  GLUCOSE 117*   < > 124* 148* 94   < > = values in this interval not displayed.   Electrolytes Recent Labs  Lab 10/21/17 0354  10/23/17 0435 10/24/17 0421 10/25/17 0312  CALCIUM 7.5*   < > 7.3* 7.4* 7.7*  MG 2.2   < > 2.2 2.2 2.1  PHOS 5.2*  --  4.3 4.9*  --    < > = values  in this interval not displayed.   CBC Recent Labs  Lab 10/23/17 0435  10/24/17 0623 10/24/17 1645 10/25/17 0312  WBC 7.4  --  9.4  --  9.6  HGB 7.7*   < > 9.2* 9.5* 9.1*  HCT 25.0*   < > 30.1* 28.0* 29.7*  PLT 323  --  433*  --  422*   < > = values in this interval not displayed.   Coag's No results for input(s): APTT, INR in the last 168 hours. Sepsis Markers No results for input(s): LATICACIDVEN, PROCALCITON, O2SATVEN in the last 168 hours.  ABG Recent Labs  Lab 10/23/17 0419 10/24/17 0104 10/24/17 0456  PHART 7.419 7.274* 7.418  PCO2ART 29.8* 48.9* 34.3  PO2ART 81.0* 89.0 84.0   Liver Enzymes Recent Labs  Lab 10/23/17 0913  AST 35  ALT 32  ALKPHOS 85  BILITOT 1.0  ALBUMIN 1.5*   Cardiac Enzymes No results for input(s): TROPONINI, PROBNP in the last 168 hours.  Glucose Recent Labs  Lab 10/24/17 1144 10/24/17 1624 10/24/17 2012 10/24/17 2313 10/25/17 0317 10/25/17 0741  GLUCAP 125* 136* 98 135* 84 60*   Imaging No results found. DISCUSSION: 67 y/o male with ischemic cardiomyopathy admitted with decompensated systolic heart failure in the setting of strep pneumo pneumonia and bacteremia s/p impella.  He has acute on chronic kidney failure, cardiogenic shock.  10/15/2017 with fever 105.6, pancultured 01/2018, ID consult 10/14/2017. 's overall cardiac function has declined despite all interventions.  Family wants to continue with maximum interventions.  10/18/2016 CODE STATUS changed to a limited with no chest compressions but all other interventions will be continued.  Despite his poor prognosis he is continued to improve therefore possibility of tracheostomy may be undertaken within 72 hours. 10/19/2017 no significant change in status.  I do note there is some having been placed on pressure regulated volume control mode of ventilation which was returned to written orders for pressure control ventilation on 10/19/2017. His hemoglobin is drifting down he may be ready  for follow-up transfusion 10/20/2017 continues to slowly improve.  Massive anasarca has improved despite being a positive I&O.  His Levophed is weaning.  Plan for tracheostomy in the next 24-72 hours 10/25/2017 prognosis extremely poor care is quickly becoming futile.  ASSESSMENT / PLAN:  PULMONARY A: Acute respiratory failure with hypoxemia Pneumococcal CAP - RLL  Acute pulmonary edema Bilateral pleural effusions , R >> L P:   Place back on full vent support Not a candidate for tracheostomy as BUN is very elevated that will result in bleeding and patient will not improve from this thus tracheostomy is a futile procedure which will not be performed.  Family was made aware of this and that tracheostomy is off the table. Titrate O2 for sat of 88-92% May extubate to BiPAP per family's request but concern is airway protection  CARDIOVASCULAR A:  Acute decompensated cardiomyopathy > severe biventricular failure, LVEF ~ 5% S/p Impella 1/30  with removal on 10/16/2017 after failure most likely from vegetation been ingested by the Impella intake. Cardiogenic Shock.  Multiple vasopressor dependent Medical Non-Compliance - stopped taking all meds prior to admit for CHF, ? Long term plan 10/11/2017 right chest tube P:  Volume management, inotrope management as per advanced heart failure team plans, TCTS plans.   Lasix as ordered but renal function is deteriorating Ct levophed which is being weaned also./ milrinone gtt Amiodarone drip, cardioversion in AM per cardiology Prognosis is very poor but family is not quite understanding how ill the patient is and that his chances of survival are very poor CT to water seal, continues to drain. 10/25/2017 for cardioversion per cardiology  10/25/2017 right femoral A-line is now been in for 10 days.   RENAL Lab Results  Component Value Date   CREATININE 3.65 (H) 10/25/2017   CREATININE 3.60 (H) 10/24/2017   CREATININE 3.55 (H) 10/24/2017   CREATININE 1.11  03/25/2012   Recent Labs  Lab 10/24/17 0421 10/24/17 1645 10/25/17 0312  K 3.6 4.2 3.6   Recent Labs  Lab 10/24/17 0421 10/24/17 1645 10/25/17 0312  NA 149* 151* 150*    Intake/Output Summary (Last 24 hours) at 10/25/2017 1032 Last data filed at 10/25/2017 1000 Gross per 24 hour  Intake 3286.79 ml  Output 5150 ml  Net -1863.21 ml   A:   Deteriorating renal failure  Hypokalemia/hypervolemic P:   Continue to follow urine output, BMP Replace electrolytes as indicated Avoid nephrotoxic agents, ensure adequate renal perfusion Lasix per heart failure team Renal consult called 2/14, patient is not a dialysis candidate BMET in AM 10/25/2017 currently on Lasix drip per nephrology.  GASTROINTESTINAL A:   No acute issues P:   Tube feeding per nutrition Pepcid IV for ulcer prophylaxis  HEMATOLOGIC Recent Labs    10/24/17 1645 10/25/17 0312  HGB 9.5* 9.1*   Lab Results  Component Value Date   INR 1.33 09/22/2017   INR 1.10 07/05/2016   INR 0.98 10/08/2012   A:   Anemia, thrombocytopenia - blood loss via chest tube Concern for hemolytic anemia with impella, note high LDH P:  PRBCs per CHF service, goal Hb 8 & above Transfused 4 units of packed cells 10/14/2017 Follow CBC and transfusion per cardiology service with 2 units of packed cells on 10/15/2017 Remains on heparin 10/25/2017 continue to monitor hemoglobin  INFECTIOUS A:   Strep Pneumonia Bacteremia - 1/21 - completed 10 ds of rocephin Severe CAP HCAP? >  No evidence thus far based on BAL from 2/1 Fever 1/30 - new, considering RLL, sinusitis with NGT, multiple lines Parainfluenza virus pneumonia Persistent right pleural effusion appears complex and could be empyema despite right chest tube 10/19/2017 T-max 101 during the night.  He has been followed by infectious disease no cultures are positive at this time. P:   D/C all abx and observe per ID Infectious disease consult 10/14/2017 appreciated Suspect that  yeast in BAL is a contaminant. 10/25/2017 antibiotics pain control by infectious disease he is not on antibiotics at this time due to a drug holiday.  ENDOCRINE CBG (last 3)  Recent Labs    10/24/17 2313 10/25/17 0317 10/25/17 0741  GLUCAP 135* 84 60*   A:   Hyperglycemia well controlled P:   Sliding scale insulin per protocol  NEUROLOGIC A:   Sedation Need / Mechanical Ventilation  Suspected seizure 10/14/2017 neurology consult Suspect he may be secondary to febrile state. EEG performed P:   RASS goal: -1  Sedation protocol: -1> continue precedex at lower dose, PRN fentanyl, versed  EEG with no seizure activity noted.  Neurology has signed off as of 10/16/2017 10/20/2017 awake and follows commands no further seizure activity.  Much more awake 10/25/2017 follows commands at times  FAMILY  - Updates: 10/25/2017 family continues to hold out hope that he will get better.  They have been told by cardiology, pulmonary critical care, nephrology, infectious disease that he is not going to survive this.  Options for tracheostomy, thoracic surgery, hemodialysis have been denied by all services. 10/25/2017 he is to be cardioverted by cardiology. We will continue to have ongoing discussions with family concerning futility of care we are fast approaching the time with ethics committee will need to be involved.    App cct 45 min    Richardson Landry Dayquan Buys ACNP [facility] PCC[facility] Pager [phone] till 1 pm If no answer page [phone] 10/25/2017, 10:33 AM

## 2017-10-25 NOTE — Progress Notes (Signed)
ANTICOAGULATION CONSULT NOTE - Follow Up Consult  Pharmacy Consult:  Heparin Indication: atrial fibrillation and LA thrombus  Allergies  Allergen Reactions  . Fentanyl And Related Other (See Comments)    Patients eyes roll in the back of his head and patient has seizure like activity    Patient Measurements: Height: 5\' 10"  (177.8 cm) Weight: 234 lb (106.1 kg) IBW/kg (Calculated) : 73  Heparin dosing weight = 84 kg  Vital Signs: Temp: 101.8 F (38.8 C) (02/15 1200) Temp Source: Core (02/15 0400) BP: 107/69 (02/15 1200) Pulse Rate: 51 (02/15 1200)  Labs: Recent Labs    10/23/17 0435  10/23/17 1350  10/24/17 0421 10/24/17 0623 10/24/17 1645 10/25/17 0312  HGB 7.7*  --  9.1*   < >  --  9.2* 9.5* 9.1*  HCT 25.0*  --  29.5*   < >  --  30.1* 28.0* 29.7*  PLT 323  --   --   --   --  433*  --  422*  HEPARINUNFRC  --    < > 0.28*  --  0.36  --   --  0.17*  CREATININE 3.35*  --   --    < > 3.55*  --  3.60* 3.65*   < > = values in this interval not displayed.    Estimated Creatinine Clearance: 24.6 mL/min (A) (by C-G formula based on SCr of 3.65 mg/dL (H)).  Assessment: 70 YOM known to pharmacy for heparin management with impella. Impella removed 10/16/17 due to stoppage from small vegetation that got sucked into the catheter. Heparin resumed ~ 4 hours after removal for afib and LA thrombus.   No overt bleeding or complications noted currently.  Heparin level below goal of 0.3 this AM.  Goal of Therapy:  Heparin level 0.3 units/ml Monitor platelets by anticoagulation protocol: Yes   Plan:  Increase heparin gtt to 1750 units/hr. Recheck heparin level in 8 hrs (1500 pm). Daily heparin level and CBC Monitor signs/symptoms of bleeding  Tad Moore, BCPS  Clinical Pharmacist Pager [phone]  10/25/2017 12:50 PM

## 2017-10-25 NOTE — Procedures (Signed)
Electrical Cardioversion Procedure Note Bryan Harmon [id number] August 26, 1951  Procedure: Electrical Cardioversion Indications:  Atrial Fibrillation  Procedure Details Consent: Risks of procedure as well as the alternatives and risks of each were explained to the (patient/caregiver).  Consent for procedure obtained. Time Out: Verified patient identification, verified procedure, site/side was marked, verified correct patient position, special equipment/implants available, medications/allergies/relevent history reviewed, required imaging and test results available.  Performed  Patient placed on cardiac monitor, pulse oximetry, supplemental oxygen as necessary.  Sedation given: Versed and Fentanyl Pacer pads placed anterior and posterior chest.  Cardioverted 3 times using sternal pressure Cardioverted at 200J.  Evaluation Findings: Post procedure EKG shows: Atrial Fibrillation Complications: None Patient did tolerate procedure well.   Bryan Harmon 10/25/2017, 10:58 AM

## 2017-10-25 NOTE — Progress Notes (Signed)
ANTICOAGULATION CONSULT NOTE - Follow Up Consult  Pharmacy Consult:  Heparin Indication: atrial fibrillation and LA thrombus  Allergies  Allergen Reactions  . Fentanyl And Related Other (See Comments)    Patients eyes roll in the back of his head and patient has seizure like activity    Patient Measurements: Height: 5\' 10"  (177.8 cm) Weight: 234 lb (106.1 kg) IBW/kg (Calculated) : 73  Heparin dosing weight = 84 kg  Vital Signs: Temp: 102 F (38.9 C) (02/15 1400) Temp Source: Core (02/15 0400) BP: 96/71 (02/15 1400) Pulse Rate: 128 (02/15 1400)  Labs: Recent Labs    10/23/17 0435  10/24/17 0421 10/24/17 0623 10/24/17 1645 10/25/17 0312 10/25/17 1315  HGB 7.7*   < >  --  9.2* 9.5* 9.1*  --   HCT 25.0*   < >  --  30.1* 28.0* 29.7*  --   PLT 323  --   --  433*  --  422*  --   HEPARINUNFRC  --    < > 0.36  --   --  0.17* 0.23*  CREATININE 3.35*   < > 3.55*  --  3.60* 3.65*  --    < > = values in this interval not displayed.    Estimated Creatinine Clearance: 24.6 mL/min (A) (by C-G formula based on SCr of 3.65 mg/dL (H)).  Assessment: 89 YOM known to pharmacy for heparin management with impella. Impella removed 10/16/17 due to stoppage from small vegetation that got sucked into the catheter. Heparin resumed ~ 4 hours after removal for afib and LA thrombus.   No overt bleeding or complications noted currently.  Heparin level now fairly close to goal of 0.3.  Goal of Therapy:  Heparin level 0.3 units/ml Monitor platelets by anticoagulation protocol: Yes   Plan:  Continue IV heparin at 1750 units/hr. Daily heparin level and CBC Monitor signs/symptoms of bleeding. F/u plans for palliative care, plans for IV heparin.  Tad Moore, BCPS  Clinical Pharmacist Pager [phone]  10/25/2017 2:55 PM

## 2017-10-25 NOTE — Progress Notes (Signed)
Inpatient Diabetes Program Recommendations  AACE/ADA: New Consensus Statement on Inpatient Glycemic Control (2015)  Target Ranges:  Prepandial:   less than 140 mg/dL      Peak postprandial:   less than 180 mg/dL (1-2 hours)      Critically ill patients:  140 - 180 mg/dL   Results for MUNASAR, CONKEY (MRN [id number]) as of 10/25/2017 09:00  Ref. Range 10/23/2017 23:36 10/24/2017 03:49 10/24/2017 07:59 10/24/2017 11:44 10/24/2017 16:24 10/24/2017 20:12  Glucose-Capillary Latest Ref Range: 65 - 99 mg/dL 671 (H) 245 (H) 809 (H) 125 (H) 136 (H) 98   Results for CHARES, PAVELKO (MRN [id number]) as of 10/25/2017 09:00  Ref. Range 10/24/2017 23:13 10/25/2017 03:17 10/25/2017 07:41  Glucose-Capillary Latest Ref Range: 65 - 99 mg/dL 397 (H) 84 60 (L)    NO history of DM noted  Current Insulin Orders: Lantus 12 units BID         Novolog 2-4-6 Q4 hours (ICU Glycemic Control Protocol)     Note tube feeds paused at Midnight last night b/c pt scheduled for Cardioversion this AM.  Hypoglycemic this AM: CBG 60 mg/dl.   MD- May consider reducing Lantus slightly to 8 units BID if patient experiences any more episodes of Hypoglycemia.     --Will follow patient during hospitalization--  Ambrose Finland RN, MSN, CDE Diabetes Coordinator Inpatient Glycemic Control Team Team Pager: [phone] (8a-5p)

## 2017-10-25 NOTE — Progress Notes (Signed)
Patient ID: Bryan Harmon, male   DOB: 01-28-1951, 67 y.o.   MRN: [id number]  After discussion with the involved physicians, the family has elected to extubate [name] with the understanding that he will not be intubated again.  We are going to continue current therapy without escalation.  Patient was hemodynamically unchanged post-extubation with oxygen saturation in the mid-90s on nasal cannula.   I suspect that he will tire out over time and will not survive for a long period.  I have told the family this.   They do not want palliative care involvement.   Marca Ancona 10/25/2017 5:02 PM

## 2017-10-25 NOTE — Progress Notes (Signed)
S: pt failed EC cardioversion and remains in A fib but with reduced pulse O:BP (!) 105/92   Pulse (!) 31   Temp (!) 102 F (38.9 C)   Resp 15   Ht '5\' 10"'$  (1.778 m)   Wt 106.1 kg (234 lb)   SpO2 96%   BMI 33.58 kg/m   Intake/Output Summary (Last 24 hours) at 10/25/2017 1315 Last data filed at 10/25/2017 1300 Gross per 24 hour  Intake 3457.79 ml  Output 5250 ml  Net -1792.21 ml   Intake/Output: I/O last 3 completed shifts: In: 5386.1 [I.V.:3316.1; NG/GT:2070] Out: 7260 [Urine:6620; Emesis/NG output:60; Chest Tube:580]  Intake/Output this shift:  Total I/O In: 851.8 [I.V.:521.8; NG/GT:230; IV Piggyback:100] Out: 1062 [IRSWN:4627; Emesis/NG output:60; Chest Tube:30] Weight change: 4.082 kg (9 lb) Gen: intuba ted CVS: IRR, no rub Resp: scattered rhonchi Abd: +BS, soft Ext: 1+ edema  Recent Labs  Lab 10/19/17 0304  10/20/17 0230  10/21/17 0354  10/21/17 1636 10/22/17 0342 10/22/17 1648 10/23/17 0435 10/23/17 0913 10/23/17 1557 10/24/17 0421 10/24/17 1645 10/25/17 0312  NA 144   < > 144   < > 145   < > 146* 147* 150* 148*  --  149* 149* 151* 150*  K 4.0   < > 3.8   < > 4.0   < > 3.7 3.8 4.3 3.7  --  3.6 3.6 4.2 3.6  CL 111   < > 113*   < > 113*   < > 112* 116*  --  116*  --  114* 116* 113* 115*  CO2 22  --  21*  --  21*  --   --  20*  --  20*  --   --  23  --  22  GLUCOSE 136*   < > 139*   < > 174*   < > 116* 106* 143* 117*  --  145* 124* 148* 94  BUN 80*   < > 93*   < > 94*   < > 81* 98*  --  102*  --  94* 107* 102* 109*  CREATININE 2.64*   < > 2.91*   < > 3.05*   < > 3.10* 3.18*  --  3.35*  --  3.30* 3.55* 3.60* 3.65*  ALBUMIN  --   --   --   --   --   --   --   --   --   --  1.5*  --   --   --   --   CALCIUM 7.3*  --  7.3*  --  7.5*  --   --  7.5*  --  7.3*  --   --  7.4*  --  7.7*  PHOS 3.7  --  4.5  --  5.2*  --   --   --   --  4.3  --   --  4.9*  --   --   AST  --   --   --   --   --   --   --   --   --   --  35  --   --   --   --   ALT  --   --   --   --   --    --   --   --   --   --  32  --   --   --   --    < > =  values in this interval not displayed.   Liver Function Tests: Recent Labs  Lab 10/23/17 0913  AST 35  ALT 32  ALKPHOS 85  BILITOT 1.0  PROT 4.7*  ALBUMIN 1.5*   No results for input(s): LIPASE, AMYLASE in the last 168 hours. No results for input(s): AMMONIA in the last 168 hours. CBC: Recent Labs  Lab 10/21/17 0354  10/22/17 0342  10/23/17 0435  10/24/17 0623 10/24/17 1645 10/25/17 0312  WBC 9.1  --  8.4  --  7.4  --  9.4  --  9.6  NEUTROABS 8.1*  --  7.6  --  6.7  --  8.3*  --  8.6*  HGB 8.9*   < > 8.8*   < > 7.7*   < > 9.2* 9.5* 9.1*  HCT 28.1*   < > 27.8*   < > 25.0*   < > 30.1* 28.0* 29.7*  MCV 97.2  --  97.9  --  98.4  --  97.4  --  97.7  PLT 345  --  327  --  323  --  433*  --  422*   < > = values in this interval not displayed.   Cardiac Enzymes: No results for input(s): CKTOTAL, CKMB, CKMBINDEX, TROPONINI in the last 168 hours. CBG: Recent Labs  Lab 10/24/17 2012 10/24/17 2313 10/25/17 0317 10/25/17 0741 10/25/17 1242  GLUCAP 98 135* 84 60* 84    Iron Studies: No results for input(s): IRON, TIBC, TRANSFERRIN, FERRITIN in the last 72 hours. Studies/Results: Dg Chest Port 1 View  Result Date: 10/24/2017 CLINICAL DATA:  Respiratory distress.  Endotracheal tube placement. EXAM: PORTABLE CHEST 1 VIEW COMPARISON:  Chest radiograph October 23, 2017 and CT chest October 15, 2017 FINDINGS: Endotracheal tube tip projects 2.6 cm above the carina. LEFT subclavian central venous catheter projecting in proximal superior vena cava. Feeding tube past proximal stomach, distal tip not included. RIGHT apical chest tube with side port projecting within the chest wall. Stable cardiomegaly splaying the carina. Probable supposition of osseous structures LEFT costophrenic angle resulting in nodular density. Small RIGHT pleural effusion decreased from prior imaging. Persistent retrocardiac consolidation and strandy densities  RIGHT lung base. No pneumothorax. RIGHT axilla skin staples and surgical clips. IMPRESSION: No apparent change in life-support lines. Stable cardiomegaly with small RIGHT pleural effusion. Persistent retrocardiac consolidation RIGHT lung base probable atelectasis. Electronically Signed   By: Elon Alas M.D.   On: 10/24/2017 01:32   . chlorhexidine gluconate (MEDLINE KIT)  15 mL Mouth Rinse BID  . Chlorhexidine Gluconate Cloth  6 each Topical Daily  . famotidine  20 mg Intravenous Q24H  . feeding supplement (PRO-STAT SUGAR FREE 64)  30 mL Per Tube BID  . free water  200 mL Per Tube Q6H  . Gerhardt's butt cream   Topical BID  . insulin aspart  2-6 Units Subcutaneous Q4H  . insulin glargine  12 Units Subcutaneous BID  . mouth rinse  15 mL Mouth Rinse 10 times per day  . sennosides  5 mL Oral Daily  . sodium chloride flush  3 mL Intravenous Q12H  . sodium chloride flush  3 mL Intravenous Q12H    BMET    Component Value Date/Time   NA 150 (H) 10/25/2017 0312   K 3.6 10/25/2017 0312   CL 115 (H) 10/25/2017 0312   CO2 22 10/25/2017 0312   GLUCOSE 94 10/25/2017 0312   BUN 109 (H) 10/25/2017 0312   CREATININE 3.65 (H) 10/25/2017 3903  CREATININE 1.11 03/25/2012 0840   CALCIUM 7.7 (L) 10/25/2017 0312   GFRNONAA 16 (L) 10/25/2017 0312   GFRAA 19 (L) 10/25/2017 0312   CBC    Component Value Date/Time   WBC 9.6 10/25/2017 0312   RBC 3.04 (L) 10/25/2017 0312   HGB 9.1 (L) 10/25/2017 0312   HGB 14.5 12/03/2016 1341   HCT 29.7 (L) 10/25/2017 0312   HCT 41.2 12/03/2016 1341   PLT 422 (H) 10/25/2017 0312   PLT 298 12/03/2016 1341   MCV 97.7 10/25/2017 0312   MCV 94 12/03/2016 1341   MCH 29.9 10/25/2017 0312   MCHC 30.6 10/25/2017 0312   RDW 20.3 (H) 10/25/2017 0312   RDW 12.7 12/03/2016 1341   LYMPHSABS 0.8 10/25/2017 0312   LYMPHSABS 1.1 12/03/2016 1341   MONOABS 0.1 10/25/2017 0312   EOSABS 0.1 10/25/2017 0312   EOSABS 0.2 12/03/2016 1341   BASOSABS 0.0 10/25/2017 0312    BASOSABS 0.1 12/03/2016 1341     Assessment/Plan:  1. Acute kidney injury: multifactorial:  ATN associated with sepsis and cardiorenal component while recovering from hemodynamically mediated renal injury from his initial atrial fibrillation/RVR with renal hypoperfusion. Fortunately, he is nonoliguric and continues to show some response to furosemide but concern is raised with his rising BUN and creatinine. Dr. Posey Pronto had a lengthy discussion with his son 10/24/17 that renal replacement therapy is not appropriate at this point given its limited (if any) benefit on his overall prognosis. Most certainly, renal replacement therapy would improve his labs and confer him some clearance but his underlying pneumonia/cardiogenic shock has been progressive despite maximal therapy and therefore appears to be beyond medical management at this point.  Continue with IV lasix.  Nothing further to offer so will sign off again.  Please call with any questions or concerns.   2. Respiratory failure: Secondary to necrotizing multifocal pneumococcal pneumonia with associated pleural effusion. Right-sided chest tube in situ. Antibiotics currently on holiday to see if fever improves. 3. Acute on chronic systolic heart failure with cardiogenic shock: On inotropic support with milrinone and norepinephrine. On amiodarone for atrial fibrillation/RVR and continue furosemide drip for diuresis. 4. Elevated LFTs: Suspect shock liver 5. Hypernatremia: Secondary to critical illness/free water deficit- free water flushes via enteral route. 6. Disposition- poor overall prognosis and recommend palliative care and transition to comfort measures.   Donetta Potts, MD [employer] [phone]

## 2017-10-25 NOTE — Progress Notes (Signed)
Subjective: Family at bedside. Continue to ruminate on the origin of the patient's fevers and why he is still having them. Asking about "mirror mist" an antimicrobial spray commonly use in surgery and whether it could help treat the patient.   Antibiotics:  Anti-infectives (From admission, onward)   Start     Dose/Rate Route Frequency Ordered Stop   10/22/17 1200  ceftaroline (TEFLARO) 300 mg in sodium chloride 0.9 % 250 mL IVPB  Status:  Discontinued     300 mg 250 mL/hr over 60 Minutes Intravenous Every 12 hours 10/22/17 1055 10/23/17 1546   10/15/17 1200  ceftaroline (TEFLARO) 400 mg in sodium chloride 0.9 % 250 mL IVPB  Status:  Discontinued     400 mg 250 mL/hr over 60 Minutes Intravenous Every 12 hours 10/15/17 1052 10/22/17 1055   10/15/17 1100  metroNIDAZOLE (FLAGYL) IVPB 500 mg  Status:  Discontinued     500 mg 100 mL/hr over 60 Minutes Intravenous Every 8 hours 10/15/17 1052 10/23/17 1546   10/13/17 1200  anidulafungin (ERAXIS) 100 mg in sodium chloride 0.9 % 100 mL IVPB  Status:  Discontinued     100 mg 78 mL/hr over 100 Minutes Intravenous Every 24 hours 10/12/17 1123 10/14/17 1238   10/12/17 1200  anidulafungin (ERAXIS) 200 mg in sodium chloride 0.9 % 200 mL IVPB     200 mg 78 mL/hr over 200 Minutes Intravenous  Once 10/12/17 1123 10/12/17 1702   09/24/2017 2000  vancomycin (VANCOCIN) IVPB 1000 mg/200 mL premix  Status:  Discontinued     1,000 mg 200 mL/hr over 60 Minutes Intravenous Every 24 hours 09/21/2017 1754 10/14/17 0951   09/11/2017 1800  meropenem (MERREM) 1 g in sodium chloride 0.9 % 100 mL IVPB  Status:  Discontinued     1 g 200 mL/hr over 30 Minutes Intravenous Every 12 hours 09/21/2017 1720 10/15/17 1052   09/22/2017 0912  vancomycin (VANCOCIN) 1,000 mg in sodium chloride 0.9 % 1,000 mL irrigation  Status:  Discontinued       As needed 10/10/2017 0912 09/26/2017 1115   09/29/2017 0700  fluconazole (DIFLUCAN) IVPB 400 mg  Status:  Discontinued     400 mg 100  mL/hr over 120 Minutes Intravenous To Surgery 10/02/2017 0650 10/05/2017 1115   10/10/2017 0700  rifampin (RIFADIN) 600 mg in sodium chloride 0.9 % 100 mL IVPB  Status:  Discontinued     600 mg 200 mL/hr over 30 Minutes Intravenous To Surgery 09/26/2017 0650 09/10/2017 1115   10/04/2017 0700  vancomycin (VANCOCIN) powder 1,000 mg  Status:  Discontinued     1,000 mg Other To Surgery 09/25/2017 0650 09/11/2017 1115   09/19/2017 0400  vancomycin (VANCOCIN) 1,250 mg in sodium chloride 0.9 % 250 mL IVPB     1,250 mg 166.7 mL/hr over 90 Minutes Intravenous To Surgery 09/14/2017 1910 09/28/2017 1222   09/18/2017 0400  cefUROXime (ZINACEF) 1.5 g in dextrose 5 % 50 mL IVPB     1.5 g 100 mL/hr over 30 Minutes Intravenous To Surgery 10/01/2017 1910 10/05/2017 1202   10/05/2017 0400  cefUROXime (ZINACEF) 750 mg in dextrose 5 % 50 mL IVPB  Status:  Discontinued     750 mg 100 mL/hr over 30 Minutes Intravenous To Surgery 09/13/2017 1910 09/16/2017 1115   10/01/17 0600  cefTRIAXone (ROCEPHIN) 2 g in dextrose 5 % 50 mL IVPB  Status:  Discontinued     2 g 100 mL/hr over 30 Minutes Intravenous  Every 24 hours 10/01/17 0541 10/01/2017 1705   10/03/2017 1315  doxycycline (VIBRA-TABS) tablet 100 mg  Status:  Discontinued     100 mg Oral Every 12 hours 09/26/2017 1301 10/02/17 1346   09/29/2017 1315  cefTRIAXone (ROCEPHIN) 1 g in dextrose 5 % 50 mL IVPB  Status:  Discontinued     1 g 100 mL/hr over 30 Minutes Intravenous Every 24 hours 10/04/2017 1301 10/01/17 0541     Medications: Scheduled Meds: . chlorhexidine gluconate (MEDLINE KIT)  15 mL Mouth Rinse BID  . Chlorhexidine Gluconate Cloth  6 each Topical Daily  . feeding supplement (PRO-STAT SUGAR FREE 64)  30 mL Per Tube BID  . free water  200 mL Per Tube Q6H  . Gerhardt's butt cream   Topical BID  . insulin aspart  2-6 Units Subcutaneous Q4H  . insulin glargine  12 Units Subcutaneous BID  . mouth rinse  15 mL Mouth Rinse 10 times per day  . sennosides  5 mL Oral Daily  . sodium chloride  flush  3 mL Intravenous Q12H  . sodium chloride flush  3 mL Intravenous Q12H   Continuous Infusions: . sodium chloride 20 mL/hr at 10/15/17 0608  . sodium chloride    . amiodarone 30 mg/hr (10/25/17 0700)  . dexmedetomidine (PRECEDEX) IV infusion for high rates 1.2 mcg/kg/hr (10/25/17 0727)  . famotidine (PEPCID) IV Stopped (10/24/17 0908)  . feeding supplement (VITAL 1.5 CAL) Stopped (10/25/17 0000)  . furosemide (LASIX) infusion 12 mg/hr (10/25/17 0721)  . heparin 1,750 Units/hr (10/25/17 0715)  . milrinone 0.5 mcg/kg/min (10/25/17 0700)  . norepinephrine (LEVOPHED) Adult infusion 15 mcg/min (10/25/17 0700)   PRN Meds:.sodium chloride, acetaminophen (TYLENOL) oral liquid 160 mg/5 mL, albuterol, magic mouthwash, midazolam, morphine injection, ondansetron (ZOFRAN) IV, sodium chloride flush, sodium chloride flush, sodium chloride flush  Objective: Weight change: 9 lb (4.082 kg)  Intake/Output Summary (Last 24 hours) at 10/25/2017 0821 Last data filed at 10/25/2017 0700 Gross per 24 hour  Intake 3366.08 ml  Output 4735 ml  Net -1368.92 ml   Blood pressure 93/77, pulse (!) 133, temperature (!) 101.8 F (38.8 C), resp. rate 16, height _0  (1.778 m), weight 234 lb (106.1 kg), SpO2 97 %. Temp:  [96.3 F (35.7 C)-102 F (38.9 C)] 101.8 F (38.8 C) (02/15 0815) Pulse Rate:  [25-142] 133 (02/15 0815) Resp:  [11-33] 16 (02/15 0815) BP: (83-114)/(61-100) 93/77 (02/15 0815) SpO2:  [91 %-100 %] 97 % (02/15 0815) Arterial Line BP: (94-149)/(59-89) 100/66 (02/15 0700) FiO2 (%):  [40 %-60 %] 50 % (02/15 0815) Weight:  [234 lb (106.1 kg)] 234 lb (106.1 kg) (02/15 0400)  Physical Exam: General: In no acute distress HEENT:Anicteric sclera, pupils reactive to light and accommodation, EOMI CVS: Distant heart sounds, tachycardic, irregular, no murmurs, no rubs Chest:Intubated with good air movement and breath sounds on the left, diminished air movement with crackles on the right    Abdomen:Soft nontender, nondistended, normal bowel sounds Extremities:Diffuse anasarca  Skin:No rashes Lymph:No new lymphadenopathy Neuro:Nonfocal  CBC: _1 (wbc3,Hgb:3,Hct:3,Plt:3,INR:3APTT:3)@  BMET Recent Labs    10/24/17 0421 10/24/17 1645 10/25/17 0312  NA 149* 151* 150*  K 3.6 4.2 3.6  CL 116* 113* 115*  CO2 23  --  22  GLUCOSE 124* 148* 94  BUN 107* 102* 109*  CREATININE 3.55* 3.60* 3.65*  CALCIUM 7.4*  --  7.7*   Liver Panel  Recent Labs    10/23/17 0913  PROT 4.7*  ALBUMIN 1.5*  AST 35  ALT 32  ALKPHOS 85  BILITOT 1.0  BILIDIR 0.2  IBILI 0.8   Sedimentation Rate No results for input(s): ESRSEDRATE in the last 72 hours. C-Reactive Protein No results for input(s): CRP in the last 72 hours.  Micro Results: Recent Results (from the past 720 hour(s))  Culture, blood (Routine X 2) w Reflex to ID Panel     Status: Abnormal   Collection Time: 09/26/2017  1:08 PM  Result Value Ref Range Status   Specimen Description BLOOD RIGHT HAND  Final   Special Requests   Final    BOTTLES DRAWN AEROBIC AND ANAEROBIC Blood Culture adequate volume   Culture  Setup Time   Final    GRAM POSITIVE COCCI AEROBIC BOTTLE ONLY CRITICAL RESULT CALLED TO, READ BACK BY AND VERIFIED WITH: J LEDFORD PHARMD 0532 10/01/17 A BROWNING    Culture STREPTOCOCCUS PNEUMONIAE (A)  Final   Report Status 10/03/2017 FINAL  Final   Organism ID, Bacteria STREPTOCOCCUS PNEUMONIAE  Final      Susceptibility   Streptococcus pneumoniae - MIC*    ERYTHROMYCIN <=0.12 SENSITIVE Sensitive     LEVOFLOXACIN 0.5 SENSITIVE Sensitive     PENICILLIN (non-meningitis) 0.25 SENSITIVE Sensitive     CEFTRIAXONE (non-meningitis) <=0.12 SENSITIVE Sensitive     * STREPTOCOCCUS PNEUMONIAE  Blood Culture ID Panel (Reflexed)     Status: Abnormal   Collection Time: 10/06/2017  1:08 PM  Result Value Ref Range Status   Enterococcus species NOT DETECTED NOT DETECTED Final   Listeria monocytogenes NOT DETECTED  NOT DETECTED Final   Staphylococcus species NOT DETECTED NOT DETECTED Final   Staphylococcus aureus NOT DETECTED NOT DETECTED Final   Streptococcus species DETECTED (A) NOT DETECTED Final    Comment: CRITICAL RESULT CALLED TO, READ BACK BY AND VERIFIED WITH: J LEDFORD PHARMD 0532 10/01/17 A BROWNING    Streptococcus agalactiae NOT DETECTED NOT DETECTED Final   Streptococcus pneumoniae DETECTED (A) NOT DETECTED Final    Comment: CRITICAL RESULT CALLED TO, READ BACK BY AND VERIFIED WITH: J LEDFORD PHARMD 0532 10/01/17 A BROWNING    Streptococcus pyogenes NOT DETECTED NOT DETECTED Final   Acinetobacter baumannii NOT DETECTED NOT DETECTED Final   Enterobacteriaceae species NOT DETECTED NOT DETECTED Final   Enterobacter cloacae complex NOT DETECTED NOT DETECTED Final   Escherichia coli NOT DETECTED NOT DETECTED Final   Klebsiella oxytoca NOT DETECTED NOT DETECTED Final   Klebsiella pneumoniae NOT DETECTED NOT DETECTED Final   Proteus species NOT DETECTED NOT DETECTED Final   Serratia marcescens NOT DETECTED NOT DETECTED Final   Haemophilus influenzae NOT DETECTED NOT DETECTED Final   Neisseria meningitidis NOT DETECTED NOT DETECTED Final   Pseudomonas aeruginosa NOT DETECTED NOT DETECTED Final   Candida albicans NOT DETECTED NOT DETECTED Final   Candida glabrata NOT DETECTED NOT DETECTED Final   Candida krusei NOT DETECTED NOT DETECTED Final   Candida parapsilosis NOT DETECTED NOT DETECTED Final   Candida tropicalis NOT DETECTED NOT DETECTED Final  Culture, blood (Routine X 2) w Reflex to ID Panel     Status: None   Collection Time: 10/10/2017  1:11 PM  Result Value Ref Range Status   Specimen Description BLOOD LEFT FOREARM  Final   Special Requests IN PEDIATRIC BOTTLE Blood Culture adequate volume  Final   Culture NO GROWTH 5 DAYS  Final   Report Status 10/05/2017 FINAL  Final  Respiratory Panel by PCR     Status: Abnormal   Collection Time: 09/10/2017  1:51 PM  Result Value Ref Range  Status   Adenovirus NOT DETECTED NOT DETECTED Final   Coronavirus 229E NOT DETECTED NOT DETECTED Final   Coronavirus HKU1 NOT DETECTED NOT DETECTED Final   Coronavirus NL63 NOT DETECTED NOT DETECTED Final   Coronavirus OC43 NOT DETECTED NOT DETECTED Final   Metapneumovirus NOT DETECTED NOT DETECTED Final   Rhinovirus / Enterovirus NOT DETECTED NOT DETECTED Final   Influenza A NOT DETECTED NOT DETECTED Final   Influenza B NOT DETECTED NOT DETECTED Final   Parainfluenza Virus 1 NOT DETECTED NOT DETECTED Final   Parainfluenza Virus 2 NOT DETECTED NOT DETECTED Final   Parainfluenza Virus 3 DETECTED (A) NOT DETECTED Final   Parainfluenza Virus 4 NOT DETECTED NOT DETECTED Final   Respiratory Syncytial Virus NOT DETECTED NOT DETECTED Final   Bordetella pertussis NOT DETECTED NOT DETECTED Final   Chlamydophila pneumoniae NOT DETECTED NOT DETECTED Final   Mycoplasma pneumoniae NOT DETECTED NOT DETECTED Final  Culture, blood (routine x 2)     Status: None   Collection Time: 10/10/2017  4:45 PM  Result Value Ref Range Status   Specimen Description BLOOD LEFT HAND  Final   Special Requests IN PEDIATRIC BOTTLE Blood Culture adequate volume  Final   Culture NO GROWTH 5 DAYS  Final   Report Status 10/05/2017 FINAL  Final  MRSA PCR Screening     Status: None   Collection Time: 09/27/2017  4:49 PM  Result Value Ref Range Status   MRSA by PCR NEGATIVE NEGATIVE Final    Comment:        The GeneXpert MRSA Assay (FDA approved for NASAL specimens only), is one component of a comprehensive MRSA colonization surveillance program. It is not intended to diagnose MRSA infection nor to guide or monitor treatment for MRSA infections.   Culture, blood (routine x 2)     Status: None   Collection Time: 09/29/2017  5:06 PM  Result Value Ref Range Status   Specimen Description BLOOD RIGHT HAND  Final   Special Requests IN PEDIATRIC BOTTLE Blood Culture adequate volume  Final   Culture NO GROWTH 5 DAYS  Final    Report Status 10/05/2017 FINAL  Final  Culture, blood (Routine X 2) w Reflex to ID Panel     Status: None   Collection Time: 09/12/2017  4:18 PM  Result Value Ref Range Status   Specimen Description BLOOD RIGHT ANTECUBITAL  Final   Special Requests IN PEDIATRIC BOTTLE Blood Culture adequate volume  Final   Culture   Final    NO GROWTH 5 DAYS Performed at [facility], [place] [address] [place], [place] [address]    Report Status 10/12/2017 FINAL  Final  Culture, blood (Routine X 2) w Reflex to ID Panel     Status: None   Collection Time: 09/20/2017  4:20 PM  Result Value Ref Range Status   Specimen Description BLOOD RIGHT ANTECUBITAL  Final   Special Requests IN PEDIATRIC BOTTLE Blood Culture adequate volume  Final   Culture   Final    NO GROWTH 5 DAYS Performed at [facility], [place] [address], [place], [place] [address]    Report Status 10/12/2017 FINAL  Final  Surgical pcr screen     Status: Abnormal   Collection Time: 09/19/2017  8:37 PM  Result Value Ref Range Status   MRSA, PCR RESULT CALLED TO, READ BACK BY AND VERIFIED WITH: (A) NEGATIVE Final    CommentJerilynn Mages [facility] RN 09/14/2017  0504 JDW   Staphylococcus aureus INVALID RESULTS, SPECIMEN SENT FOR CULTURE (A) NEGATIVE Final    Comment: Results Called to:  M Shannon [facility] 09/15/2017 0504 JDW (NOTE) The Xpert SA Assay (FDA approved for NASAL specimens in patients 32 years of age and older), is one component of a comprehensive surveillance program. It is not intended to diagnose infection nor to guide or monitor treatment.   MRSA culture     Status: None   Collection Time: 10/03/2017  8:37 PM  Result Value Ref Range Status   Specimen Description NASOPHARYNGEAL  Final   Special Requests NONE  Final   Culture NO MRSA DETECTED  Final   Report Status 10/10/2017 FINAL  Final  Culture, blood (routine x 2)     Status: None   Collection Time: 09/29/2017  5:52 PM  Result Value Ref Range Status   Specimen Description BLOOD RIGHT ANTECUBITAL  Final     Special Requests IN PEDIATRIC BOTTLE Blood Culture adequate volume  Final   Culture   Final    NO GROWTH 5 DAYS Performed at [facility], [place] [address] [place], [place] [address]    Report Status 10/14/2017 FINAL  Final  Culture, blood (routine x 2)     Status: None   Collection Time: 09/19/2017  6:00 PM  Result Value Ref Range Status   Specimen Description BLOOD RIGHT HAND  Final   Special Requests IN PEDIATRIC BOTTLE Blood Culture adequate volume  Final   Culture   Final    NO GROWTH 5 DAYS Performed at [facility], [place] [address], [place], [place] [address]    Report Status 10/14/2017 FINAL  Final  Culture, respiratory (NON-Expectorated)     Status: None   Collection Time: 10/10/17  7:55 AM  Result Value Ref Range Status   Specimen Description TRACHEAL ASPIRATE  Final   Special Requests NONE  Final   Gram Stain   Final    RARE WBC PRESENT, PREDOMINANTLY MONONUCLEAR NO ORGANISMS SEEN Performed at [facility], [place] [address], [place], [place] [address]    Culture RARE CANDIDA ALBICANS  Final   Report Status 10/12/2017 FINAL  Final  Culture, bal-quantitative     Status: Abnormal   Collection Time: 10/11/17 11:11 AM  Result Value Ref Range Status   Specimen Description BRONCHIAL ALVEOLAR LAVAGE  Final   Special Requests NONE  Final   Gram Stain   Final    MODERATE WBC PRESENT,BOTH PMN AND MONONUCLEAR NO SQUAMOUS EPITHELIAL CELLS SEEN RARE BUDDING YEAST SEEN Performed at [facility], [place] [address] [place], [place] [address]    Culture 40,000 COLONIES/mL CANDIDA ALBICANS (A)  Final   Report Status 10/13/2017 FINAL  Final  Culture, respiratory (NON-Expectorated)     Status: None   Collection Time: 10/15/17  8:12 AM  Result Value Ref Range Status   Specimen Description TRACHEAL ASPIRATE  Final   Special Requests Normal  Final   Gram Stain   Final    FEW WBC PRESENT, PREDOMINANTLY PMN RARE GRAM NEGATIVE RODS Performed at [place], [place] [address], [place], [place] [address]    Culture FEW CANDIDA ALBICANS  Final   Report Status 10/18/2017 FINAL  Final  Culture, blood (Routine X 2) w Reflex to ID Panel     Status: None   Collection Time: 10/15/17  8:52 AM  Result Value Ref Range Status   Specimen Description BLOOD RIGHT HAND  Final  Special Requests IN PEDIATRIC BOTTLE Blood Culture adequate volume  Final   Culture   Final    NO GROWTH 5 DAYS Performed at [facility], [place] [address], [place], [place] [address]    Report Status 10/20/2017 FINAL  Final  Culture, blood (Routine X 2) w Reflex to ID Panel     Status: None   Collection Time: 10/15/17 10:20 AM  Result Value Ref Range Status   Specimen Description BLOOD LEFT HAND  Final   Special Requests IN PEDIATRIC BOTTLE Blood Culture adequate volume  Final   Culture   Final    NO GROWTH 5 DAYS Performed at [facility], [place] [address] [place], [place] [address]    Report Status 10/20/2017 FINAL  Final  Culture, Urine     Status: None   Collection Time: 10/15/17 10:29 AM  Result Value Ref Range Status   Specimen Description URINE, CATHETERIZED  Final   Special Requests Normal  Final   Culture   Final    NO GROWTH Performed at [place] [address] [place], [place] [address]    Report Status 10/16/2017 FINAL  Final  Culture, respiratory (NON-Expectorated)     Status: None   Collection Time: 10/20/17  2:45 PM  Result Value Ref Range Status   Specimen Description TRACHEAL ASPIRATE  Final   Special Requests NONE  Final   Gram Stain   Final    MODERATE WBC PRESENT,BOTH PMN AND MONONUCLEAR RARE SQUAMOUS EPITHELIAL CELLS PRESENT NO ORGANISMS SEEN    Culture   Final    NO GROWTH 2 DAYS Performed at [facility], [place] [address], [place], [place] [address]    Report Status 10/22/2017 FINAL  Final  Culture, Urine     Status: None   Collection Time: 10/20/17  2:56 PM  Result Value Ref Range Status   Specimen Description  URINE, CATHETERIZED  Final   Special Requests Normal  Final   Culture   Final    NO GROWTH Performed at [facility], [place] [address] [place], [place] [address]    Report Status 10/21/2017 FINAL  Final  C difficile quick scan w PCR reflex     Status: None   Collection Time: 10/20/17  2:56 PM  Result Value Ref Range Status   C Diff antigen NEGATIVE NEGATIVE Final   C Diff toxin NEGATIVE NEGATIVE Final   C Diff interpretation No C. difficile detected.  Final    Comment: Performed at [facility], [place] [address] [place], [place] [address]  Culture, blood (routine x 2)     Status: None (Preliminary result)   Collection Time: 10/20/17  3:18 PM  Result Value Ref Range Status   Specimen Description BLOOD RIGHT HAND  Final   Special Requests IN PEDIATRIC BOTTLE Blood Culture adequate volume  Final   Culture   Final    NO GROWTH 4 DAYS Performed at [facility], [place] [address], [place], [place] [address]    Report Status PENDING  Incomplete  Culture, blood (routine x 2)     Status: None (Preliminary result)   Collection Time: 10/20/17  3:24 PM  Result Value Ref Range Status   Specimen Description BLOOD LEFT HAND  Final   Special Requests IN PEDIATRIC BOTTLE Blood Culture adequate volume  Final   Culture   Final    NO GROWTH 4 DAYS Performed at [facility], [address] [place], [place] [address]    Report Status PENDING  Incomplete   Studies/Results: Dg Chest Port 1 View  Result Date: 10/24/2017 CLINICAL DATA:  Respiratory distress.  Endotracheal tube placement. EXAM: PORTABLE CHEST 1 VIEW COMPARISON:  Chest radiograph October 23, 2017 and CT chest October 15, 2017 FINDINGS: Endotracheal tube tip projects 2.6 cm above the carina. LEFT subclavian central venous catheter projecting in proximal superior vena cava. Feeding tube past proximal stomach, distal tip not included. RIGHT apical chest tube with side port projecting within the chest wall. Stable  cardiomegaly splaying the carina. Probable supposition of osseous structures LEFT costophrenic angle resulting in nodular density. Small RIGHT pleural effusion decreased from prior imaging. Persistent retrocardiac consolidation and strandy densities RIGHT lung base. No pneumothorax. RIGHT axilla skin staples and surgical clips. IMPRESSION: No apparent change in life-support lines. Stable cardiomegaly with small RIGHT pleural effusion. Persistent retrocardiac consolidation RIGHT lung base probable atelectasis. Electronically Signed   By: Elon Alas M.D.   On: 10/24/2017 01:32   Assessment/Plan:  INTERVAL HISTORY:  - Remains febrile  - No new culture data  - Continues to be on Milrinone and Norepinephrine  - Transthoracic echocardiogram preformed yesterday in anticipation for DCCV today per cardiology notes. No mention of intracardiac thrombosis   ASSESSMENT: Bryan Harmon a 67 y.o.maleHFrEF and Atriat fibrillation who presented on 1/21in A-fib with RVR and low output heart failure. He has subsequently has had a complicated hospitalizationand persistent fevers since 1/30. Patient is critically ill with a very poor prognosis. Family continues to have unrealistic expectations and does not fully understand the clinically information. No new recommendations from an infectious disease standpoint.    LOS: 25 days   [facility] 10/25/2017, 8:21 AM

## 2017-10-26 DIAGNOSIS — B9689 Other specified bacterial agents as the cause of diseases classified elsewhere: Secondary | ICD-10-CM

## 2017-10-26 DIAGNOSIS — R51 Headache: Secondary | ICD-10-CM

## 2017-10-26 DIAGNOSIS — B9562 Methicillin resistant Staphylococcus aureus infection as the cause of diseases classified elsewhere: Secondary | ICD-10-CM

## 2017-10-26 DIAGNOSIS — I38 Endocarditis, valve unspecified: Secondary | ICD-10-CM

## 2017-10-26 LAB — BLOOD CULTURE ID PANEL (REFLEXED)
Acinetobacter baumannii: NOT DETECTED
CANDIDA GLABRATA: NOT DETECTED
CANDIDA TROPICALIS: NOT DETECTED
Candida albicans: NOT DETECTED
Candida krusei: NOT DETECTED
Candida parapsilosis: NOT DETECTED
ENTEROBACTER CLOACAE COMPLEX: NOT DETECTED
ENTEROCOCCUS SPECIES: NOT DETECTED
Enterobacteriaceae species: NOT DETECTED
Escherichia coli: NOT DETECTED
HAEMOPHILUS INFLUENZAE: NOT DETECTED
KLEBSIELLA PNEUMONIAE: NOT DETECTED
Klebsiella oxytoca: NOT DETECTED
LISTERIA MONOCYTOGENES: NOT DETECTED
METHICILLIN RESISTANCE: DETECTED — AB
NEISSERIA MENINGITIDIS: NOT DETECTED
PROTEUS SPECIES: NOT DETECTED
Pseudomonas aeruginosa: NOT DETECTED
SERRATIA MARCESCENS: NOT DETECTED
STAPHYLOCOCCUS AUREUS BCID: DETECTED — AB
STAPHYLOCOCCUS SPECIES: DETECTED — AB
STREPTOCOCCUS AGALACTIAE: NOT DETECTED
STREPTOCOCCUS SPECIES: NOT DETECTED
Streptococcus pneumoniae: NOT DETECTED
Streptococcus pyogenes: NOT DETECTED

## 2017-10-26 LAB — BASIC METABOLIC PANEL
Anion gap: 12 (ref 5–15)
BUN: 104 mg/dL — AB (ref 6–20)
CHLORIDE: 113 mmol/L — AB (ref 101–111)
CO2: 22 mmol/L (ref 22–32)
CREATININE: 3.68 mg/dL — AB (ref 0.61–1.24)
Calcium: 7.7 mg/dL — ABNORMAL LOW (ref 8.9–10.3)
GFR, EST AFRICAN AMERICAN: 18 mL/min — AB (ref >60)
GFR, EST NON AFRICAN AMERICAN: 16 mL/min — AB (ref >60)
Glucose, Bld: 179 mg/dL — ABNORMAL HIGH (ref 65–99)
Potassium: 3.6 mmol/L (ref 3.5–5.1)
SODIUM: 147 mmol/L — AB (ref 135–145)

## 2017-10-26 LAB — POCT I-STAT, CHEM 8
BUN: 91 mg/dL — ABNORMAL HIGH (ref 6–20)
Calcium, Ion: 1.05 mmol/L — ABNORMAL LOW (ref 1.15–1.40)
Chloride: 112 mmol/L — ABNORMAL HIGH (ref 101–111)
Creatinine, Ser: 3.5 mg/dL — ABNORMAL HIGH (ref 0.61–1.24)
Glucose, Bld: 143 mg/dL — ABNORMAL HIGH (ref 65–99)
HCT: 28 % — ABNORMAL LOW (ref 39.0–52.0)
HEMOGLOBIN: 9.5 g/dL — AB (ref 13.0–17.0)
POTASSIUM: 3.4 mmol/L — AB (ref 3.5–5.1)
SODIUM: 150 mmol/L — AB (ref 135–145)
TCO2: 23 mmol/L (ref 22–32)

## 2017-10-26 LAB — CBC WITH DIFFERENTIAL/PLATELET
BASOS PCT: 0 %
Basophils Absolute: 0 10*3/uL (ref 0.0–0.1)
EOS ABS: 0 10*3/uL (ref 0.0–0.7)
EOS PCT: 0 %
HCT: 30.4 % — ABNORMAL LOW (ref 39.0–52.0)
HEMOGLOBIN: 9.4 g/dL — AB (ref 13.0–17.0)
Lymphocytes Relative: 8 %
Lymphs Abs: 0.8 10*3/uL (ref 0.7–4.0)
MCH: 30.4 pg (ref 26.0–34.0)
MCHC: 30.9 g/dL (ref 30.0–36.0)
MCV: 98.4 fL (ref 78.0–100.0)
MONOS PCT: 2 %
Monocytes Absolute: 0.2 10*3/uL (ref 0.1–1.0)
NEUTROS PCT: 90 %
Neutro Abs: 9 10*3/uL — ABNORMAL HIGH (ref 1.7–7.7)
Platelets: 409 10*3/uL — ABNORMAL HIGH (ref 150–400)
RBC: 3.09 MIL/uL — AB (ref 4.22–5.81)
RDW: 20.6 % — ABNORMAL HIGH (ref 11.5–15.5)
WBC: 10.1 10*3/uL (ref 4.0–10.5)

## 2017-10-26 LAB — GLUCOSE, CAPILLARY
GLUCOSE-CAPILLARY: 119 mg/dL — AB (ref 65–99)
GLUCOSE-CAPILLARY: 146 mg/dL — AB (ref 65–99)
GLUCOSE-CAPILLARY: 153 mg/dL — AB (ref 65–99)
GLUCOSE-CAPILLARY: 156 mg/dL — AB (ref 65–99)
GLUCOSE-CAPILLARY: 184 mg/dL — AB (ref 65–99)
Glucose-Capillary: 147 mg/dL — ABNORMAL HIGH (ref 65–99)

## 2017-10-26 LAB — LACTATE DEHYDROGENASE: LDH: 441 U/L — ABNORMAL HIGH (ref 98–192)

## 2017-10-26 LAB — COOXEMETRY PANEL
Carboxyhemoglobin: 1.8 % — ABNORMAL HIGH (ref 0.5–1.5)
Methemoglobin: 1.1 % (ref 0.0–1.5)
O2 Saturation: 90.2 %
Total hemoglobin: 9.6 g/dL — ABNORMAL LOW (ref 12.0–16.0)

## 2017-10-26 LAB — MAGNESIUM: Magnesium: 2.1 mg/dL (ref 1.7–2.4)

## 2017-10-26 LAB — HEPARIN LEVEL (UNFRACTIONATED): Heparin Unfractionated: 0.35 IU/mL (ref 0.30–0.70)

## 2017-10-26 LAB — CALCIUM, IONIZED
Calcium, Ionized, Serum: 4.4 mg/dL — ABNORMAL LOW (ref 4.5–5.6)
Calcium, Ionized, Serum: 4.6 mg/dL (ref 4.5–5.6)

## 2017-10-26 MED ORDER — SODIUM CHLORIDE 0.9 % IV SOLN
300.0000 mg | Freq: Two times a day (BID) | INTRAVENOUS | Status: DC
  Administered 2017-10-26 – 2017-10-27 (×3): 300 mg via INTRAVENOUS
  Filled 2017-10-26 (×4): qty 300

## 2017-10-26 NOTE — Progress Notes (Signed)
PHARMACY - PHYSICIAN COMMUNICATION CRITICAL VALUE ALERT - BLOOD CULTURE IDENTIFICATION (BCID)  Bryan Harmon is an 67 y.o. male who presented to [facility] on 10/08/2017   Assessment: Multiple medical issues including resp failure, heart failure, AKI, s/p impella   -Pt was extubated on 2/15 with plans to not re-intubate   Name of physician (or Provider) Contacted: Dr. Hildred Alamin   Current antibiotics: None   Changes to prescribed antibiotics recommended:  Defer to heart failure team this AM  Results for orders placed or performed during the hospital encounter of 10/01/2017  Blood Culture ID Panel (Reflexed) (Collected: 10/25/2017 11:12 AM)  Result Value Ref Range   Enterococcus species NOT DETECTED NOT DETECTED   Listeria monocytogenes NOT DETECTED NOT DETECTED   Staphylococcus species DETECTED (A) NOT DETECTED   Staphylococcus aureus DETECTED (A) NOT DETECTED   Methicillin resistance DETECTED (A) NOT DETECTED   Streptococcus species NOT DETECTED NOT DETECTED   Streptococcus agalactiae NOT DETECTED NOT DETECTED   Streptococcus pneumoniae NOT DETECTED NOT DETECTED   Streptococcus pyogenes NOT DETECTED NOT DETECTED   Acinetobacter baumannii NOT DETECTED NOT DETECTED   Enterobacteriaceae species NOT DETECTED NOT DETECTED   Enterobacter cloacae complex NOT DETECTED NOT DETECTED   Escherichia coli NOT DETECTED NOT DETECTED   Klebsiella oxytoca NOT DETECTED NOT DETECTED   Klebsiella pneumoniae NOT DETECTED NOT DETECTED   Proteus species NOT DETECTED NOT DETECTED   Serratia marcescens NOT DETECTED NOT DETECTED   Haemophilus influenzae NOT DETECTED NOT DETECTED   Neisseria meningitidis NOT DETECTED NOT DETECTED   Pseudomonas aeruginosa NOT DETECTED NOT DETECTED   Candida albicans NOT DETECTED NOT DETECTED   Candida glabrata NOT DETECTED NOT DETECTED   Candida krusei NOT DETECTED NOT DETECTED   Candida parapsilosis NOT DETECTED NOT DETECTED   Candida tropicalis NOT DETECTED NOT  DETECTED    Bryan Harmon 10/26/2017  3:09 AM

## 2017-10-26 NOTE — Progress Notes (Signed)
Patient ID: Bryan Harmon, male   DOB: [birth year], 67 y.o.   MRN: [id number]     Advanced Heart Failure Rounding Note  Primary Cardiologist: Aundra Dubin   Subjective:    Events:  1/21 admitted with cardiogenic shock and lactic acidosis 1/30  Impella 5.0 placed in the OR 1/31  Right Chest tube placed c/b bleeding from CT. Multiple transfusions given (13u total)  2/4 ID consulted for ongoing fever.  2/4 Neuro consulted for ?seizure. CT of head with no acute findings. EEG without seizure activity.  2/5 Echo for impella placement showed a small vegetation on a mitral valve chord.  2/5 CT of chest/abd/pelvis: Necrotizing PNA on right with complex pleural effusion.    2/6 Impella removed due to blockage => suspect small vegetation on MV chord was drawn into the Impella, stopping the device.  2/13 Abx stopped by ID 2/15 Failed DC-CV 2/15 Extubated made DNR. Seen by Palliative Care 2/16 Bcx + MRSA  Extubated yesterday after failed DC-CV. Seen by Palliative Care yesterday but family resistant to conversations. Family and RN report patient awake and communicative this am but very weak. C/o right-sided HA. Given Dialudid and now sedated fully.   Remains on amio 30 mg/hr, milrinone 0.5 mcg/kg/min, norepinephrine 15.  Lasix gtt going at 12 mg/hr. On Precedex. Coox inaccurate.   Bcx + for MRSA. ID has started Teflaro.   Remains markedly edematous. Weight recorded as up 9 pounds. I/Os - 2L . Creatinine up slightly to 3.7    Studies:  Echo: Severe LV dilation, EF 15-20%, severely dilated RV with severely decreased RV systolic function, moderate-severe MR, moderate-severe TR, cannot rule out LV apical thrombus.  Repeat echo with Definity showed definite LV apical thrombus.   TEE (1/28): EF 10-15%, diffuse hypokinesis, small LV apical thrombus, mildly dilated RV with moderately decreased systolic function, no LAA thrombus.    Objective:   Weight Range: 106.1 kg (234 lb) Body mass index is  33.58 kg/m.   Vital Signs:   Temp:  [101.1 F (38.4 C)-102.2 F (39 C)] 101.5 F (38.6 C) (02/16 0900) Pulse Rate:  [31-156] 79 (02/16 0900) Resp:  [13-27] 27 (02/16 0900) BP: (88-123)/(55-99) 93/69 (02/16 0900) SpO2:  [88 %-97 %] 91 % (02/16 0900) Arterial Line BP: (95-117)/(54-72) 112/65 (02/16 0900) FiO2 (%):  [40 %] 40 % (02/15 1127) Last BM Date: 10/24/17  Weight change: Filed Weights   10/23/17 0500 10/24/17 0500 10/25/17 0400  Weight: 103 kg (227 lb) 102.1 kg (225 lb) 106.1 kg (234 lb)    Intake/Output:   Intake/Output Summary (Last 24 hours) at 10/26/2017 1027 Last data filed at 10/26/2017 0924 Gross per 24 hour  Intake 2708.32 ml  Output 4285 ml  Net -1576.68 ml      Physical Exam   CVP 8-9  General:  Critically ill and cachetic appearing. Sedated No resp difficulty HEENT: + temporal wasting. NGT Neck: supple. JVP 10  Carotids 2+ bilat; no bruits. No lymphadenopathy or thryomegaly appreciated. Cor: Tachy irregular.  LSC central line. R chest tube  Lungs: clear anterioly. Dull at right base Abdomen: soft, nontender, nondistended. No hepatosplenomegaly. No bruits or masses. Good bowel sounds. Extremities: no cyanosis, clubbing, rash, diffuse 3-4+ edema  Boots on  Neuro: sedated non-responsive   Telemetry   A fib 120-130s, Personally reviewed   EKG    No new tracings.    Labs    CBC Recent Labs    10/25/17 0312 10/26/17 0359  WBC 9.6 10.1  NEUTROABS  8.6* 9.0*  HGB 9.1* 9.4*  HCT 29.7* 30.4*  MCV 97.7 98.4  PLT 422* 790*   Basic Metabolic Panel Recent Labs    10/24/17 0421  10/25/17 0312 10/26/17 0359  NA 149*   < > 150* 147*  K 3.6   < > 3.6 3.6  CL 116*   < > 115* 113*  CO2 23  --  22 22  GLUCOSE 124*   < > 94 179*  BUN 107*   < > 109* 104*  CREATININE 3.55*   < > 3.65* 3.68*  CALCIUM 7.4*  --  7.7* 7.7*  MG 2.2  --  2.1 2.1  PHOS 4.9*  --   --   --    < > = values in this interval not displayed.   Liver Function Tests No  results for input(s): AST, ALT, ALKPHOS, BILITOT, PROT, ALBUMIN in the last 72 hours. No results for input(s): LIPASE, AMYLASE in the last 72 hours. Cardiac Enzymes No results for input(s): CKTOTAL, CKMB, CKMBINDEX, TROPONINI in the last 72 hours.  BNP: BNP (last 3 results) Recent Labs    12/13/16 0949 02/11/17 1049 09/12/2017 1218  BNP 369.2* 562.7* 2,485.9*    ProBNP (last 3 results) No results for input(s): PROBNP in the last 8760 hours.   D-Dimer No results for input(s): DDIMER in the last 72 hours. Hemoglobin A1C No results for input(s): HGBA1C in the last 72 hours. Fasting Lipid Panel No results for input(s): CHOL, HDL, LDLCALC, TRIG, CHOLHDL, LDLDIRECT in the last 72 hours. Thyroid Function Tests No results for input(s): TSH, T4TOTAL, T3FREE, THYROIDAB in the last 72 hours.  Invalid input(s): FREET3  Other results:   Imaging    No results found.   Medications:     Scheduled Medications: . chlorhexidine gluconate (MEDLINE KIT)  15 mL Mouth Rinse BID  . Chlorhexidine Gluconate Cloth  6 each Topical Daily  . famotidine  20 mg Intravenous Q24H  . feeding supplement (PRO-STAT SUGAR FREE 64)  30 mL Per Tube BID  . free water  200 mL Per Tube Q6H  . Gerhardt's butt cream   Topical BID  . glycopyrrolate  0.2 mg Intravenous Q4H  . insulin aspart  2-6 Units Subcutaneous Q4H  . insulin glargine  12 Units Subcutaneous BID  . mouth rinse  15 mL Mouth Rinse 10 times per day  . sennosides  5 mL Oral Daily  . sodium chloride flush  3 mL Intravenous Q12H  . sodium chloride flush  3 mL Intravenous Q12H    Infusions: . sodium chloride 250 mL (10/25/17 1132)  . sodium chloride    . amiodarone 30 mg/hr (10/26/17 1019)  . ceFTAROline (TEFLARO) IV 300 mg (10/26/17 1020)  . dexmedetomidine (PRECEDEX) IV infusion for high rates 1.2 mcg/kg/hr (10/26/17 0924)  . feeding supplement (VITAL 1.5 CAL) 1,000 mL (10/26/17 0600)  . furosemide (LASIX) infusion 12 mg/hr (10/26/17  0600)  . heparin 1,750 Units/hr (10/26/17 0706)  . milrinone 0.5 mcg/kg/min (10/26/17 0747)  . norepinephrine (LEVOPHED) Adult infusion 15.04 mcg/min (10/26/17 0600)    PRN Medications: sodium chloride, acetaminophen (TYLENOL) oral liquid 160 mg/5 mL, albuterol, HYDROmorphone (DILAUDID) injection, magic mouthwash, midazolam, ondansetron (ZOFRAN) IV, sodium chloride flush, sodium chloride flush, sodium chloride flush    Patient Profile  [name] is a 67 year old with a history of PAF S/P DC-CV 11/2016, s/p bilateral inguinal hernia repair 11/2017, PVCs, HTN, NICM, chronic systolic heart failure.   Sent from Urgent Care with  A fib RVR. Acutely SOB on arrival   Assessment/Plan   1. Acute hypoxemic respiratory failure: RLL PNA, Strep pneumoniae in blood cultures. Respiratory cultures with parainfluenzae virus. Also acute/chronic systolic CHF.Now intubated s/p Impella 5.0 placement, multifocal PNA on CXR and CT chest. Remains critically ill.  Antibiotics broadened from ceftriaxone to vancomycin/meropenem on 1/30. BAL with Candida albicans, anidulafungin started 2/2.  Necrotizing PNA with complex pleural effusion on right on 2/5 CT, anti-microbials changed to ceftaroline and Flagyl on 2/5.  ID concerned about possible drug fever, stopped antibiotics on 2/13. Bcx 2/15 now 2/2 MRSA presumes from lung - Right chest tube placed 1/31, still with serosanguineous drainage. - Suspect that necrosis in right lung + loculated effusion would require surgery to quell infection.  However, he is too unstable for this.  No change.  - One-way extubation performed 2/15. Respiratory status stable this am with adequate oxygenation. D/w CCM at bedside 2.Acute on chronic systolic CHF-> cardiogenic shock : Nonischemic cardiomyopathy. 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 Etiology of his CHF is not clear =>no definite inciting event. He  has a history of HTN but doubt this was the only trigger. 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 This would ideally be confirmed by cMRI, but he has not wanted an MRI (concerned about side effects). He also has frequent PVCs, 21% total on last holter in 4/18 which is a risk for fall in EF.No family history of CMP. Cannot rule out viral myocarditis. SPEP negative. With medical management, he initially felt a lot better. However, he quit all his meds in early 2018 with recurrence of NYHA III symptoms and onset of atrial fibrillation.He had TEE-DCCV in 3/18.TEE showed that EF remained25%. He quit his meds again in 4/18 and apparently did not restart them when I asked him to in 6/18. No meds probably since 4/18. Echo this admission with EF 15-20%, severe RV dysfunction, LV apical thrombus. RV is hypokinetic.  Attempted DCCV on 1/28 failed likely due to pressors/inotropes.  [place] 1/29 with low cardiac output => Impella 5.0 placed 1/30. On 2/6, Impella removed due to pump stop (suspect vegetation from MV chord sucked into Impella). Suspect combination of septic/vasogenic shock and cardiogenic shock.  Luiz Blare out on 2/10.   Coox inaccurate this am on milrinone 0.5 mcg/kg/min, norepinephrine 12.   - Creatinine trending up to 3.7. CVP 9-10  Has  Diffuse 4+ edema with 3rd spacing.  Increase Lasix gtt to 15 mg/hr.  - Continue current milrinone and norepinephrine as family not wanting to de-escalalt.   - End point still difficult to envision => would not be LVAD candidate with renal failure and RV failure.  Family met with Palliative care 2/15 and we resistant to further changes 3. Atrial fibrillation:Persistent, now with RVR. HR up to 140sinitially, now 110s on amiodarone gtt at30. Not sure how long he has been in atrial fibrillation with RVR, but this may have triggered his CHF and AKI due to worsening of cardiac output.  -Heparin gtt ongoing. - Continue amiodarone gtt at 30 mg/hr.  - Failed  TEE-guided DCCV on 1/28 and 2/15 in setting of vasoactive meds. Remains on IV amio and heparin  - Family initially said they wanted comfort care if DC-CV failed. He has now been extubated and respiratory status stable. Family not wanting to de-escalate care at this time 4. AKI: Creatinine 1.08 in 6/18, he has not been on any meds. Suspect cardiorenal syndrome with afib/RVR  and fall in cardiac output.He was initially hyperkalemic and acidotic.BUN/creatinine continue to rise despite hemodynamic support  - Supported CO with milrinone.  - Continue Lasix gtt with elevated CVP and severe edema. Weight up 60 pounds from baseline. No change.  - Worsening renal function despite support is concerning, he is not HD candidate.  5. WO:EHOZY pneumo PNA as well as parainfluenza virus.CT chest 1/29 with multifocal PNA. On  1/30 to vancomycin/meropenem, then anidulafungin added.  ID has seen. Now on Teflaro and metronidazole. CT of chest/abd/pelvis 2/5 concerning for necrotizing right pneumonia with surrounding complex large pleural effusion.  He has a chest tube with ongoing serosanguineous drainage. 2/5 showed a small vegetation on a mitral valve chord.  Repeat ECHO on 2/6 did not show vegetation. Assumed it was pulled into Impella.  He is now off [employer].   - ID saw 10/23/17, held abx for ?drug fever.  Tmax 102.  - Bcx from 2/15 2/2 MRSA - started on Teflaro - Necrosis in right lung + loculated effusion would ideally require surgery to effectively treat infection, however do not think he would survive surgery at this point.    6. Elevated LFTs: Suspect shock liver type picture. - AST/ALT > 1000 at admission but have trended down steadily. No change 7. LV thrombus: Noted on TTE and TEE, small.  - Remains on heparin.   - No LV thrombus noted on limited echo 10/24/17. 8. Hypernatremia: Mild, Improved Na 147 today.   9. Malnutrition: Continue TFs 10. Anemia: Significant blood loss via chest tube and mouth.    Received 2UPRBCs 2/3, 2/4 5 uPRBCs, 2/5 2UPRBCs. 2/6 2U PRBCs. 1 unit PRBCs 2/9, 1 unit PRBCs 2/13.  Hgb stable today.  11. Suspected Deep Tissue Injury: Sacrum/R buttock/Mid back . Continue to repostion R/L. WOC consult  Appreciated. 12. Seizure: Neurology appreciated. CT head negative. EEG no seizure noted. No change.  13. Headache: - no other focal findings per RN and family - now sedated after dilaudid - if has ongoing pain or focal neuro deficits can do CT looking for bleed or other pathology  Failed DC-CV yesterday and underwent one-way extubation. Remains critically ill but BP and respiratory status stable today on current support. Bcx now with MRSA. ID has added Teflaro. Re-iterated graveness of situation to family and my firm belief that he will almost certainly never be able to walk out of this hospital. They have seen Palliative Care yesterday and remain uninterested in de-escalating care any further at this point.   CRITICAL CARE Performed by: Glori Bickers  Total critical care time: 45 minutes  Critical care time was exclusive of separately billable procedures and treating other patients.  Critical care was necessary to treat or prevent imminent or life-threatening deterioration.  Critical care was time spent personally by me (independent of midlevel providers or residents) on the following activities: development of treatment plan with patient and/or surrogate as well as nursing, discussions with consultants, evaluation of patient's response to treatment, examination of patient, obtaining history from patient or surrogate, ordering and performing treatments and interventions, ordering and review of laboratory studies, ordering and review of radiographic studies, pulse oximetry and re-evaluation of patient's condition.  Glori Bickers, MD  10/26/2017 10:27 AM  Advanced Heart Failure Team Pager [phone] (M-F; [place])  Please contact [facility] for night-coverage  after hours (4p -7a ) and weekends on [email or web address]

## 2017-10-26 NOTE — Progress Notes (Signed)
   INFECTIOUS DISEASE ATTENDING ADDENDUM:   Patient with MRSA isolated on blood cultures  Dr. Orvan Falconer had proposed stopping the Teflaro he ws previously on in order to potentially discover a pathogen  Now we have found one.  He undoubtedly has SEEDING of all of his lines which would need to be removed to effect cure.  I suspect also that this organism is involved in his necrotizing pneumonia  I will restart the [facility] that was stopped since it would cover MRSA and prior pneumococcus and have activity in the lungs and not worsen his renal failure  As he has not yet opted for palliative care this seems like rational approach  It does ADD further weight to the idea he will not survive this hospitalization  SAB mortality is 25% for ALL comers.  We cannot surgically go after his pleural space and he will not be able to have a catheter holiday.

## 2017-10-26 NOTE — Progress Notes (Signed)
ANTICOAGULATION CONSULT NOTE - Follow Up Consult  Pharmacy Consult:  Heparin Indication: atrial fibrillation and LA thrombus  Allergies  Allergen Reactions  . Fentanyl And Related Other (See Comments)    Patients eyes roll in the back of his head and patient has seizure like activity    Patient Measurements: Height: 5\' 10"  (177.8 cm) Weight: (bed without scale) IBW/kg (Calculated) : 73  Heparin dosing weight = 84 kg  Vital Signs: Temp: 101.3 F (38.5 C) (02/16 0800) BP: 105/75 (02/16 0800) Pulse Rate: 68 (02/16 0800)  Labs: Recent Labs    10/24/17 0623 10/24/17 1645 10/25/17 0312 10/25/17 1315 10/26/17 0359  HGB 9.2* 9.5* 9.1*  --  9.4*  HCT 30.1* 28.0* 29.7*  --  30.4*  PLT 433*  --  422*  --  409*  HEPARINUNFRC  --   --  0.17* 0.23* 0.35  CREATININE  --  3.60* 3.65*  --  3.68*    Estimated Creatinine Clearance: 24.4 mL/min (A) (by C-G formula based on SCr of 3.68 mg/dL (H)).  Assessment: 21 YOM known to pharmacy for heparin management with impella. Impella removed 10/16/17 due to stoppage from small vegetation that got sucked into the catheter. Heparin resumed ~ 4 hours after removal for afib and LA thrombus.   No overt bleeding or complications noted currently.  Heparin level at goal of 0.3. Hemoglobin stable  MRSA bacteremia noted overnight, Teflaro resumed by ID this am. Dose appropriate.   Goal of Therapy:  Heparin level 0.3 units/ml Monitor platelets by anticoagulation protocol: Yes   Plan:  Continue IV heparin at 1750 units/hr. Restart teflaro 300 q12 hours.  Sheppard Coil PharmD., BCPS Clinical Pharmacist 10/26/2017 8:55 AM

## 2017-10-26 NOTE — Progress Notes (Addendum)
PULMONARY / CRITICAL CARE MEDICINE   Name: Bryan Harmon MRN: [id number] DOB: [birth year]    ADMISSION DATE:  09/14/2017  REFERRING MD:  Nils Pyle  CHIEF COMPLAINT:  Fatigue  HISTORY OF PRESENT ILLNESS:   67 y/o male with non-ischemic cardiomyopathy and afib admitted with decompensated heart failure.  Required Impella, vent.    Lines/ Tubes 1/30 L IJ CVL > 1/31 1/30 R IJ swan > 1/30 R sub clav impella > 10/16/2017 1/30 L radial arterial line > 10/15/2017 1/30 ETT >  1/31 L [place] TLC >  10/15/2017 right femoral arterial line placed>>  Cultures: 1/21 Blood >> Strep Pneumoniae >> pan sens 1/21 Resp Viral Panel >> + Parainfluenza Virus 3 1/28 blood > ng 1/30 blood > ng 1/31 resp culture > ng 2/1 BAL > candida 10/15/2017 blood cultures x2>> 10/15/2017 tracheal aspirate>> rare gram-negative rods few Candida albicans 10/15/2017 urine culture>> negative  ABX: Infectious disease consult 10/14/2017 Rocephin 1/21 >> 1/29 Doxy 1/21 >> 1/23 Vanc 1/30 >> 10/14/2017 Mero 1/30 >> 10/15/2017 Eraxis 2/2 >> off Teflaro 10/15/2017>>2/14 Flagyl 10/15/2017>>2/14  Studies: 10/01/2017>> Echo EF 15-20%, A[place] ? PAP muscle vs. Thrombus, LV severely dilated, moderate concentric hypertrophy, systolic function normal, unable to evaluate LV diastolic function due to atrial fibrillation. + moderate spontaneous echo contrast, indicative of stasis Impression 1/23 Echo > Severely dilated LV with severe LV dysfunction globally with EF 15-20%. Severely dialted RV with severe RV dysfunction. Moderate to severe MR with ERO 0.33cm2 and MR volume 7m. Moderate tosevere TR with moderate pulmonary HTN. Moderately thickened and calcified AV leaflets with mild MR, mildly dilated aortic root, massive biatrial enlargement. Cannot rule out LV thrombus. Thereis significant spontaneous echo contrast in LV c/w sluggish blood flow. The right ventricular systolic pressure was increasedconsistent with moderate pulmonary  hypertension. 1/29 CT chest > dense bilateral lower lobe consolidation R>L 1/30 TEE >> LVEF 20-25%, impella in place, poor overall contractility but LV improved 10/25/2017 bedside cardioversion attempted without success.   SUBJECTIVE:   Failed cardioversion yesterday.  After discussion the family extubated with no plans to reintubate.  No escalation of care.  He is not comfort care yet but CODE STATUS is DNR. Blood cultures show MRSA.  He has been started on Teflaro Family is concerned as he has right-sided headaches.  VITAL SIGNS: BP 93/71   Pulse (!) 138   Temp (!) 102 F (38.9 C)   Resp 11   Ht _0  (1.778 m)   Wt 234 lb (106.1 kg)   SpO2 91%   BMI 33.58 kg/m   HEMODYNAMICS: CVP:  [4 mmHg-9 mmHg] 6 mmHg  VENTILATOR SETTINGS:    INTAKE / OUTPUT: I/O last 3 completed shifts: In: 4712.2 [I.V.:3012.2; NG/GT:1600; IV Piggyback:100] Out: 77035[Urine:6715; Emesis/NG output:60; Chest Tube:500]  PHYSICAL EXAMINATION: Gen:      No acute distress, frail, cachectic HEENT:  EOMI, sclera anicteric Neck:     No masses; no thyromegaly Lungs:    Clear to auscultation bilaterally; normal respiratory effort CV:         Regular rate and rhythm; no murmurs Abd:      + bowel sounds; soft, non-tender; no palpable masses, no distension Ext:    Anasarca Skin:      Warm and dry; no rash Neuro: Sedated, unresponsive  LABS:  BMET Recent Labs  Lab 10/24/17 0421 10/24/17 1645 10/25/17 0312 10/26/17 0359  NA 149* 151* 150* 147*  K 3.6 4.2 3.6 3.6  CL 116* 113* 115* 113*  CO2 23  --  22 22  BUN 107* 102* 109* 104*  CREATININE 3.55* 3.60* 3.65* 3.68*  GLUCOSE 124* 148* 94 179*   Electrolytes Recent Labs  Lab 10/21/17 0354  10/23/17 0435 10/24/17 0421 10/25/17 0312 10/26/17 0359  CALCIUM 7.5*   < > 7.3* 7.4* 7.7* 7.7*  MG 2.2   < > 2.2 2.2 2.1 2.1  PHOS 5.2*  --  4.3 4.9*  --   --    < > = values in this interval not displayed.   CBC Recent Labs  Lab 10/24/17 0623  10/24/17 1645 10/25/17 0312 10/26/17 0359  WBC 9.4  --  9.6 10.1  HGB 9.2* 9.5* 9.1* 9.4*  HCT 30.1* 28.0* 29.7* 30.4*  PLT 433*  --  422* 409*   Coag's No results for input(s): APTT, INR in the last 168 hours. Sepsis Markers No results for input(s): LATICACIDVEN, PROCALCITON, O2SATVEN in the last 168 hours.  ABG Recent Labs  Lab 10/23/17 0419 10/24/17 0104 10/24/17 0456  PHART 7.419 7.274* 7.418  PCO2ART 29.8* 48.9* 34.3  PO2ART 81.0* 89.0 84.0   Liver Enzymes Recent Labs  Lab 10/23/17 0913  AST 35  ALT 32  ALKPHOS 85  BILITOT 1.0  ALBUMIN 1.5*   Cardiac Enzymes No results for input(s): TROPONINI, PROBNP in the last 168 hours.  Glucose Recent Labs  Lab 10/25/17 0741 10/25/17 1242 10/25/17 1952 10/25/17 2356 10/26/17 0407 10/26/17 0758  GLUCAP 60* 84 83 119* 184* 147*   Imaging No results found. DISCUSSION: 67 y/o male with ischemic cardiomyopathy admitted with decompensated systolic heart failure in the setting of strep pneumo pneumonia and bacteremia s/p impella.  He has acute on chronic kidney failure, cardiogenic shock.  10/15/2017 with fever 105.6, pancultured 01/2018, ID consult 10/14/2017. 's overall cardiac function has declined despite all interventions.  Family wants to continue with maximum interventions.  10/18/2016 CODE STATUS changed to a limited with no chest compressions but all other interventions will be continued.  Despite his poor prognosis he is continued to improve therefore possibility of tracheostomy may be undertaken within 72 hours. 10/19/2017 no significant change in status.  I do note there is some having been placed on pressure regulated volume control mode of ventilation which was returned to written orders for pressure control ventilation on 10/19/2017. His hemoglobin is drifting down he may be ready for follow-up transfusion 10/20/2017 continues to slowly improve.  Massive anasarca has improved despite being a positive I&O.  His Levophed is  weaning.   10/25/2017 One way extubation   ASSESSMENT / PLAN: 67 year old with ischemic cardiomyopathy, MR, paroxysmal atrial fibrillation admitted with fatigue, rapid atrial fibrillation, cardiogenic shock, Streptococcus bacteremia, presumed pneumonia, parainfluenza infection.  Prolonged course with Impala placement which had to be removed due to thrombosis, acute kidney failure  Respiratory failure Necrotizing pneumococcus pneumonia, parainfluenza infection Severe cardiogenic shock  S/p one-way extubation.  Code status is DNR. Family is not ready to withdraw Continue Precedex and other drips for now.  Use dilaudid and Ativan as needed for comfort Bipap if needed.  Head CT if he continues to have head aches or show neurologic deficits Teflaro now for MRSA bacteremia.  Prognosis is extremely grim. Family updated again at bedside with Dr. Haroldine Laws. Discussed with Dr. Tommy Medal.  The patient is critically ill with multiple organ system failure and requires high complexity decision making for assessment and support, frequent evaluation and titration of therapies, advanced monitoring, review of radiographic studies and interpretation of complex data.  Critical Care Time devoted to patient care services, exclusive of separately billable procedures, described in this note is 35 minutes.   Marshell Garfinkel MD [facility] Pager [phone] If no answer or after 3pm call: [phone] 10/26/2017, 11:47 AM

## 2017-10-26 NOTE — Progress Notes (Signed)
Subjective:  Pt had headache earlier   Antibiotics:  Anti-infectives (From admission, onward)   Start     Dose/Rate Route Frequency Ordered Stop   10/26/17 1000  ceftaroline (TEFLARO) 300 mg in sodium chloride 0.9 % 250 mL IVPB    Comments:  Teflaro 300 mg IV q12h for CrCl < 30 mL/min   300 mg 250 mL/hr over 60 Minutes Intravenous Every 12 hours 10/26/17 0801     10/22/17 1200  ceftaroline (TEFLARO) 300 mg in sodium chloride 0.9 % 250 mL IVPB  Status:  Discontinued     300 mg 250 mL/hr over 60 Minutes Intravenous Every 12 hours 10/22/17 1055 10/23/17 1546   10/15/17 1200  ceftaroline (TEFLARO) 400 mg in sodium chloride 0.9 % 250 mL IVPB  Status:  Discontinued     400 mg 250 mL/hr over 60 Minutes Intravenous Every 12 hours 10/15/17 1052 10/22/17 1055   10/15/17 1100  metroNIDAZOLE (FLAGYL) IVPB 500 mg  Status:  Discontinued     500 mg 100 mL/hr over 60 Minutes Intravenous Every 8 hours 10/15/17 1052 10/23/17 1546   10/13/17 1200  anidulafungin (ERAXIS) 100 mg in sodium chloride 0.9 % 100 mL IVPB  Status:  Discontinued     100 mg 78 mL/hr over 100 Minutes Intravenous Every 24 hours 10/12/17 1123 10/14/17 1238   10/12/17 1200  anidulafungin (ERAXIS) 200 mg in sodium chloride 0.9 % 200 mL IVPB     200 mg 78 mL/hr over 200 Minutes Intravenous  Once 10/12/17 1123 10/12/17 1702   10/02/2017 2000  vancomycin (VANCOCIN) IVPB 1000 mg/200 mL premix  Status:  Discontinued     1,000 mg 200 mL/hr over 60 Minutes Intravenous Every 24 hours 09/29/2017 1754 10/14/17 0951   10/06/2017 1800  meropenem (MERREM) 1 g in sodium chloride 0.9 % 100 mL IVPB  Status:  Discontinued     1 g 200 mL/hr over 30 Minutes Intravenous Every 12 hours 09/25/2017 1720 10/15/17 1052   09/16/2017 0912  vancomycin (VANCOCIN) 1,000 mg in sodium chloride 0.9 % 1,000 mL irrigation  Status:  Discontinued       As needed 09/13/2017 0912 09/27/2017 1115   09/25/2017 0700  fluconazole (DIFLUCAN) IVPB 400 mg  Status:  Discontinued      400 mg 100 mL/hr over 120 Minutes Intravenous To Surgery 09/26/2017 0650 09/23/2017 1115   09/22/2017 0700  rifampin (RIFADIN) 600 mg in sodium chloride 0.9 % 100 mL IVPB  Status:  Discontinued     600 mg 200 mL/hr over 30 Minutes Intravenous To Surgery 10/06/2017 0650 09/25/2017 1115   10/10/2017 0700  vancomycin (VANCOCIN) powder 1,000 mg  Status:  Discontinued     1,000 mg Other To Surgery 09/26/2017 0650 10/03/2017 1115   10/02/2017 0400  vancomycin (VANCOCIN) 1,250 mg in sodium chloride 0.9 % 250 mL IVPB     1,250 mg 166.7 mL/hr over 90 Minutes Intravenous To Surgery 10/06/2017 1910 09/28/2017 1222   09/15/2017 0400  cefUROXime (ZINACEF) 1.5 g in dextrose 5 % 50 mL IVPB     1.5 g 100 mL/hr over 30 Minutes Intravenous To Surgery 09/18/2017 1910 10/04/2017 1202   09/14/2017 0400  cefUROXime (ZINACEF) 750 mg in dextrose 5 % 50 mL IVPB  Status:  Discontinued     750 mg 100 mL/hr over 30 Minutes Intravenous To Surgery 09/26/2017 1910 09/19/2017 1115   10/01/17 0600  cefTRIAXone (ROCEPHIN) 2 g in dextrose 5 % 50 mL IVPB  Status:  Discontinued     2 g 100 mL/hr over 30 Minutes Intravenous Every 24 hours 10/01/17 0541 09/26/2017 1705   09/18/2017 1315  doxycycline (VIBRA-TABS) tablet 100 mg  Status:  Discontinued     100 mg Oral Every 12 hours 09/12/2017 1301 10/02/17 1346   09/15/2017 1315  cefTRIAXone (ROCEPHIN) 1 g in dextrose 5 % 50 mL IVPB  Status:  Discontinued     1 g 100 mL/hr over 30 Minutes Intravenous Every 24 hours 09/13/2017 1301 10/01/17 0541      Medications: Scheduled Meds: . chlorhexidine gluconate (MEDLINE KIT)  15 mL Mouth Rinse BID  . Chlorhexidine Gluconate Cloth  6 each Topical Daily  . famotidine  20 mg Intravenous Q24H  . feeding supplement (PRO-STAT SUGAR FREE 64)  30 mL Per Tube BID  . free water  200 mL Per Tube Q6H  . Gerhardt's butt cream   Topical BID  . glycopyrrolate  0.2 mg Intravenous Q4H  . insulin aspart  2-6 Units Subcutaneous Q4H  . insulin glargine  12 Units Subcutaneous BID  . mouth  rinse  15 mL Mouth Rinse 10 times per day  . sennosides  5 mL Oral Daily  . sodium chloride flush  3 mL Intravenous Q12H  . sodium chloride flush  3 mL Intravenous Q12H   Continuous Infusions: . sodium chloride 250 mL (10/25/17 1132)  . sodium chloride    . amiodarone 30 mg/hr (10/26/17 1019)  . ceFTAROline (TEFLARO) IV Stopped (10/26/17 1120)  . dexmedetomidine (PRECEDEX) IV infusion for high rates 1.2 mcg/kg/hr (10/26/17 0924)  . feeding supplement (VITAL 1.5 CAL) 1,000 mL (10/26/17 1050)  . furosemide (LASIX) infusion 15 mg/hr (10/26/17 1057)  . heparin 1,750 Units/hr (10/26/17 0706)  . milrinone 0.5 mcg/kg/min (10/26/17 1314)  . norepinephrine (LEVOPHED) Adult infusion 15.04 mcg/min (10/26/17 0600)   PRN Meds:.sodium chloride, acetaminophen (TYLENOL) oral liquid 160 mg/5 mL, albuterol, HYDROmorphone (DILAUDID) injection, magic mouthwash, midazolam, ondansetron (ZOFRAN) IV, sodium chloride flush, sodium chloride flush, sodium chloride flush    Objective: Weight change:   Intake/Output Summary (Last 24 hours) at 10/26/2017 1502 Last data filed at 10/26/2017 1057 Gross per 24 hour  Intake 1823.32 ml  Output 3455 ml  Net -1631.68 ml   Blood pressure 100/67, pulse (!) 130, temperature (!) 102.4 F (39.1 C), resp. rate (!) 25, height _0  (1.778 m), weight 234 lb (106.1 kg), SpO2 91 %. Temp:  [101.1 F (38.4 C)-102.4 F (39.1 C)] 102.4 F (39.1 C) (02/16 1240) Pulse Rate:  [52-156] 130 (02/16 1400) Resp:  [9-27] 25 (02/16 1400) BP: (74-123)/(48-99) 100/67 (02/16 1400) SpO2:  [88 %-95 %] 91 % (02/16 1400) Arterial Line BP: (88-117)/(53-72) 101/64 (02/16 1400) FiO2 (%):  [50 %] 50 % (02/16 1240)  Physical Exam: General: Extubated but not interactive \ HEENT: anicteric sclera, EOMI CVS echocardiac rate, normal r,  no murmur rubs or gallops Chest: rhonchi Abdomen: soft nontender, nondistended, normal bowel sounds, Extremities grossly edematous  skin: Central line and neck  femoral line in place lymph: no new lymphadenopathy Neuro: nonfocal  CBC: CBC Latest Ref Rng & Units 10/26/2017 10/25/2017 10/24/2017  WBC 4.0 - 10.5 K/uL 10.1 9.6 -  Hemoglobin 13.0 - 17.0 g/dL 9.4(L) 9.1(L) 9.5(L)  Hematocrit 39.0 - 52.0 % 30.4(L) 29.7(L) 28.0(L)  Platelets 150 - 400 K/uL 409(H) 422(H) -      BMET Recent Labs    10/25/17 0312 10/26/17 0359  NA 150* 147*  K 3.6 3.6  CL 115*  113*  CO2 22 22  GLUCOSE 94 179*  BUN 109* 104*  CREATININE 3.65* 3.68*  CALCIUM 7.7* 7.7*     Liver Panel  No results for input(s): PROT, ALBUMIN, AST, ALT, ALKPHOS, BILITOT, BILIDIR, IBILI in the last 72 hours.     Sedimentation Rate No results for input(s): ESRSEDRATE in the last 72 hours. C-Reactive Protein No results for input(s): CRP in the last 72 hours.  Micro Results: Recent Results (from the past 720 hour(s))  Culture, blood (Routine X 2) w Reflex to ID Panel     Status: Abnormal   Collection Time: 09/14/2017  1:08 PM  Result Value Ref Range Status   Specimen Description BLOOD RIGHT HAND  Final   Special Requests   Final    BOTTLES DRAWN AEROBIC AND ANAEROBIC Blood Culture adequate volume   Culture  Setup Time   Final    GRAM POSITIVE COCCI AEROBIC BOTTLE ONLY CRITICAL RESULT CALLED TO, READ BACK BY AND VERIFIED WITH: J LEDFORD PHARMD 0532 10/01/17 A BROWNING    Culture STREPTOCOCCUS PNEUMONIAE (A)  Final   Report Status 10/03/2017 FINAL  Final   Organism ID, Bacteria STREPTOCOCCUS PNEUMONIAE  Final      Susceptibility   Streptococcus pneumoniae - MIC*    ERYTHROMYCIN <=0.12 SENSITIVE Sensitive     LEVOFLOXACIN 0.5 SENSITIVE Sensitive     PENICILLIN (non-meningitis) 0.25 SENSITIVE Sensitive     CEFTRIAXONE (non-meningitis) <=0.12 SENSITIVE Sensitive     * STREPTOCOCCUS PNEUMONIAE  Blood Culture ID Panel (Reflexed)     Status: Abnormal   Collection Time: 10/05/2017  1:08 PM  Result Value Ref Range Status   Enterococcus species NOT DETECTED NOT DETECTED Final    Listeria monocytogenes NOT DETECTED NOT DETECTED Final   Staphylococcus species NOT DETECTED NOT DETECTED Final   Staphylococcus aureus NOT DETECTED NOT DETECTED Final   Streptococcus species DETECTED (A) NOT DETECTED Final    Comment: CRITICAL RESULT CALLED TO, READ BACK BY AND VERIFIED WITH: J LEDFORD PHARMD 0532 10/01/17 A BROWNING    Streptococcus agalactiae NOT DETECTED NOT DETECTED Final   Streptococcus pneumoniae DETECTED (A) NOT DETECTED Final    Comment: CRITICAL RESULT CALLED TO, READ BACK BY AND VERIFIED WITH: J LEDFORD PHARMD 0532 10/01/17 A BROWNING    Streptococcus pyogenes NOT DETECTED NOT DETECTED Final   Acinetobacter baumannii NOT DETECTED NOT DETECTED Final   Enterobacteriaceae species NOT DETECTED NOT DETECTED Final   Enterobacter cloacae complex NOT DETECTED NOT DETECTED Final   Escherichia coli NOT DETECTED NOT DETECTED Final   Klebsiella oxytoca NOT DETECTED NOT DETECTED Final   Klebsiella pneumoniae NOT DETECTED NOT DETECTED Final   Proteus species NOT DETECTED NOT DETECTED Final   Serratia marcescens NOT DETECTED NOT DETECTED Final   Haemophilus influenzae NOT DETECTED NOT DETECTED Final   Neisseria meningitidis NOT DETECTED NOT DETECTED Final   Pseudomonas aeruginosa NOT DETECTED NOT DETECTED Final   Candida albicans NOT DETECTED NOT DETECTED Final   Candida glabrata NOT DETECTED NOT DETECTED Final   Candida krusei NOT DETECTED NOT DETECTED Final   Candida parapsilosis NOT DETECTED NOT DETECTED Final   Candida tropicalis NOT DETECTED NOT DETECTED Final  Culture, blood (Routine X 2) w Reflex to ID Panel     Status: None   Collection Time: 09/22/2017  1:11 PM  Result Value Ref Range Status   Specimen Description BLOOD LEFT FOREARM  Final   Special Requests IN PEDIATRIC BOTTLE Blood Culture adequate volume  Final   Culture NO  GROWTH 5 DAYS  Final   Report Status 10/05/2017 FINAL  Final  Respiratory Panel by PCR     Status: Abnormal   Collection Time: 09/16/2017   1:51 PM  Result Value Ref Range Status   Adenovirus NOT DETECTED NOT DETECTED Final   Coronavirus 229E NOT DETECTED NOT DETECTED Final   Coronavirus HKU1 NOT DETECTED NOT DETECTED Final   Coronavirus NL63 NOT DETECTED NOT DETECTED Final   Coronavirus OC43 NOT DETECTED NOT DETECTED Final   Metapneumovirus NOT DETECTED NOT DETECTED Final   Rhinovirus / Enterovirus NOT DETECTED NOT DETECTED Final   Influenza A NOT DETECTED NOT DETECTED Final   Influenza B NOT DETECTED NOT DETECTED Final   Parainfluenza Virus 1 NOT DETECTED NOT DETECTED Final   Parainfluenza Virus 2 NOT DETECTED NOT DETECTED Final   Parainfluenza Virus 3 DETECTED (A) NOT DETECTED Final   Parainfluenza Virus 4 NOT DETECTED NOT DETECTED Final   Respiratory Syncytial Virus NOT DETECTED NOT DETECTED Final   Bordetella pertussis NOT DETECTED NOT DETECTED Final   Chlamydophila pneumoniae NOT DETECTED NOT DETECTED Final   Mycoplasma pneumoniae NOT DETECTED NOT DETECTED Final  Culture, blood (routine x 2)     Status: None   Collection Time: 09/27/2017  4:45 PM  Result Value Ref Range Status   Specimen Description BLOOD LEFT HAND  Final   Special Requests IN PEDIATRIC BOTTLE Blood Culture adequate volume  Final   Culture NO GROWTH 5 DAYS  Final   Report Status 10/05/2017 FINAL  Final  MRSA PCR Screening     Status: None   Collection Time: 10/01/2017  4:49 PM  Result Value Ref Range Status   MRSA by PCR NEGATIVE NEGATIVE Final    Comment:        The GeneXpert MRSA Assay (FDA approved for NASAL specimens only), is one component of a comprehensive MRSA colonization surveillance program. It is not intended to diagnose MRSA infection nor to guide or monitor treatment for MRSA infections.   Culture, blood (routine x 2)     Status: None   Collection Time: 09/11/2017  5:06 PM  Result Value Ref Range Status   Specimen Description BLOOD RIGHT HAND  Final   Special Requests IN PEDIATRIC BOTTLE Blood Culture adequate volume  Final    Culture NO GROWTH 5 DAYS  Final   Report Status 10/05/2017 FINAL  Final  Culture, blood (Routine X 2) w Reflex to ID Panel     Status: None   Collection Time: 09/12/2017  4:18 PM  Result Value Ref Range Status   Specimen Description BLOOD RIGHT ANTECUBITAL  Final   Special Requests IN PEDIATRIC BOTTLE Blood Culture adequate volume  Final   Culture   Final    NO GROWTH 5 DAYS Performed at [facility], [place] [address] [place], [place] [address]    Report Status 10/12/2017 FINAL  Final  Culture, blood (Routine X 2) w Reflex to ID Panel     Status: None   Collection Time: 09/28/2017  4:20 PM  Result Value Ref Range Status   Specimen Description BLOOD RIGHT ANTECUBITAL  Final   Special Requests IN PEDIATRIC BOTTLE Blood Culture adequate volume  Final   Culture   Final    NO GROWTH 5 DAYS Performed at [facility], [place] [address] [place], [place] [address]    Report Status 10/12/2017 FINAL  Final  Surgical pcr screen     Status: Abnormal   Collection Time: 10/01/2017  8:37 PM  Result Value Ref Range Status   MRSA, PCR RESULT CALLED TO, READ BACK BY AND VERIFIED WITH: (A) NEGATIVE Final    Comment:  M AMO RN 09/29/2017 0504 JDW   Staphylococcus aureus INVALID RESULTS, SPECIMEN SENT FOR CULTURE (A) NEGATIVE Final    Comment: Results Called to:  M [facility] 09/12/2017 0504 JDW (NOTE) The Xpert SA Assay (FDA approved for NASAL specimens in patients 73 years of age and older), is one component of a comprehensive surveillance program. It is not intended to diagnose infection nor to guide or monitor treatment.   MRSA culture     Status: None   Collection Time: 09/21/2017  8:37 PM  Result Value Ref Range Status   Specimen Description NASOPHARYNGEAL  Final   Special Requests NONE  Final   Culture NO MRSA DETECTED  Final   Report Status 10/10/2017 FINAL  Final  Culture, blood (routine x 2)     Status: None   Collection Time: 09/21/2017  5:52 PM  Result Value Ref Range Status   Specimen  Description BLOOD RIGHT ANTECUBITAL  Final   Special Requests IN PEDIATRIC BOTTLE Blood Culture adequate volume  Final   Culture   Final    NO GROWTH 5 DAYS Performed at [facility], [place] [address] [place], [place] [address]    Report Status 10/14/2017 FINAL  Final  Culture, blood (routine x 2)     Status: None   Collection Time: 10/04/2017  6:00 PM  Result Value Ref Range Status   Specimen Description BLOOD RIGHT HAND  Final   Special Requests IN PEDIATRIC BOTTLE Blood Culture adequate volume  Final   Culture   Final    NO GROWTH 5 DAYS Performed at [facility], [place] [address] [place], [place] [address]    Report Status 10/14/2017 FINAL  Final  Culture, respiratory (NON-Expectorated)     Status: None   Collection Time: 10/10/17  7:55 AM  Result Value Ref Range Status   Specimen Description TRACHEAL ASPIRATE  Final   Special Requests NONE  Final   Gram Stain   Final    RARE WBC PRESENT, PREDOMINANTLY MONONUCLEAR NO ORGANISMS SEEN Performed at [facility], [place] [address], [place], [place] [address]    Culture RARE CANDIDA ALBICANS  Final   Report Status 10/12/2017 FINAL  Final  Culture, bal-quantitative     Status: Abnormal   Collection Time: 10/11/17 11:11 AM  Result Value Ref Range Status   Specimen Description BRONCHIAL ALVEOLAR LAVAGE  Final   Special Requests NONE  Final   Gram Stain   Final    MODERATE WBC PRESENT,BOTH PMN AND MONONUCLEAR NO SQUAMOUS EPITHELIAL CELLS SEEN RARE BUDDING YEAST SEEN Performed at [facility], [place] [address] [place], [place] [address]    Culture 40,000 COLONIES/mL CANDIDA ALBICANS (A)  Final   Report Status 10/13/2017 FINAL  Final  Culture, respiratory (NON-Expectorated)     Status: None   Collection Time: 10/15/17  8:12 AM  Result Value Ref Range Status   Specimen Description TRACHEAL ASPIRATE  Final   Special Requests Normal  Final   Gram Stain   Final    FEW WBC PRESENT, PREDOMINANTLY PMN RARE GRAM  NEGATIVE RODS Performed at [facility], [place] [address], [place], [place] [address]    Culture FEW CANDIDA ALBICANS  Final   Report Status 10/18/2017 FINAL  Final  Culture, blood (Routine X 2) w Reflex  to ID Panel     Status: None   Collection Time: 10/15/17  8:52 AM  Result Value Ref Range Status   Specimen Description BLOOD RIGHT HAND  Final   Special Requests IN PEDIATRIC BOTTLE Blood Culture adequate volume  Final   Culture   Final    NO GROWTH 5 DAYS Performed at [facility], [place] [address], [place], [place] [address]    Report Status 10/20/2017 FINAL  Final  Culture, blood (Routine X 2) w Reflex to ID Panel     Status: None   Collection Time: 10/15/17 10:20 AM  Result Value Ref Range Status   Specimen Description BLOOD LEFT HAND  Final   Special Requests IN PEDIATRIC BOTTLE Blood Culture adequate volume  Final   Culture   Final    NO GROWTH 5 DAYS Performed at [facility], [place] [address] [place], [place] [address]    Report Status 10/20/2017 FINAL  Final  Culture, Urine     Status: None   Collection Time: 10/15/17 10:29 AM  Result Value Ref Range Status   Specimen Description URINE, CATHETERIZED  Final   Special Requests Normal  Final   Culture   Final    NO GROWTH Performed at [place] [address] [place], [place] [address]    Report Status 10/16/2017 FINAL  Final  Culture, respiratory (NON-Expectorated)     Status: None   Collection Time: 10/20/17  2:45 PM  Result Value Ref Range Status   Specimen Description TRACHEAL ASPIRATE  Final   Special Requests NONE  Final   Gram Stain   Final    MODERATE WBC PRESENT,BOTH PMN AND MONONUCLEAR RARE SQUAMOUS EPITHELIAL CELLS PRESENT NO ORGANISMS SEEN    Culture   Final    NO GROWTH 2 DAYS Performed at [facility], [place] [address], [place], [place] [address]    Report Status 10/22/2017 FINAL  Final  Culture, Urine     Status: None   Collection Time: 10/20/17  2:56 PM  Result  Value Ref Range Status   Specimen Description URINE, CATHETERIZED  Final   Special Requests Normal  Final   Culture   Final    NO GROWTH Performed at [facility], [place] [address], [place], [place] [address]    Report Status 10/21/2017 FINAL  Final  C difficile quick scan w PCR reflex     Status: None   Collection Time: 10/20/17  2:56 PM  Result Value Ref Range Status   C Diff antigen NEGATIVE NEGATIVE Final   C Diff toxin NEGATIVE NEGATIVE Final   C Diff interpretation No C. difficile detected.  Final    Comment: Performed at [facility], [place] [address] [place], [place] [address]  Culture, blood (routine x 2)     Status: None   Collection Time: 10/20/17  3:18 PM  Result Value Ref Range Status   Specimen Description BLOOD RIGHT HAND  Final   Special Requests IN PEDIATRIC BOTTLE Blood Culture adequate volume  Final   Culture   Final    NO GROWTH 5 DAYS Performed at [facility], [place] [address] [place],  [address]    Report Status 10/25/2017 FINAL  Final  Culture, blood (routine x 2)     Status: None   Collection Time: 10/20/17  3:24 PM  Result Value Ref Range Status   Specimen Description BLOOD LEFT HAND  Final  Special Requests IN PEDIATRIC BOTTLE Blood Culture adequate volume  Final   Culture   Final    NO GROWTH 5 DAYS Performed at [facility], [place] [address], [place], [place] [address]    Report Status 10/25/2017 FINAL  Final  Culture, blood (routine x 2)     Status: None (Preliminary result)   Collection Time: 10/25/17 10:58 AM  Result Value Ref Range Status   Specimen Description BLOOD LEFT HAND  Final   Special Requests IN PEDIATRIC BOTTLE Blood Culture adequate volume  Final   Culture  Setup Time   Final    GRAM POSITIVE COCCI GRAM POSITIVE RODS IN PEDIATRIC BOTTLE CRITICAL RESULT CALLED TO, READ BACK BY AND VERIFIED WITH: J LEDFORD PHARMD 10/26/17 0300 JDW    Culture   Final    NO GROWTH 1 DAY Performed at [place], [place] [address] [place], [place] [address]    Report Status PENDING  Incomplete  Culture, blood (routine x 2)     Status: None (Preliminary result)   Collection Time: 10/25/17 11:12 AM  Result Value Ref Range Status   Specimen Description BLOOD RIGHT HAND  Final   Special Requests IN PEDIATRIC BOTTLE Blood Culture adequate volume  Final   Culture  Setup Time   Final    GRAM POSITIVE COCCI GRAM POSITIVE RODS IN PEDIATRIC BOTTLE CRITICAL RESULT CALLED TO, READ BACK BY AND VERIFIED WITH: J LEDFORD PHARMD 10/26/17 0300 JDW Performed at [facility], [place] [address] [place], [place] [address]    Culture GRAM POSITIVE COCCI  Final   Report Status PENDING  Incomplete  Blood Culture ID Panel (Reflexed)     Status: Abnormal   Collection Time: 10/25/17 11:12 AM  Result Value Ref Range Status   Enterococcus species NOT DETECTED NOT DETECTED Final   Listeria monocytogenes NOT DETECTED NOT DETECTED Final   Staphylococcus species DETECTED (A) NOT DETECTED Final    Comment: CRITICAL RESULT CALLED TO, READ BACK BY AND VERIFIED WITH:  J LEDFORD PHARMD 0300 10/26/17 JDW    Staphylococcus aureus DETECTED (A) NOT DETECTED Final    Comment: Methicillin (oxacillin)-resistant Staphylococcus aureus (MRSA). MRSA is predictably resistant to beta-lactam antibiotics (except ceftaroline). Preferred therapy is vancomycin unless clinically contraindicated. Patient requires contact precautions if  hospitalized. CRITICAL RESULT CALLED TO, READ BACK BY AND VERIFIED WITH: J LEDFORD PHARMD 10/26/17 0300 JDW    Methicillin resistance DETECTED (A) NOT DETECTED Final    Comment: CRITICAL RESULT CALLED TO, READ BACK BY AND VERIFIED WITH: J LEDFORD PHARMD 10/26/17 0300 JDW    Streptococcus species NOT DETECTED NOT DETECTED Final   Streptococcus agalactiae NOT DETECTED NOT DETECTED Final   Streptococcus pneumoniae NOT DETECTED NOT DETECTED Final   Streptococcus pyogenes NOT DETECTED NOT DETECTED Final    Acinetobacter baumannii NOT DETECTED NOT DETECTED Final   Enterobacteriaceae species NOT DETECTED NOT DETECTED Final   Enterobacter cloacae complex NOT DETECTED NOT DETECTED Final   Escherichia coli NOT DETECTED NOT DETECTED Final   Klebsiella oxytoca NOT DETECTED NOT DETECTED Final   Klebsiella pneumoniae NOT DETECTED NOT DETECTED Final   Proteus species NOT DETECTED NOT DETECTED Final   Serratia marcescens NOT DETECTED NOT DETECTED Final   Haemophilus influenzae NOT DETECTED NOT DETECTED Final   Neisseria meningitidis NOT DETECTED NOT DETECTED Final   Pseudomonas aeruginosa NOT DETECTED NOT DETECTED Final   Candida albicans NOT DETECTED NOT DETECTED Final   Candida glabrata NOT DETECTED NOT DETECTED Final  Candida krusei NOT DETECTED NOT DETECTED Final   Candida parapsilosis NOT DETECTED NOT DETECTED Final   Candida tropicalis NOT DETECTED NOT DETECTED Final    Comment: Performed at [facility], [place] [address], [place], [place] [address]    Studies/Results: No results found.    Assessment/Plan:  INTERVAL HISTORY: MRSA isolated on blood cultures   Principal Problem:   FUO (fever of unknown origin) Active Problems:   Atrial fibrillation with RVR (HCC)   Acute on chronic systolic heart failure (HCC)   Encounter for central line care   Shock circulatory (HCC)   Malnutrition of moderate degree   Cardiogenic shock (HCC)   LVAD (left ventricular assist device) present ([place])   HCAP (healthcare-associated pneumonia)   Pressure injury of skin   AKI (acute kidney injury) ([place])   Central line infiltration (HCC)   CHF (congestive heart failure) ([place])   Community acquired pneumonia of right lower lobe of lung ([place])   Hemothorax on right   Hx of chest tube placement   Pneumococcal bacteremia   Focal seizure (HCC)   Unresponsiveness   Seizure ([place])   Acute bacterial endocarditis   Empyema ([place])   Acute respiratory failure ([place])   Palliative care encounter   Goals of  care, counseling/discussion    Bryan Harmon is a 67 y.o. 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   In addition to his extensive hemothorax he was found to have a loculated pleural effusion and a necrotic pneumonia on imaging.  He has been clearly not a candidate for decortication and surgery.  I had him on Teflaro to cover both the pneumococcus we knew about and to cover MRSA.  Ultimately when the patient continued to have fevers the family had asked for second opinion.  My partner Dr. Megan Salon saw the patient and suggested a holiday from the beta-lactam in case it was causing the fever.  Family agreed to this and fevers did persist now the patient has been found to be bacteremic with methicillin resistant Staphylococcus aureus.   I have explained to the son that his father and wife that her husband is at the and and that he has a situation that clinically is not solvable and that he is coming to the end of his life.  I told them that the office finding the MRSA was another piece of very bad news.  As mentioned previously MRSA has a 25% mortality for all comers.  In this patient clearly that mortality rate is nearing 100%  I explained that the only way he could survive this infection would be for him to have cardiothoracic surgery which I have already explained and nauseum is not a possibility.  Furthermoe all of his lines would have to be removed and that cannot possibly be done with him being on pressors.  We will continue Teflaro.  I will follow-up culture data.  Dr. Baxter Flattery will be on on Monday but I would prefer NOT to have to involve her in this case which has been exhaustive.   I STRONGLY RECOMMEND an ETHICS CONSULT as we are practicing FUTILE care in a patient whose medical decision makers do NOT have  a grasp of the big picture but perseverate on hopes and small picture issues that event themselves are not solveable.  So I will be around for questions  tomorrow but will then take him off our rounding list.   LOS: 26 days   Alcide Evener 10/26/2017, 3:02 PM

## 2017-10-27 DIAGNOSIS — B9562 Methicillin resistant Staphylococcus aureus infection as the cause of diseases classified elsewhere: Secondary | ICD-10-CM

## 2017-10-27 DIAGNOSIS — R7881 Bacteremia: Secondary | ICD-10-CM

## 2017-10-27 DIAGNOSIS — T80219A Unspecified infection due to central venous catheter, initial encounter: Secondary | ICD-10-CM

## 2017-10-27 DIAGNOSIS — R0689 Other abnormalities of breathing: Secondary | ICD-10-CM

## 2017-10-27 LAB — GLUCOSE, CAPILLARY
GLUCOSE-CAPILLARY: 121 mg/dL — AB (ref 65–99)
Glucose-Capillary: 126 mg/dL — ABNORMAL HIGH (ref 65–99)
Glucose-Capillary: 102 mg/dL — ABNORMAL HIGH (ref 65–99)
Glucose-Capillary: 150 mg/dL — ABNORMAL HIGH (ref 65–99)
Glucose-Capillary: 156 mg/dL — ABNORMAL HIGH (ref 65–99)

## 2017-10-27 LAB — CBC WITH DIFFERENTIAL/PLATELET
Basophils Absolute: 0 10*3/uL (ref 0.0–0.1)
Basophils Relative: 0 %
Eosinophils Absolute: 0 10*3/uL (ref 0.0–0.7)
Eosinophils Relative: 0 %
HEMATOCRIT: 31.1 % — AB (ref 39.0–52.0)
HEMOGLOBIN: 9.4 g/dL — AB (ref 13.0–17.0)
LYMPHS ABS: 0.7 10*3/uL (ref 0.7–4.0)
LYMPHS PCT: 6 %
MCH: 30 pg (ref 26.0–34.0)
MCHC: 30.2 g/dL (ref 30.0–36.0)
MCV: 99.4 fL (ref 78.0–100.0)
MONOS PCT: 4 %
Monocytes Absolute: 0.5 10*3/uL (ref 0.1–1.0)
NEUTROS ABS: 10.5 10*3/uL — AB (ref 1.7–7.7)
Neutrophils Relative %: 90 %
Platelets: 405 10*3/uL — ABNORMAL HIGH (ref 150–400)
RBC: 3.13 MIL/uL — AB (ref 4.22–5.81)
RDW: 20.8 % — ABNORMAL HIGH (ref 11.5–15.5)
WBC: 11.6 10*3/uL — AB (ref 4.0–10.5)

## 2017-10-27 LAB — MAGNESIUM: Magnesium: 2.1 mg/dL (ref 1.7–2.4)

## 2017-10-27 LAB — BASIC METABOLIC PANEL
ANION GAP: 14 (ref 5–15)
BUN: 105 mg/dL — ABNORMAL HIGH (ref 6–20)
CHLORIDE: 113 mmol/L — AB (ref 101–111)
CO2: 23 mmol/L (ref 22–32)
CREATININE: 3.65 mg/dL — AB (ref 0.61–1.24)
Calcium: 7.6 mg/dL — ABNORMAL LOW (ref 8.9–10.3)
GFR calc non Af Amer: 16 mL/min — ABNORMAL LOW (ref >60)
GFR, EST AFRICAN AMERICAN: 19 mL/min — AB (ref >60)
Glucose, Bld: 109 mg/dL — ABNORMAL HIGH (ref 65–99)
Potassium: 3.5 mmol/L (ref 3.5–5.1)
SODIUM: 150 mmol/L — AB (ref 135–145)

## 2017-10-27 LAB — COOXEMETRY PANEL
Carboxyhemoglobin: 2.4 % — ABNORMAL HIGH (ref 0.5–1.5)
Methemoglobin: 0.8 % (ref 0.0–1.5)
O2 Saturation: 89.7 %
Total hemoglobin: 9.5 g/dL — ABNORMAL LOW (ref 12.0–16.0)

## 2017-10-27 LAB — HEPARIN LEVEL (UNFRACTIONATED): HEPARIN UNFRACTIONATED: 0.36 [IU]/mL (ref 0.30–0.70)

## 2017-10-27 LAB — LACTATE DEHYDROGENASE: LDH: 462 U/L — ABNORMAL HIGH (ref 98–192)

## 2017-10-27 MED ORDER — SODIUM CHLORIDE 0.9 % IV SOLN
10.0000 mg/h | INTRAVENOUS | Status: DC
  Administered 2017-10-27 (×3): 5 mg/h via INTRAVENOUS
  Filled 2017-10-27: qty 10

## 2017-10-27 MED ORDER — LORAZEPAM 2 MG/ML IJ SOLN
1.0000 mg | INTRAMUSCULAR | Status: DC | PRN
  Administered 2017-10-27: 1 mg via INTRAVENOUS
  Filled 2017-10-27: qty 1

## 2017-10-27 MED ORDER — LORAZEPAM 2 MG/ML IJ SOLN
15.0000 mg/h | INTRAMUSCULAR | Status: DC
  Administered 2017-10-27: 5 mg/h via INTRAVENOUS
  Administered 2017-10-27 – 2017-10-28 (×2): 15 mg/h via INTRAVENOUS
  Filled 2017-10-27 (×4): qty 25

## 2017-10-27 MED ORDER — CLINDAMYCIN PHOSPHATE 600 MG/50ML IV SOLN
600.0000 mg | Freq: Three times a day (TID) | INTRAVENOUS | Status: DC
  Administered 2017-10-27 (×2): 600 mg via INTRAVENOUS
  Filled 2017-10-27: qty 50

## 2017-10-27 MED ORDER — MORPHINE SULFATE (PF) 4 MG/ML IV SOLN
2.0000 mg | INTRAVENOUS | Status: DC | PRN
  Administered 2017-10-27: 2 mg via INTRAVENOUS
  Filled 2017-10-27: qty 1

## 2017-10-27 MED ORDER — LORAZEPAM BOLUS VIA INFUSION: 1.0000 mg | INTRAVENOUS | Status: DC | PRN

## 2017-10-27 MED ORDER — LORAZEPAM 2 MG/ML IJ SOLN: 5.0000 mg | Freq: Once | INTRAMUSCULAR | Status: DC

## 2017-10-27 NOTE — Progress Notes (Signed)
Patient having periods of apnea. Wife asked nurse to give him something for pain. Morphine 2mg  given. Concerned patient is actively dying. Physicians and I have talked with the wife and son all day about where his condition is heading and they insist that he is not dying and don't want to give up. Will ask Dr.Bensimhon to speak with family and to re-evaluate patients change in condition.

## 2017-10-27 NOTE — Progress Notes (Signed)
Dr.Bensimhon at bedside. Patient gasping for air, in obvious distress. Family finally agreed to move to comfort care. Will start Morphine and Ativan drip when available. Will stop other medications when Morphine and Ativan are available and patient is comfortable.

## 2017-10-27 NOTE — Progress Notes (Signed)
Subjective:  Patient not communicating much with me but more with family when he was communicating   Antibiotics:  Anti-infectives (From admission, onward)   Start     Dose/Rate Route Frequency Ordered Stop   10/27/17 0945  clindamycin (CLEOCIN) IVPB 600 mg     600 mg 100 mL/hr over 30 Minutes Intravenous Every 8 hours 10/27/17 0940     10/26/17 1000  ceftaroline (TEFLARO) 300 mg in sodium chloride 0.9 % 250 mL IVPB    Comments:  Teflaro 300 mg IV q12h for CrCl < 30 mL/min   300 mg 250 mL/hr over 60 Minutes Intravenous Every 12 hours 10/26/17 0801     10/22/17 1200  ceftaroline (TEFLARO) 300 mg in sodium chloride 0.9 % 250 mL IVPB  Status:  Discontinued     300 mg 250 mL/hr over 60 Minutes Intravenous Every 12 hours 10/22/17 1055 10/23/17 1546   10/15/17 1200  ceftaroline (TEFLARO) 400 mg in sodium chloride 0.9 % 250 mL IVPB  Status:  Discontinued     400 mg 250 mL/hr over 60 Minutes Intravenous Every 12 hours 10/15/17 1052 10/22/17 1055   10/15/17 1100  metroNIDAZOLE (FLAGYL) IVPB 500 mg  Status:  Discontinued     500 mg 100 mL/hr over 60 Minutes Intravenous Every 8 hours 10/15/17 1052 10/23/17 1546   10/13/17 1200  anidulafungin (ERAXIS) 100 mg in sodium chloride 0.9 % 100 mL IVPB  Status:  Discontinued     100 mg 78 mL/hr over 100 Minutes Intravenous Every 24 hours 10/12/17 1123 10/14/17 1238   10/12/17 1200  anidulafungin (ERAXIS) 200 mg in sodium chloride 0.9 % 200 mL IVPB     200 mg 78 mL/hr over 200 Minutes Intravenous  Once 10/12/17 1123 10/12/17 1702   10/03/2017 2000  vancomycin (VANCOCIN) IVPB 1000 mg/200 mL premix  Status:  Discontinued     1,000 mg 200 mL/hr over 60 Minutes Intravenous Every 24 hours 09/20/2017 1754 10/14/17 0951   09/15/2017 1800  meropenem (MERREM) 1 g in sodium chloride 0.9 % 100 mL IVPB  Status:  Discontinued     1 g 200 mL/hr over 30 Minutes Intravenous Every 12 hours 09/27/2017 1720 10/15/17 1052   10/05/2017 0912  vancomycin (VANCOCIN)  1,000 mg in sodium chloride 0.9 % 1,000 mL irrigation  Status:  Discontinued       As needed 09/11/2017 0912 10/10/2017 1115   09/24/2017 0700  fluconazole (DIFLUCAN) IVPB 400 mg  Status:  Discontinued     400 mg 100 mL/hr over 120 Minutes Intravenous To Surgery 09/26/2017 0650 09/29/2017 1115   09/17/2017 0700  rifampin (RIFADIN) 600 mg in sodium chloride 0.9 % 100 mL IVPB  Status:  Discontinued     600 mg 200 mL/hr over 30 Minutes Intravenous To Surgery 09/19/2017 0650 09/27/2017 1115   09/21/2017 0700  vancomycin (VANCOCIN) powder 1,000 mg  Status:  Discontinued     1,000 mg Other To Surgery 10/03/2017 0650 10/10/2017 1115   09/13/2017 0400  vancomycin (VANCOCIN) 1,250 mg in sodium chloride 0.9 % 250 mL IVPB     1,250 mg 166.7 mL/hr over 90 Minutes Intravenous To Surgery 09/17/2017 1910 09/19/2017 1222   10/06/2017 0400  cefUROXime (ZINACEF) 1.5 g in dextrose 5 % 50 mL IVPB     1.5 g 100 mL/hr over 30 Minutes Intravenous To Surgery 09/11/2017 1910 09/22/2017 1202   09/20/2017 0400  cefUROXime (ZINACEF) 750 mg in dextrose 5 % 50 mL IVPB  Status:  Discontinued     750 mg 100 mL/hr over 30 Minutes Intravenous To Surgery 09/18/2017 1910 09/21/2017 1115   10/01/17 0600  cefTRIAXone (ROCEPHIN) 2 g in dextrose 5 % 50 mL IVPB  Status:  Discontinued     2 g 100 mL/hr over 30 Minutes Intravenous Every 24 hours 10/01/17 0541 09/12/2017 1705   09/22/2017 1315  doxycycline (VIBRA-TABS) tablet 100 mg  Status:  Discontinued     100 mg Oral Every 12 hours 09/22/2017 1301 10/02/17 1346   09/23/2017 1315  cefTRIAXone (ROCEPHIN) 1 g in dextrose 5 % 50 mL IVPB  Status:  Discontinued     1 g 100 mL/hr over 30 Minutes Intravenous Every 24 hours 09/22/2017 1301 10/01/17 0541      Medications: Scheduled Meds: . chlorhexidine gluconate (MEDLINE KIT)  15 mL Mouth Rinse BID  . Chlorhexidine Gluconate Cloth  6 each Topical Daily  . famotidine  20 mg Intravenous Q24H  . feeding supplement (PRO-STAT SUGAR FREE 64)  30 mL Per Tube BID  . free water  200 mL  Per Tube Q6H  . Gerhardt's butt cream   Topical BID  . glycopyrrolate  0.2 mg Intravenous Q4H  . insulin aspart  2-6 Units Subcutaneous Q4H  . insulin glargine  12 Units Subcutaneous BID  . mouth rinse  15 mL Mouth Rinse 10 times per day  . sennosides  5 mL Oral Daily  . sodium chloride flush  3 mL Intravenous Q12H  . sodium chloride flush  3 mL Intravenous Q12H   Continuous Infusions: . sodium chloride 250 mL (10/25/17 1132)  . sodium chloride    . amiodarone 30 mg/hr (10/27/17 1013)  . ceFTAROline (TEFLARO) IV Stopped (10/27/17 1121)  . clindamycin (CLEOCIN) IV 600 mg (10/27/17 1138)  . dexmedetomidine (PRECEDEX) IV infusion for high rates 1.1 mcg/kg/hr (10/27/17 1301)  . feeding supplement (VITAL 1.5 CAL) 1,000 mL (10/27/17 0600)  . furosemide (LASIX) infusion 15 mg/hr (10/27/17 0600)  . heparin 1,750 Units/hr (10/27/17 1013)  . milrinone 0.5 mcg/kg/min (10/27/17 1013)  . norepinephrine (LEVOPHED) Adult infusion 25 mcg/min (10/27/17 0659)   PRN Meds:.sodium chloride, acetaminophen (TYLENOL) oral liquid 160 mg/5 mL, albuterol, HYDROmorphone (DILAUDID) injection, magic mouthwash, midazolam, ondansetron (ZOFRAN) IV, sodium chloride flush, sodium chloride flush, sodium chloride flush    Objective: Weight change:   Intake/Output Summary (Last 24 hours) at 10/27/2017 1312 Last data filed at 10/27/2017 0600 Gross per 24 hour  Intake 3002.15 ml  Output 2745 ml  Net 257.15 ml   Blood pressure 95/77, pulse (!) 140, temperature (!) 101.5 F (38.6 C), resp. rate (!) 22, height _0  (1.778 m), weight 216 lb (98 kg), SpO2 93 %. Temp:  [100.9 F (38.3 C)-102.9 F (39.4 C)] 101.5 F (38.6 C) (02/17 1100) Pulse Rate:  [25-153] 140 (02/17 1100) Resp:  [14-25] 22 (02/17 1100) BP: (84-114)/(63-96) 95/77 (02/17 1100) SpO2:  [88 %-95 %] 93 % (02/17 1100) Arterial Line BP: (101-122)/(64-79) 115/77 (02/17 1100) FiO2 (%):  [50 %] 50 % (02/16 1600) Weight:  [216 lb (98 kg)-226 lb (102.5  kg)] 216 lb (98 kg) (02/17 0353)  Physical Exam: General: Extubated on multiple pressors HEENT: anicteric sclera, EOMI CVS echocardiac rate, normal r,  no murmur rubs or gallops Chest: rhonchi Abdomen: soft nontender, nondistended, normal bowel sounds, Extremities grossly edematous  skin: Central line and neck femoral line in place Neuro: nonfocal  CBC: CBC Latest Ref Rng & Units 10/27/2017 10/26/2017 10/26/2017  WBC 4.0 - 10.5  K/uL 11.6(H) - 10.1  Hemoglobin 13.0 - 17.0 g/dL 9.4(L) 9.5(L) 9.4(L)  Hematocrit 39.0 - 52.0 % 31.1(L) 28.0(L) 30.4(L)  Platelets 150 - 400 K/uL 405(H) - 409(H)      BMET Recent Labs    10/26/17 0359 10/26/17 1817 10/27/17 0723  NA 147* 150* 150*  K 3.6 3.4* 3.5  CL 113* 112* 113*  CO2 22  --  23  GLUCOSE 179* 143* 109*  BUN 104* 91* 105*  CREATININE 3.68* 3.50* 3.65*  CALCIUM 7.7*  --  7.6*     Liver Panel  No results for input(s): PROT, ALBUMIN, AST, ALT, ALKPHOS, BILITOT, BILIDIR, IBILI in the last 72 hours.     Sedimentation Rate No results for input(s): ESRSEDRATE in the last 72 hours. C-Reactive Protein No results for input(s): CRP in the last 72 hours.  Micro Results: Recent Results (from the past 720 hour(s))  Culture, blood (Routine X 2) w Reflex to ID Panel     Status: Abnormal   Collection Time: 09/10/2017  1:08 PM  Result Value Ref Range Status   Specimen Description BLOOD RIGHT HAND  Final   Special Requests   Final    BOTTLES DRAWN AEROBIC AND ANAEROBIC Blood Culture adequate volume   Culture  Setup Time   Final    GRAM POSITIVE COCCI AEROBIC BOTTLE ONLY CRITICAL RESULT CALLED TO, READ BACK BY AND VERIFIED WITH: J LEDFORD PHARMD 0532 10/01/17 A BROWNING    Culture STREPTOCOCCUS PNEUMONIAE (A)  Final   Report Status 10/03/2017 FINAL  Final   Organism ID, Bacteria STREPTOCOCCUS PNEUMONIAE  Final      Susceptibility   Streptococcus pneumoniae - MIC*    ERYTHROMYCIN <=0.12 SENSITIVE Sensitive     LEVOFLOXACIN 0.5  SENSITIVE Sensitive     PENICILLIN (non-meningitis) 0.25 SENSITIVE Sensitive     CEFTRIAXONE (non-meningitis) <=0.12 SENSITIVE Sensitive     * STREPTOCOCCUS PNEUMONIAE  Blood Culture ID Panel (Reflexed)     Status: Abnormal   Collection Time: 09/27/2017  1:08 PM  Result Value Ref Range Status   Enterococcus species NOT DETECTED NOT DETECTED Final   Listeria monocytogenes NOT DETECTED NOT DETECTED Final   Staphylococcus species NOT DETECTED NOT DETECTED Final   Staphylococcus aureus NOT DETECTED NOT DETECTED Final   Streptococcus species DETECTED (A) NOT DETECTED Final    Comment: CRITICAL RESULT CALLED TO, READ BACK BY AND VERIFIED WITH: J LEDFORD PHARMD 0532 10/01/17 A BROWNING    Streptococcus agalactiae NOT DETECTED NOT DETECTED Final   Streptococcus pneumoniae DETECTED (A) NOT DETECTED Final    Comment: CRITICAL RESULT CALLED TO, READ BACK BY AND VERIFIED WITH: J LEDFORD PHARMD 0532 10/01/17 A BROWNING    Streptococcus pyogenes NOT DETECTED NOT DETECTED Final   Acinetobacter baumannii NOT DETECTED NOT DETECTED Final   Enterobacteriaceae species NOT DETECTED NOT DETECTED Final   Enterobacter cloacae complex NOT DETECTED NOT DETECTED Final   Escherichia coli NOT DETECTED NOT DETECTED Final   Klebsiella oxytoca NOT DETECTED NOT DETECTED Final   Klebsiella pneumoniae NOT DETECTED NOT DETECTED Final   Proteus species NOT DETECTED NOT DETECTED Final   Serratia marcescens NOT DETECTED NOT DETECTED Final   Haemophilus influenzae NOT DETECTED NOT DETECTED Final   Neisseria meningitidis NOT DETECTED NOT DETECTED Final   Pseudomonas aeruginosa NOT DETECTED NOT DETECTED Final   Candida albicans NOT DETECTED NOT DETECTED Final   Candida glabrata NOT DETECTED NOT DETECTED Final   Candida krusei NOT DETECTED NOT DETECTED Final   Candida parapsilosis  NOT DETECTED NOT DETECTED Final   Candida tropicalis NOT DETECTED NOT DETECTED Final  Culture, blood (Routine X 2) w Reflex to ID Panel     Status:  None   Collection Time: 09/25/2017  1:11 PM  Result Value Ref Range Status   Specimen Description BLOOD LEFT FOREARM  Final   Special Requests IN PEDIATRIC BOTTLE Blood Culture adequate volume  Final   Culture NO GROWTH 5 DAYS  Final   Report Status 10/05/2017 FINAL  Final  Respiratory Panel by PCR     Status: Abnormal   Collection Time: 09/21/2017  1:51 PM  Result Value Ref Range Status   Adenovirus NOT DETECTED NOT DETECTED Final   Coronavirus 229E NOT DETECTED NOT DETECTED Final   Coronavirus HKU1 NOT DETECTED NOT DETECTED Final   Coronavirus NL63 NOT DETECTED NOT DETECTED Final   Coronavirus OC43 NOT DETECTED NOT DETECTED Final   Metapneumovirus NOT DETECTED NOT DETECTED Final   Rhinovirus / Enterovirus NOT DETECTED NOT DETECTED Final   Influenza A NOT DETECTED NOT DETECTED Final   Influenza B NOT DETECTED NOT DETECTED Final   Parainfluenza Virus 1 NOT DETECTED NOT DETECTED Final   Parainfluenza Virus 2 NOT DETECTED NOT DETECTED Final   Parainfluenza Virus 3 DETECTED (A) NOT DETECTED Final   Parainfluenza Virus 4 NOT DETECTED NOT DETECTED Final   Respiratory Syncytial Virus NOT DETECTED NOT DETECTED Final   Bordetella pertussis NOT DETECTED NOT DETECTED Final   Chlamydophila pneumoniae NOT DETECTED NOT DETECTED Final   Mycoplasma pneumoniae NOT DETECTED NOT DETECTED Final  Culture, blood (routine x 2)     Status: None   Collection Time: 10/01/2017  4:45 PM  Result Value Ref Range Status   Specimen Description BLOOD LEFT HAND  Final   Special Requests IN PEDIATRIC BOTTLE Blood Culture adequate volume  Final   Culture NO GROWTH 5 DAYS  Final   Report Status 10/05/2017 FINAL  Final  MRSA PCR Screening     Status: None   Collection Time: 09/26/2017  4:49 PM  Result Value Ref Range Status   MRSA by PCR NEGATIVE NEGATIVE Final    Comment:        The GeneXpert MRSA Assay (FDA approved for NASAL specimens only), is one component of a comprehensive MRSA colonization surveillance  program. It is not intended to diagnose MRSA infection nor to guide or monitor treatment for MRSA infections.   Culture, blood (routine x 2)     Status: None   Collection Time: 09/29/2017  5:06 PM  Result Value Ref Range Status   Specimen Description BLOOD RIGHT HAND  Final   Special Requests IN PEDIATRIC BOTTLE Blood Culture adequate volume  Final   Culture NO GROWTH 5 DAYS  Final   Report Status 10/05/2017 FINAL  Final  Culture, blood (Routine X 2) w Reflex to ID Panel     Status: None   Collection Time: 09/21/2017  4:18 PM  Result Value Ref Range Status   Specimen Description BLOOD RIGHT ANTECUBITAL  Final   Special Requests IN PEDIATRIC BOTTLE Blood Culture adequate volume  Final   Culture   Final    NO GROWTH 5 DAYS Performed at [facility], [place] [address], [place], [place] [address]    Report Status 10/12/2017 FINAL  Final  Culture, blood (Routine X 2) w Reflex to ID Panel     Status: None   Collection Time: 10/06/2017  4:20 PM  Result Value Ref Range Status  Specimen Description BLOOD RIGHT ANTECUBITAL  Final   Special Requests IN PEDIATRIC BOTTLE Blood Culture adequate volume  Final   Culture   Final    NO GROWTH 5 DAYS Performed at [facility], [address], [place], [place] [address]    Report Status 10/12/2017 FINAL  Final  Surgical pcr screen     Status: Abnormal   Collection Time: 10/05/2017  8:37 PM  Result Value Ref Range Status   MRSA, PCR RESULT CALLED TO, READ BACK BY AND VERIFIED WITH: (A) NEGATIVE Final    Comment:  M AMO RN 09/12/2017 0504 JDW   Staphylococcus aureus INVALID RESULTS, SPECIMEN SENT FOR CULTURE (A) NEGATIVE Final    Comment: Results Called to:  M [facility] 09/12/2017 0504 JDW (NOTE) The Xpert SA Assay (FDA approved for NASAL specimens in patients 46 years of age and older), is one component of a comprehensive surveillance program. It is not intended to diagnose infection nor to guide or monitor treatment.   MRSA culture     Status: None     Collection Time: 09/16/2017  8:37 PM  Result Value Ref Range Status   Specimen Description NASOPHARYNGEAL  Final   Special Requests NONE  Final   Culture NO MRSA DETECTED  Final   Report Status 10/10/2017 FINAL  Final  Culture, blood (routine x 2)     Status: None   Collection Time: 09/27/2017  5:52 PM  Result Value Ref Range Status   Specimen Description BLOOD RIGHT ANTECUBITAL  Final   Special Requests IN PEDIATRIC BOTTLE Blood Culture adequate volume  Final   Culture   Final    NO GROWTH 5 DAYS Performed at [facility], [place] [address], [place], [place] [address]    Report Status 10/14/2017 FINAL  Final  Culture, blood (routine x 2)     Status: None   Collection Time: 09/18/2017  6:00 PM  Result Value Ref Range Status   Specimen Description BLOOD RIGHT HAND  Final   Special Requests IN PEDIATRIC BOTTLE Blood Culture adequate volume  Final   Culture   Final    NO GROWTH 5 DAYS Performed at [facility], [place] [address] [place], [place] [address]    Report Status 10/14/2017 FINAL  Final  Culture, respiratory (NON-Expectorated)     Status: None   Collection Time: 10/10/17  7:55 AM  Result Value Ref Range Status   Specimen Description TRACHEAL ASPIRATE  Final   Special Requests NONE  Final   Gram Stain   Final    RARE WBC PRESENT, PREDOMINANTLY MONONUCLEAR NO ORGANISMS SEEN Performed at [facility], [place] [address] [place], [place] [address]    Culture RARE CANDIDA ALBICANS  Final   Report Status 10/12/2017 FINAL  Final  Culture, bal-quantitative     Status: Abnormal   Collection Time: 10/11/17 11:11 AM  Result Value Ref Range Status   Specimen Description BRONCHIAL ALVEOLAR LAVAGE  Final   Special Requests NONE  Final   Gram Stain   Final    MODERATE WBC PRESENT,BOTH PMN AND MONONUCLEAR NO SQUAMOUS EPITHELIAL CELLS SEEN RARE BUDDING YEAST SEEN Performed at [facility], [place] [address] [place], [place] [address]    Culture 40,000 COLONIES/mL  CANDIDA ALBICANS (A)  Final   Report Status 10/13/2017 FINAL  Final  Culture, respiratory (NON-Expectorated)     Status: None   Collection Time: 10/15/17  8:12 AM  Result Value Ref Range Status  Specimen Description TRACHEAL ASPIRATE  Final   Special Requests Normal  Final   Gram Stain   Final    FEW WBC PRESENT, PREDOMINANTLY PMN RARE GRAM NEGATIVE RODS Performed at [facility], [place] [address], [place], [place] [address]    Culture FEW CANDIDA ALBICANS  Final   Report Status 10/18/2017 FINAL  Final  Culture, blood (Routine X 2) w Reflex to ID Panel     Status: None   Collection Time: 10/15/17  8:52 AM  Result Value Ref Range Status   Specimen Description BLOOD RIGHT HAND  Final   Special Requests IN PEDIATRIC BOTTLE Blood Culture adequate volume  Final   Culture   Final    NO GROWTH 5 DAYS Performed at [facility], [place] [address] [place], [place] [address]    Report Status 10/20/2017 FINAL  Final  Culture, blood (Routine X 2) w Reflex to ID Panel     Status: None   Collection Time: 10/15/17 10:20 AM  Result Value Ref Range Status   Specimen Description BLOOD LEFT HAND  Final   Special Requests IN PEDIATRIC BOTTLE Blood Culture adequate volume  Final   Culture   Final    NO GROWTH 5 DAYS Performed at [facility], [place] [address], [place], [place] [address]    Report Status 10/20/2017 FINAL  Final  Culture, Urine     Status: None   Collection Time: 10/15/17 10:29 AM  Result Value Ref Range Status   Specimen Description URINE, CATHETERIZED  Final   Special Requests Normal  Final   Culture   Final    NO GROWTH Performed at [place] [address], [place], [place] [address]    Report Status 10/16/2017 FINAL  Final  Culture, respiratory (NON-Expectorated)     Status: None   Collection Time: 10/20/17  2:45 PM  Result Value Ref Range Status   Specimen Description TRACHEAL ASPIRATE  Final   Special Requests NONE  Final   Gram Stain   Final     MODERATE WBC PRESENT,BOTH PMN AND MONONUCLEAR RARE SQUAMOUS EPITHELIAL CELLS PRESENT NO ORGANISMS SEEN    Culture   Final    NO GROWTH 2 DAYS Performed at [facility], [place] [address], [place], [place] [address]    Report Status 10/22/2017 FINAL  Final  Culture, Urine     Status: None   Collection Time: 10/20/17  2:56 PM  Result Value Ref Range Status   Specimen Description URINE, CATHETERIZED  Final   Special Requests Normal  Final   Culture   Final    NO GROWTH Performed at [facility], [place] [address] [place], [place] [address]    Report Status 10/21/2017 FINAL  Final  C difficile quick scan w PCR reflex     Status: None   Collection Time: 10/20/17  2:56 PM  Result Value Ref Range Status   C Diff antigen NEGATIVE NEGATIVE Final   C Diff toxin NEGATIVE NEGATIVE Final   C Diff interpretation No C. difficile detected.  Final    Comment: Performed at [facility], [place] [address] [place], [place] [address]  Culture, blood (routine x 2)     Status: None   Collection Time: 10/20/17  3:18 PM  Result Value Ref Range Status   Specimen Description BLOOD RIGHT HAND  Final   Special Requests IN PEDIATRIC BOTTLE Blood Culture adequate volume  Final  Culture   Final    NO GROWTH 5 DAYS Performed at [facility], [place] [address], [place], [place] [address]    Report Status 10/25/2017 FINAL  Final  Culture, blood (routine x 2)     Status: None   Collection Time: 10/20/17  3:24 PM  Result Value Ref Range Status   Specimen Description BLOOD LEFT HAND  Final   Special Requests IN PEDIATRIC BOTTLE Blood Culture adequate volume  Final   Culture   Final    NO GROWTH 5 DAYS Performed at [facility], [place] [address] [place], [place] [address]    Report Status 10/25/2017 FINAL  Final  Culture, blood (routine x 2)     Status: Abnormal (Preliminary result)   Collection Time: 10/25/17 10:58 AM  Result Value Ref Range Status   Specimen Description BLOOD LEFT  HAND  Final   Special Requests IN PEDIATRIC BOTTLE Blood Culture adequate volume  Final   Culture  Setup Time   Final    GRAM POSITIVE COCCI GRAM POSITIVE RODS IN PEDIATRIC BOTTLE CRITICAL RESULT CALLED TO, READ BACK BY AND VERIFIED WITH: J LEDFORD PHARMD 10/26/17 0300 JDW    Culture (A)  Final    BACILLUS SPECIES Standardized susceptibility testing for this organism is not available. GRAM POSITIVE COCCI CULTURE REINCUBATED FOR BETTER GROWTH Performed at [facility], [place] [address] [place], [place] [address]    Report Status PENDING  Incomplete  Culture, blood (routine x 2)     Status: Abnormal (Preliminary result)   Collection Time: 10/25/17 11:12 AM  Result Value Ref Range Status   Specimen Description BLOOD RIGHT HAND  Final   Special Requests IN PEDIATRIC BOTTLE Blood Culture adequate volume  Final   Culture  Setup Time   Final    GRAM POSITIVE COCCI GRAM POSITIVE RODS IN PEDIATRIC BOTTLE CRITICAL RESULT CALLED TO, READ BACK BY AND VERIFIED WITH: J LEDFORD PHARMD 10/26/17 0300 JDW    Culture (A)  Final    BACILLUS SPECIES Standardized susceptibility testing for this organism is not available. GRAM POSITIVE COCCI IDENTIFICATION TO FOLLOW Performed at [facility], [place] [address] [place], [place] [address]    Report Status PENDING  Incomplete  Blood Culture ID Panel (Reflexed)     Status: Abnormal   Collection Time: 10/25/17 11:12 AM  Result Value Ref Range Status   Enterococcus species NOT DETECTED NOT DETECTED Final   Listeria monocytogenes NOT DETECTED NOT DETECTED Final   Staphylococcus species DETECTED (A) NOT DETECTED Final    Comment: CRITICAL RESULT CALLED TO, READ BACK BY AND VERIFIED WITH:  J LEDFORD PHARMD 0300 10/26/17 JDW    Staphylococcus aureus DETECTED (A) NOT DETECTED Final    Comment: Methicillin (oxacillin)-resistant Staphylococcus aureus (MRSA). MRSA is predictably resistant to beta-lactam antibiotics (except ceftaroline). Preferred  therapy is vancomycin unless clinically contraindicated. Patient requires contact precautions if  hospitalized. CRITICAL RESULT CALLED TO, READ BACK BY AND VERIFIED WITH: J LEDFORD PHARMD 10/26/17 0300 JDW    Methicillin resistance DETECTED (A) NOT DETECTED Final    Comment: CRITICAL RESULT CALLED TO, READ BACK BY AND VERIFIED WITH: J LEDFORD PHARMD 10/26/17 0300 JDW    Streptococcus species NOT DETECTED NOT DETECTED Final   Streptococcus agalactiae NOT DETECTED NOT DETECTED Final   Streptococcus pneumoniae NOT DETECTED NOT DETECTED Final   Streptococcus pyogenes NOT DETECTED NOT DETECTED Final   Acinetobacter baumannii NOT DETECTED NOT DETECTED Final   Enterobacteriaceae species NOT  DETECTED NOT DETECTED Final   Enterobacter cloacae complex NOT DETECTED NOT DETECTED Final   Escherichia coli NOT DETECTED NOT DETECTED Final   Klebsiella oxytoca NOT DETECTED NOT DETECTED Final   Klebsiella pneumoniae NOT DETECTED NOT DETECTED Final   Proteus species NOT DETECTED NOT DETECTED Final   Serratia marcescens NOT DETECTED NOT DETECTED Final   Haemophilus influenzae NOT DETECTED NOT DETECTED Final   Neisseria meningitidis NOT DETECTED NOT DETECTED Final   Pseudomonas aeruginosa NOT DETECTED NOT DETECTED Final   Candida albicans NOT DETECTED NOT DETECTED Final   Candida glabrata NOT DETECTED NOT DETECTED Final   Candida krusei NOT DETECTED NOT DETECTED Final   Candida parapsilosis NOT DETECTED NOT DETECTED Final   Candida tropicalis NOT DETECTED NOT DETECTED Final    Comment: Performed at [facility], [place] [address] [place], [place] [address]    Studies/Results: No results found.    Assessment/Plan:  INTERVAL HISTORY: Bacillus cereus is actually growing phenotypically from his blood cultures and both of them.  There is a second organism which could be either a coagulase-negative staph or the methicillin-resistant staph aureus that was identified by PCR from [facility]   Principal  Problem:   FUO (fever of unknown origin) Active Problems:   Atrial fibrillation with RVR (HCC)   Acute on chronic systolic heart failure (HCC)   Encounter for central line care   Shock circulatory (HCC)   Malnutrition of moderate degree   Cardiogenic shock (HCC)   LVAD (left ventricular assist device) present ([facility])   HCAP (healthcare-associated pneumonia)   Pressure injury of skin   AKI (acute kidney injury) ([facility])   Central line infiltration (HCC)   CHF (congestive heart failure) ([facility])   Community acquired pneumonia of right lower lobe of lung ([facility])   Hemothorax on right   Hx of chest tube placement   Pneumococcal bacteremia   Focal seizure (HCC)   Unresponsiveness   Seizure ([place])   Acute bacterial endocarditis   Empyema ([place])   Acute respiratory failure ([place])   Palliative care encounter   Goals of care, counseling/discussion    VICTORHUGO PREIS is a 67 y.o. 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   In addition to his extensive hemothorax he was found to have a loculated pleural effusion and a necrotic pneumonia on imaging.  He has been clearly not a candidate for decortication and surgery.  I had him on Teflaro to cover both the pneumococcus we knew about and to cover MRSA.  Ultimately when the patient continued to have fevers the family had asked for second opinion.  My partner Dr. Megan Salon saw the patient and suggested a holiday from the beta-lactam in case it was causing the fever.  Family agreed to this and fevers did persist now the patient has been found to have  methicillin resistant Staphylococcus aureus by BCID, BUT NOW B CEREUS IN 2/2 blood cultures and 2nd organisms growing in 1/2 cultures  I have added clindamycin to cover the B cereus (this is likely a line related pathogen though he  could also have ENDOCARDITIS  I continue the Teflaro as well in case he has MRSA phenotypically or we want to treat the B CID result which identified the DNA of this organism.   I had another exhaustive conversation with the patient's decision makers namely his wife  and his son.  Part of this discussion was with myself and his nurse and his wife and son part of it was with all of [place] and also with Dr. Vaughan Browner who continued to have further conversations.   I was clear with the family that [place] single MD who has been seeing him including will physicians from our group and infectious disease multiple incisions from critical care medicine multiple physicians from cardiology, and multiple physicians from nephrology and neurology had all been in agreement that the patient was not going to survive his hospitalization and that we were practicing futile care.  I emphasized again that in order to survive his hospitalization this patient needs things which he cannot have performed due to his current state.  He would need cardiothoracic surgery with likely a partial lobectomy and cleanup of the pleural space.  This is not possible for him as he would die in the operating room.  We discussed the idea of changing lines but I think this is also an exercise clearly and futility as it will make no difference in the big picture.  I am concerned as is all of the CARE team members that this patient is suffering unnecessarily.  I was again very direct with the son and the wife and telling him that we as physicians do not like to harm the patient's and that we are concerned that we are causing harm to this patient in continuing to try to keep him alive and that we feel very strongly that we should move towards efforts to make him comfortable as it seems to [place] very clear that he is suffering without any possibility of survival.  The son emphasized that he has been told that there was a slim chance of survival but I was  emphatic that there was in my opinion no chance of survival.  I will leave the current antibiotics as is.   I spent greater than 60  minutes with the patient including in face to face counsel of the patient's deicsion makers, son and wife and in coordination of  His care with Dr. Vaughan Browner and Dr. Haroldine Laws.  The son and the wife are continue to have conversations with critical care and I am becoming slowly more optimistic that they may consider movement towards palliative care though this is something that is very difficult for them to come to terms with.  I will take the patient off of the infectious disease rounding list.  Dr. Baxter Flattery however will be available for questions should they come up   LOS: 27 days   Alcide Evener 10/27/2017, 1:12 PM

## 2017-10-27 NOTE — Progress Notes (Signed)
  Asked to see patient due to worsening respiratory status.  Patient awake this am.  Now obtunded with periods of apnea and Cheyne Stokes breathing. Gasping for breath at times. Precedex turned off earlier.   Remains on NRB with sats 89-93%. On NE at 17 and milrinone 0.5.   Multiple discussions with wife and son over the course of nearly an hour about futility of situation and further aggressive care.   I have suggested withdrawal of inotropes and initiation of ativan and morphine dripps for comfort.   They have vacillated in their wishes.   I have told them we will start drips when they are ready but I suspect he will not make it through the night either way. I have suggested they help him avoid further suffering.  Additional 60 minutes CCT.  Arvilla Meres, MD  6:31 PM

## 2017-10-27 NOTE — Progress Notes (Signed)
Patient ID: Bryan Harmon, male   DOB: 07-Jul-1951, 67 y.o.   MRN: [id number]     Advanced Heart Failure Rounding Note  Primary Cardiologist: Aundra Dubin   Subjective:    Events:  1/21 admitted with cardiogenic shock and lactic acidosis 1/30  Impella 5.0 placed in the OR 1/31  Right Chest tube placed c/b bleeding from CT. Multiple transfusions given (13u total)  2/4 ID consulted for ongoing fever.  2/4 Neuro consulted for ?seizure. CT of head with no acute findings. EEG without seizure activity.  2/5 Echo for impella placement showed a small vegetation on a mitral valve chord.  2/5 CT of chest/abd/pelvis: Necrotizing PNA on right with complex pleural effusion.    2/6 Impella removed due to blockage => suspect small vegetation on MV chord was drawn into the Impella, stopping the device.  2/13 Abx stopped by ID 2/15 Failed DC-CV 2/15 Extubated made DNR. Seen by Palliative Care 2/16 Bcx + MRSA -> started Teflaro  Extubated 2/15 after failed DC-CV. Seen by Palliative Care 2/15 but family resistant to conversations.  Remains on amio 30 mg/hr, milrinone 0.5 mcg/kg/min, norepinephrine increased to 25.  Lasix gtt increased to 15 mg/hr yesterday. On Precedex. Coox inaccurate at 89%  Awake in bed. On NRB. Sats 93-94%. Awake and can follow commands. Very weak. Denies HA.   Bcx + for MRSA. ID has started Teflaro. Back today + for bacillus   Increased diuresis yesterday. Weight down 10 pounds. Creatinine stable at 3.6. BUN 105. Sodium up to 150. Remains in AF with RVR in 130-140s (failed DC-CV 2/15)    Studies:  Echo: Severe LV dilation, EF 15-20%, severely dilated RV with severely decreased RV systolic function, moderate-severe Bryan, moderate-severe TR, cannot rule out LV apical thrombus.  Repeat echo with Definity showed definite LV apical thrombus.   TEE (1/28): EF 10-15%, diffuse hypokinesis, small LV apical thrombus, mildly dilated RV with moderately decreased systolic function, no LAA  thrombus.    Objective:   Weight Range: 98 kg (216 lb) Body mass index is 30.99 kg/m.   Vital Signs:   Temp:  [100.9 F (38.3 C)-102.9 F (39.4 C)] 100.9 F (38.3 C) (02/17 0600) Pulse Rate:  [25-153] 134 (02/17 0600) Resp:  [9-25] 15 (02/17 0600) BP: (74-114)/(48-96) 100/67 (02/17 0600) SpO2:  [88 %-95 %] 93 % (02/17 0600) Arterial Line BP: (88-122)/(53-79) 110/72 (02/17 0600) FiO2 (%):  [50 %] 50 % (02/16 1600) Weight:  [98 kg (216 lb)-102.5 kg (226 lb)] 98 kg (216 lb) (02/17 0353) Last BM Date: 10/24/17  Weight change: Filed Weights   10/25/17 0400 10/26/17 2000 10/27/17 0353  Weight: 106.1 kg (234 lb) 102.5 kg (226 lb) 98 kg (216 lb)    Intake/Output:   Intake/Output Summary (Last 24 hours) at 10/27/2017 0947 Last data filed at 10/27/2017 0600 Gross per 24 hour  Intake 4224.38 ml  Output 3115 ml  Net 1109.38 ml      Physical Exam   CVP 13  General:  Lying in bed on NRB. Critically ill appearing. Awake. Will follow coomands HEENT: temporal wasting + NGT and NRB Neck: supple.JVP to jaw Carotids 2+ bilat; no bruits. No lymphadenopathy or thryomegaly appreciated. Cor: PMI nondisplaced. Irregular tachy Lungs: coarse throughout. Dull at R base Abdomen: soft, nontender, nondistended. No hepatosplenomegaly. No bruits or masses. Good bowel sounds. Extremities: no cyanosis, clubbing, rash, 3-4+ edema  +scrotal edema  Neuro: awake. Follows commands by closing eyes and squeezing hands   Telemetry   A fib  130-140s, Personally reviewed   EKG    No new tracings.    Labs    CBC Recent Labs    10/26/17 0359 10/26/17 1817 10/27/17 0306  WBC 10.1  --  11.6*  NEUTROABS 9.0*  --  10.5*  HGB 9.4* 9.5* 9.4*  HCT 30.4* 28.0* 31.1*  MCV 98.4  --  99.4  PLT 409*  --  456*   Basic Metabolic Panel Recent Labs    10/26/17 0359 10/26/17 1817 10/27/17 0306 10/27/17 0723  NA 147* 150*  --  150*  K 3.6 3.4*  --  3.5  CL 113* 112*  --  113*  CO2 22  --   --  23    GLUCOSE 179* 143*  --  109*  BUN 104* 91*  --  105*  CREATININE 3.68* 3.50*  --  3.65*  CALCIUM 7.7*  --   --  7.6*  MG 2.1  --  2.1  --    Liver Function Tests No results for input(s): AST, ALT, ALKPHOS, BILITOT, PROT, ALBUMIN in the last 72 hours. No results for input(s): LIPASE, AMYLASE in the last 72 hours. Cardiac Enzymes No results for input(s): CKTOTAL, CKMB, CKMBINDEX, TROPONINI in the last 72 hours.  BNP: BNP (last 3 results) Recent Labs    12/13/16 0949 02/11/17 1049 09/14/2017 1218  BNP 369.2* 562.7* 2,485.9*    ProBNP (last 3 results) No results for input(s): PROBNP in the last 8760 hours.   D-Dimer No results for input(s): DDIMER in the last 72 hours. Hemoglobin A1C No results for input(s): HGBA1C in the last 72 hours. Fasting Lipid Panel No results for input(s): CHOL, HDL, LDLCALC, TRIG, CHOLHDL, LDLDIRECT in the last 72 hours. Thyroid Function Tests No results for input(s): TSH, T4TOTAL, T3FREE, THYROIDAB in the last 72 hours.  Invalid input(s): FREET3  Other results:   Imaging    No results found.   Medications:     Scheduled Medications: . chlorhexidine gluconate (MEDLINE KIT)  15 mL Mouth Rinse BID  . Chlorhexidine Gluconate Cloth  6 each Topical Daily  . famotidine  20 mg Intravenous Q24H  . feeding supplement (PRO-STAT SUGAR FREE 64)  30 mL Per Tube BID  . free water  200 mL Per Tube Q6H  . Gerhardt's butt cream   Topical BID  . glycopyrrolate  0.2 mg Intravenous Q4H  . insulin aspart  2-6 Units Subcutaneous Q4H  . insulin glargine  12 Units Subcutaneous BID  . mouth rinse  15 mL Mouth Rinse 10 times per day  . sennosides  5 mL Oral Daily  . sodium chloride flush  3 mL Intravenous Q12H  . sodium chloride flush  3 mL Intravenous Q12H    Infusions: . sodium chloride 250 mL (10/25/17 1132)  . sodium chloride    . amiodarone 30 mg/hr (10/27/17 0640)  . ceFTAROline (TEFLARO) IV Stopped (10/27/17 0024)  . clindamycin (CLEOCIN) IV     . dexmedetomidine (PRECEDEX) IV infusion for high rates 1.2 mcg/kg/hr (10/27/17 0853)  . feeding supplement (VITAL 1.5 CAL) 1,000 mL (10/27/17 0600)  . furosemide (LASIX) infusion 15 mg/hr (10/27/17 0600)  . heparin 1,750 Units/hr (10/27/17 0600)  . milrinone 0.5 mcg/kg/min (10/27/17 0600)  . norepinephrine (LEVOPHED) Adult infusion 25 mcg/min (10/27/17 0659)    PRN Medications: sodium chloride, acetaminophen (TYLENOL) oral liquid 160 mg/5 mL, albuterol, HYDROmorphone (DILAUDID) injection, magic mouthwash, midazolam, ondansetron (ZOFRAN) IV, sodium chloride flush, sodium chloride flush, sodium chloride flush    Patient Profile  Bryan Harmon is a 67 year old with a history of PAF S/P DC-CV 11/2016, s/p bilateral inguinal hernia repair 11/2017, PVCs, HTN, NICM, chronic systolic heart failure.   Sent from Urgent Care with A fib RVR. Acutely SOB on arrival   Assessment/Plan   1. Acute hypoxemic respiratory failure: RLL PNA, Strep pneumoniae in blood cultures. Respiratory cultures with parainfluenzae virus. Also acute/chronic systolic CHF.Now intubated s/p Impella 5.0 placement, multifocal PNA on CXR and CT chest. Remains critically ill.  Antibiotics broadened from ceftriaxone to vancomycin/meropenem on 1/30. BAL with Candida albicans, anidulafungin started 2/2.  Necrotizing PNA with complex pleural effusion on right on 2/5 CT, anti-microbials changed to ceftaroline and Flagyl on 2/5.  ID concerned about possible drug fever, stopped antibiotics on 2/13. Bcx 2/15 now 2/2 MRSA presumes from lung - Right chest tube placed 1/31, still with serosanguineous drainage. - Suspect that necrosis in right lung + loculated effusion would require surgery to quell infection.  However, he is too unstable for this.  No change.  - One-way extubation performed 2/15. Respiratory status stable this am with adequate oxygenation remains on NRB today. D/w CCM at bedside 2.Acute on chronic systolic CHF->  cardiogenic shock : Nonischemic cardiomyopathy. 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 Etiology of his CHF is not clear =>no definite inciting event. He has a history of HTN but doubt this was the only trigger. 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 This would ideally be confirmed by cMRI, but he has not wanted an MRI (concerned about side effects). He also has frequent PVCs, 21% total on last holter in 4/18 which is a risk for fall in EF.No family history of CMP. Cannot rule out viral myocarditis. SPEP negative. With medical management, he initially felt a lot better. However, he quit all his meds in early 2018 with recurrence of NYHA III symptoms and onset of atrial fibrillation.He had TEE-DCCV in 3/18.TEE showed that EF remained25%. He quit his meds again in 4/18 and apparently did not restart them when I asked him to in 6/18. No meds probably since 4/18. Echo this admission with EF 15-20%, severe RV dysfunction, LV apical thrombus. RV is hypokinetic.  Attempted DCCV on 1/28 failed likely due to pressors/inotropes.  [place] 1/29 with low cardiac output => Impella 5.0 placed 1/30. On 2/6, Impella removed due to pump stop (suspect vegetation from MV chord sucked into Impella). Suspect combination of septic/vasogenic shock and cardiogenic shock.  Luiz Blare out on 2/10.   Coox inaccurate this am on milrinone 0.5 mcg/kg/min, norepinephrine up to 25 - Creatinine stable 3.6-3.7. CVP10-12 Diffuse 4+ edema with 3rd spacing.  Good diuresis overnight on Lasix gtt at 15 mg/hr. Hypernatremia worsening. May need to back off.  - Continue current milrinone and norepinephrine as family not wanting to de-escalate. Confirmed today.  - End point still difficult to envision => would not be LVAD candidate with renal failure and RV failure.  Family met with Palliative care 2/15 and we resistant to further changes 3. Atrial  fibrillation:Persistent, now with RVR. HR remains 130-140son amiodarone gtt at30. Not sure how long he has been in atrial fibrillation with RVR, but this may have triggered his CHF and AKI due to worsening of cardiac output.  - Failed TEE-guided DCCV on 1/28 and 2/15 in setting of vasoactive meds. Remains on IV amio and heparin with rates 130-140 - Family initially said they wanted comfort care if DC-CV failed. He has now been extubated  and respiratory status stable. Family not wanting to de-escalate care at this time 4. AKI: Creatinine 1.08 in 6/18, he has not been on any meds. Suspect cardiorenal syndrome with afib/RVR and fall in cardiac output.He was initially hyperkalemic and acidotic.BUN/creatinine remain 3.6=3.7 but BUN risingdespite hemodynamic support  - Continue to support CO with inotropes - Continue Lasix gtt with elevated CVP and severe edema. Weight up 50 pounds from baseline. No change.  - Worsening renal function despite support is concerning, he is not HD candidate.  5. TM:HDQQI pneumo PNA as well as parainfluenza virus.CT chest 1/29 with multifocal PNA. On  1/30 to vancomycin/meropenem, then anidulafungin added.  ID has seen. Now on Teflaro and metronidazole. CT of chest/abd/pelvis 2/5 concerning for necrotizing right pneumonia with surrounding complex large pleural effusion.  He has a chest tube with ongoing serosanguineous drainage. 2/5 showed a small vegetation on a mitral valve chord.  Repeat ECHO on 2/6 did not show vegetation. Assumed it was pulled into Impella.  He is now off [employer].   - ID saw 10/23/17, held abx for ?drug fever.  Tmax 102.  - Bcx from 2/15 2/2 MRSA - started on Teflaro - Bck today also + bacillus - clinda added. Spoke with ID at bedside - Necrosis in right lung + loculated effusion would ideally require surgery to effectively treat infection, however do not think he would survive surgery at this point.    6. Elevated LFTs: Suspect shock liver type  picture. - AST/ALT > 1000 at admission but have trended down steadily. No change 7. LV thrombus: Noted on TTE and TEE, small.  - Remains on heparin.   - No LV thrombus noted on limited echo 10/24/17. 8. Hypernatremia: Worse today at 150 continue free H2O flushes 9. Malnutrition: Continue TFs 10. Anemia: Significant blood loss via chest tube and mouth.   Received 2UPRBCs 2/3, 2/4 5 uPRBCs, 2/5 2UPRBCs. 2/6 2U PRBCs. 1 unit PRBCs 2/9, 1 unit PRBCs 2/13.  Hgb stable today.  11. Suspected Deep Tissue Injury: Sacrum/R buttock/Mid back . Continue to repostion R/L. WOC consult  Appreciated. 12. Seizure: Neurology appreciated. CT head negative. EEG no seizure noted. No change.    Failed DC-CV 2/15 and underwent one-way extubation. Remains critically ill but BP and respiratory status stable today on current support though requiring higher doses of NE. Bcx now with MRS and bacillus. ID has added Teflaro and clinda. Re-iterated graveness of situation to family and my firm belief that he will almost certainly never be able to walk out of this hospital. They have seen Palliative Care and remain uninterested in de-escalating care any further at this point.   CRITICAL CARE Performed by: Glori Bickers  Total critical care time: 45 minutes  Critical care time was exclusive of separately billable procedures and treating other patients.  Critical care was necessary to treat or prevent imminent or life-threatening deterioration.  Critical care was time spent personally by me (independent of midlevel providers or residents) on the following activities: development of treatment plan with patient and/or surrogate as well as nursing, discussions with consultants, evaluation of patient's response to treatment, examination of patient, obtaining history from patient or surrogate, ordering and performing treatments and interventions, ordering and review of laboratory studies, ordering and review of radiographic  studies, pulse oximetry and re-evaluation of patient's condition.   Glori Bickers, MD  10/27/2017 9:47 AM  Advanced Heart Failure Team Pager [phone] (M-F; 7a - 4p)  Please contact [facility] for night-coverage after hours (  4p -7a ) and weekends on [email or web address]

## 2017-10-27 NOTE — Progress Notes (Signed)
PULMONARY / CRITICAL CARE MEDICINE   Name: Bryan Harmon MRN: [id number] DOB: 1951-03-04    ADMISSION DATE:  10/05/2017  REFERRING MD:  Nils Pyle  CHIEF COMPLAINT:  Fatigue  HISTORY OF PRESENT ILLNESS:   67 y/o male with non-ischemic cardiomyopathy and afib admitted with decompensated heart failure.  Required Impella, vent.    Lines/ Tubes 1/30 L IJ CVL > 1/31 1/30 R IJ swan > 1/30 R sub clav impella > 10/16/2017 1/30 L radial arterial line > 10/15/2017 1/30 ETT >  1/31 L [place] TLC >  10/15/2017 right femoral arterial line placed>>  Cultures: 1/21 Blood >> Strep Pneumoniae >> pan sens 1/21 Resp Viral Panel >> + Parainfluenza Virus 3 1/28 blood > ng 1/30 blood > ng 1/31 resp culture > ng 2/1 BAL > candida 10/15/2017 blood cultures x2>> 10/15/2017 tracheal aspirate>> rare gram-negative rods few Candida albicans 10/15/2017 urine culture>> negative Bcx 10/25/17- MRSA, bacillus  ABX: Infectious disease consult 10/14/2017 Rocephin 1/21 >> 1/29 Doxy 1/21 >> 1/23 Vanc 1/30 >> 10/14/2017 Mero 1/30 >> 10/15/2017 Eraxis 2/2 >> off Teflaro 10/15/2017>>2/14, 2/16 Flagyl 10/15/2017>>2/14 Clinidamycin 2/17>  Studies: 10/01/2017>> Echo EF 15-20%, Apex ? PAP muscle vs. Thrombus, LV severely dilated, moderate concentric hypertrophy, systolic function normal, unable to evaluate LV diastolic function due to atrial fibrillation. + moderate spontaneous echo contrast, indicative of stasis Impression 1/23 Echo > Severely dilated LV with severe LV dysfunction globally with EF 15-20%. Severely dialted RV with severe RV dysfunction. Moderate to severe MR with ERO 0.33cm2 and MR volume 19m. Moderate tosevere TR with moderate pulmonary HTN. Moderately thickened and calcified AV leaflets with mild MR, mildly dilated aortic root, massive biatrial enlargement. Cannot rule out LV thrombus. Thereis significant spontaneous echo contrast in LV c/w sluggish blood flow. The right ventricular systolic pressure was  increasedconsistent with moderate pulmonary hypertension. 1/29 CT chest > dense bilateral lower lobe consolidation R>L 1/30 TEE >> LVEF 20-25%, impella in place, poor overall contractility but LV improved 10/25/2017 bedside cardioversion attempted without success.   SUBJECTIVE:   Blood cultures now growing bacillus.  Started on clindamycin in addition to Teflaro by infectious disease  VITAL SIGNS: BP 95/77   Pulse (!) 140   Temp (!) 101.5 F (38.6 C)   Resp (!) 22   Ht _0  (1.778 m)   Wt 216 lb (98 kg)   SpO2 93%   BMI 30.99 kg/m   HEMODYNAMICS: CVP:  [8 mmHg-16 mmHg] 8 mmHg  VENTILATOR SETTINGS: FiO2 (%):  [50 %] 50 %  INTAKE / OUTPUT: I/O last 3 completed shifts: In: 6134.2 [I.V.:3044.2; NG/GT:2490; IV Piggyback:600] Out: 55631[Urine:5000; Chest Tube:550]  PHYSICAL EXAMINATION: Gen:      Increased work of breathing, agonal HEENT:  EOMI, sclera anicteric Neck:     No masses; no thyromegaly Lungs:    Clear to auscultation bilaterally; normal respiratory effort CV:         Regular rate and rhythm; no murmurs Abd:      + bowel sounds; soft, non-tender; no palpable masses, no distension Ext:    Anasarca; adequate peripheral perfusion Skin:      Warm and dry; no rash Neuro: Unresponsive  LABS:  BMET Recent Labs  Lab 10/25/17 0312 10/26/17 0359 10/26/17 1817 10/27/17 0723  NA 150* 147* 150* 150*  K 3.6 3.6 3.4* 3.5  CL 115* 113* 112* 113*  CO2 22 22  --  23  BUN 109* 104* 91* 105*  CREATININE 3.65* 3.68* 3.50* 3.65*  GLUCOSE  94 179* 143* 109*   Electrolytes Recent Labs  Lab 10/21/17 0354  10/23/17 0435 10/24/17 0421 10/25/17 0312 10/26/17 0359 10/27/17 0306 10/27/17 0723  CALCIUM 7.5*   < > 7.3* 7.4* 7.7* 7.7*  --  7.6*  MG 2.2   < > 2.2 2.2 2.1 2.1 2.1  --   PHOS 5.2*  --  4.3 4.9*  --   --   --   --    < > = values in this interval not displayed.   CBC Recent Labs  Lab 10/25/17 0312 10/26/17 0359 10/26/17 1817 10/27/17 0306  WBC 9.6  10.1  --  11.6*  HGB 9.1* 9.4* 9.5* 9.4*  HCT 29.7* 30.4* 28.0* 31.1*  PLT 422* 409*  --  405*   Coag's No results for input(s): APTT, INR in the last 168 hours. Sepsis Markers No results for input(s): LATICACIDVEN, PROCALCITON, O2SATVEN in the last 168 hours.  ABG Recent Labs  Lab 10/23/17 0419 10/24/17 0104 10/24/17 0456  PHART 7.419 7.274* 7.418  PCO2ART 29.8* 48.9* 34.3  PO2ART 81.0* 89.0 84.0   Liver Enzymes Recent Labs  Lab 10/23/17 0913  AST 35  ALT 32  ALKPHOS 85  BILITOT 1.0  ALBUMIN 1.5*   Cardiac Enzymes No results for input(s): TROPONINI, PROBNP in the last 168 hours.  Glucose Recent Labs  Lab 10/26/17 1653 10/26/17 2014 10/27/17 0036 10/27/17 0332 10/27/17 0829 10/27/17 1257  GLUCAP 153* 146* 126* 102* 121* 150*   Imaging No results found. DISCUSSION: 67 y/o male with ischemic cardiomyopathy admitted with decompensated systolic heart failure in the setting of strep pneumo pneumonia and bacteremia s/p impella.  He has acute on chronic kidney failure, cardiogenic shock.  10/15/2017 with fever 105.6, pancultured 01/2018, ID consult 10/14/2017. 's overall cardiac function has declined despite all interventions.  Family wants to continue with maximum interventions.  10/18/2016 CODE STATUS changed to a limited with no chest compressions but all other interventions will be continued.  Despite his poor prognosis he is continued to improve therefore possibility of tracheostomy may be undertaken within 72 hours. 10/19/2017 no significant change in status.  I do note there is some having been placed on pressure regulated volume control mode of ventilation which was returned to written orders for pressure control ventilation on 10/19/2017. His hemoglobin is drifting down he may be ready for follow-up transfusion 10/20/2017 continues to slowly improve.  Massive anasarca has improved despite being a positive I&O.  His Levophed is weaning.   10/25/2017 One way  extubation   ASSESSMENT / PLAN: 67 year old with ischemic cardiomyopathy, MR, paroxysmal atrial fibrillation admitted with fatigue, rapid atrial fibrillation, cardiogenic shock, Streptococcus bacteremia, presumed pneumonia, parainfluenza infection.  Prolonged course with Impala placement which had to be removed due to thrombosis, acute kidney failure  Respiratory failure Necrotizing pneumococcus pneumonia, parainfluenza infection Severe cardiogenic shock  S/p one-way extubation.  Code status is DNR. Family is not ready to withdraw Use dilaudid and Ativan as needed for comfort.  Wean off Precedex On Teflaro and clindamycin per ID  Family updated again at bedside and reiterated that he will not be able to survive this.  Marshell Garfinkel MD [facility] Pager [phone] If no answer or after 3pm call: [phone] 10/27/2017, 2:00 PM

## 2017-10-27 NOTE — Progress Notes (Signed)
18 Days Post-Op Procedure(s) (LRB): PLACEMENT OF IMPELLA 5.0 LEFT VENTRICULAR ASSIST DEVICE (N/A) TRANSESOPHAGEAL ECHOCARDIOGRAM (TEE) (N/A) Subjective: Patient extubated minimally responsive Surgical incision right anterior chest clean and dry Minimal chest tube drainage-serosanguineous Objective: Vital signs in last 24 hours: Temp:  [100.9 F (38.3 C)-102.9 F (39.4 C)] 100.9 F (38.3 C) (02/17 0600) Pulse Rate:  [25-153] 134 (02/17 0600) Cardiac Rhythm: Atrial fibrillation (02/17 0353) Resp:  [9-25] 15 (02/17 0600) BP: (74-114)/(48-96) 100/67 (02/17 0600) SpO2:  [88 %-95 %] 93 % (02/17 0600) Arterial Line BP: (90-122)/(56-79) 110/72 (02/17 0600) FiO2 (%):  [50 %] 50 % (02/16 1600) Weight:  [216 lb (98 kg)-226 lb (102.5 kg)] 216 lb (98 kg) (02/17 0353)  Hemodynamic parameters for last 24 hours: CVP:  [13 mmHg-16 mmHg] 14 mmHg  Intake/Output from previous day: 02/16 0701 - 02/17 0700 In: 4555.1 [I.V.:2145.1; NG/GT:1810; IV Piggyback:600] Out: 3490 [Urine:3090; Chest Tube:400] Intake/Output this shift: No intake/output data recorded.    Lab Results: Recent Labs    10/26/17 0359 10/26/17 1817 10/27/17 0306  WBC 10.1  --  11.6*  HGB 9.4* 9.5* 9.4*  HCT 30.4* 28.0* 31.1*  PLT 409*  --  405*   BMET:  Recent Labs    10/26/17 0359 10/26/17 1817 10/27/17 0723  NA 147* 150* 150*  K 3.6 3.4* 3.5  CL 113* 112* 113*  CO2 22  --  23  GLUCOSE 179* 143* 109*  BUN 104* 91* 105*  CREATININE 3.68* 3.50* 3.65*  CALCIUM 7.7*  --  7.6*    PT/INR: No results for input(s): LABPROT, INR in the last 72 hours. ABG    Component Value Date/Time   PHART 7.418 10/24/2017 0456   HCO3 21.9 10/24/2017 0456   TCO2 23 10/26/2017 1817   ACIDBASEDEF 2.0 10/24/2017 0456   O2SAT 89.7 10/27/2017 0330   CBG (last 3)  Recent Labs    10/27/17 0036 10/27/17 0332 10/27/17 0829  GLUCAP 126* 102* 121*    Assessment/Plan: S/P Procedure(s) (LRB): PLACEMENT OF IMPELLA 5.0 LEFT  VENTRICULAR ASSIST DEVICE (N/A) TRANSESOPHAGEAL ECHOCARDIOGRAM (TEE) (N/A) Leave chest tube to water -400 cc drainage yesterday   LOS: 27 days    Kathlee Nations Trigt III 10/27/2017

## 2017-10-27 NOTE — Progress Notes (Signed)
ANTICOAGULATION CONSULT NOTE - Follow Up Consult  Pharmacy Consult:  Heparin Indication: atrial fibrillation and LA thrombus  Allergies  Allergen Reactions  . Fentanyl And Related Other (See Comments)    Patients eyes roll in the back of his head and patient has seizure like activity    Patient Measurements: Height: 5\' 10"  (177.8 cm) Weight: 216 lb (98 kg) IBW/kg (Calculated) : 73  Heparin dosing weight = 84 kg  Vital Signs: Temp: 100.9 F (38.3 C) (02/17 0600) BP: 100/67 (02/17 0600) Pulse Rate: 134 (02/17 0600)  Labs: Recent Labs    10/25/17 0312 10/25/17 1315 10/26/17 0359 10/26/17 1817 10/27/17 0306  HGB 9.1*  --  9.4* 9.5* 9.4*  HCT 29.7*  --  30.4* 28.0* 31.1*  PLT 422*  --  409*  --  405*  HEPARINUNFRC 0.17* 0.23* 0.35  --  0.36  CREATININE 3.65*  --  3.68* 3.50*  --     Estimated Creatinine Clearance: 24.7 mL/min (A) (by C-G formula based on SCr of 3.5 mg/dL (H)).   Medications: Heparin @ 1750 units/hr  Assessment: 66 YOM known to pharmacy for heparin management with impella. Impella removed 10/16/17 due to stoppage from small vegetation that got sucked into the catheter. Heparin resumed ~ 4 hours after removal for afib and LA thrombus.   Heparin level at goal 0.36. CBC stable. No overt bleeding or complications noted currently.   Goal of Therapy:  Heparin level 0.3 units/ml Monitor platelets by anticoagulation protocol: Yes   Plan:  1) Continue heparin at 1750 units/hr 2) Daily heparin level and CBC   Louie Casa, PharmD, BCPS 10/27/2017 7:51 AM

## 2017-10-29 ENCOUNTER — Encounter (HOSPITAL_COMMUNITY): Payer: Self-pay

## 2017-10-29 LAB — CULTURE, BLOOD (ROUTINE X 2)
SPECIAL REQUESTS: ADEQUATE
Special Requests: ADEQUATE

## 2017-11-08 NOTE — Death Summary Note (Signed)
  Advanced Heart Failure Death Summary  Death Summary   Patient ID: Bryan Harmon MRN: [id number], DOB/AGE: [birth year] 67 y.o. Admit date: 09/14/2017 D/C date:     10/14/2017   Primary Discharge Diagnoses:  1. Acute hypoxemic respiratory failure 2.Acute on chronic systolic CHF-> cardiogenic shock : Nonischemic cardiomyopathy 3. Atrial fibrillation:Persistent, now with RVR 4. ARF 5. Necrotizing PNA 6. MRSA bacteremia 7.Pleural Effusion, Right 8.Suspected Deep Tissue Injury 9. Malnutrition 10. Anemia 11. Seizure 12. Elevated LFTs 13. Hypernatremia 14. LV thrombus  Hospital Course:   Bryan Harmon was a 67 y.o. male with a history of PAF S/P DC-CV 11/2016, s/p bilateral inguinal hernia repair 11/2017, PVCs, HTN, NICM, chronic systolic heart failure. He presented to [facility] urgent care on 10/06/2017. Found to be in cardiogenic shock with lactic acidosis in the setting of sepsis and Afib RVR.   Pt had an incredibly complicated admission, and at multiple points his incredibly grim prognosis was discussed with his family. Multiple service lines and specialities were involved in his care.  For a more problem based history, please see Final Progress Note from Dr. Gala Romney on 10/27/2017.  Event based hospital course as below.   Significant Events 1/21 Pt admitted with cardiogenic shock and lactic acidosis in setting of Parainfluenzae virus, Strep pneumonia in blood cultures, and Afib RVR.  All this complicated by history of medical non-compliance for several months leading up to admission.  1/30  Impella 5.0 placed in the OR 1/31  Right Chest tube placed c/b bleeding from CT. Multiple transfusions given (13u total) 2/4 ID consulted for ongoing fever. ABX adjusted as necessary.  2/4 Neuro consulted for ?seizure. CT of head with no acute findings. EEG without seizure activity. 2/5 Echo for impella placement showed a small vegetation on a mitral valve chord.  2/5 CT of chest/abd/pelvis:  Necrotizing PNA on right with complex pleural effusion. ABX adjusted by ID.  2/6 Impella removed due to blockage => suspect small vegetation on MV chord was drawn into the Impella, stopping the device.  2/13 Abx stopped by ID with concerns for drug fever or potential masking of pathogen due to prolonged ABX. 2/15 Failed DC-CV 2/15 Extubated and made DNR. Seen by Palliative Care. Comfort medications discontinued.  2/16 Bcx + MRSA -> started Teflaro 2/17 Pt went into respiratory failure and family agreed for full comfort care.   Each day of this patients admission involved complicated decision making, and usually adjustments of medications including diuresis and pressor support.   As above, pt was terminally extubated 10/25/17 with no plans to re-intubate due to futility. At multiple times during this patients admission, once it was clear chance of recovery was nearly non-existent, palliative care was discussed with family. They were resolute on continuing aggressive care until evening of 10/27/17, when patient was in clear and obvious respiratory failure with active distress. Transition to comfort care was made, and pt quickly passed.   Ultimately patient passed away due to multiple system organ failure in the setting of End stage systolic heart failure and acute respiratory failure. This note is meant to summarize the patients admission and is not exhaustive. For more detailed view of patients problems please see the final progress notes from individual service lines.   Every effort was made during this admission to improve patients clinical picture.   Duration of Discharge Encounter: Greater than 35 minutes   Signed, Graciella Freer, PA-C 10/20/2017, 8:39 AM

## 2017-11-08 NOTE — Progress Notes (Signed)
Ativan IV gtts 60ml and Morphine 13ml wasted down sink with Sharol Harness RN.

## 2017-11-08 NOTE — Progress Notes (Signed)
Asystole noted on bedside ekg.  Myself and Sharol Harness RN confirmed no auscultation/palpation of heartbeat.  Family at bedside.  Dr. Gala Romney notified.  [facility] notified of pt's death at [phone].

## 2017-11-08 DEATH — deceased

## 2018-05-22 IMAGING — US US RENAL
1 series · 14 of 25 positions shown · non-contrast
Comparison: None.

CLINICAL DATA: Acute kidney injury

EXAM:
RENAL / URINARY TRACT ULTRASOUND COMPLETE

[Series 1: us renal · 0.23mm/px · 14 of 35 slices shown]
[im 1/35]
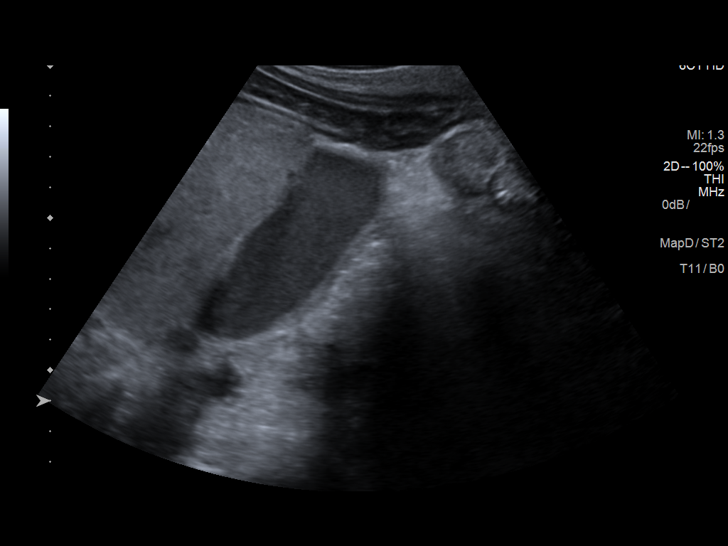
[im 3/35]
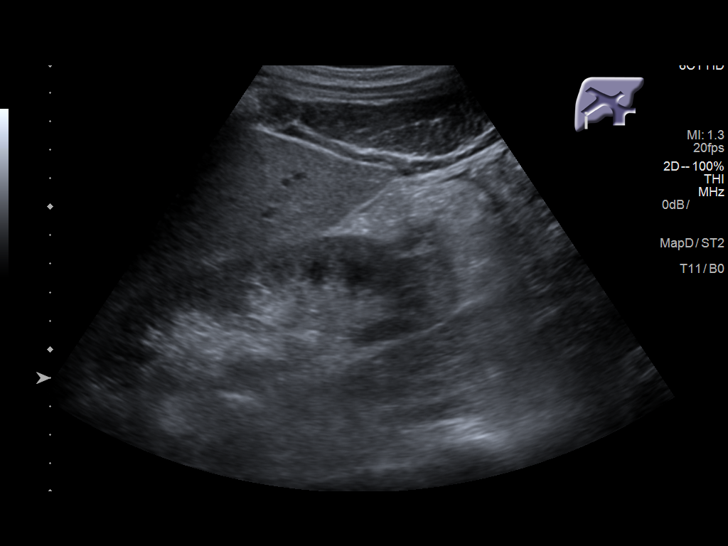
[im 6/35]
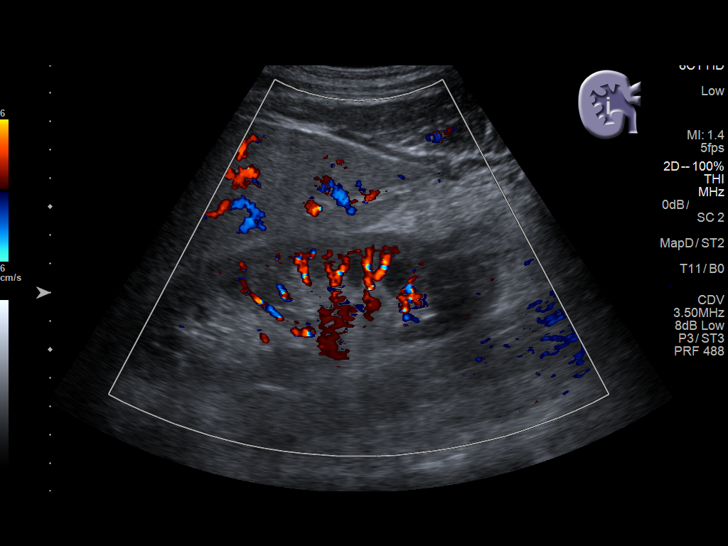
[im 9/35]
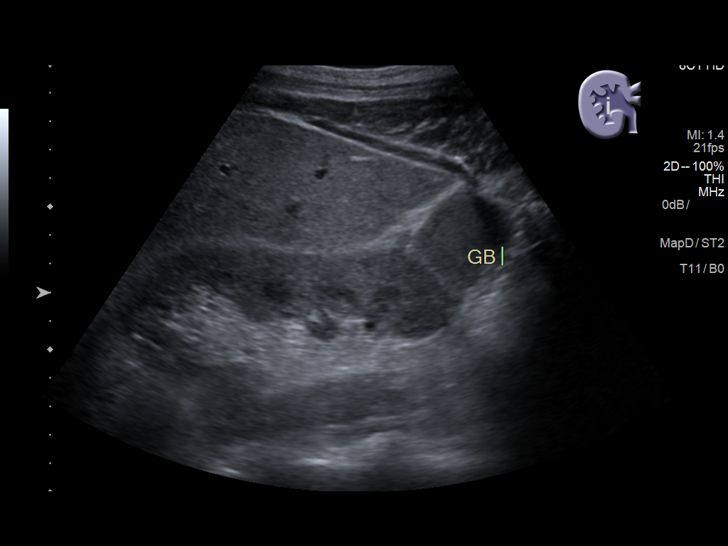
[im 12/35]
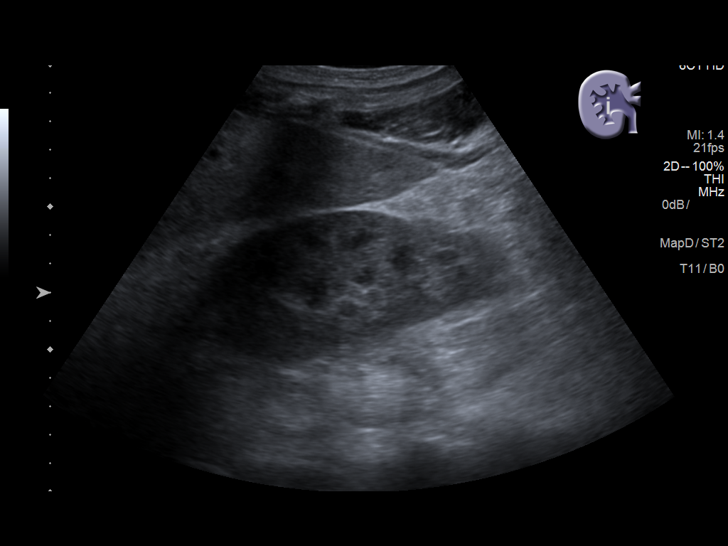
[im 13/35]
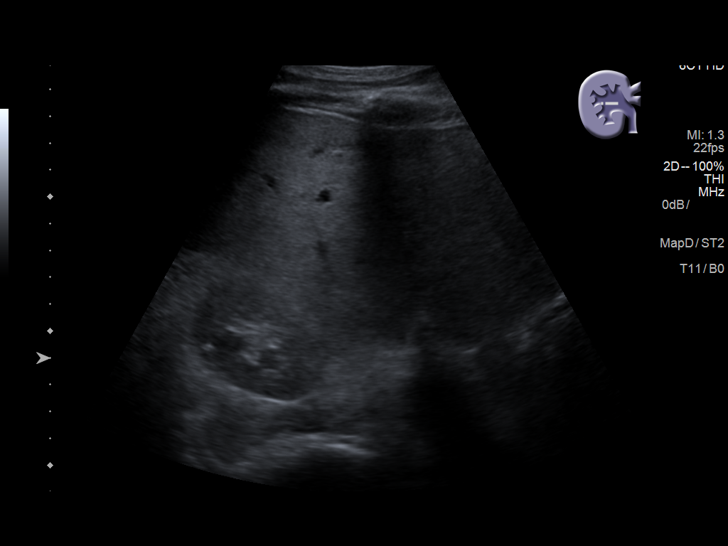
[im 16/35]
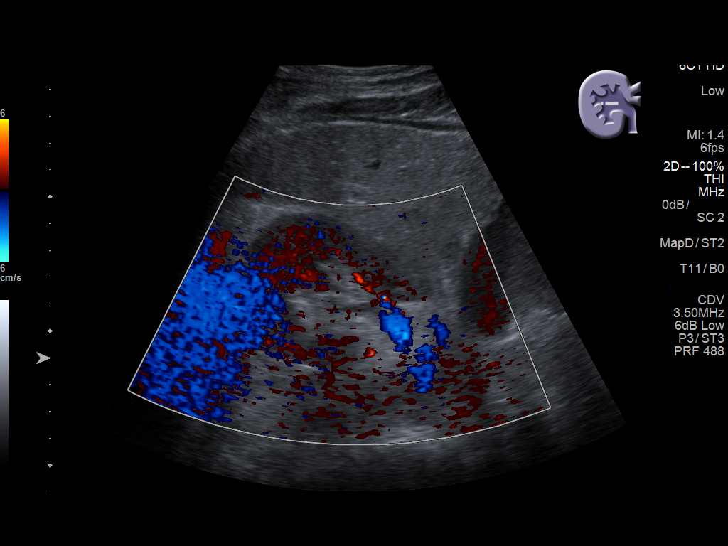
[im 19/35]
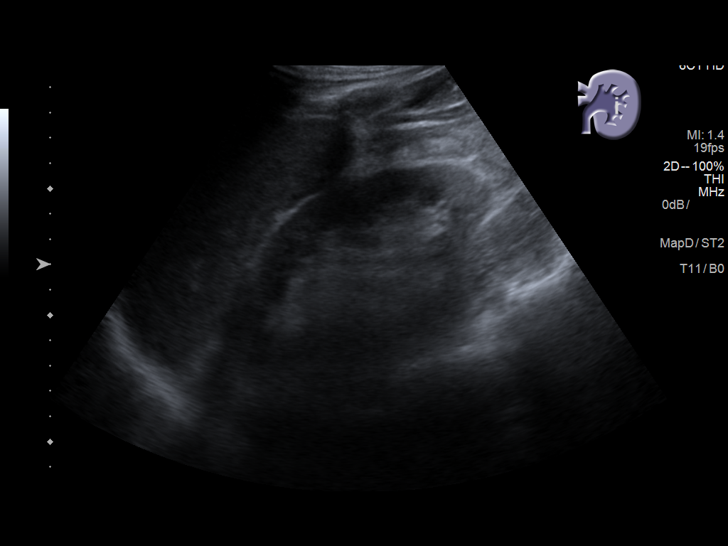
[im 22/35]
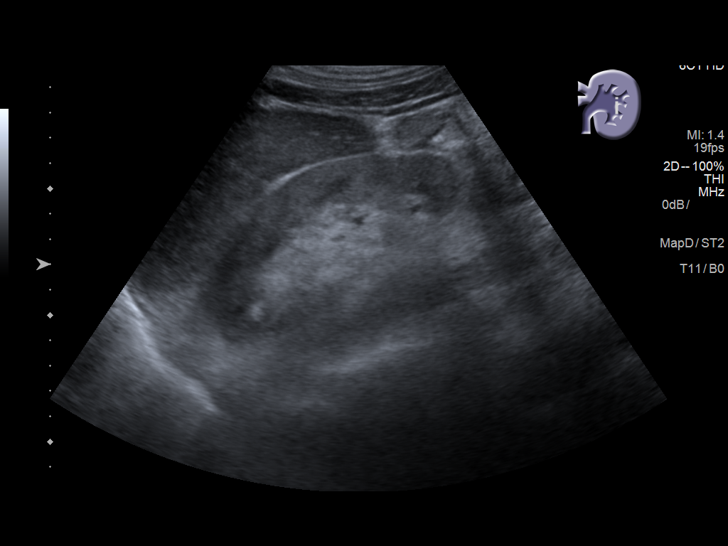
[im 23/35]
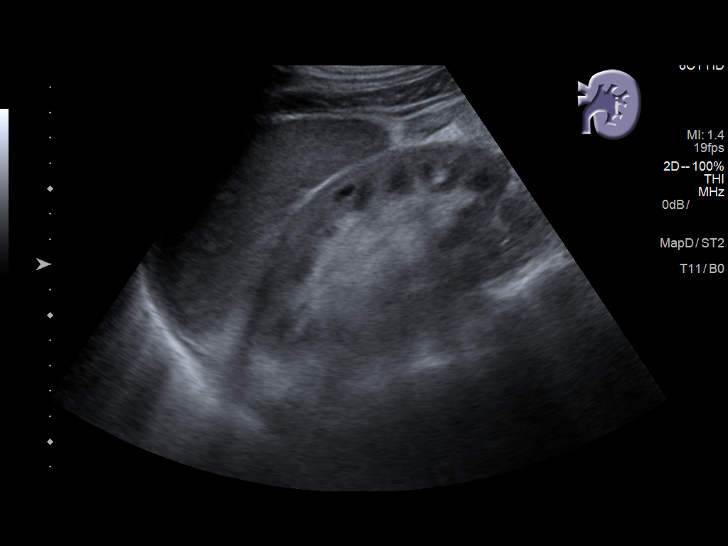
[im 26/35]
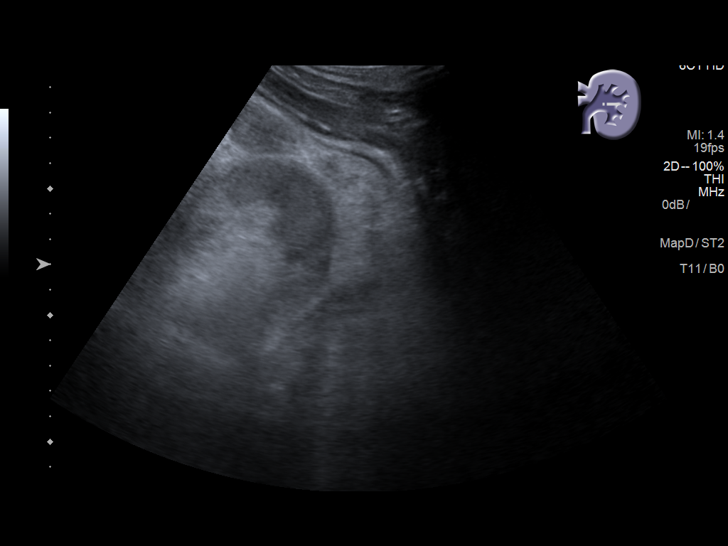
[im 29/35]
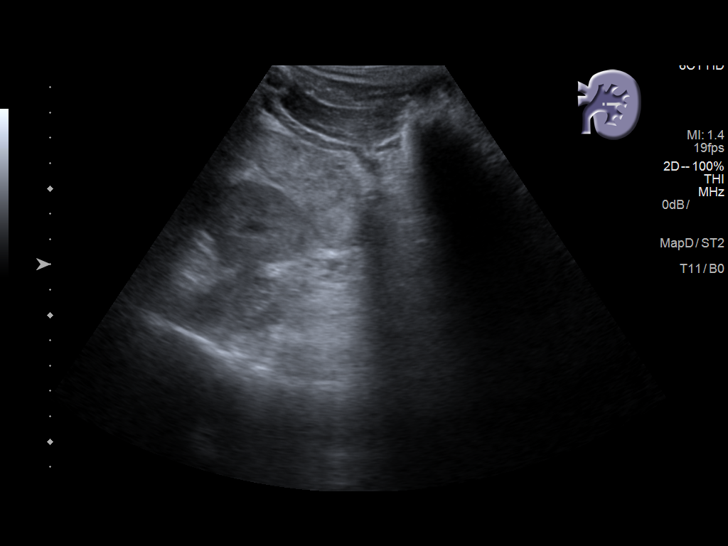
[im 32/35]
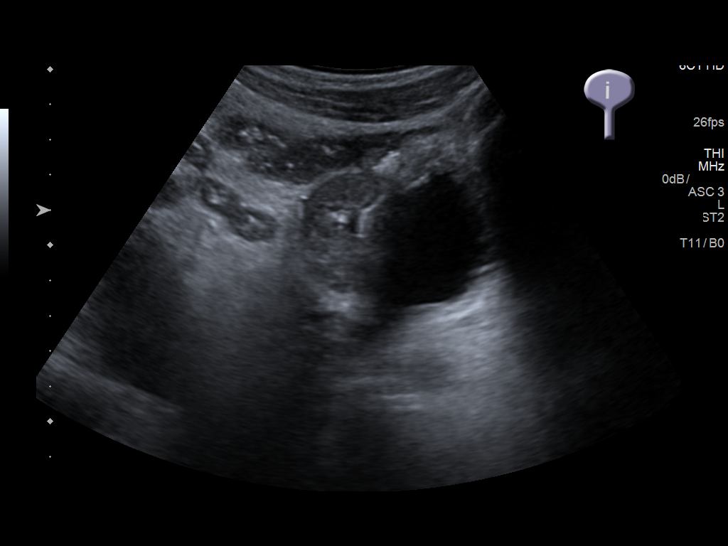
[im 35/35]
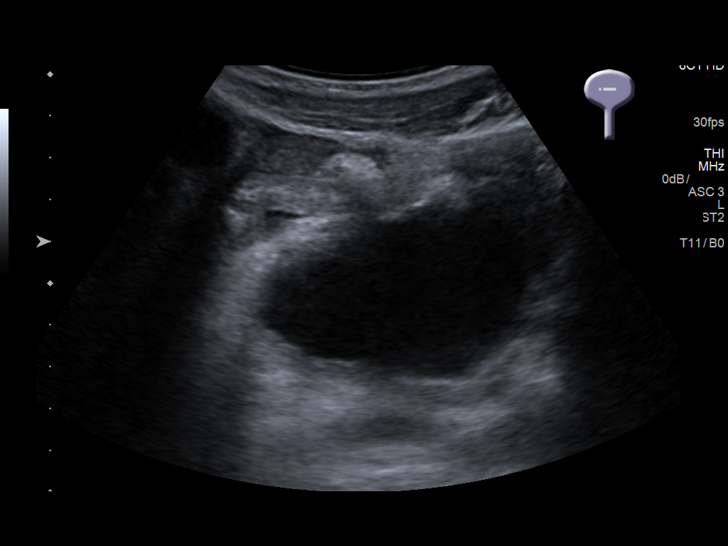

[14 of 25 positions shown; findings below may reference images not displayed]

FINDINGS: Right Kidney:

Length: 12.6 cm.  No hydronephrosis or mass.

Left Kidney:

Length: 12.1 cm. Slight increased cortical echogenicity. No
hydronephrosis or mass

Bladder:

Bladder is nearly empty

Incidental note made of sludge filled gallbladder
IMPRESSION: 1. Slight increased cortical echogenicity of the left kidney. No
hydronephrosis
2. Incidental note made of sludge filled gallbladder

## 2018-06-02 IMAGING — DX DG CHEST 1V PORT
1 series · 1 of 1 positions shown · non-contrast
Comparison: 10/01/2017

CLINICAL DATA: Followup respiratory failure

EXAM:
PORTABLE CHEST 1 VIEW

[chest ap]
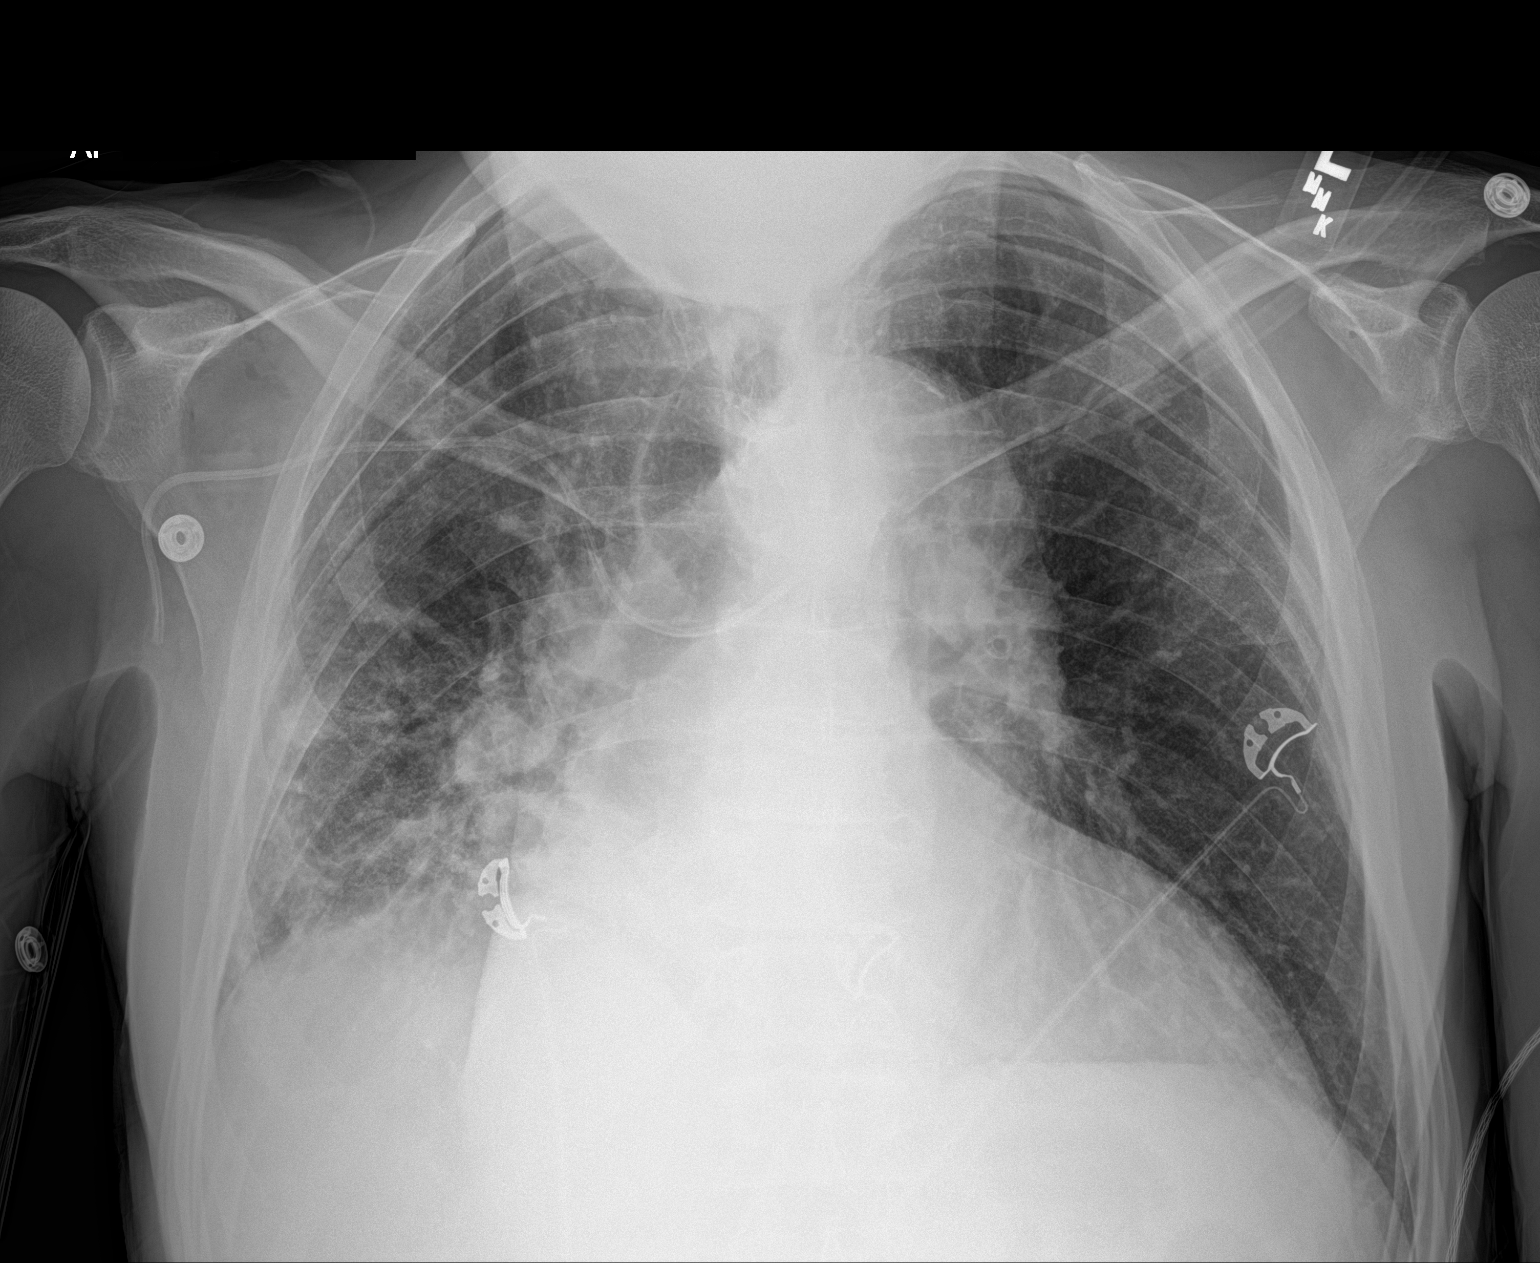

[1 of 1 positions shown; findings below may reference images not displayed]

FINDINGS: Stable appearance of right subclavian central venous catheter with
the tip in the left innominate vein. No pneumothorax is noted.
Patchy changes are again seen in the right lung base somewhat
accentuated by patient rotation. Cardiomegaly is again noted. The
left lung is clear.
IMPRESSION: Overall stable appearance of the chest when compared with the
previous day.

## 2018-06-07 IMAGING — DX DG CHEST 1V PORT
1 series · 1 of 1 positions shown · non-contrast
Comparison: 10/05/2017

CLINICAL DATA: Pneumonia, hypertension

EXAM:
PORTABLE CHEST 1 VIEW

[chest ap]
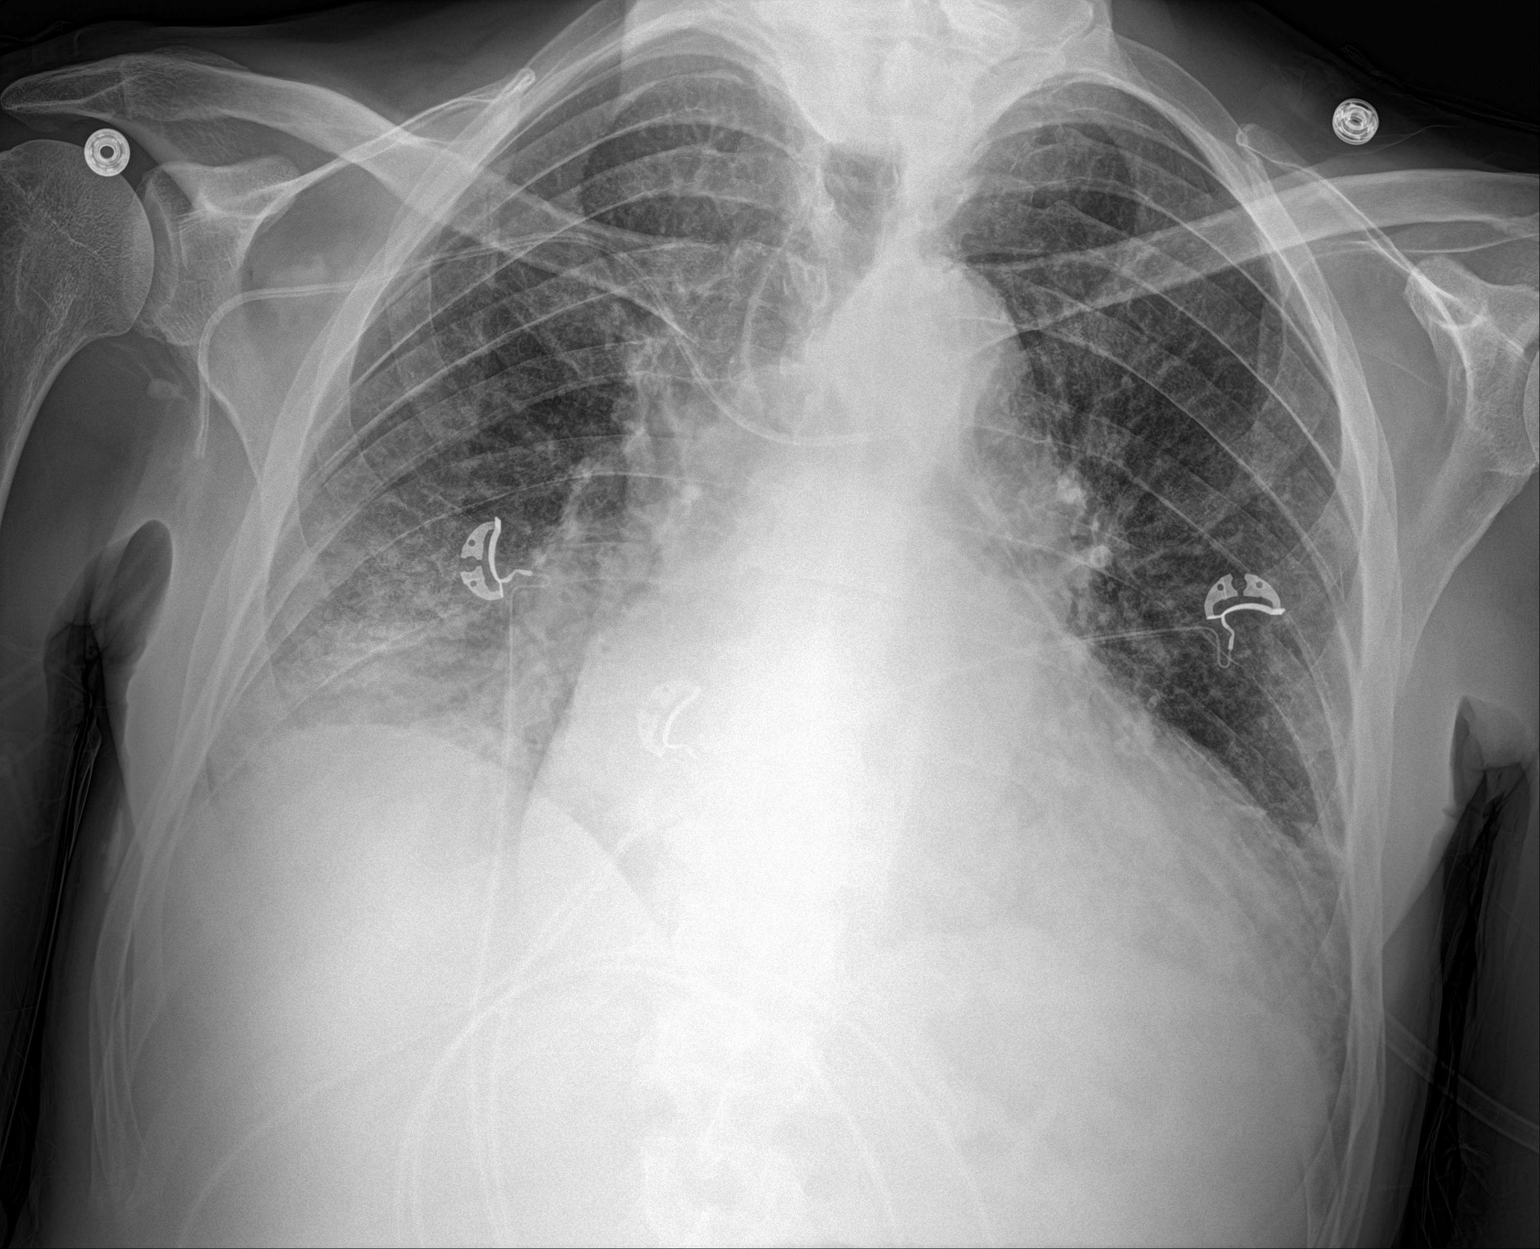

[1 of 1 positions shown; findings below may reference images not displayed]

FINDINGS: Patchy right lower lobe opacity, increased. Mild left basilar
opacity, grossly unchanged.

No frank interstitial edema. No definite pleural effusions. No
pneumothorax.

Right chest port terminating in the left brachiocephalic vein.

Cardiomegaly.
IMPRESSION: Progressive right lower lobe opacity, compatible with pneumonia.
Mild left basilar opacity, possibly atelectasis.

Cardiomegaly.  No frank interstitial edema.

Stable right chest port terminating in the left brachiocephalic
vein.

## 2018-06-08 IMAGING — CT CT CHEST W/O CM
2 of 4 series · 15 of 36 positions shown, 18 images · non-contrast
Comparison: Chest radiograph dated 09/29/2017

CLINICAL DATA: Follow-up pneumonia

EXAM:
CT CHEST WITHOUT CONTRAST
TECHNIQUE: Multidetector CT imaging of the chest was performed following the
standard protocol without IV contrast.

[Series 3: chest wo · axial · 0.74mm/px · z∈[-153,+99]mm · 12 of 150 slices shown, 15 images]
[im 12/150  mediastinal]
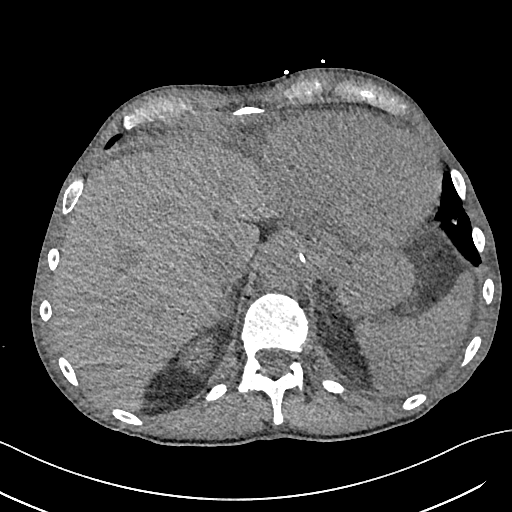
[im 12/150  lung]
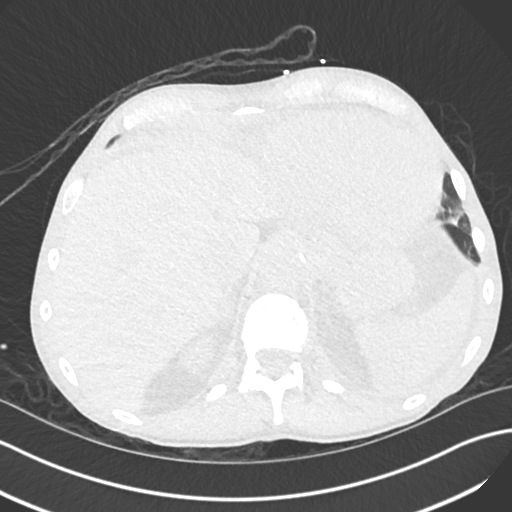
[im 23/150  lung]
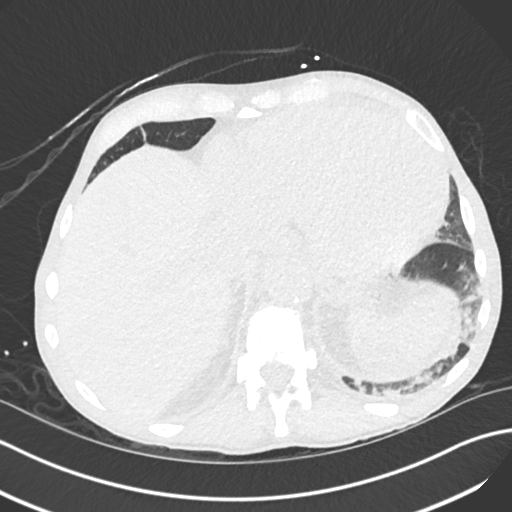
[im 35/150  lung]
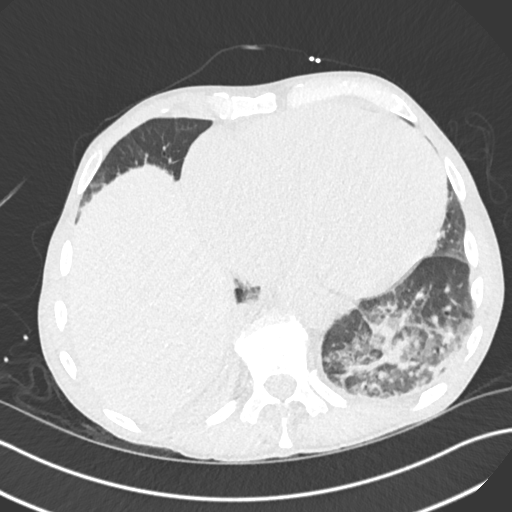
[im 46/150  lung]
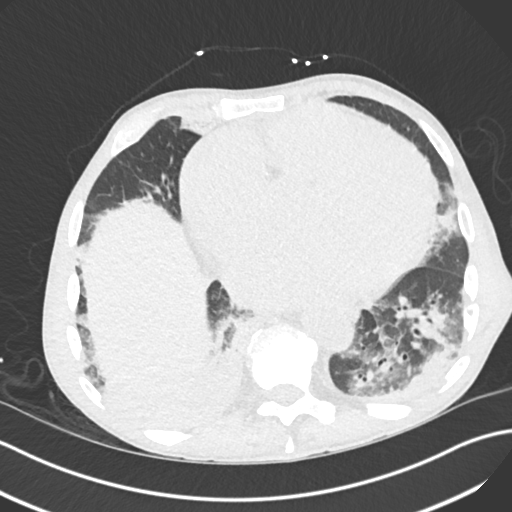
[im 58/150  mediastinal]
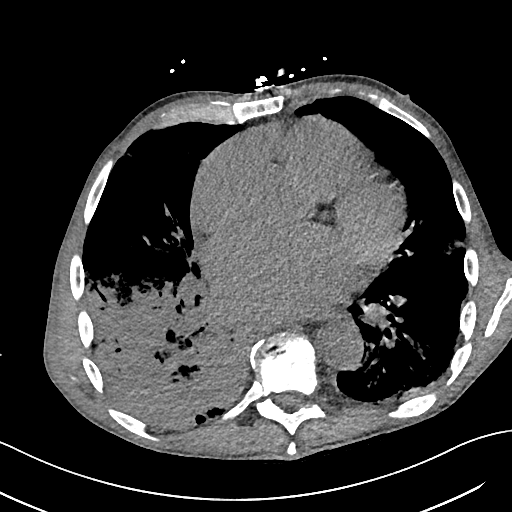
[im 58/150  lung]
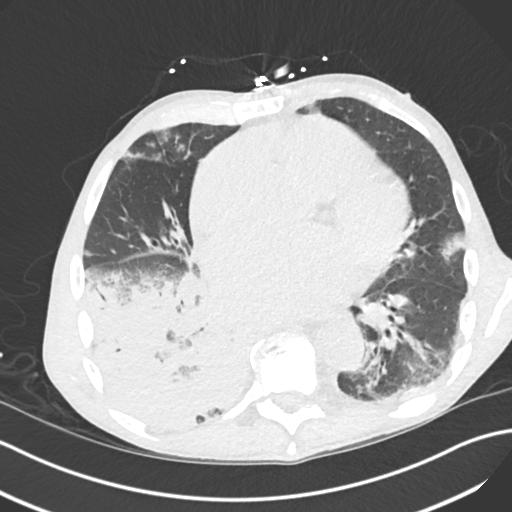
[im 69/150  lung]
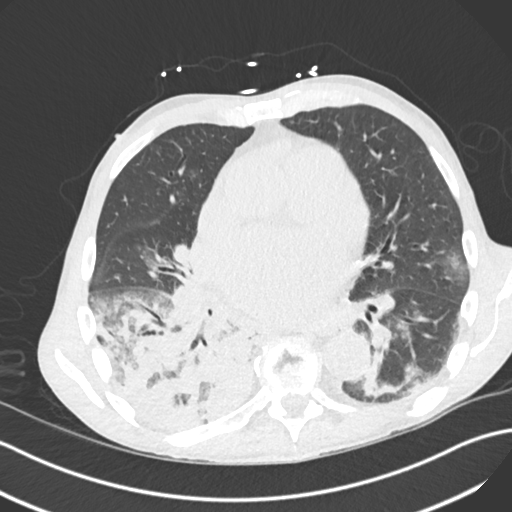
[im 81/150  lung]
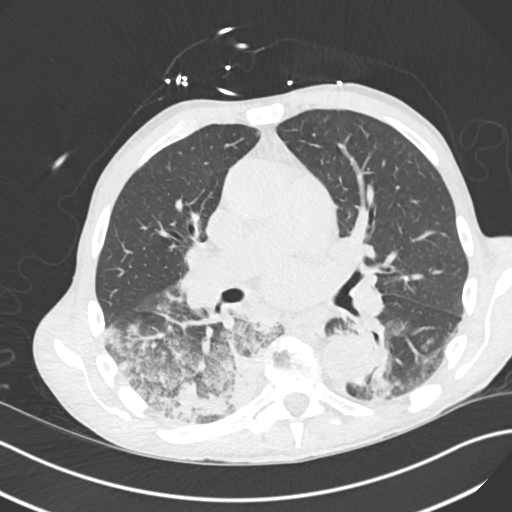
[im 92/150  lung]
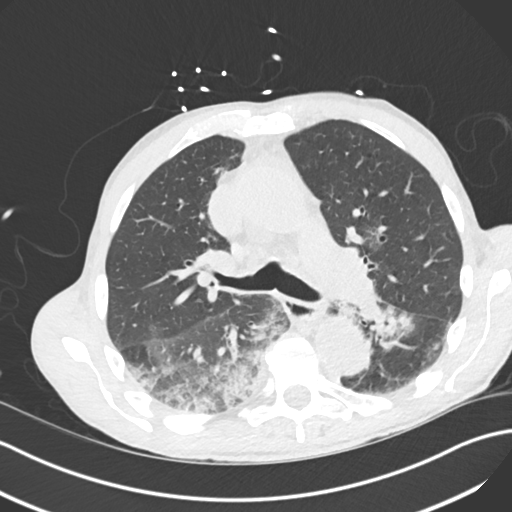
[im 104/150  mediastinal]
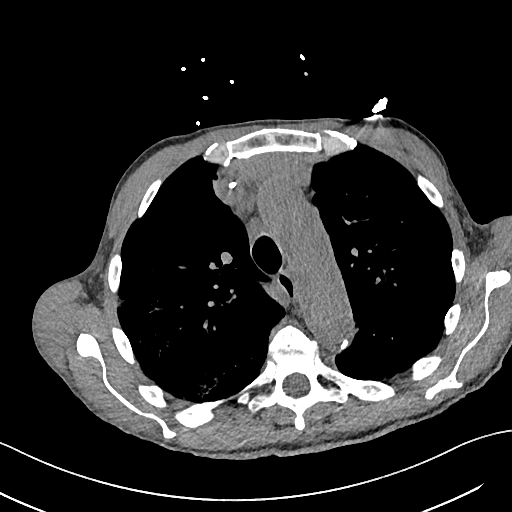
[im 104/150  lung]
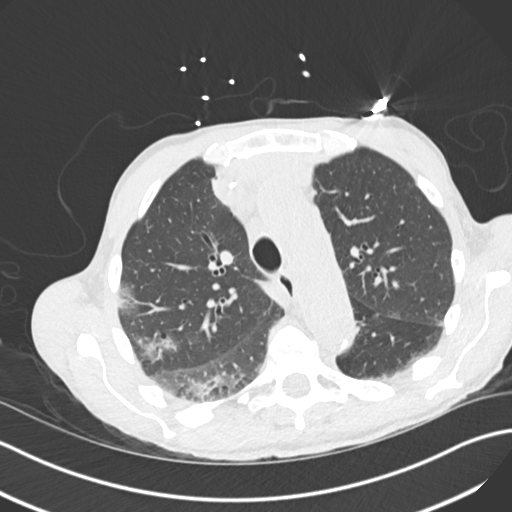
[im 115/150  lung]
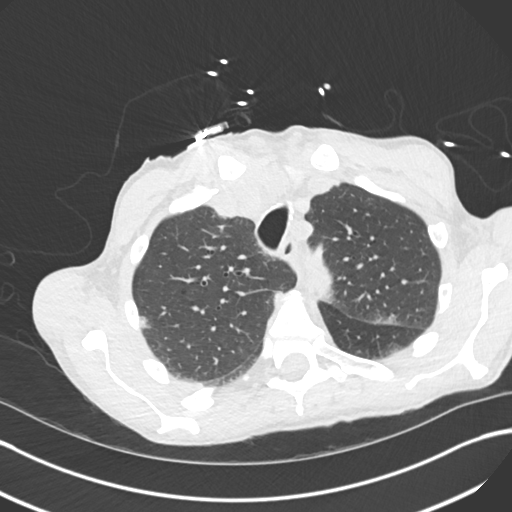
[im 127/150  lung]
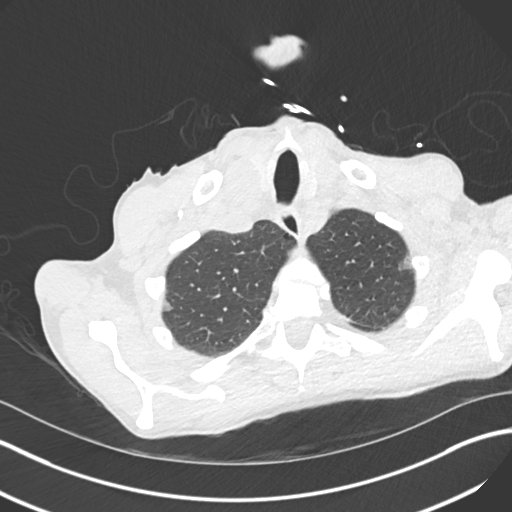
[im 138/150  lung]
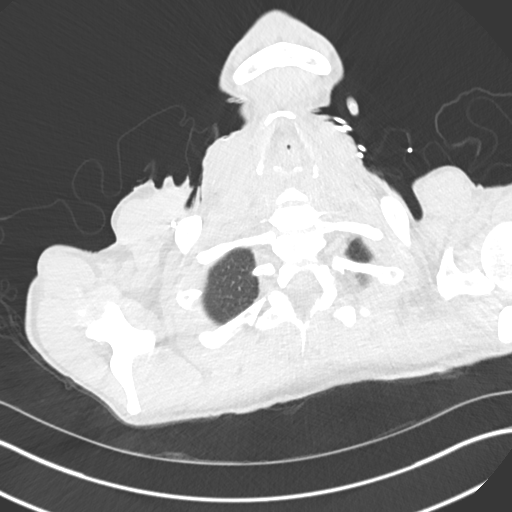

[Series 6: cor · coronal · 0.59mm/px · 3 of 150 slices shown]
[im 30/150  lung]
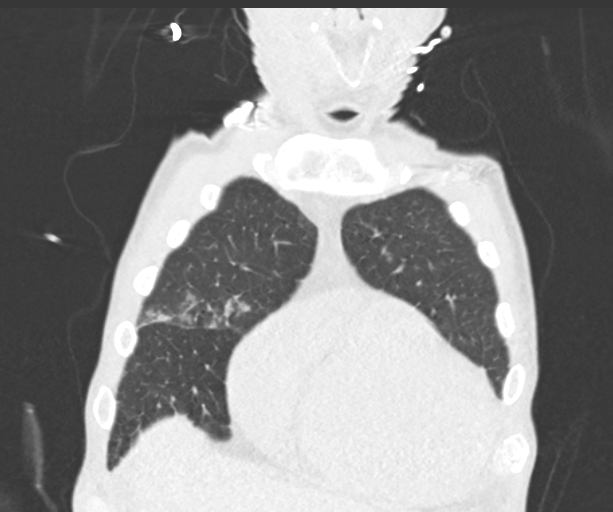
[im 60/150  lung]
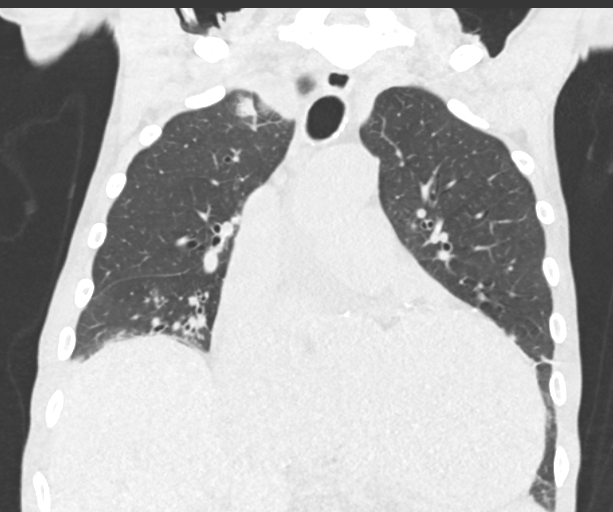
[im 90/150  lung]
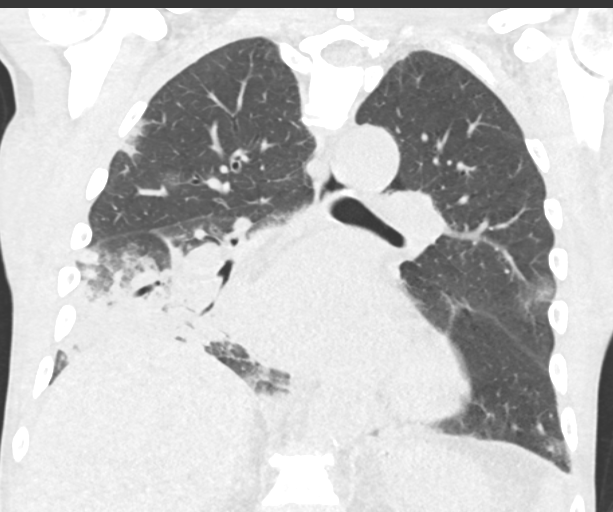

[15 of 36 positions shown; findings below may reference images not displayed]

FINDINGS: Cardiovascular: Mild cardiomegaly.  No pericardial effusion.

No evidence of thoracic aortic aneurysm. Mild atherosclerotic
calcifications aortic arch.

Coronary atherosclerosis the LAD.

Right chest port terminates in the left brachiocephalic vein (series
3/image 52).

Mediastinum/Nodes: Small mediastinal lymph nodes, including a
dominant 12 mm high right paratracheal node at the thoracic inlet
(series 3/image 30), likely reactive.

Visualized thyroid is unremarkable.

Lungs/Pleura: Right lower lobe consolidation. Additional
patchy/ground-glass opacities all lobes, left lower lobe
predominant. This appearance is compatible with multifocal
pneumonia.

No definite pleural effusions.

No pneumothorax.

Upper Abdomen: Visualized upper abdomen is notable for trace
perihepatic ascites.

Musculoskeletal: Exaggerated thoracic kyphosis with degenerative
changes of the thoracolumbar spine.
IMPRESSION: Multifocal pneumonia, lower lobe predominant, right greater than
left.

Aortic Atherosclerosis (V14ZB-N28.8).
